# Patient Record
Sex: Female | Born: 1970 | Race: Black or African American | Hispanic: No | Marital: Single | Smoking: Current some day smoker
Health system: Southern US, Community
[De-identification: ages and names within clinical notes are randomized; demographics above are authoritative.]

## PROBLEM LIST (undated history)

## (undated) DIAGNOSIS — I340 Nonrheumatic mitral (valve) insufficiency: Secondary | ICD-10-CM

## (undated) DIAGNOSIS — I428 Other cardiomyopathies: Secondary | ICD-10-CM

## (undated) DIAGNOSIS — I5022 Chronic systolic (congestive) heart failure: Secondary | ICD-10-CM

## (undated) DIAGNOSIS — I671 Cerebral aneurysm, nonruptured: Secondary | ICD-10-CM

## (undated) DIAGNOSIS — I5020 Unspecified systolic (congestive) heart failure: Secondary | ICD-10-CM

## (undated) DIAGNOSIS — I10 Essential (primary) hypertension: Secondary | ICD-10-CM

## (undated) DIAGNOSIS — Z720 Tobacco use: Secondary | ICD-10-CM

## (undated) DIAGNOSIS — F32A Depression, unspecified: Secondary | ICD-10-CM

## (undated) DIAGNOSIS — I480 Paroxysmal atrial fibrillation: Secondary | ICD-10-CM

## (undated) HISTORY — DX: Essential (primary) hypertension: I10

---

## 1998-04-11 ENCOUNTER — Emergency Department (HOSPITAL_COMMUNITY): Admission: EM | Admit: 1998-04-11 | Discharge: 1998-04-11 | Payer: Self-pay | Admitting: Family Medicine

## 2006-11-24 ENCOUNTER — Emergency Department: Payer: Self-pay | Admitting: General Practice

## 2007-10-01 ENCOUNTER — Emergency Department: Payer: Self-pay | Admitting: Emergency Medicine

## 2007-12-16 ENCOUNTER — Emergency Department: Payer: Self-pay | Admitting: Emergency Medicine

## 2008-09-27 ENCOUNTER — Encounter: Payer: Self-pay | Admitting: Obstetrics and Gynecology

## 2008-11-01 ENCOUNTER — Encounter: Payer: Self-pay | Admitting: Obstetrics and Gynecology

## 2008-12-27 ENCOUNTER — Inpatient Hospital Stay: Payer: Self-pay

## 2009-02-15 ENCOUNTER — Emergency Department: Payer: Self-pay | Admitting: Unknown Physician Specialty

## 2009-02-16 ENCOUNTER — Emergency Department: Payer: Self-pay | Admitting: Emergency Medicine

## 2010-01-20 ENCOUNTER — Emergency Department: Payer: Self-pay | Admitting: Internal Medicine

## 2010-06-18 ENCOUNTER — Emergency Department: Payer: Self-pay | Admitting: Emergency Medicine

## 2010-11-24 IMAGING — US US OB DETAIL+14 WK - NRPT [facility]
1 series · 14 of 28 positions shown · non-contrast
Comparison: none

[Series 1: us ob detail+14 wk - nrpt [facility] · 14 of 58 slices shown]
[im 3/58]
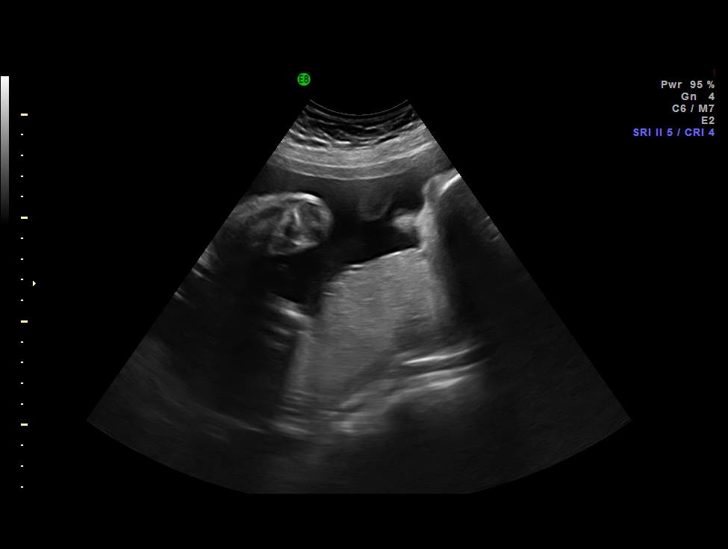
[im 7/58]
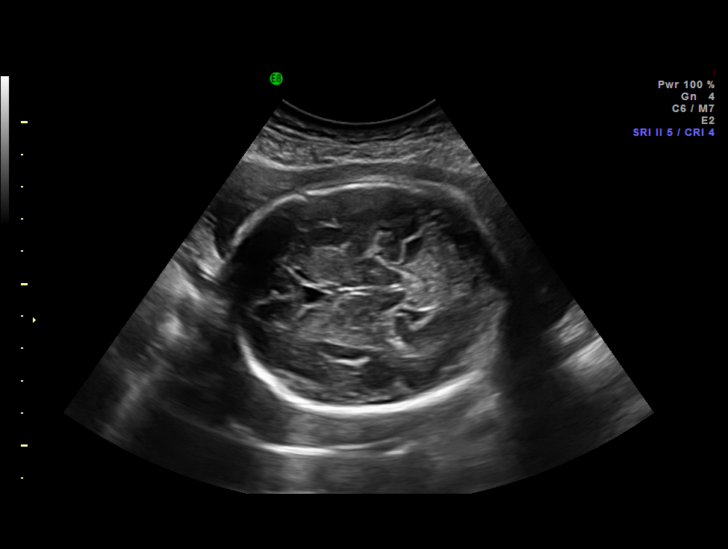
[im 11/58]
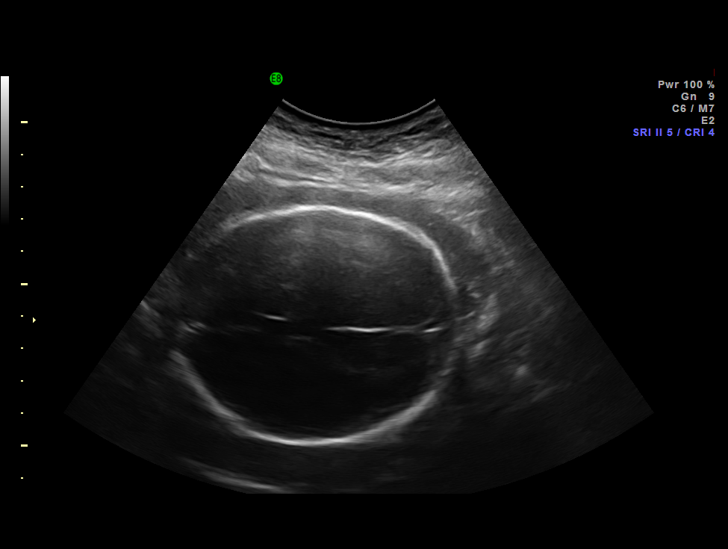
[im 15/58]
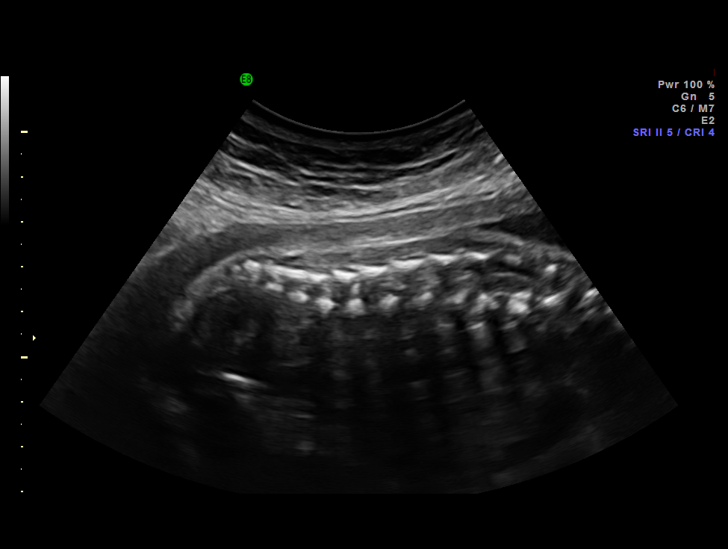
[im 20/58]
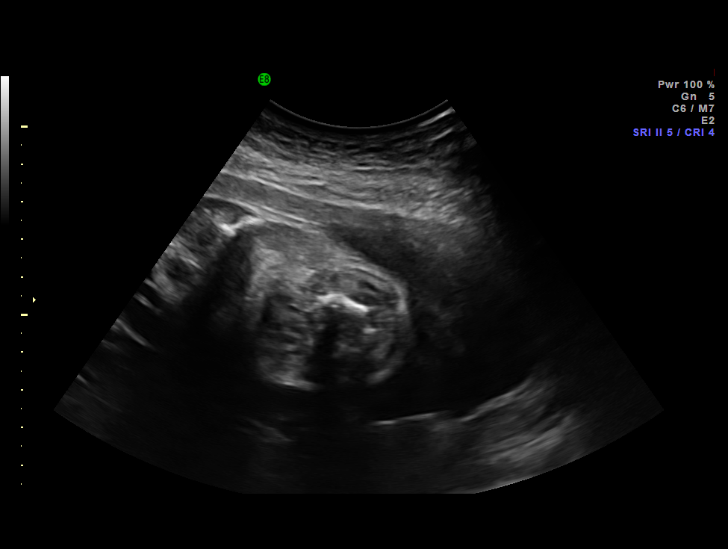
[im 24/58]
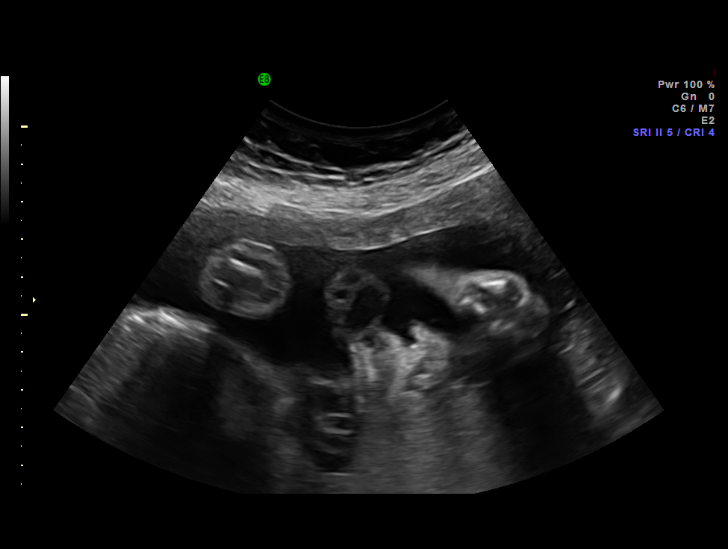
[im 28/58]
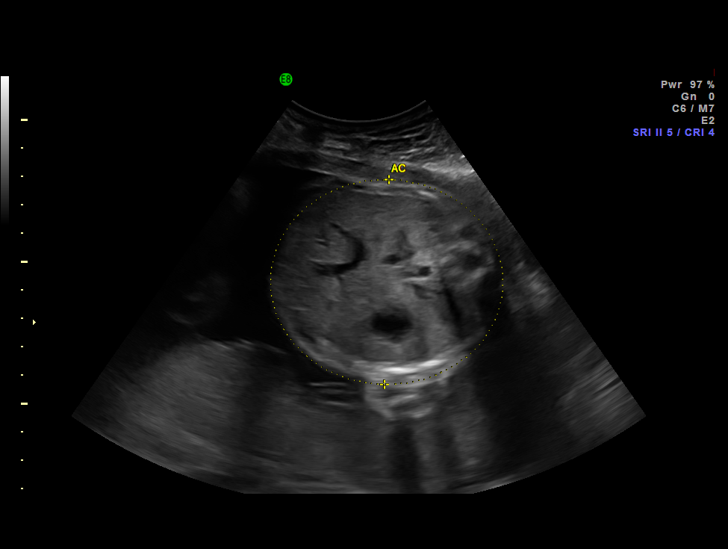
[im 32/58]
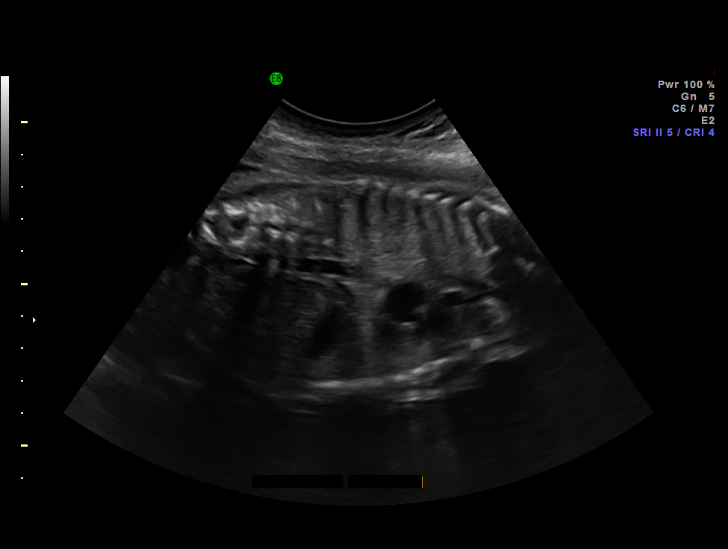
[im 36/58]
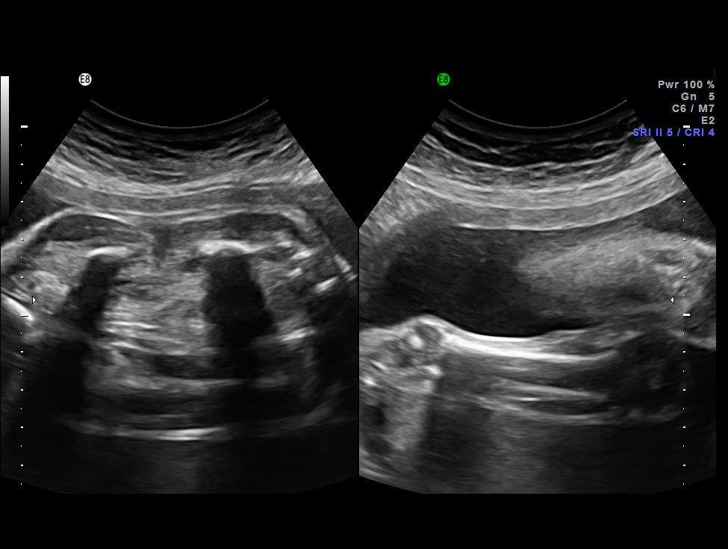
[im 41/58]
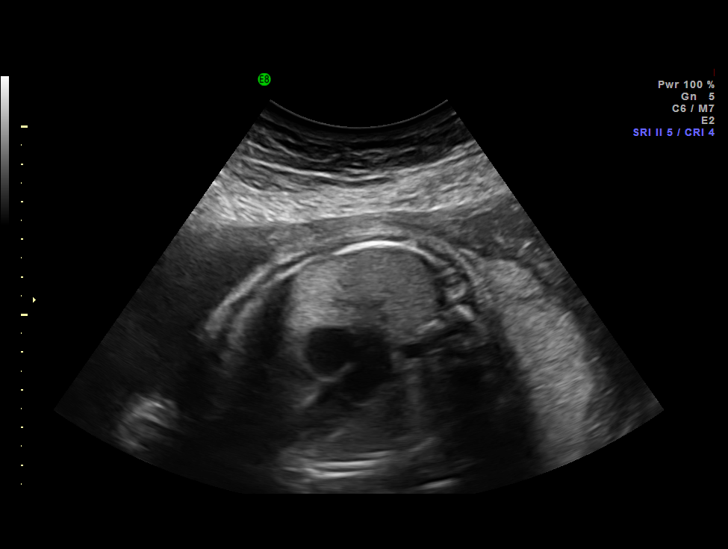
[im 45/58]
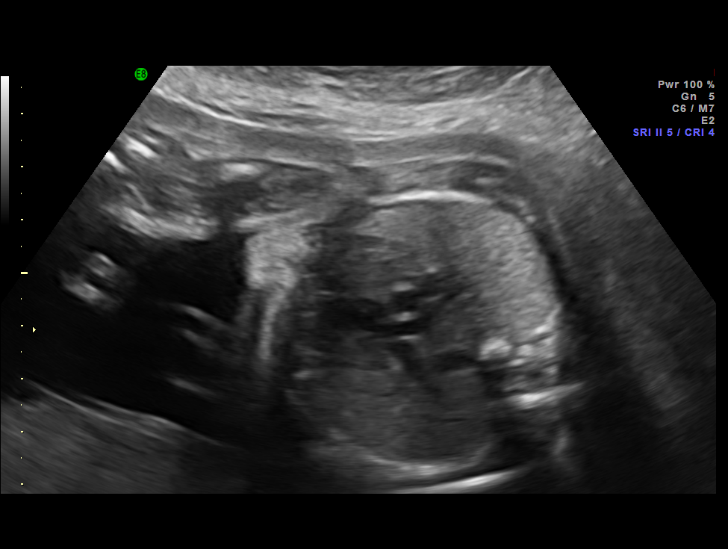
[im 49/58]
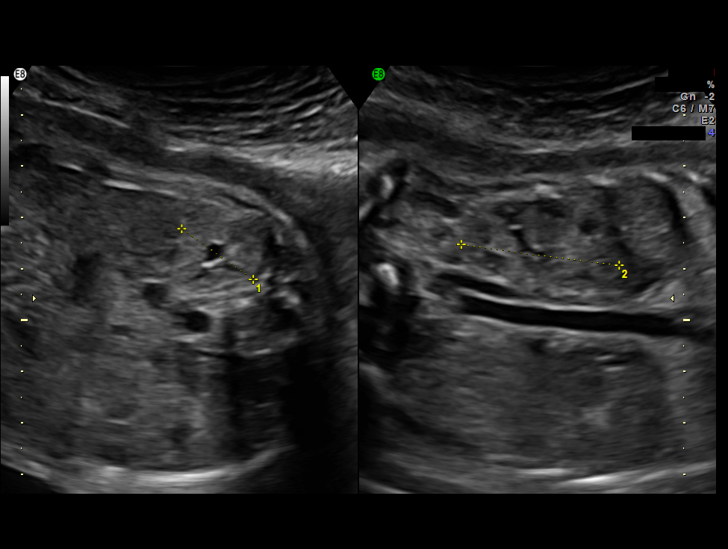
[im 53/58]
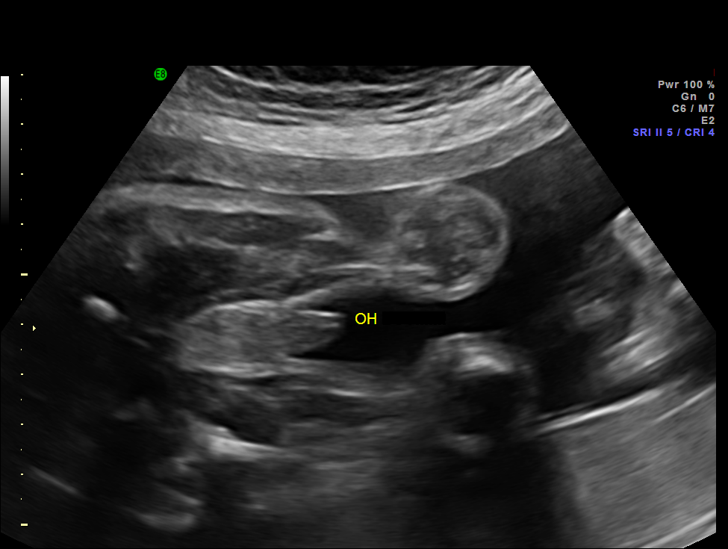
[im 58/58]
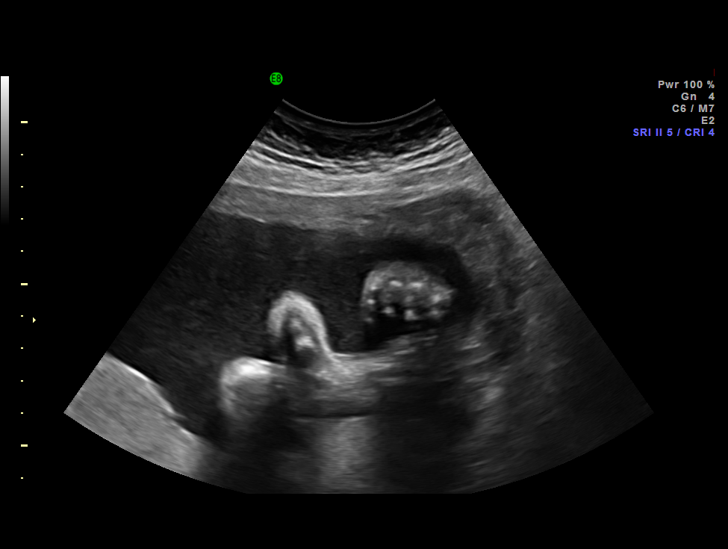

[14 of 28 positions shown; findings below may reference images not displayed]

IMAGES IMPORTED FROM THE SYNGO WORKFLOW SYSTEM
NO DICTATION FOR STUDY

## 2011-02-18 ENCOUNTER — Emergency Department: Payer: Self-pay | Admitting: Emergency Medicine

## 2011-06-29 ENCOUNTER — Emergency Department: Payer: Self-pay | Admitting: Internal Medicine

## 2011-09-02 ENCOUNTER — Emergency Department: Payer: Self-pay | Admitting: Internal Medicine

## 2011-09-02 LAB — COMPREHENSIVE METABOLIC PANEL
Albumin: 2.5 g/dL — ABNORMAL LOW (ref 3.4–5.0)
Alkaline Phosphatase: 41 U/L — ABNORMAL LOW (ref 50–136)
Anion Gap: 11 (ref 7–16)
BUN: 7 mg/dL (ref 7–18)
Bilirubin,Total: 0.2 mg/dL (ref 0.2–1.0)
Calcium, Total: 7.9 mg/dL — ABNORMAL LOW (ref 8.5–10.1)
Chloride: 105 mmol/L (ref 98–107)
Co2: 25 mmol/L (ref 21–32)
Creatinine: 0.5 mg/dL — ABNORMAL LOW (ref 0.60–1.30)
EGFR (African American): >60
EGFR (Non-African Amer.): >60
Glucose: 71 mg/dL (ref 65–99)
Osmolality: 278 (ref 275–301)
Potassium: 3.4 mmol/L — ABNORMAL LOW (ref 3.5–5.1)
SGOT(AST): 12 U/L — ABNORMAL LOW (ref 15–37)
SGPT (ALT): 16 U/L
Sodium: 141 mmol/L (ref 136–145)
Total Protein: 6.6 g/dL (ref 6.4–8.2)

## 2011-09-02 LAB — URINALYSIS, COMPLETE
Bilirubin,UR: NEGATIVE
Blood: NEGATIVE
Glucose,UR: NEGATIVE mg/dL (ref 0–75)
Ketone: NEGATIVE
Leukocyte Esterase: NEGATIVE
Nitrite: NEGATIVE
Ph: 6 (ref 4.5–8.0)
Protein: NEGATIVE
RBC,UR: 1 /HPF (ref 0–5)
Specific Gravity: 1.02 (ref 1.003–1.030)
Squamous Epithelial: 2
WBC UR: 1 /HPF (ref 0–5)

## 2011-09-02 LAB — CBC
HCT: 34.2 % — ABNORMAL LOW (ref 35.0–47.0)
HGB: 11.2 g/dL — ABNORMAL LOW (ref 12.0–16.0)
MCH: 28.6 pg (ref 26.0–34.0)
MCHC: 32.8 g/dL (ref 32.0–36.0)
MCV: 87 fL (ref 80–100)
Platelet: 168 10*3/uL (ref 150–440)
RBC: 3.92 10*6/uL (ref 3.80–5.20)
RDW: 14.8 % — ABNORMAL HIGH (ref 11.5–14.5)
WBC: 8.7 10*3/uL (ref 3.6–11.0)

## 2011-09-02 LAB — HCG, QUANTITATIVE, PREGNANCY: Beta Hcg, Quant.: 54617 m[IU]/mL — ABNORMAL HIGH

## 2011-10-11 ENCOUNTER — Encounter: Payer: Self-pay | Admitting: Maternal and Fetal Medicine

## 2012-01-11 ENCOUNTER — Observation Stay: Payer: Self-pay | Admitting: Obstetrics and Gynecology

## 2012-01-11 LAB — URINALYSIS, COMPLETE
Bacteria: NONE SEEN
Bilirubin,UR: NEGATIVE
Blood: NEGATIVE
Glucose,UR: NEGATIVE mg/dL (ref 0–75)
Ketone: NEGATIVE
Leukocyte Esterase: NEGATIVE
Nitrite: NEGATIVE
Ph: 6 (ref 4.5–8.0)
Protein: NEGATIVE
RBC,UR: <1 /HPF (ref 0–5)
Specific Gravity: 1.021 (ref 1.003–1.030)
Squamous Epithelial: 2
WBC UR: 1 /HPF (ref 0–5)

## 2012-01-11 LAB — DRUG SCREEN, URINE

## 2012-02-04 ENCOUNTER — Observation Stay: Payer: Self-pay

## 2012-02-04 LAB — PIH PROFILE
Anion Gap: 11 (ref 7–16)
BUN: 7 mg/dL (ref 7–18)
Calcium, Total: 8.1 mg/dL — ABNORMAL LOW (ref 8.5–10.1)
Chloride: 105 mmol/L (ref 98–107)
Co2: 22 mmol/L (ref 21–32)
Creatinine: 0.67 mg/dL (ref 0.60–1.30)
EGFR (African American): >60
EGFR (Non-African Amer.): >60
Glucose: 147 mg/dL — ABNORMAL HIGH (ref 65–99)
HCT: 34.3 % — ABNORMAL LOW (ref 35.0–47.0)
HGB: 11.3 g/dL — ABNORMAL LOW (ref 12.0–16.0)
MCH: 28.9 pg (ref 26.0–34.0)
MCHC: 32.9 g/dL (ref 32.0–36.0)
MCV: 88 fL (ref 80–100)
Osmolality: 276 (ref 275–301)
Platelet: 182 10*3/uL (ref 150–440)
Potassium: 3.3 mmol/L — ABNORMAL LOW (ref 3.5–5.1)
RBC: 3.9 10*6/uL (ref 3.80–5.20)
RDW: 15 % — ABNORMAL HIGH (ref 11.5–14.5)
SGOT(AST): 18 U/L (ref 15–37)
Sodium: 138 mmol/L (ref 136–145)
Uric Acid: 2.7 mg/dL (ref 2.6–6.0)
WBC: 9 10*3/uL (ref 3.6–11.0)

## 2012-02-04 LAB — PROTEIN / CREATININE RATIO, URINE
Creatinine, Urine: 144.4 mg/dL — ABNORMAL HIGH (ref 30.0–125.0)
Protein, Random Urine: 18 mg/dL — ABNORMAL HIGH (ref 0–12)
Protein/Creat. Ratio: 125 mg/gCREAT (ref 0–200)

## 2012-02-07 ENCOUNTER — Observation Stay: Payer: Self-pay

## 2012-02-26 ENCOUNTER — Inpatient Hospital Stay: Payer: Self-pay

## 2012-02-26 LAB — PIH PROFILE
Anion Gap: 7 (ref 7–16)
BUN: 6 mg/dL — ABNORMAL LOW (ref 7–18)
Calcium, Total: 8.2 mg/dL — ABNORMAL LOW (ref 8.5–10.1)
Chloride: 107 mmol/L (ref 98–107)
Co2: 24 mmol/L (ref 21–32)
Creatinine: 0.5 mg/dL — ABNORMAL LOW (ref 0.60–1.30)
EGFR (African American): >60
EGFR (Non-African Amer.): >60
Glucose: 62 mg/dL — ABNORMAL LOW (ref 65–99)
HCT: 35.6 % (ref 35.0–47.0)
HGB: 11.8 g/dL — ABNORMAL LOW (ref 12.0–16.0)
MCH: 29.2 pg (ref 26.0–34.0)
MCHC: 33.2 g/dL (ref 32.0–36.0)
MCV: 88 fL (ref 80–100)
Osmolality: 271 (ref 275–301)
Platelet: 193 10*3/uL (ref 150–440)
Potassium: 3.8 mmol/L (ref 3.5–5.1)
RBC: 4.05 10*6/uL (ref 3.80–5.20)
RDW: 15.2 % — ABNORMAL HIGH (ref 11.5–14.5)
SGOT(AST): 16 U/L (ref 15–37)
Sodium: 138 mmol/L (ref 136–145)
Uric Acid: 3.1 mg/dL (ref 2.6–6.0)
WBC: 9.3 10*3/uL (ref 3.6–11.0)

## 2012-02-26 LAB — PROTEIN / CREATININE RATIO, URINE
Creatinine, Urine: 99.1 mg/dL (ref 30.0–125.0)
Protein, Random Urine: 17 mg/dL — ABNORMAL HIGH (ref 0–12)
Protein/Creat. Ratio: 172 mg/gCREAT (ref 0–200)

## 2012-02-28 LAB — HEMATOCRIT: HCT: 31.8 % — ABNORMAL LOW (ref 35.0–47.0)

## 2013-02-15 ENCOUNTER — Emergency Department: Payer: Self-pay | Admitting: Emergency Medicine

## 2013-02-15 LAB — URINALYSIS, COMPLETE
Bacteria: NONE SEEN
Bilirubin,UR: NEGATIVE
Blood: NEGATIVE
Glucose,UR: NEGATIVE mg/dL (ref 0–75)
Ketone: NEGATIVE
Leukocyte Esterase: NEGATIVE
Nitrite: NEGATIVE
Ph: 7 (ref 4.5–8.0)
Protein: 30
RBC,UR: 1 /HPF (ref 0–5)
Specific Gravity: 1.027 (ref 1.003–1.030)
Squamous Epithelial: 7
WBC UR: 1 /HPF (ref 0–5)

## 2013-02-15 LAB — COMPREHENSIVE METABOLIC PANEL
Albumin: 2.7 g/dL — ABNORMAL LOW (ref 3.4–5.0)
Alkaline Phosphatase: 75 U/L (ref 50–136)
Anion Gap: 8 (ref 7–16)
BUN: 11 mg/dL (ref 7–18)
Bilirubin,Total: 0.2 mg/dL (ref 0.2–1.0)
Calcium, Total: 8.5 mg/dL (ref 8.5–10.1)
Chloride: 106 mmol/L (ref 98–107)
Co2: 23 mmol/L (ref 21–32)
Creatinine: 0.65 mg/dL (ref 0.60–1.30)
EGFR (African American): >60
EGFR (Non-African Amer.): >60
Glucose: 110 mg/dL — ABNORMAL HIGH (ref 65–99)
Osmolality: 274 (ref 275–301)
Potassium: 3.7 mmol/L (ref 3.5–5.1)
SGOT(AST): 11 U/L — ABNORMAL LOW (ref 15–37)
SGPT (ALT): 12 U/L (ref 12–78)
Sodium: 137 mmol/L (ref 136–145)
Total Protein: 7.2 g/dL (ref 6.4–8.2)

## 2013-02-15 LAB — CBC
HCT: 35.5 % (ref 35.0–47.0)
HGB: 11.8 g/dL — ABNORMAL LOW (ref 12.0–16.0)
MCH: 28.2 pg (ref 26.0–34.0)
MCHC: 33.1 g/dL (ref 32.0–36.0)
MCV: 85 fL (ref 80–100)
Platelet: 174 10*3/uL (ref 150–440)
RBC: 4.17 10*6/uL (ref 3.80–5.20)
RDW: 15.5 % — ABNORMAL HIGH (ref 11.5–14.5)
WBC: 10.2 10*3/uL (ref 3.6–11.0)

## 2013-02-15 LAB — LIPASE, BLOOD: Lipase: 533 U/L — ABNORMAL HIGH (ref 73–393)

## 2013-05-28 ENCOUNTER — Encounter: Payer: Self-pay | Admitting: Obstetrics & Gynecology

## 2013-06-29 ENCOUNTER — Observation Stay: Payer: Self-pay

## 2013-06-29 LAB — PIH PROFILE
Anion Gap: 9 (ref 7–16)
BUN: 7 mg/dL (ref 7–18)
Calcium, Total: 8.1 mg/dL — ABNORMAL LOW (ref 8.5–10.1)
Chloride: 107 mmol/L (ref 98–107)
Co2: 22 mmol/L (ref 21–32)
Creatinine: 0.56 mg/dL — ABNORMAL LOW (ref 0.60–1.30)
EGFR (African American): >60
EGFR (Non-African Amer.): >60
Glucose: 139 mg/dL — ABNORMAL HIGH (ref 65–99)
HCT: 32.2 % — ABNORMAL LOW (ref 35.0–47.0)
HGB: 10.7 g/dL — ABNORMAL LOW (ref 12.0–16.0)
MCH: 27.8 pg (ref 26.0–34.0)
MCHC: 33.2 g/dL (ref 32.0–36.0)
MCV: 84 fL (ref 80–100)
Osmolality: 276 (ref 275–301)
Platelet: 139 10*3/uL — ABNORMAL LOW (ref 150–440)
Potassium: 3.5 mmol/L (ref 3.5–5.1)
RBC: 3.84 10*6/uL (ref 3.80–5.20)
RDW: 15 % — ABNORMAL HIGH (ref 11.5–14.5)
SGOT(AST): 13 U/L — ABNORMAL LOW (ref 15–37)
Sodium: 138 mmol/L (ref 136–145)
Uric Acid: 3.3 mg/dL (ref 2.6–6.0)
WBC: 9.4 10*3/uL (ref 3.6–11.0)

## 2013-06-29 LAB — PROTEIN / CREATININE RATIO, URINE
Creatinine, Urine: 91.3 mg/dL (ref 30.0–125.0)
Protein, Random Urine: 11 mg/dL (ref 0–12)
Protein/Creat. Ratio: 120 mg/gCREAT (ref 0–200)

## 2013-07-16 ENCOUNTER — Inpatient Hospital Stay: Payer: Self-pay | Admitting: Obstetrics and Gynecology

## 2013-07-16 LAB — PIH PROFILE
Anion Gap: 4 — ABNORMAL LOW (ref 7–16)
BUN: 8 mg/dL (ref 7–18)
Calcium, Total: 8.4 mg/dL — ABNORMAL LOW (ref 8.5–10.1)
Chloride: 107 mmol/L (ref 98–107)
Co2: 25 mmol/L (ref 21–32)
Creatinine: 0.7 mg/dL (ref 0.60–1.30)
EGFR (African American): >60
EGFR (Non-African Amer.): >60
Glucose: 101 mg/dL — ABNORMAL HIGH (ref 65–99)
HCT: 32.8 % — ABNORMAL LOW (ref 35.0–47.0)
HGB: 11 g/dL — ABNORMAL LOW (ref 12.0–16.0)
MCH: 27.4 pg (ref 26.0–34.0)
MCHC: 33.5 g/dL (ref 32.0–36.0)
MCV: 82 fL (ref 80–100)
Osmolality: 270 (ref 275–301)
Platelet: 155 10*3/uL (ref 150–440)
Potassium: 3.8 mmol/L (ref 3.5–5.1)
RBC: 4.01 10*6/uL (ref 3.80–5.20)
RDW: 14.8 % — ABNORMAL HIGH (ref 11.5–14.5)
SGOT(AST): 14 U/L — ABNORMAL LOW (ref 15–37)
Sodium: 136 mmol/L (ref 136–145)
Uric Acid: 3.2 mg/dL (ref 2.6–6.0)
WBC: 10.7 10*3/uL (ref 3.6–11.0)

## 2013-07-16 LAB — PROTEIN / CREATININE RATIO, URINE
Creatinine, Urine: 101.1 mg/dL (ref 30.0–125.0)
Protein, Random Urine: 19 mg/dL — ABNORMAL HIGH (ref 0–12)
Protein/Creat. Ratio: 188 mg/gCREAT (ref 0–200)

## 2013-07-17 LAB — CBC WITH DIFFERENTIAL/PLATELET
Basophil #: 0.1 10*3/uL (ref 0.0–0.1)
Basophil #: 0.1 10*3/uL (ref 0.0–0.1)
Basophil %: 0.8 %
Basophil %: 0.4 %
Eosinophil #: 0.1 10*3/uL (ref 0.0–0.7)
Eosinophil #: 0 10*3/uL (ref 0.0–0.7)
Eosinophil %: 1.1 %
Eosinophil %: 0.3 %
HCT: 34.4 % — ABNORMAL LOW (ref 35.0–47.0)
HCT: 34.7 % — ABNORMAL LOW (ref 35.0–47.0)
HGB: 11.2 g/dL — ABNORMAL LOW (ref 12.0–16.0)
HGB: 11.2 g/dL — ABNORMAL LOW (ref 12.0–16.0)
Lymphocyte #: 2.7 10*3/uL (ref 1.0–3.6)
Lymphocyte #: 1.6 10*3/uL (ref 1.0–3.6)
Lymphocyte %: 21.9 %
Lymphocyte %: 10.4 %
MCH: 26.7 pg (ref 26.0–34.0)
MCH: 26.7 pg (ref 26.0–34.0)
MCHC: 32.4 g/dL (ref 32.0–36.0)
MCHC: 32.3 g/dL (ref 32.0–36.0)
MCV: 83 fL (ref 80–100)
MCV: 83 fL (ref 80–100)
Monocyte #: 1 x10 3/mm — ABNORMAL HIGH (ref 0.2–0.9)
Monocyte #: 1.1 x10 3/mm — ABNORMAL HIGH (ref 0.2–0.9)
Monocyte %: 8.4 %
Monocyte %: 7.1 %
Neutrophil #: 8.3 10*3/uL — ABNORMAL HIGH (ref 1.4–6.5)
Neutrophil #: 12.6 10*3/uL — ABNORMAL HIGH (ref 1.4–6.5)
Neutrophil %: 67.8 %
Neutrophil %: 81.8 %
Platelet: 156 10*3/uL (ref 150–440)
Platelet: 157 10*3/uL (ref 150–440)
RBC: 4.18 10*6/uL (ref 3.80–5.20)
RBC: 4.19 10*6/uL (ref 3.80–5.20)
RDW: 15.1 % — ABNORMAL HIGH (ref 11.5–14.5)
RDW: 15 % — ABNORMAL HIGH (ref 11.5–14.5)
WBC: 12.2 10*3/uL — ABNORMAL HIGH (ref 3.6–11.0)
WBC: 15.4 10*3/uL — ABNORMAL HIGH (ref 3.6–11.0)

## 2013-07-18 LAB — HEMOGLOBIN: HGB: 9.8 g/dL — ABNORMAL LOW (ref 12.0–16.0)

## 2014-08-15 ENCOUNTER — Inpatient Hospital Stay: Payer: Self-pay

## 2014-08-15 LAB — DRUG SCREEN, URINE

## 2014-08-15 LAB — COMPREHENSIVE METABOLIC PANEL
Albumin: 2.2 g/dL — ABNORMAL LOW (ref 3.4–5.0)
Alkaline Phosphatase: 255 U/L — ABNORMAL HIGH
Anion Gap: 11 (ref 7–16)
BUN: 10 mg/dL (ref 7–18)
Bilirubin,Total: 0.3 mg/dL (ref 0.2–1.0)
Calcium, Total: 9.5 mg/dL (ref 8.5–10.1)
Chloride: 102 mmol/L (ref 98–107)
Co2: 23 mmol/L (ref 21–32)
Creatinine: 0.8 mg/dL (ref 0.60–1.30)
EGFR (African American): >60
EGFR (Non-African Amer.): >60
Osmolality: 271 (ref 275–301)
Potassium: 3.8 mmol/L (ref 3.5–5.1)
SGOT(AST): 21 U/L (ref 15–37)
SGPT (ALT): 18 U/L
Sodium: 136 mmol/L (ref 136–145)
Total Protein: 7.1 g/dL (ref 6.4–8.2)

## 2014-08-15 LAB — URINALYSIS, COMPLETE
Bilirubin,UR: NEGATIVE
Blood: NEGATIVE
Glucose,UR: NEGATIVE mg/dL (ref 0–75)
Ketone: NEGATIVE
Leukocyte Esterase: NEGATIVE
Nitrite: NEGATIVE
Ph: 5 (ref 4.5–8.0)
Protein: NEGATIVE
RBC,UR: 1086 /HPF (ref 0–5)
Specific Gravity: 1.017 (ref 1.003–1.030)
Squamous Epithelial: 1
WBC UR: 1 /HPF (ref 0–5)

## 2014-08-15 LAB — PRENATAL PANEL
ABO/RH(D): AB POS
Antibody Screen: NEGATIVE
Glucose: 103 mg/dL — ABNORMAL HIGH (ref 65–99)
HCT: 38.9 % (ref 35.0–47.0)
HGB: 12.3 g/dL (ref 12.0–16.0)
MCH: 27 pg (ref 26.0–34.0)
MCHC: 31.6 g/dL — ABNORMAL LOW (ref 32.0–36.0)
MCV: 85 fL (ref 80–100)
Platelet: 189 10*3/uL (ref 150–440)
RBC: 4.56 10*6/uL (ref 3.80–5.20)
RDW: 14.4 % (ref 11.5–14.5)
WBC: 13.5 10*3/uL — ABNORMAL HIGH (ref 3.6–11.0)

## 2014-08-15 LAB — DIFFERENTIAL
Basophil #: 0.1 10*3/uL (ref 0.0–0.1)
Basophil %: 0.7 %
Eosinophil #: 0.1 10*3/uL (ref 0.0–0.7)
Eosinophil %: 1 %
Lymphocyte #: 3.3 10*3/uL (ref 1.0–3.6)
Lymphocyte %: 24.7 %
Monocyte #: 1.1 x10 3/mm — ABNORMAL HIGH (ref 0.2–0.9)
Monocyte %: 7.9 %
Neutrophil #: 8.8 10*3/uL — ABNORMAL HIGH (ref 1.4–6.5)
Neutrophil %: 65.7 %

## 2014-08-15 LAB — PROTEIN / CREATININE RATIO, URINE
Creatinine, Urine: 193.8 mg/dL — ABNORMAL HIGH (ref 30.0–125.0)
Protein, Random Urine: 235 mg/dL — ABNORMAL HIGH (ref 0–12)
Protein/Creat. Ratio: 1213 mg/gCREAT — ABNORMAL HIGH (ref 0–200)

## 2014-08-16 LAB — HEMATOCRIT: HCT: 35.8 % (ref 35.0–47.0)

## 2014-08-16 LAB — RAPID HIV SCREEN (HIV 1/2 AB+AG)

## 2014-08-16 LAB — URINE CULTURE

## 2014-08-16 LAB — GC/CHLAMYDIA PROBE AMP

## 2014-11-15 LAB — SURGICAL PATHOLOGY

## 2014-11-30 NOTE — H&P (Signed)
L&D Evaluation:  History:   HPI 44 yo [id number] with c/o "back pain and UC's becoming uncomfortable", NO ROM, VB, Decreased FM or any other S/S/. PNC at [facility] for AMA, h/o polysubstance abuse, PICA, Smoker, Late PNC, Obesity, h/o depression, genital HSV, 1st trimester spotting, Lt Bartholins cyst. LMP of 05/30/11 & EDd of 03/05/12.1 child died of SIDS.    Presents with back pain, contractions    Patient's Medical History Asthma  Depression, Genital HSV,    Medications Pre Natal Vitamins    Allergies PCN    Social History tobacco  drugs    Family History Non-Contributory   ROS:   ROS All systems were reviewed.  HEENT, CNS, GI, GU, Respiratory, CV, Renal and Musculoskeletal systems were found to be normal.   Exam:   Vital Signs stable    General no apparent distress    Mental Status clear    Chest clear    Heart normal sinus rhythm, no murmur/gallop/rubs    Abdomen gravid, non-tender    Estimated Fetal Weight Average for gestational age    Back no CVAT    Edema 1+    Reflexes 1+    Mebranes Intact    FHT normal rate with no decels, reactive NST    Ucx absent    Skin dry    Lymph no lymphadenopathy    Other Awake but, slightly inappropriate in affect. Smiling and sleeping, formerly was snoring when entering the room.   Impression:   Impression reactive NST, Eval for labor   Plan:   Plan UA, monitor contractions and for cervical change, Will dc home if urine normal    Comments UDS ordered due to affect. Antic Anti-viral at 36 weeks daily for HSV.   Electronic Signatures: Sharee PimpleJones, Caron W (CNM)  (Signed 21-Jun-13 08:58)  Authored: L&D Evaluation   Last Updated: 21-Jun-13 08:58 by Sharee PimpleJones, Caron W (CNM)

## 2014-11-30 NOTE — H&P (Signed)
L&D Evaluation:  History:  HPI 44 yo [id number] (1 died of SIDS) with PNC at [facility] significant for Grand Multip wtih elevated BP's today and sent from clinic. Initial BP in [facility] was 149-162/95 and down to a low of 130/77 to 143/78. No HA today or blurred vision, RUQ pain even though she had a HA this week.. No ROM, VB or decreased FM. [facility] No VB, decreased FM, ROM or UC's noted today. Pt is a tobacco smoker.   Presents with PIH today   Patient's Medical History ASCUS pap, choledocholithiasis w/sone in billiary tree [facility]   Medications Pre Natal Vitamins   Allergies NKDA   Social History tobacco   Family History Non-Contributory   ROS:  ROS All systems were reviewed.  HEENT, CNS, GI, GU, Respiratory, CV, Renal and Musculoskeletal systems were found to be normal.   Exam:  Vital Signs stable  BP >140/90   General no apparent distress   Mental Status clear   Chest clear   Heart normal sinus rhythm, no murmur/gallop/rubs   Abdomen gravid, non-tender   Estimated Fetal Weight Average for gestational age   Back no CVAT   Edema 1+   Reflexes 1+   Clonus negative   Pelvic not eval   Mebranes Intact   FHT normal rate with no decels, Tachycardic   Ucx absent   Skin dry   Lymph no lymphadenopathy   Impression:  Impression IUP at term for PIH eval   Plan:  Plan EFM/NST, monitor contractions and for cervical change, NST reactive and tachycardic   Electronic Signatures: Sharee PimpleJones, Bular Hickok W (CNM)  (Signed 08-Dec-14 17:54)  Authored: L&D Evaluation   Last Updated: 08-Dec-14 17:54 by Sharee PimpleJones, Cyanna Neace W (CNM)

## 2014-11-30 NOTE — H&P (Signed)
L&D Evaluation:  History:  HPI 44 yo [id number](1 died of SIDS) [facility] with c/o contractions since 8 PM. Denies LOF or VBStates is due tomorrow based on LMP 15 April 15 (It should be 1/20 based on this LMP). Patient has no prenatal care. Past hx  significant for polysubstance abuse, asthma, grand multip, Obesity,  Tobacco abuse, AMA, and PIH. Denies current headache, visual changes, N/V. Past OB HX: 1. 10/24/92 fe 6-8 at 39 weeks 2. 09/21/99 fe 6-13 40 wk 3. 12/02 SAB 4. 12/05/02 female 7-15 40 wks 5. 04/26/06 fe 6-12 40 (died of SIDS) 6. 3/09 SAB 7. 12/27/08 female 8-7 41 wk 8. 02/27/12 female 7-12 PIH 9. 07/17/13 fe 7-7 PIH   Presents with contractions   Patient's Medical History +HSV 2011,   Medications Pre Natal Vitamins   Allergies PCN, hives   Social History tobacco  drugs  Past hx of cocaine, and MJ use   Family History Non-Contributory   ROS:  ROS see HPI   Exam:  Vital Signs 156/98   General no apparent distress, breathing with some contractions   Mental Status clear   Chest clear   Heart normal sinus rhythm, no murmur/gallop/rubs   Abdomen gravid, tender with contractions   Estimated Fetal Weight Average for gestational age   Fetal Position OP   Edema no edema   Reflexes 1+   Pelvic no external lesions, 8-9/80%/-1   Mebranes Intact   FHT 155 with mod variability ?occ variable decel   Ucx q2-5 min apart   Skin dry   Other US done cephalic presentation (OP) with anterior/posterior placenta. Do not see placenta below fetal head   Impression:  Impression Grand mal tip at 40 + weeks per pt hx in advanced labor. No PNC. AMA. Hx of substance abuse-denies this pregnancy.   Plan:  Plan EFM/NST, monitor contractions and for cervical change, monitor BP, antibiotics for GBBS prophylaxis, Prenatal labs (AB POS blood type according to past records),  UDS, CMP, T&S.  IV access.  Fentanyl 50 mcg for pain.  Social services consult postpartum.   Electronic  Signatures: Trinna BalloonGutierrez, Allysha Tryon L (CNM)  (Signed 24-Jan-16 18:14)  Authored: L&D Evaluation   Last Updated: 24-Jan-16 18:14 by Trinna BalloonGutierrez, Amro Winebarger L (CNM)

## 2014-11-30 NOTE — H&P (Signed)
L&D Evaluation:  History:  HPI 8640 G7P6 presents with edema and elevated BP today. SBP 140-192 Intermittent h/a  and scotomata. Seen on L+D on 06/29/13 with Mild thrombocytopenia.[facility]  [facility]/13 on 31 week u/s.   Patient's Medical History Hypertension   Medications Pre Natal Vitamins   Allergies PCN, hives SOB   Social History tobacco  1/3 ppd   Family History Non-Contributory   ROS:  ROS All systems were reviewed.  HEENT, CNS, GI, GU, Respiratory, CV, Renal and Musculoskeletal systems were found to be normal.   Exam:  Vital Signs BP >140/90  SBP 192   General no apparent distress   Mental Status clear   Chest clear   Heart normal sinus rhythm   Abdomen gravid, non-tender   Estimated Fetal Weight efw 8.5 #   Edema 2+   Pelvic bartholin gland cyst left, cervix 1-2/70/-3   Mebranes Intact   FHT normal rate with no decels   FHT Description reassuiring   Fetal Heart Rate 140   Ucx irregular   Impression:  Impression PIH,( severe based on BP) 38+6   Plan:  Plan PIH panel, antibiotics for GBBS prophylaxis, admit, Magnesium sulfate for sz prophylaxis. labetolol for BP management   Comments Pt understands the indication for admission   Electronic Signatures: Jency Schnieders, Ihor Austinhomas J (MD)  (Signed 25-Dec-14 18:07)  Authored: L&D Evaluation   Last Updated: 25-Dec-14 18:07 by Hula Tasso, Ihor Austinhomas J (MD)

## 2014-11-30 NOTE — H&P (Signed)
L&D Evaluation:  History:   HPI 44 yo [id number] (1 died of SIDS) [facility] with "elevated BP's today at [facility] here for eval for PIH vs Pre-ex. PNC significant for polysubstance abuse, asthma, grand multip, Obesity, Lt bartholin cyst, PICA for ice, Tobacco abuse, AMA,  sent by [facility] for elevated BP's.    Presents with elevated BP's, denies HA, blurred vision, or RUQ pain    Patient's Medical History +HSV 2011,    Allergies PCN    Social History drugs  +cocaine, +MJ,   ROS:   ROS All systems were reviewed.  HEENT, CNS, GI, GU, Respiratory, CV, Renal and Musculoskeletal systems were found to be normal.   Exam:   Vital Signs BP 127/72-141/91    Urine Protein trace protein    General no apparent distress    Mental Status clear    Chest clear    Heart normal sinus rhythm, no murmur/gallop/rubs    Abdomen gravid, non-tender    Estimated Fetal Weight Average for gestational age    Back no CVAT    Edema 1+    Reflexes 1+    Clonus negative    Pelvic not evaluated    Mebranes Intact    FHT reactive NST with 2 accels 15 x 15 bpm    Ucx absent    Skin dry    Lymph no lymphadenopathy    Other Disc with Dr Logan BoresEvans and agrees that pt may have mild pre-ecclampsia. Will set up antenatal testing.   Impression:   Impression IUP at 35 5/7 weeks with elevated BP's resolved with bedrest   Plan:   Plan DC home on bedrest.    Comments Will see pt here on Thurs for NST q 72 hours. Prot/creat ratio is too low to start a 24 h urine for protein.    Follow Up Appointment Thurs here and Friday at [facility]   Electronic Signatures: Sharee PimpleJones, Azaan Leask W (CNM)  (Signed 15-Jul-13 17:40)  Authored: L&D Evaluation   Last Updated: 15-Jul-13 17:40 by Sharee PimpleJones, Judianne Seiple W (CNM)

## 2015-04-11 ENCOUNTER — Encounter: Payer: Self-pay | Admitting: *Deleted

## 2015-04-11 ENCOUNTER — Emergency Department
Admission: EM | Admit: 2015-04-11 | Discharge: 2015-04-11 | Disposition: A | Discharge status: Home / Self Care | Payer: Self-pay | Attending: Emergency Medicine | Admitting: Emergency Medicine

## 2015-04-11 DIAGNOSIS — N760 Acute vaginitis: Secondary | ICD-10-CM | POA: Insufficient documentation

## 2015-04-11 DIAGNOSIS — N390 Urinary tract infection, site not specified: Secondary | ICD-10-CM | POA: Insufficient documentation

## 2015-04-11 DIAGNOSIS — Z880 Allergy status to penicillin: Secondary | ICD-10-CM | POA: Insufficient documentation

## 2015-04-11 DIAGNOSIS — Z3202 Encounter for pregnancy test, result negative: Secondary | ICD-10-CM | POA: Insufficient documentation

## 2015-04-11 DIAGNOSIS — Z720 Tobacco use: Secondary | ICD-10-CM | POA: Insufficient documentation

## 2015-04-11 DIAGNOSIS — B9689 Other specified bacterial agents as the cause of diseases classified elsewhere: Secondary | ICD-10-CM

## 2015-04-11 LAB — URINALYSIS COMPLETE WITH MICROSCOPIC (ARMC ONLY)
Bacteria, UA: NONE SEEN
Bilirubin Urine: NEGATIVE
Glucose, UA: NEGATIVE mg/dL
Hgb urine dipstick: NEGATIVE
Ketones, ur: NEGATIVE mg/dL
Nitrite: NEGATIVE
Protein, ur: NEGATIVE mg/dL
Specific Gravity, Urine: 1.024 (ref 1.005–1.030)
pH: 5 (ref 5.0–8.0)

## 2015-04-11 LAB — WET PREP, GENITAL
Trich, Wet Prep: NEGATIVE — AB
Yeast Wet Prep HPF POC: NEGATIVE — AB

## 2015-04-11 LAB — CHLAMYDIA/NGC RT PCR (ARMC ONLY)
Chlamydia Tr: NOT DETECTED
N gonorrhoeae: NOT DETECTED

## 2015-04-11 LAB — POCT PREGNANCY, URINE: Preg Test, Ur: NEGATIVE

## 2015-04-11 MED ORDER — DOXYCYCLINE HYCLATE 100 MG PO TABS
100.0000 mg | ORAL_TABLET | Freq: Once | ORAL | Status: AC
  Administered 2015-04-11: 100 mg via ORAL
  Filled 2015-04-11: qty 1

## 2015-04-11 MED ORDER — DOXYCYCLINE HYCLATE 50 MG PO CAPS: 100.0000 mg | ORAL_CAPSULE | Freq: Two times a day (BID) | ORAL | Status: DC

## 2015-04-11 MED ORDER — METRONIDAZOLE 500 MG PO TABS: 500.0000 mg | ORAL_TABLET | Freq: Two times a day (BID) | ORAL | Status: DC

## 2015-04-11 MED ORDER — METRONIDAZOLE 500 MG PO TABS
500.0000 mg | ORAL_TABLET | Freq: Once | ORAL | Status: AC
  Administered 2015-04-11: 500 mg via ORAL
  Filled 2015-04-11: qty 1

## 2015-04-11 MED ORDER — CIPROFLOXACIN HCL 500 MG PO TABS
500.0000 mg | ORAL_TABLET | Freq: Once | ORAL | Status: AC
  Administered 2015-04-11: 500 mg via ORAL
  Filled 2015-04-11: qty 1

## 2015-04-11 NOTE — ED Notes (Signed)
Pregnancy test results (POCT) were negative. Test Acceptable

## 2015-04-11 NOTE — ED Notes (Signed)
Pt c/o urinary frequency and urgency which has progressed to incontinence w/o pain. Pt states urine has a strong smell and is cloudy. Pt c/o light discharge. Pt does not know if she is pregnant but it is possible. Pt denies n/v/d and fever at this time. Pt c/o lower back pain when her sxs first started but that resolved. Pt c/o fatigue accompanying other sxs.

## 2015-04-11 NOTE — Discharge Instructions (Signed)
1. Take antibiotics as prescribed: Doxycycline 100 mg twice daily 7 days Flagyl 500 mg twice daily 7 days 2. Return to the ER for worsening symptoms, fever, persistent vomiting or other concerns.  Urinary Tract Infection Urinary tract infections (UTIs) can develop anywhere along your urinary tract. Your urinary tract is your body's drainage system for removing wastes and extra water. Your urinary tract includes two kidneys, two ureters, a bladder, and a urethra. Your kidneys are a pair of bean-shaped organs. Each kidney is about the size of your fist. They are located below your ribs, one on each side of your spine. CAUSES Infections are caused by microbes, which are microscopic organisms, including fungi, viruses, and bacteria. These organisms are so small that they can only be seen through a microscope. Bacteria are the microbes that most commonly cause UTIs. SYMPTOMS  Symptoms of UTIs may vary by age and gender of the patient and by the location of the infection. Symptoms in young women typically include a frequent and intense urge to urinate and a painful, burning feeling in the bladder or urethra during urination. Older women and men are more likely to be tired, shaky, and weak and have muscle aches and abdominal pain. A fever may mean the infection is in your kidneys. Other symptoms of a kidney infection include pain in your back or sides below the ribs, nausea, and vomiting. DIAGNOSIS To diagnose a UTI, your caregiver will ask you about your symptoms. Your caregiver also will ask to provide a urine sample. The urine sample will be tested for bacteria and white blood cells. White blood cells are made by your body to help fight infection. TREATMENT  Typically, UTIs can be treated with medication. Because most UTIs are caused by a bacterial infection, they usually can be treated with the use of antibiotics. The choice of antibiotic and length of treatment depend on your symptoms and the type of  bacteria causing your infection. HOME CARE INSTRUCTIONS  If you were prescribed antibiotics, take them exactly as your caregiver instructs you. Finish the medication even if you feel better after you have only taken some of the medication.  Drink enough water and fluids to keep your urine clear or pale yellow.  Avoid caffeine, tea, and carbonated beverages. They tend to irritate your bladder.  Empty your bladder often. Avoid holding urine for long periods of time.  Empty your bladder before and after sexual intercourse.  After a bowel movement, women should cleanse from front to back. Use each tissue only once. SEEK MEDICAL CARE IF:   You have back pain.  You develop a fever.  Your symptoms do not begin to resolve within 3 days. SEEK IMMEDIATE MEDICAL CARE IF:   You have severe back pain or lower abdominal pain.  You develop chills.  You have nausea or vomiting.  You have continued burning or discomfort with urination. MAKE SURE YOU:   Understand these instructions.  Will watch your condition.  Will get help right away if you are not doing well or get worse. Document Released: 04/18/2005 Document Revised: 01/08/2012 Document Reviewed: 08/17/2011 [id number] Patient Information 2015 [place], [place]. This information is not intended to replace advice given to you by your health care provider. Make sure you discuss any questions you have with your health care provider.  Bacterial Vaginosis Bacterial vaginosis is a vaginal infection that occurs when the normal balance of bacteria in the vagina is disrupted. It results from an overgrowth of certain bacteria. This is the  most common vaginal infection in women of childbearing age. Treatment is important to prevent complications, especially in pregnant women, as it can cause a premature delivery. CAUSES  Bacterial vaginosis is caused by an increase in harmful bacteria that are normally present in smaller amounts in the vagina. Several  different kinds of bacteria can cause bacterial vaginosis. However, the reason that the condition develops is not fully understood. RISK FACTORS Certain activities or behaviors can put you at an increased risk of developing bacterial vaginosis, including:  Having a new sex partner or multiple sex partners.  Douching.  Using an intrauterine device (IUD) for contraception. Women do not get bacterial vaginosis from toilet seats, bedding, swimming pools, or contact with objects around them. SIGNS AND SYMPTOMS  Some women with bacterial vaginosis have no signs or symptoms. Common symptoms include:  Grey vaginal discharge.  A fishlike odor with discharge, especially after sexual intercourse.  Itching or burning of the vagina and vulva.  Burning or pain with urination. DIAGNOSIS  Your health care provider will take a medical history and examine the vagina for signs of bacterial vaginosis. A sample of vaginal fluid may be taken. Your health care provider will look at this sample under a microscope to check for bacteria and abnormal cells. A vaginal pH test may also be done.  TREATMENT  Bacterial vaginosis may be treated with antibiotic medicines. These may be given in the form of a pill or a vaginal cream. A second round of antibiotics may be prescribed if the condition comes back after treatment.  HOME CARE INSTRUCTIONS   Only take over-the-counter or prescription medicines as directed by your health care provider.  If antibiotic medicine was prescribed, take it as directed. Make sure you finish it even if you start to feel better.  Do not have sex until treatment is completed.  Tell all sexual partners that you have a vaginal infection. They should see their health care provider and be treated if they have problems, such as a mild rash or itching.  Practice safe sex by using condoms and only having one sex partner. SEEK MEDICAL CARE IF:   Your symptoms are not improving after 3 days of  treatment.  You have increased discharge or pain.  You have a fever. MAKE SURE YOU:   Understand these instructions.  Will watch your condition.  Will get help right away if you are not doing well or get worse. FOR MORE INFORMATION  Centers for Disease Control and Prevention, Division of STD Prevention: SolutionApps.co.zawww.cdc.gov/std American Sexual Health Association (ASHA): www.ashastd.org  Document Released: 07/09/2005 Document Revised: 04/29/2013 Document Reviewed: 02/18/2013 [facility] Patient Information 2015 [place]. This information is not intended to replace advice given to you by your health care provider. Make sure you discuss any questions you have with your health care provider.

## 2015-04-11 NOTE — ED Provider Notes (Signed)
[facility] Emergency Department Provider Note  ____________________________________________  Time seen: Approximately 2:03 AM  I have reviewed the triage vital signs and the nursing notes.   HISTORY  Chief Complaint Dysuria and Urinary Incontinence    HPI Sandra Hines is a 44 y.o. female who presents to the ED from home with a chief complain of urinary frequency, urgency and incontinence. Symptoms have been ongoing for several days. Complains of strong smell to urine and cloudy appearance of urine. Patient also complains of light vaginal discharge. She is concerned for STD; states she is monogamous with one man but is concerned he has been sleeping with another woman. Denies fever, chills, chest pain, shortness of breath, abdominal pain, nausea, vomiting, diarrhea.   Past Medical history None   There are no active problems to display for this patient.   History reviewed. No pertinent past surgical history.  No current outpatient prescriptions on file.  Allergies Penicillins  History reviewed. No pertinent family history.  Social History Social History  Substance Use Topics  . Smoking status: Current Every Day Smoker  . Smokeless tobacco: Never Used  . Alcohol Use: No    Review of Systems Constitutional: No fever/chills Eyes: No visual changes. ENT: No sore throat. Cardiovascular: Denies chest pain. Respiratory: Denies shortness of breath. Gastrointestinal: No abdominal pain.  No nausea, no vomiting.  No diarrhea.  No constipation. Genitourinary: Positive for urinary frequency, urgency and incontinence. Musculoskeletal: Negative for back pain. Skin: Negative for rash. Neurological: Negative for headaches, focal weakness or numbness.  10-point ROS otherwise negative.  ____________________________________________   PHYSICAL EXAM:  VITAL SIGNS: ED Triage Vitals  Enc Vitals Group     BP 04/11/15 0114 160/101 mmHg     Pulse Rate  04/11/15 0114 94     Resp 04/11/15 0114 20     Temp 04/11/15 0114 97.7 F (36.5 C)     Temp Source 04/11/15 0114 Oral     SpO2 04/11/15 0114 97 %     Weight --      Height --      Head Cir --      Peak Flow --      Pain Score --      Pain Loc --      Pain Edu? --      Excl. in GC? --     Constitutional: Alert and oriented. Well appearing and in no acute distress. Eyes: Conjunctivae are normal. PERRL. EOMI. Head: Atraumatic. Nose: No congestion/rhinnorhea. Mouth/Throat: Mucous membranes are moist.  Oropharynx non-erythematous. Neck: No stridor.   Cardiovascular: Normal rate, regular rhythm. Grossly normal heart sounds.  Good peripheral circulation. Respiratory: Normal respiratory effort.  No retractions. Lungs CTAB. Gastrointestinal: Soft and nontender. No distention. No abdominal bruits. No CVA tenderness. Musculoskeletal: No lower extremity tenderness nor edema.  No joint effusions. Neurologic:  Normal speech and language. No gross focal neurologic deficits are appreciated. No gait instability. Skin:  Skin is warm, dry and intact. No rash noted. Psychiatric: Mood and affect are normal. Speech and behavior are normal.  ____________________________________________   LABS (all labs ordered are listed, but only abnormal results are displayed)  Labs Reviewed  WET PREP, GENITAL - Abnormal; Notable for the following:    Yeast Wet Prep HPF POC NEGATIVE (*)    Trich, Wet Prep NEGATIVE (*)    Clue Cells Wet Prep HPF POC MODERATE (*)    WBC, Wet Prep HPF POC MODERATE (*)    All other components  within normal limits  URINALYSIS COMPLETEWITH MICROSCOPIC ([facility] ONLY) - Abnormal; Notable for the following:    Color, Urine YELLOW (*)    APPearance CLEAR (*)    Leukocytes, UA 2+ (*)    Squamous Epithelial / LPF 0-5 (*)    All other components within normal limits  CHLAMYDIA/NGC RT PCR ([facility] ONLY)  POC URINE PREG, ED  POCT PREGNANCY, URINE    ____________________________________________  EKG  None ____________________________________________  RADIOLOGY  None ____________________________________________   PROCEDURES  Procedure(s) performed:   Pelvic exam: External exam within normal limits without rash, lesions or vesicles. Speculum exam reveals mild white discharge. Cervical os closed. No vaginal bleeding. Bimanual exam within normal limits.  Critical Care performed: No  ____________________________________________   INITIAL IMPRESSION / ASSESSMENT AND PLAN / ED COURSE  Pertinent labs & imaging results that were available during my care of the patient were reviewed by me and considered in my medical decision making (see chart for details).  44 year old female who presents with frequency, urgency with urinalysis consistent with UTI. Empiric treatment for STD given due to patient concerns tricked return precautions given. Patient verbalizes understanding and agrees with plan of care.  ----------------------------------------- 2:57 AM on 04/11/2015 -----------------------------------------  Updated patient of wet prep results. Empiric antibiotics for STDs started in the emergency department and patient will continue doxycycline plus flagyl for 7 days. Strict return precautions given. Patient verbalizes understanding and agrees with plan of care.  ----------------------------------------- 6:17 AM on 04/11/2015 -----------------------------------------  Upon chart review, negative GC/Chlamydia noted. ____________________________________________   FINAL CLINICAL IMPRESSION(S) / ED DIAGNOSES  Final diagnoses:  UTI (lower urinary tract infection)  Bacterial vaginosis        Irean Hong, MD 04/11/15 [id number]

## 2015-06-23 ENCOUNTER — Emergency Department
Admission: EM | Admit: 2015-06-23 | Discharge: 2015-06-24 | Disposition: A | Discharge status: Home / Self Care | Payer: Self-pay | Attending: Emergency Medicine | Admitting: Emergency Medicine

## 2015-06-23 ENCOUNTER — Encounter: Payer: Self-pay | Admitting: *Deleted

## 2015-06-23 DIAGNOSIS — J069 Acute upper respiratory infection, unspecified: Secondary | ICD-10-CM | POA: Insufficient documentation

## 2015-06-23 DIAGNOSIS — F17200 Nicotine dependence, unspecified, uncomplicated: Secondary | ICD-10-CM | POA: Insufficient documentation

## 2015-06-23 DIAGNOSIS — Z792 Long term (current) use of antibiotics: Secondary | ICD-10-CM | POA: Insufficient documentation

## 2015-06-23 DIAGNOSIS — Z79899 Other long term (current) drug therapy: Secondary | ICD-10-CM | POA: Insufficient documentation

## 2015-06-23 DIAGNOSIS — J0100 Acute maxillary sinusitis, unspecified: Secondary | ICD-10-CM | POA: Insufficient documentation

## 2015-06-23 DIAGNOSIS — J029 Acute pharyngitis, unspecified: Secondary | ICD-10-CM

## 2015-06-23 DIAGNOSIS — Z880 Allergy status to penicillin: Secondary | ICD-10-CM | POA: Insufficient documentation

## 2015-06-23 LAB — POCT RAPID STREP A: Streptococcus, Group A Screen (Direct): NEGATIVE

## 2015-06-23 NOTE — ED Notes (Signed)
Pt has bilateral earache and sore throat.  Sx for 2 days.

## 2015-06-23 NOTE — ED Notes (Signed)
POCT strep Negative.

## 2015-06-24 MED ORDER — OXYMETAZOLINE HCL 0.05 % NA SOLN
1.0000 | Freq: Once | NASAL | Status: AC
  Administered 2015-06-24: 1 via NASAL
  Filled 2015-06-24: qty 15

## 2015-06-24 NOTE — ED Provider Notes (Signed)
[facility] Emergency Department Provider Note  Time seen: 12:17 AM  I have reviewed the triage vital signs and the nursing notes.   HISTORY  Chief Complaint Otalgia    HPI Sandra Hines is a 44 y.o. female with no past medical history who presents the emergency department with congestion and right ear pain. According to the patient for the past 3 days she has had a sore throat, nasal congestion, sinus pain/pressure and right ear pain. Describes her symptoms as mild to moderate. She is also noted a dry cough for the past 2 days. Denies fever. Denies chest pain or shortness of breath.     No past medical history on file.  There are no active problems to display for this patient.   No past surgical history on file.  Current Outpatient Rx  Name  Route  Sig  Dispense  Refill  . doxycycline (VIBRAMYCIN) 50 MG capsule   Oral   Take 2 capsules (100 mg total) by mouth 2 (two) times daily.   28 capsule   0   . metroNIDAZOLE (FLAGYL) 500 MG tablet   Oral   Take 1 tablet (500 mg total) by mouth 2 (two) times daily.   14 tablet   0     Allergies Penicillins  No family history on file.  Social History Social History  Substance Use Topics  . Smoking status: Current Every Day Smoker  . Smokeless tobacco: Never Used  . Alcohol Use: No    Review of Systems Constitutional: Negative for fever. ENT: Positive nasal congestion and sinus pain/pressure. Positive right ear pain. Positive sore throat. Cardiovascular: Negative for chest pain. Respiratory: Negative for shortness of breath. Gastrointestinal: Negative for abdominal pain Musculoskeletal: Negative for back pain Neurological: Negative for headache 10-point ROS otherwise negative.  ____________________________________________   PHYSICAL EXAM:  VITAL SIGNS: ED Triage Vitals  Enc Vitals Group     BP 06/23/15 2335 143/94 mmHg     Pulse Rate 06/23/15 2335 89     Resp 06/23/15 2335 20      Temp 06/23/15 2335 98.2 F (36.8 C)     Temp src --      SpO2 06/23/15 2335 100 %     Weight 06/23/15 2335 230 lb (104.327 kg)     Height 06/23/15 2335  (1.702 m)     Head Cir --      Peak Flow --      Pain Score 06/23/15 2337 9     Pain Loc --      Pain Edu? --      Excl. in GC? --     Constitutional: Alert and oriented. Well appearing and in no distress. Eyes: Normal exam ENT   Head: Normocephalic and atraumatic.   Nose: Mild rhinorrhea. Mild congestion. Moderate left maxillary sinus tenderness to percussion.   Mouth/Throat: Mild pharyngeal erythema without exudate. Cardiovascular: Normal rate, regular rhythm. No murmur Respiratory: Normal respiratory effort without tachypnea nor retractions. Breath sounds are clear  Gastrointestinal: Soft and nontender. No distention.  Musculoskeletal: Nontender with normal range of motion in all extremities. Neurologic:  Normal speech and language. No gross focal neurologic deficits  Skin:  Skin is warm, dry and intact.  Psychiatric: Mood and affect are normal. Speech and behavior are normal. ____________________________________________    INITIAL IMPRESSION / ASSESSMENT AND PLAN / ED COURSE  Pertinent labs & imaging results that were available during my care of the patient were reviewed by me and  considered in my medical decision making (see chart for details).  Patient presents for what appears to be a viral illness. Strep swab is negative. Tympanic membranes are normal, mild pharyngeal erythema, moderate left maxillary sinus tenderness to palpation/percussion. We will start the patient on Afrin twice a day for the next 5 days, Tylenol/Motrin at home. Patient is to follow up with her primary care physician if not improved within 5-7 days. Patient agreeable.  ____________________________________________   FINAL CLINICAL IMPRESSION(S) / ED DIAGNOSES  Sinusitis Pharyngitis URI    Minna AntisKevin Essense Bousquet, MD 06/24/15 0020

## 2015-06-24 NOTE — Discharge Instructions (Signed)
Please use your prescribed Afrin twice daily for the next 5 days. Please use Tylenol or Motrin at home as needed for discomfort or fever. Please follow up with her primary care physician next week if not improved. To the emergency department for any personally concerning symptoms.    Sinusitis, Adult Sinusitis is redness, soreness, and inflammation of the paranasal sinuses. Paranasal sinuses are air pockets within the bones of your face. They are located beneath your eyes, in the middle of your forehead, and above your eyes. In healthy paranasal sinuses, mucus is able to drain out, and air is able to circulate through them by way of your nose. However, when your paranasal sinuses are inflamed, mucus and air can become trapped. This can allow bacteria and other germs to grow and cause infection. Sinusitis can develop quickly and last only a short time (acute) or continue over a long period (chronic). Sinusitis that lasts for more than 12 weeks is considered chronic. CAUSES Causes of sinusitis include:  Allergies.  Structural abnormalities, such as displacement of the cartilage that separates your nostrils (deviated septum), which can decrease the air flow through your nose and sinuses and affect sinus drainage.  Functional abnormalities, such as when the small hairs (cilia) that line your sinuses and help remove mucus do not work properly or are not present. SIGNS AND SYMPTOMS Symptoms of acute and chronic sinusitis are the same. The primary symptoms are pain and pressure around the affected sinuses. Other symptoms include:  Upper toothache.  Earache.  Headache.  Bad breath.  Decreased sense of smell and taste.  A cough, which worsens when you are lying flat.  Fatigue.  Fever.  Thick drainage from your nose, which often is green and may contain pus (purulent).  Swelling and warmth over the affected sinuses. DIAGNOSIS Your health care provider will perform a physical exam. During  your exam, your health care provider may perform any of the following to help determine if you have acute sinusitis or chronic sinusitis:  Look in your nose for signs of abnormal growths in your nostrils (nasal polyps).  Tap over the affected sinus to check for signs of infection.  View the inside of your sinuses using an imaging device that has a light attached (endoscope). If your health care provider suspects that you have chronic sinusitis, one or more of the following tests may be recommended:  Allergy tests.  Nasal culture. A sample of mucus is taken from your nose, sent to a lab, and screened for bacteria.  Nasal cytology. A sample of mucus is taken from your nose and examined by your health care provider to determine if your sinusitis is related to an allergy. TREATMENT Most cases of acute sinusitis are related to a viral infection and will resolve on their own within 10 days. Sometimes, medicines are prescribed to help relieve symptoms of both acute and chronic sinusitis. These may include pain medicines, decongestants, nasal steroid sprays, or saline sprays. However, for sinusitis related to a bacterial infection, your health care provider will prescribe antibiotic medicines. These are medicines that will help kill the bacteria causing the infection. Rarely, sinusitis is caused by a fungal infection. In these cases, your health care provider will prescribe antifungal medicine. For some cases of chronic sinusitis, surgery is needed. Generally, these are cases in which sinusitis recurs more than 3 times per year, despite other treatments. HOME CARE INSTRUCTIONS  Drink plenty of water. Water helps thin the mucus so your sinuses can drain more  easily.  Use a humidifier.  Inhale steam 3-4 times a day (for example, sit in the bathroom with the shower running).  Apply a warm, moist washcloth to your face 3-4 times a day, or as directed by your health care provider.  Use saline nasal  sprays to help moisten and clean your sinuses.  Take medicines only as directed by your health care provider.  If you were prescribed either an antibiotic or antifungal medicine, finish it all even if you start to feel better. SEEK IMMEDIATE MEDICAL CARE IF:  You have increasing pain or severe headaches.  You have nausea, vomiting, or drowsiness.  You have swelling around your face.  You have vision problems.  You have a stiff neck.  You have difficulty breathing.   This information is not intended to replace advice given to you by your health care provider. Make sure you discuss any questions you have with your health care provider.   Document Released: 07/09/2005 Document Revised: 07/30/2014 Document Reviewed: 07/24/2011 Elsevier Interactive Patient Education [employer]

## 2015-06-26 LAB — CULTURE, GROUP A STREP (THRC)

## 2015-07-07 ENCOUNTER — Emergency Department
Admission: EM | Admit: 2015-07-07 | Discharge: 2015-07-07 | Disposition: A | Discharge status: Home / Self Care | Payer: Self-pay | Attending: Emergency Medicine | Admitting: Emergency Medicine

## 2015-07-07 DIAGNOSIS — R030 Elevated blood-pressure reading, without diagnosis of hypertension: Secondary | ICD-10-CM | POA: Insufficient documentation

## 2015-07-07 DIAGNOSIS — Z792 Long term (current) use of antibiotics: Secondary | ICD-10-CM | POA: Insufficient documentation

## 2015-07-07 DIAGNOSIS — J3503 Chronic tonsillitis and adenoiditis: Secondary | ICD-10-CM | POA: Insufficient documentation

## 2015-07-07 DIAGNOSIS — F17200 Nicotine dependence, unspecified, uncomplicated: Secondary | ICD-10-CM | POA: Insufficient documentation

## 2015-07-07 DIAGNOSIS — Z880 Allergy status to penicillin: Secondary | ICD-10-CM | POA: Insufficient documentation

## 2015-07-07 LAB — POCT RAPID STREP A: Streptococcus, Group A Screen (Direct): NEGATIVE

## 2015-07-07 MED ORDER — METHYLPREDNISOLONE 4 MG PO TBPK: ORAL_TABLET | ORAL | Status: DC

## 2015-07-07 MED ORDER — DIPHENHYDRAMINE HCL 12.5 MG/5ML PO SYRP: 12.5000 mg | ORAL_SOLUTION | Freq: Four times a day (QID) | ORAL | Status: DC | PRN

## 2015-07-07 MED ORDER — LIDOCAINE VISCOUS 2 % MT SOLN: 5.0000 mL | Freq: Four times a day (QID) | OROMUCOSAL | Status: DC | PRN

## 2015-07-07 NOTE — Discharge Instructions (Signed)

## 2015-07-07 NOTE — ED Notes (Signed)
MD at bedside. 

## 2015-07-07 NOTE — ED Provider Notes (Signed)
[facility] Emergency Department Provider Note  ____________________________________________  Time seen: Approximately 5:27 PM  I have reviewed the triage vital signs and the nursing notes.   HISTORY  Chief Complaint Sore Throat    HPI Sandra Hines is a 44 y.o. female complaining of sore throat for 2 weeks. Patient state he was seen was seen 2 weeks ago given Afrin also supportive care. Patient states she has a history of recurrent strep and edematous tonsils. She states she's been advised in the past to follow-up with ENT clinic but has not complied. Patient denies any other URI signs and symptoms i.e. fever chills nausea vomiting diarrhea. Patient rates her pain discomfort as a 9/10.   History reviewed. No pertinent past medical history.  There are no active problems to display for this patient.   History reviewed. No pertinent past surgical history.  Current Outpatient Rx  Name  Route  Sig  Dispense  Refill  . diphenhydrAMINE (BENYLIN) 12.5 MG/5ML syrup   Oral   Take 5 mLs (12.5 mg total) by mouth 4 (four) times daily as needed for allergies. 5 mL of viscous lidocaine for swish and swallow.   118 mL   0   . doxycycline (VIBRAMYCIN) 50 MG capsule   Oral   Take 2 capsules (100 mg total) by mouth 2 (two) times daily.   28 capsule   0   . lidocaine (XYLOCAINE) 2 % solution   Mouth/Throat   Use as directed 5 mLs in the mouth or throat every 6 (six) hours as needed for mouth pain. Mixed with 5 mL of Benadryl for swish and swallow.   100 mL   0   . methylPREDNISolone (MEDROL DOSEPAK) 4 MG TBPK tablet      Take Tapered dose as directed   21 tablet   0   . metroNIDAZOLE (FLAGYL) 500 MG tablet   Oral   Take 1 tablet (500 mg total) by mouth 2 (two) times daily.   14 tablet   0     Allergies Penicillins  No family history on file.  Social History Social History  Substance Use Topics  . Smoking status: Current Every Day Smoker  .  Smokeless tobacco: Never Used  . Alcohol Use: No    Review of Systems Constitutional: No fever/chills Eyes: No visual changes. ENT: Sore throat Cardiovascular: Denies chest pain. Respiratory: Denies shortness of breath. Gastrointestinal: No abdominal pain.  No nausea, no vomiting.  No diarrhea.  No constipation. Genitourinary: Negative for dysuria. Musculoskeletal: Negative for back pain. Skin: Negative for rash. Neurological: Negative for headaches, focal weakness or numbness. Hematological/Lymphatic: Allergic/Immunilogical: Penicillin 10-point ROS otherwise negative.  ____________________________________________   PHYSICAL EXAM:  VITAL SIGNS: ED Triage Vitals  Enc Vitals Group     BP 07/07/15 1716 154/94 mmHg     Pulse Rate 07/07/15 1716 82     Resp 07/07/15 1716 20     Temp 07/07/15 1716 98.1 F (36.7 C)     Temp Source 07/07/15 1716 Oral     SpO2 07/07/15 1716 99 %     Weight 07/07/15 1716 233 lb (105.688 kg)     Height 07/07/15 1716  (1.702 m)     Head Cir --      Peak Flow --      Pain Score 07/07/15 1722 9     Pain Loc --      Pain Edu? --      Excl. in GC? --  Constitutional: Alert and oriented. Well appearing and in no acute distress. Eyes: Conjunctivae are normal. PERRL. EOMI. Head: Atraumatic. Nose: No congestion/rhinnorhea. Mouth/Throat: Mucous membranes are moist.  Oropharynx non-erythematous. Edematous bilateral tonsils without signs of exudate. Neck: No stridor.  No cervical spine tenderness to palpation. Hematological/Lymphatic/Immunilogical: No cervical lymphadenopathy. Cardiovascular: Normal rate, regular rhythm. Grossly normal heart sounds.  Good peripheral circulation. Elevated systolic blood pressure Respiratory: Normal respiratory effort.  No retractions. Lungs CTAB. Gastrointestinal: Soft and nontender. No distention. No abdominal bruits. No CVA tenderness. Musculoskeletal: No lower extremity tenderness nor edema.  No joint  effusions. Neurologic:  Normal speech and language. No gross focal neurologic deficits are appreciated. No gait instability. Skin:  Skin is warm, dry and intact. No rash noted. Psychiatric: Mood and affect are normal. Speech and behavior are normal.  ____________________________________________   LABS (all labs ordered are listed, but only abnormal results are displayed)  Labs Reviewed  CULTURE, GROUP A STREP ([facility] ONLY)  POCT RAPID STREP A   ____________________________________________  EKG   ____________________________________________  RADIOLOGY   ____________________________________________   PROCEDURES  Procedure(s) performed: None  Critical Care performed: No  ____________________________________________   INITIAL IMPRESSION / ASSESSMENT AND PLAN / ED COURSE  Pertinent labs & imaging results that were available during my care of the patient were reviewed by me and considered in my medical decision making (see chart for details).  Scars negative strep test with patient advised culture is pending. Patient given home supportive care instructions and advised to follow-up with ENT clinic for definitive evaluation and treatment. Patient given a prescription for viscous lidocaine and Benadryl mix for swish and swallow. Patient given a Medrol Dosepak to take as directed. ____________________________________________   FINAL CLINICAL IMPRESSION(S) / ED DIAGNOSES  Final diagnoses:  Tonsillitis and adenoiditis, chronic      Joni ReiningRonald K Smith, PA-C 07/07/15 1756  Minna AntisKevin Paduchowski, MD 07/07/15 [phone]

## 2015-07-07 NOTE — ED Notes (Signed)
Pt c/o sore throat for the past 2 weeks..was states she has been taking goody powder and the afrin we gave her

## 2015-07-07 NOTE — ED Notes (Addendum)
Pt complains of sore throat x2weeks was seen here for same and now having worsening sypmtoms, denies fever,chills. Pt taking goody powder and afrin at home with no relief.

## 2015-07-10 LAB — CULTURE, GROUP A STREP (THRC)

## 2015-07-11 ENCOUNTER — Telehealth: Payer: Self-pay | Admitting: Pharmacist

## 2015-07-11 NOTE — Telephone Encounter (Signed)
Left VM message for Pt to return call  

## 2015-07-12 NOTE — Telephone Encounter (Signed)
Spoke with pt. Informed her of culture results. Offered to call rx into pharmacy. Pt requested [facility] [address] in [place]. Rx for Zpak was called into pharmacy. Authorized by Dr Huel CoteQuigley.   Olene FlossMelissa D Shameca Landen, Pharm.D Clinical Pharmacist

## 2015-07-25 IMAGING — US US OB DETAIL+14 WK - NRPT [facility]
1 series · 14 of 28 positions shown · non-contrast
Comparison: none

[Series 1: us ob detail+14 wk - nrpt [facility] · 0.30mm/px · 14 of 96 slices shown]
[im 4/96]
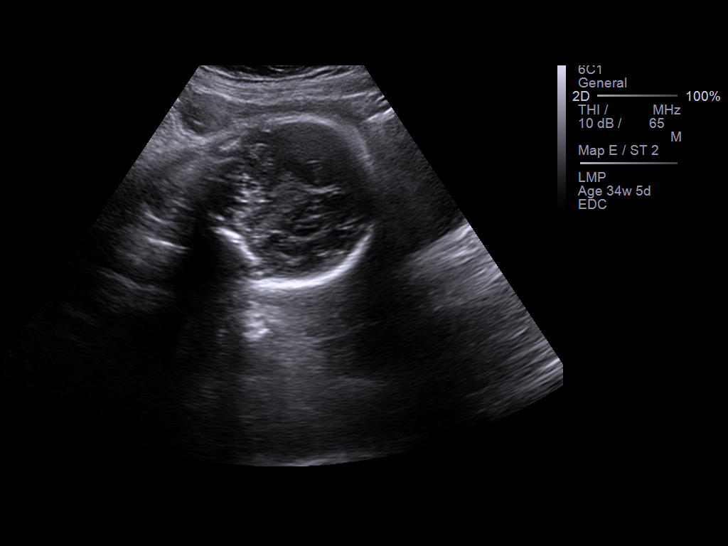
[im 11/96]
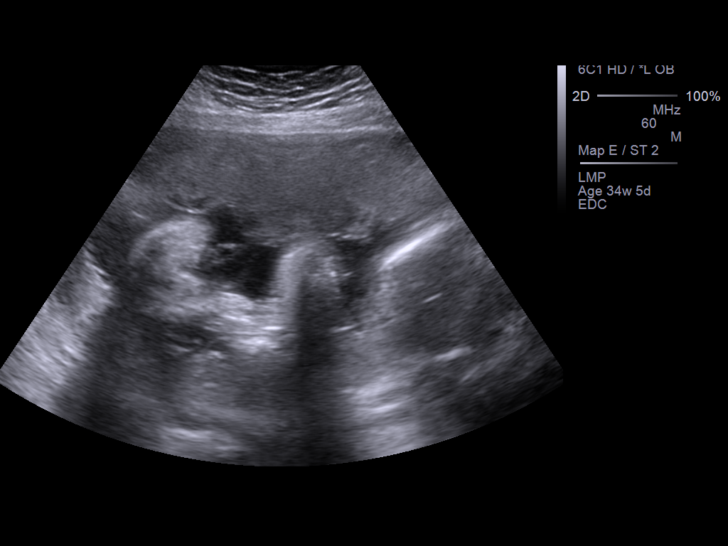
[im 18/96]
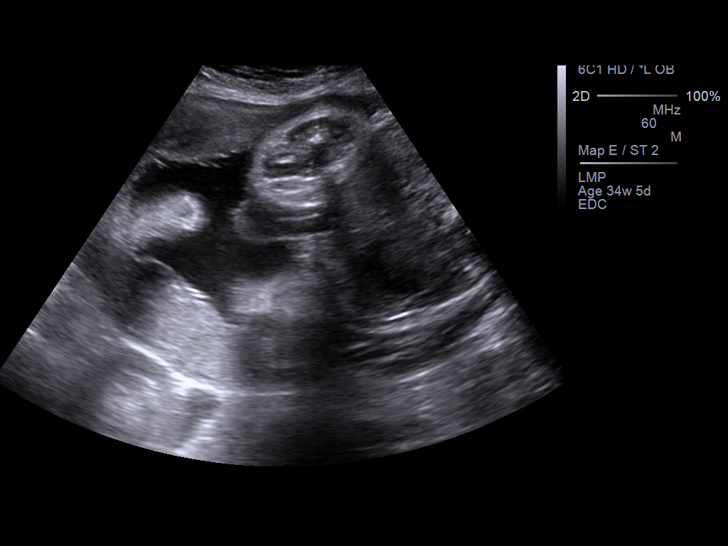
[im 25/96]
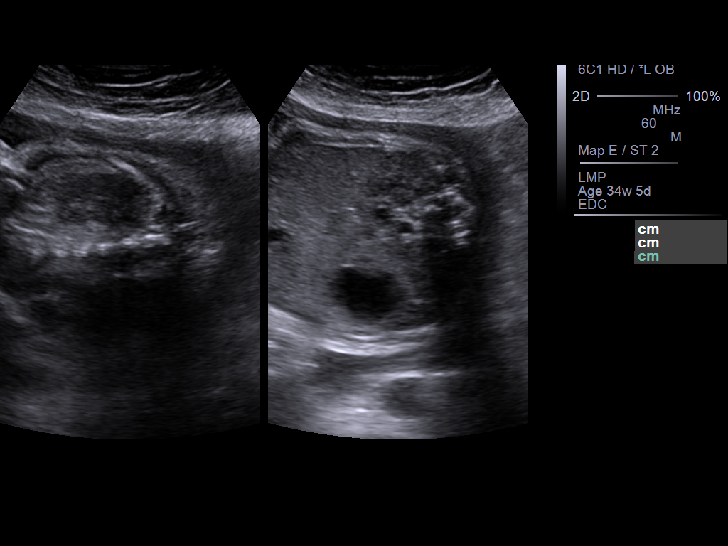
[im 32/96]
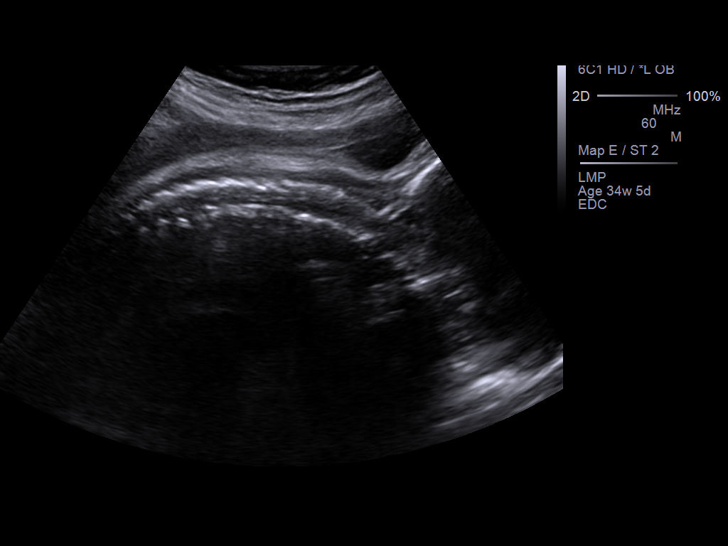
[im 39/96]
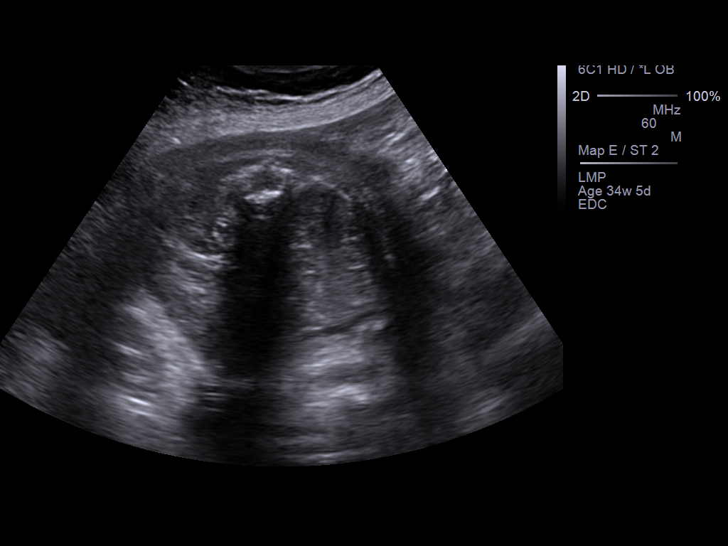
[im 46/96]
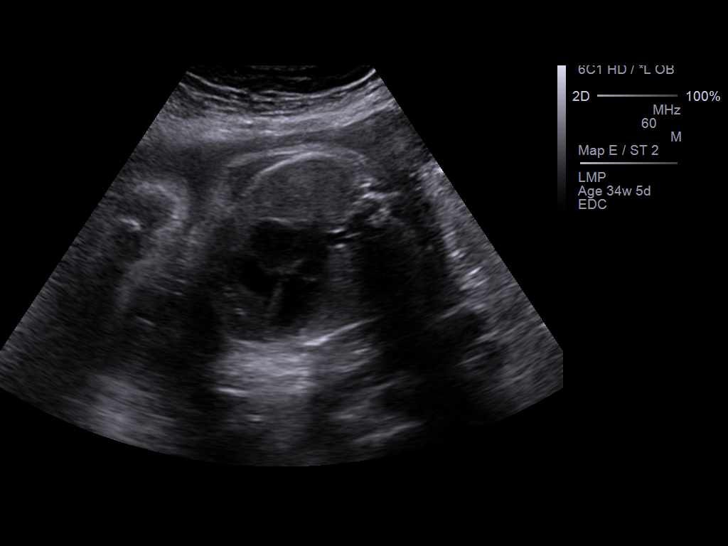
[im 53/96]
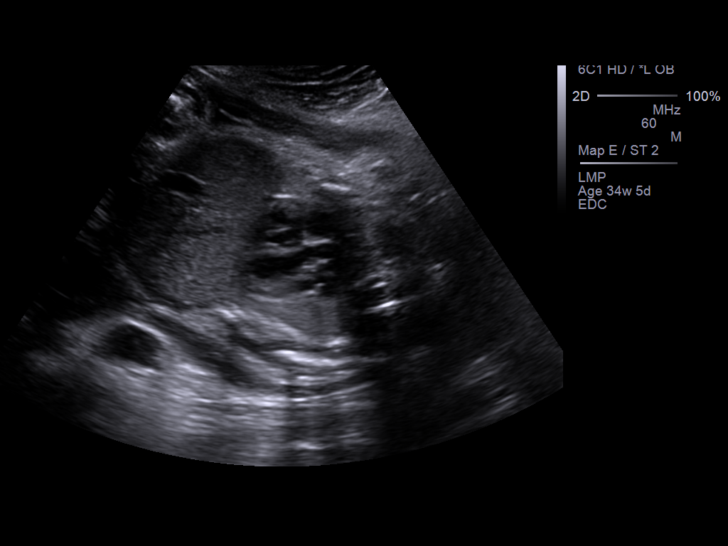
[im 60/96]
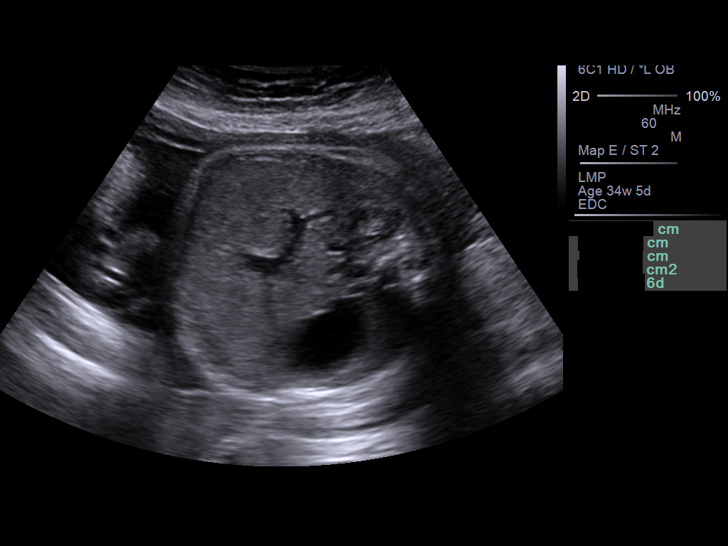
[im 67/96]
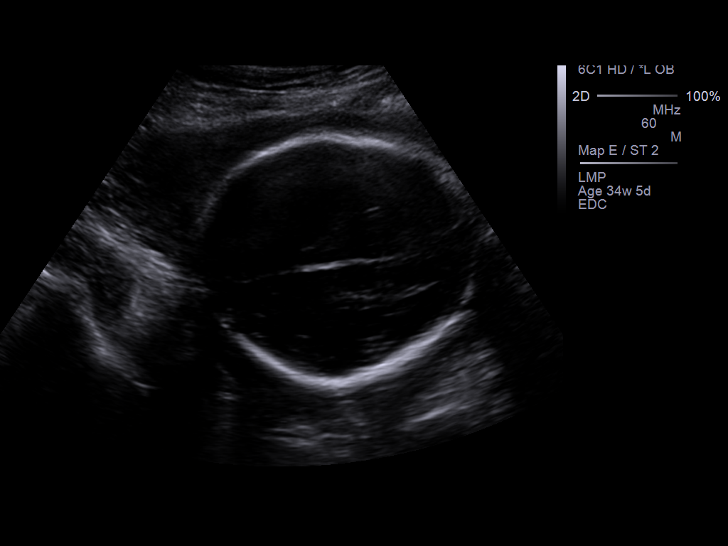
[im 74/96]
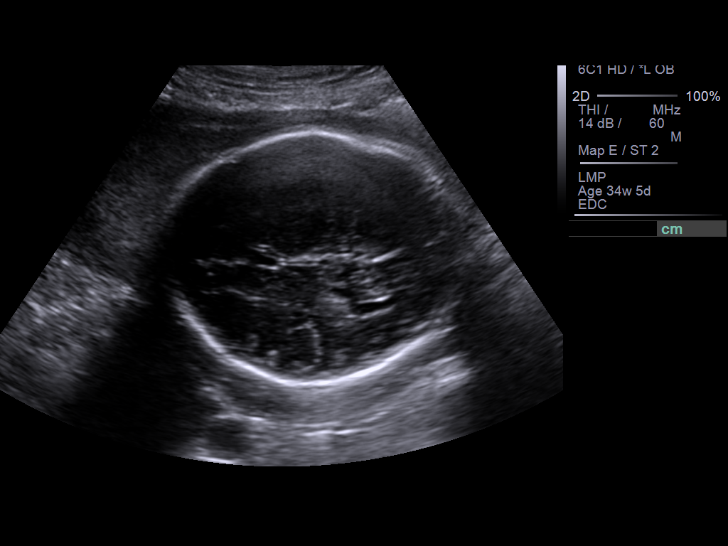
[im 81/96]
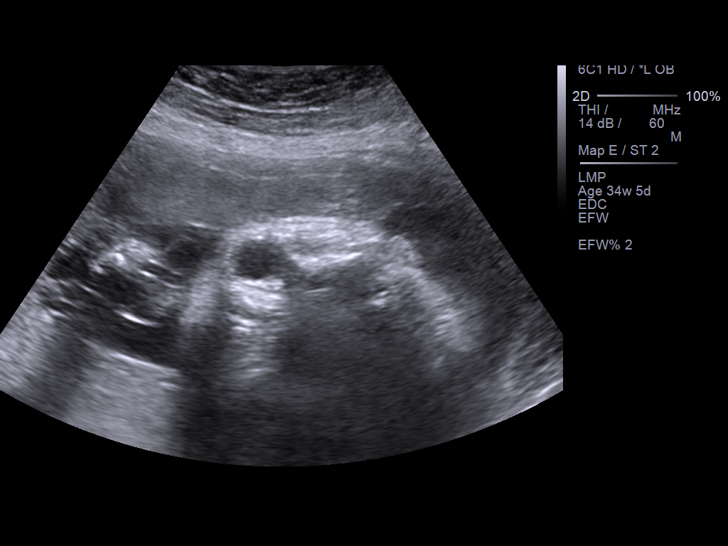
[im 88/96]
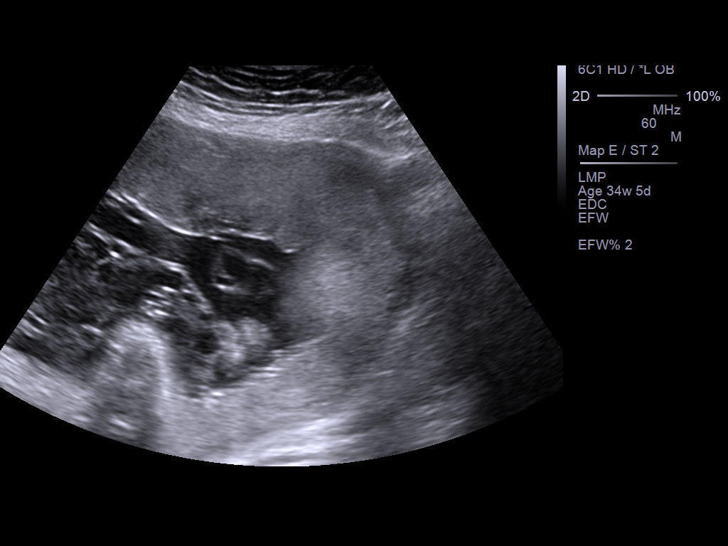
[im 96/96]
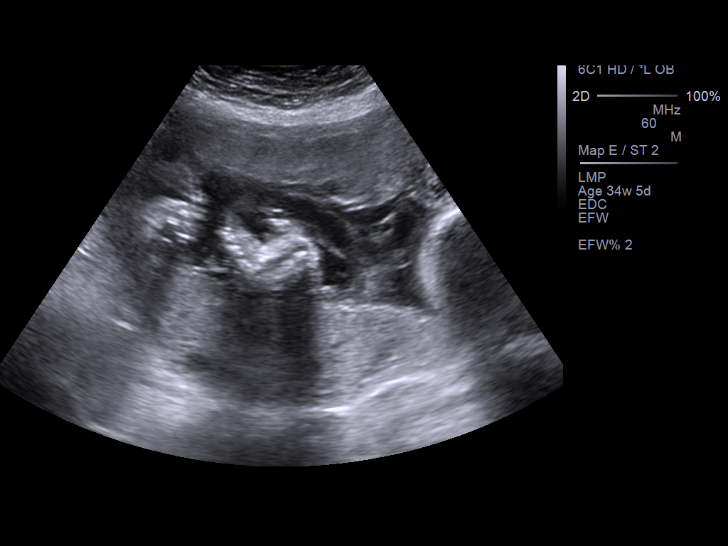

[14 of 28 positions shown; findings below may reference images not displayed]

IMAGES IMPORTED FROM THE [employer] WORKFLOW SYSTEM
NO DICTATION FOR STUDY

## 2015-08-23 ENCOUNTER — Emergency Department: Payer: Self-pay

## 2015-08-23 ENCOUNTER — Encounter: Payer: Self-pay | Admitting: Emergency Medicine

## 2015-08-23 ENCOUNTER — Emergency Department
Admission: EM | Admit: 2015-08-23 | Discharge: 2015-08-23 | Disposition: A | Discharge status: Home / Self Care | Payer: Self-pay | Attending: Student | Admitting: Student

## 2015-08-23 DIAGNOSIS — J069 Acute upper respiratory infection, unspecified: Secondary | ICD-10-CM | POA: Insufficient documentation

## 2015-08-23 DIAGNOSIS — L089 Local infection of the skin and subcutaneous tissue, unspecified: Secondary | ICD-10-CM | POA: Insufficient documentation

## 2015-08-23 DIAGNOSIS — Z79899 Other long term (current) drug therapy: Secondary | ICD-10-CM | POA: Insufficient documentation

## 2015-08-23 DIAGNOSIS — J029 Acute pharyngitis, unspecified: Secondary | ICD-10-CM

## 2015-08-23 DIAGNOSIS — J028 Acute pharyngitis due to other specified organisms: Secondary | ICD-10-CM

## 2015-08-23 DIAGNOSIS — Z792 Long term (current) use of antibiotics: Secondary | ICD-10-CM | POA: Insufficient documentation

## 2015-08-23 DIAGNOSIS — Z880 Allergy status to penicillin: Secondary | ICD-10-CM | POA: Insufficient documentation

## 2015-08-23 DIAGNOSIS — F17200 Nicotine dependence, unspecified, uncomplicated: Secondary | ICD-10-CM | POA: Insufficient documentation

## 2015-08-23 DIAGNOSIS — B9789 Other viral agents as the cause of diseases classified elsewhere: Secondary | ICD-10-CM

## 2015-08-23 DIAGNOSIS — L301 Dyshidrosis [pompholyx]: Secondary | ICD-10-CM | POA: Insufficient documentation

## 2015-08-23 DIAGNOSIS — Z3202 Encounter for pregnancy test, result negative: Secondary | ICD-10-CM | POA: Insufficient documentation

## 2015-08-23 LAB — CBC WITH DIFFERENTIAL/PLATELET
Basophils Absolute: 0.1 10*3/uL (ref 0–0.1)
Basophils Relative: 1 %
Eosinophils Absolute: 1.1 10*3/uL — ABNORMAL HIGH (ref 0–0.7)
Eosinophils Relative: 12 %
HCT: 35 % (ref 35.0–47.0)
Hemoglobin: 11.1 g/dL — ABNORMAL LOW (ref 12.0–16.0)
Lymphocytes Relative: 21 %
Lymphs Abs: 1.9 10*3/uL (ref 1.0–3.6)
MCH: 24 pg — ABNORMAL LOW (ref 26.0–34.0)
MCHC: 31.6 g/dL — ABNORMAL LOW (ref 32.0–36.0)
MCV: 75.8 fL — ABNORMAL LOW (ref 80.0–100.0)
Monocytes Absolute: 0.7 10*3/uL (ref 0.2–0.9)
Monocytes Relative: 7 %
Neutro Abs: 5.3 10*3/uL (ref 1.4–6.5)
Neutrophils Relative %: 59 %
Platelets: 252 10*3/uL (ref 150–440)
RBC: 4.62 MIL/uL (ref 3.80–5.20)
RDW: 17.4 % — ABNORMAL HIGH (ref 11.5–14.5)
WBC: 9 10*3/uL (ref 3.6–11.0)

## 2015-08-23 LAB — POCT PREGNANCY, URINE: Preg Test, Ur: NEGATIVE

## 2015-08-23 LAB — BASIC METABOLIC PANEL
Anion gap: 6 (ref 5–15)
BUN: 15 mg/dL (ref 6–20)
CO2: 24 mmol/L (ref 22–32)
Calcium: 8.4 mg/dL — ABNORMAL LOW (ref 8.9–10.3)
Chloride: 103 mmol/L (ref 101–111)
Creatinine, Ser: 0.68 mg/dL (ref 0.44–1.00)
GFR calc Af Amer: >60 mL/min (ref >60)
GFR calc non Af Amer: >60 mL/min (ref >60)
Glucose, Bld: 118 mg/dL — ABNORMAL HIGH (ref 65–99)
Potassium: 4.2 mmol/L (ref 3.5–5.1)
Sodium: 133 mmol/L — ABNORMAL LOW (ref 135–145)

## 2015-08-23 LAB — POCT RAPID STREP A: Streptococcus, Group A Screen (Direct): NEGATIVE

## 2015-08-23 MED ORDER — LIDOCAINE VISCOUS 2 % MT SOLN
15.0000 mL | Freq: Once | OROMUCOSAL | Status: AC
  Administered 2015-08-23: 15 mL via OROMUCOSAL
  Filled 2015-08-23: qty 15

## 2015-08-23 MED ORDER — DESOXIMETASONE 0.25 % EX CREA: 1.0000 "application " | TOPICAL_CREAM | Freq: Two times a day (BID) | CUTANEOUS | Status: DC

## 2015-08-23 MED ORDER — DEXAMETHASONE SODIUM PHOSPHATE 10 MG/ML IJ SOLN
10.0000 mg | Freq: Once | INTRAMUSCULAR | Status: AC
  Administered 2015-08-23: 10 mg via INTRAVENOUS
  Filled 2015-08-23: qty 1

## 2015-08-23 MED ORDER — PSEUDOEPH-BROMPHEN-DM 30-2-10 MG/5ML PO SYRP: 5.0000 mL | ORAL_SOLUTION | Freq: Four times a day (QID) | ORAL | Status: DC | PRN

## 2015-08-23 MED ORDER — IOHEXOL 300 MG/ML  SOLN
75.0000 mL | Freq: Once | INTRAMUSCULAR | Status: AC | PRN
  Administered 2015-08-23: 75 mL via INTRAVENOUS
  Filled 2015-08-23: qty 75

## 2015-08-23 MED ORDER — NEOMYCIN-POLYMYXIN-PRAMOXINE 1 % EX CREA: TOPICAL_CREAM | Freq: Two times a day (BID) | CUTANEOUS | Status: DC

## 2015-08-23 MED ORDER — LIDOCAINE VISCOUS 2 % MT SOLN: 5.0000 mL | Freq: Four times a day (QID) | OROMUCOSAL | Status: DC | PRN

## 2015-08-23 MED ORDER — DIPHENHYDRAMINE HCL 12.5 MG/5ML PO ELIX
25.0000 mg | ORAL_SOLUTION | Freq: Once | ORAL | Status: AC
Start: 2015-08-23 — End: 2015-08-23
  Administered 2015-08-23: 25 mg via ORAL
  Filled 2015-08-23: qty 10

## 2015-08-23 NOTE — ED Notes (Signed)
Throat red, swollen, cold symptoms. Back pain with coughing.

## 2015-08-23 NOTE — ED Notes (Signed)
Pt to ed with c/o back pain center of back x 3 days.

## 2015-08-23 NOTE — ED Provider Notes (Signed)
[facility] Emergency Department Provider Note  ____________________________________________  Time seen: Approximately 8:02 AM  I have reviewed the triage vital signs and the nursing notes.   HISTORY  Chief Complaint Back Pain    HPI Sandra Hines is a 44 y.o. female patient complain of sore throat, and decreased forced volume, pain with swallowing, upper back pain with deep inspirations. Patient also complaining of coughing up blood. Duration of complaints 3 days. Patient also concern for rash between her fingers. Patient denies any fever or chills associated this complaint. Patient rates to tobacco products usage. Patient rates the pain as 7/10 describe the pain as "achy". No palliative measures taken for this complaint.   History reviewed. No pertinent past medical history.  There are no active problems to display for this patient.   History reviewed. No pertinent past surgical history.  Current Outpatient Rx  Name  Route  Sig  Dispense  Refill  . brompheniramine-pseudoephedrine-DM 30-2-10 MG/5ML syrup   Oral   Take 5 mLs by mouth 4 (four) times daily as needed. Mixed with 5 mL of viscous lidocaine for swish and swallow.   120 mL   0   . desoximetasone (TOPICORT) 0.25 % cream   Topical   Apply 1 application topically 2 (two) times daily.   30 g   0   . diphenhydrAMINE (BENYLIN) 12.5 MG/5ML syrup   Oral   Take 5 mLs (12.5 mg total) by mouth 4 (four) times daily as needed for allergies. 5 mL of viscous lidocaine for swish and swallow.   118 mL   0   . doxycycline (VIBRAMYCIN) 50 MG capsule   Oral   Take 2 capsules (100 mg total) by mouth 2 (two) times daily.   28 capsule   0   . lidocaine (XYLOCAINE) 2 % solution   Mouth/Throat   Use as directed 5 mLs in the mouth or throat every 6 (six) hours as needed for mouth pain. Mixed with 5 mL of Benadryl for swish and swallow.   100 mL   0   . lidocaine (XYLOCAINE) 2 % solution    Mouth/Throat   Use as directed 5 mLs in the mouth or throat every 6 (six) hours as needed for mouth pain. 5 mL mixed with Benadryl for swish and swallow.   100 mL   0   . methylPREDNISolone (MEDROL DOSEPAK) 4 MG TBPK tablet      Take Tapered dose as directed   21 tablet   0   . metroNIDAZOLE (FLAGYL) 500 MG tablet   Oral   Take 1 tablet (500 mg total) by mouth 2 (two) times daily.   14 tablet   0   . neomycin-polymyxin-pramoxine (NEOSPORIN PLUS) 1 % cream   Topical   Apply topically 2 (two) times daily.   14.2 g   0     Allergies Penicillins  History reviewed. No pertinent family history.  Social History Social History  Substance Use Topics  . Smoking status: Current Every Day Smoker  . Smokeless tobacco: Never Used  . Alcohol Use: No    Review of Systems Constitutional: No fever/chills Eyes: No visual changes. ENT: Sore throat Cardiovascular: S1 pain Respiratory: Denies shortness of breath. Coughing up blood Gastrointestinal: No abdominal pain.  No nausea, no vomiting.  No diarrhea.  No constipation. Genitourinary: Negative for dysuria. Musculoskeletal: Negative for back pain. Skin: Negative for rash. Neurological: Negative for headaches, focal weakness or numbness. Allergic/Immunilogical: Patient to penicillin 10-point  ROS otherwise negative.  ____________________________________________   PHYSICAL EXAM:  VITAL SIGNS: ED Triage Vitals  Enc Vitals Group     BP 08/23/15 0753 146/98 mmHg     Pulse Rate 08/23/15 0753 95     Resp 08/23/15 0753 20     Temp 08/23/15 0753 98 F (36.7 C)     Temp Source 08/23/15 0753 Oral     SpO2 08/23/15 0753 99 %     Weight 08/23/15 0753 227 lb (102.967 kg)     Height 08/23/15 0753  (1.702 m)     Head Cir --      Peak Flow --      Pain Score 08/23/15 0749 7     Pain Loc --      Pain Edu? --      Excl. in GC? --     Constitutional: Alert and oriented. Well appearing and in no acute distress. Eyes:  Conjunctivae are normal. PERRL. EOMI. Head: Atraumatic. Nose: No congestion/rhinnorhea. Mouth/Throat: Mucous membranes are moist.  Oropharynx erythematous. Erythematous edematous tonsils without exudate. Neck: No stridor.  No cervical spine tenderness to palpation. Hematological/Lymphatic/Immunilogical: Bilateral cervical lymphadenopathy. Cardiovascular: Normal rate, regular rhythm. Grossly normal heart sounds.  Good peripheral circulation. Respiratory: Normal respiratory effort.  No retractions. Lungs CTAB. Gastrointestinal: Soft and nontender. No distention. No abdominal bruits. No CVA tenderness. Musculoskeletal: No lower extremity tenderness nor edema.  No joint effusions. Neurologic:  Normal speech and language. No gross focal neurologic deficits are appreciated. No gait instability. Skin:  Skin is warm, dry and intact. No rash noted. Psychiatric: Mood and affect are normal. Speech and behavior are normal.  ____________________________________________   LABS (all labs ordered are listed, but only abnormal results are displayed)  Labs Reviewed  CBC WITH DIFFERENTIAL/PLATELET - Abnormal; Notable for the following:    Hemoglobin 11.1 (*)    MCV 75.8 (*)    MCH 24.0 (*)    MCHC 31.6 (*)    RDW 17.4 (*)    Eosinophils Absolute 1.1 (*)    All other components within normal limits  BASIC METABOLIC PANEL - Abnormal; Notable for the following:    Sodium 133 (*)    Glucose, Bld 118 (*)    Calcium 8.4 (*)    All other components within normal limits  CULTURE, GROUP A STREP [facility])  POCT RAPID STREP A  POCT PREGNANCY, URINE   ____________________________________________  EKG   ____________________________________________  RADIOLOGY  X-ray reveal scattered bilateral indeterminate pulmonary nodules. Patient history smoking and coughing up blood a CT scan done for further evaluation. CT scan revealed extensive old granulomatous disease changes in  bilateral  lung. ____________________________________________   PROCEDURES  Procedure(s) performed: None  Critical Care performed: No  ____________________________________________   INITIAL IMPRESSION / ASSESSMENT AND PLAN / ED COURSE  Pertinent labs & imaging results that were available during my care of the patient were reviewed by me and considered in my medical decision making (see chart for details).  Pharyngitis, URI, and eczema. Patient given his prescription for viscous lidocaine, Bromfed-DM, Topicort, Neosporin, and Medrol dose pack. Patient counseled on tobacco cessation. She advised to follow-up with [facility].___________________________________________   FINAL CLINICAL IMPRESSION(S) / ED DIAGNOSES  Final diagnoses:  Viral upper respiratory illness  Acute viral pharyngitis  Eczema, dyshidrotic  Skin infection      Joni Reining, PA-C 08/23/15 1115  Joni Reining, PA-C 08/23/15 1120  Gayla Doss, MD 08/23/15 [phone]

## 2015-08-25 LAB — CULTURE, GROUP A STREP (THRC)

## 2015-09-09 ENCOUNTER — Emergency Department
Admission: EM | Admit: 2015-09-09 | Discharge: 2015-09-09 | Disposition: A | Discharge status: Home / Self Care | Payer: Self-pay | Attending: Emergency Medicine | Admitting: Emergency Medicine

## 2015-09-09 ENCOUNTER — Encounter: Payer: Self-pay | Admitting: Emergency Medicine

## 2015-09-09 DIAGNOSIS — Z880 Allergy status to penicillin: Secondary | ICD-10-CM | POA: Insufficient documentation

## 2015-09-09 DIAGNOSIS — Z79899 Other long term (current) drug therapy: Secondary | ICD-10-CM | POA: Insufficient documentation

## 2015-09-09 DIAGNOSIS — Z792 Long term (current) use of antibiotics: Secondary | ICD-10-CM | POA: Insufficient documentation

## 2015-09-09 DIAGNOSIS — F17200 Nicotine dependence, unspecified, uncomplicated: Secondary | ICD-10-CM | POA: Insufficient documentation

## 2015-09-09 DIAGNOSIS — J029 Acute pharyngitis, unspecified: Secondary | ICD-10-CM | POA: Insufficient documentation

## 2015-09-09 LAB — POCT RAPID STREP A: Streptococcus, Group A Screen (Direct): NEGATIVE

## 2015-09-09 LAB — MONONUCLEOSIS SCREEN: Mono Screen: NEGATIVE

## 2015-09-09 MED ORDER — CLINDAMYCIN HCL 300 MG PO CAPS: 300.0000 mg | ORAL_CAPSULE | Freq: Three times a day (TID) | ORAL | Status: DC

## 2015-09-09 MED ORDER — HYDROCODONE-ACETAMINOPHEN 5-325 MG PO TABS: 1.0000 | ORAL_TABLET | Freq: Four times a day (QID) | ORAL | Status: DC | PRN

## 2015-09-09 MED ORDER — HYDROCODONE-ACETAMINOPHEN 5-325 MG PO TABS
1.0000 | ORAL_TABLET | Freq: Once | ORAL | Status: DC
  Filled 2015-09-09: qty 1

## 2015-09-09 MED ORDER — PREDNISONE 20 MG PO TABS
40.0000 mg | ORAL_TABLET | Freq: Every day | ORAL | Status: AC
Start: 2015-09-10 — End: 2015-09-14

## 2015-09-09 MED ORDER — PREDNISONE 20 MG PO TABS
60.0000 mg | ORAL_TABLET | Freq: Once | ORAL | Status: AC
  Administered 2015-09-09: 60 mg via ORAL
  Filled 2015-09-09: qty 3

## 2015-09-09 NOTE — ED Notes (Signed)
Pt reports sore throat x several months and right ear pain starting last night.  Pt NAD at this time, respirations equal and unlabored, skin warm and dry.

## 2015-09-09 NOTE — ED Notes (Addendum)
Pt in with co sore throat x 2 months, has been here several times for the same.  Was told she had strep, pt states no improvement.  States now has right earache.

## 2015-09-09 NOTE — ED Provider Notes (Signed)
Time Seen: Approximately 0 700 I have reviewed the triage notes  Chief Complaint: Sore Throat   History of Present Illness: Sandra Hines is a 45 y.o. female who presents with a recurrent pharyngitis. Patient's had a negative strep test but then went on to do found to have strep A on culture. Patient states that she took a course of Zithromax but is not really noticed any improvement. Still has sore throat. She denies any gonococcal exposure or large lymph nodes. She states she's not been tested for mononucleosis at this point. Denies any chest pain or shortness of breath.   History reviewed. No pertinent past medical history.  There are no active problems to display for this patient.   History reviewed. No pertinent past surgical history.  History reviewed. No pertinent past surgical history.  Current Outpatient Rx  Name  Route  Sig  Dispense  Refill  . brompheniramine-pseudoephedrine-DM 30-2-10 MG/5ML syrup   Oral   Take 5 mLs by mouth 4 (four) times daily as needed. Mixed with 5 mL of viscous lidocaine for swish and swallow.   120 mL   0   . clindamycin (CLEOCIN) 300 MG capsule   Oral   Take 1 capsule (300 mg total) by mouth 3 (three) times daily.   30 capsule   0   . desoximetasone (TOPICORT) 0.25 % cream   Topical   Apply 1 application topically 2 (two) times daily.   30 g   0   . diphenhydrAMINE (BENYLIN) 12.5 MG/5ML syrup   Oral   Take 5 mLs (12.5 mg total) by mouth 4 (four) times daily as needed for allergies. 5 mL of viscous lidocaine for swish and swallow.   118 mL   0   . doxycycline (VIBRAMYCIN) 50 MG capsule   Oral   Take 2 capsules (100 mg total) by mouth 2 (two) times daily.   28 capsule   0   . HYDROcodone-acetaminophen (NORCO) 5-325 MG tablet   Oral   Take 1 tablet by mouth every 6 (six) hours as needed for moderate pain.   20 tablet   0   . lidocaine (XYLOCAINE) 2 % solution   Mouth/Throat   Use as directed 5 mLs in the mouth or throat  every 6 (six) hours as needed for mouth pain. Mixed with 5 mL of Benadryl for swish and swallow.   100 mL   0   . lidocaine (XYLOCAINE) 2 % solution   Mouth/Throat   Use as directed 5 mLs in the mouth or throat every 6 (six) hours as needed for mouth pain. 5 mL mixed with Benadryl for swish and swallow.   100 mL   0   . metroNIDAZOLE (FLAGYL) 500 MG tablet   Oral   Take 1 tablet (500 mg total) by mouth 2 (two) times daily.   14 tablet   0   . neomycin-polymyxin-pramoxine (NEOSPORIN PLUS) 1 % cream   Topical   Apply topically 2 (two) times daily.   14.2 g   0   . predniSONE (DELTASONE) 20 MG tablet   Oral   Take 2 tablets (40 mg total) by mouth daily.   10 tablet   0     Allergies:  Penicillins  Family History: History reviewed. No pertinent family history.  Social History: Social History  Substance Use Topics  . Smoking status: Current Every Day Smoker  . Smokeless tobacco: Never Used  . Alcohol Use: No  Review of Systems:   Constitutional: No fever Eyes: No visual disturbances ENT: No sore throat, ear pain Cardiac: No chest pain Respiratory: No shortness of breath, wheezing, or stridor Abdomen: No abdominal pain, no vomiting, No diarrhea Extremities: No peripheral edema, cyanosis Skin: No rashes, easy bruising Neurologic: No focal weakness, trouble with speech or swollowing Urologic: No dysuria, Hematuria, or urinary frequency   Physical Exam:  ED Triage Vitals  Enc Vitals Group     BP 09/09/15 0416 150/104 mmHg     Pulse Rate 09/09/15 0416 87     Resp 09/09/15 0416 18     Temp 09/09/15 0416 98.2 F (36.8 C)     Temp Source 09/09/15 0416 Oral     SpO2 09/09/15 0416 99 %     Weight 09/09/15 0416 229 lb (103.874 kg)     Height 09/09/15 0416  (1.702 m)     Head Cir --      Peak Flow --      Pain Score 09/09/15 0416 8     Pain Loc --      Pain Edu? --      Excl. in GC? --     General: Awake , Alert , and Oriented times 3; GCS  15 Head: Normal cephalic , atraumatic Eyes: Pupils equal , round, reactive to light Nose/Throat: Examination of the oral cavity shows extensive patchy exudate noted especially on the right tonsillar region. There is no shift of the uvula no visible abscess. Neck: Supple, Full range of motion, No anterior adenopathy or palpable thyroid masses Lungs: Clear to ascultation without wheezes , rhonchi, or rales Heart: Regular rate, regular rhythm without murmurs , gallops , or rubs Abdomen: Soft, non tender without rebound, guarding , or rigidity; bowel sounds positive and symmetric in all 4 quadrants. No organomegaly .        Extremities: 2 plus symmetric pulses. No edema, clubbing or cyanosis Neurologic: normal ambulation, Motor symmetric without deficits, sensory intact Skin: warm, dry, no rashes   Labs:   All laboratory work was reviewed including any pertinent negatives or positives listed below:  Labs Reviewed  CULTURE, GROUP A STREP [facility])  MONONUCLEOSIS SCREEN  POCT RAPID STREP A   rapid strep and mononucleosis screen were negative   ED Course: * Since the patient did not have full treatment of her pharyngitis with the Zithromax for the strep A I felt that we would stop with her on clindamycin. Also referred her to ENT unassigned. Days group A strep is still pending as far as the culture is concerned. She does not appear to have mononucleosis at this time. She was prescribed prednisone and Norco for pain as needed but was encouraged again that she'll need ENT follow-up.   Assessment: Acute pharyngitis  Final Clinical Impression: *  Final diagnoses:  Pharyngitis     Plan: * Outpatient Patient was advised to return immediately if condition worsens. Patient was advised to follow up with their primary care physician or other specialized physicians involved in their outpatient care             Jennye Moccasin, MD 09/09/15 [phone]

## 2015-09-09 NOTE — ED Notes (Addendum)
Declined lab work. Has to leave now to pick up her children. MD aware.   Agreed to have lab drawn and for [place] to call her with results per MD request.

## 2015-09-09 NOTE — Discharge Instructions (Signed)
Pharyngitis Pharyngitis is redness, pain, and swelling (inflammation) of your pharynx.  CAUSES  Pharyngitis is usually caused by infection. Most of the time, these infections are from viruses (viral) and are part of a cold. However, sometimes pharyngitis is caused by bacteria (bacterial). Pharyngitis can also be caused by allergies. Viral pharyngitis may be spread from person to person by coughing, sneezing, and personal items or utensils (cups, forks, spoons, toothbrushes). Bacterial pharyngitis may be spread from person to person by more intimate contact, such as kissing.  SIGNS AND SYMPTOMS  Symptoms of pharyngitis include:   Sore throat.   Tiredness (fatigue).   Low-grade fever.   Headache.  Joint pain and muscle aches.  Skin rashes.  Swollen lymph nodes.  Plaque-like film on throat or tonsils (often seen with bacterial pharyngitis). DIAGNOSIS  Your health care provider will ask you questions about your illness and your symptoms. Your medical history, along with a physical exam, is often all that is needed to diagnose pharyngitis. Sometimes, a rapid strep test is done. Other lab tests may also be done, depending on the suspected cause.  TREATMENT  Viral pharyngitis will usually get better in 3-4 days without the use of medicine. Bacterial pharyngitis is treated with medicines that kill germs (antibiotics).  HOME CARE INSTRUCTIONS   Drink enough water and fluids to keep your urine clear or pale yellow.   Only take over-the-counter or prescription medicines as directed by your health care provider:   If you are prescribed antibiotics, make sure you finish them even if you start to feel better.   Do not take aspirin.   Get lots of rest.   Gargle with 8 oz of salt water ( tsp of salt per 1 qt of water) as often as every 1-2 hours to soothe your throat.   Throat lozenges (if you are not at risk for choking) or sprays may be used to soothe your throat. SEEK MEDICAL  CARE IF:   You have large, tender lumps in your neck.  You have a rash.  You cough up green, yellow-brown, or bloody spit. SEEK IMMEDIATE MEDICAL CARE IF:   Your neck becomes stiff.  You drool or are unable to swallow liquids.  You vomit or are unable to keep medicines or liquids down.  You have severe pain that does not go away with the use of recommended medicines.  You have trouble breathing (not caused by a stuffy nose). MAKE SURE YOU:   Understand these instructions.  Will watch your condition.  Will get help right away if you are not doing well or get worse.   This information is not intended to replace advice given to you by your health care provider. Make sure you discuss any questions you have with your health care provider.   Document Released: 07/09/2005 Document Revised: 04/29/2013 Document Reviewed: 03/16/2013 Elsevier Interactive Patient Education [employer]  Please return immediately if condition worsens. Please contact her primary physician or the physician you were given for referral. If you have any specialist physicians involved in her treatment and plan please also contact them. Thank you for using [place] [facility].

## 2015-09-09 NOTE — ED Notes (Signed)
Pt discharged home after verbalizing understanding of discharge instructions; nad noted. 

## 2015-09-11 LAB — CULTURE, GROUP A STREP (THRC)

## 2016-11-05 ENCOUNTER — Emergency Department
Admission: EM | Admit: 2016-11-05 | Discharge: 2016-11-05 | Disposition: A | Discharge status: Home / Self Care | Payer: Self-pay | Attending: Emergency Medicine | Admitting: Emergency Medicine

## 2016-11-05 ENCOUNTER — Encounter: Payer: Self-pay | Admitting: *Deleted

## 2016-11-05 DIAGNOSIS — N898 Other specified noninflammatory disorders of vagina: Secondary | ICD-10-CM | POA: Insufficient documentation

## 2016-11-05 DIAGNOSIS — Z202 Contact with and (suspected) exposure to infections with a predominantly sexual mode of transmission: Secondary | ICD-10-CM | POA: Insufficient documentation

## 2016-11-05 DIAGNOSIS — F17210 Nicotine dependence, cigarettes, uncomplicated: Secondary | ICD-10-CM | POA: Insufficient documentation

## 2016-11-05 DIAGNOSIS — Z79899 Other long term (current) drug therapy: Secondary | ICD-10-CM | POA: Insufficient documentation

## 2016-11-05 LAB — URINALYSIS, COMPLETE (UACMP) WITH MICROSCOPIC
Bacteria, UA: NONE SEEN
Bilirubin Urine: NEGATIVE
Glucose, UA: NEGATIVE mg/dL
Hgb urine dipstick: NEGATIVE
Ketones, ur: NEGATIVE mg/dL
Leukocytes, UA: NEGATIVE
Nitrite: NEGATIVE
Protein, ur: NEGATIVE mg/dL
Specific Gravity, Urine: 1.02 (ref 1.005–1.030)
pH: 7 (ref 5.0–8.0)

## 2016-11-05 LAB — POCT PREGNANCY, URINE: Preg Test, Ur: NEGATIVE

## 2016-11-05 MED ORDER — AZITHROMYCIN 600 MG PO TABS
1200.0000 mg | ORAL_TABLET | Freq: Once | ORAL | Status: AC
  Administered 2016-11-05: 1200 mg via ORAL

## 2016-11-05 MED ORDER — CEFTRIAXONE SODIUM 250 MG IJ SOLR
250.0000 mg | Freq: Once | INTRAMUSCULAR | Status: AC
  Administered 2016-11-05: 250 mg via INTRAMUSCULAR
  Filled 2016-11-05: qty 250

## 2016-11-05 MED ORDER — LIDOCAINE HCL (PF) 1 % IJ SOLN
0.9000 mL | Freq: Once | INTRAMUSCULAR | Status: AC
  Administered 2016-11-05: 0.9 mL

## 2016-11-05 MED ORDER — LIDOCAINE HCL (PF) 1 % IJ SOLN
INTRAMUSCULAR | Status: AC
  Administered 2016-11-05: 0.9 mL
  Filled 2016-11-05: qty 5

## 2016-11-05 MED ORDER — AZITHROMYCIN 500 MG PO TABS
ORAL_TABLET | ORAL | Status: AC
  Administered 2016-11-05: 1200 mg via ORAL
  Filled 2016-11-05: qty 3

## 2016-11-05 MED ORDER — METRONIDAZOLE 500 MG PO TABS
2000.0000 mg | ORAL_TABLET | Freq: Once | ORAL | Status: AC
  Administered 2016-11-05: 2000 mg via ORAL
  Filled 2016-11-05: qty 4

## 2016-11-05 NOTE — ED Triage Notes (Signed)
Pt complains of a clear vaginal discharge with an odor with urine frequency

## 2016-11-05 NOTE — ED Provider Notes (Signed)
[facility] Emergency Department Provider Note  ____________________________________________  Time seen: Approximately 11:25 PM  I have reviewed the triage vital signs and the nursing notes.   HISTORY  Chief Complaint Exposure to STD    HPI Sandra Hines is a 46 y.o. female presenting to the emergency department with excessive malodorous vaginal discharge for the past 3 days. Patient has experienced increased urinary frequency. She denies dysuria or hematuria. Patient denies bilateral flank pain. Patient states that she was diagnosed with an STD "when I was just starting out". Patient denies dyspareunia. Patient is concerned that her partner of 2.5 years has been unfaithful to her. She has been afebrile. No alleviating measures have been undertaken.    History reviewed. No pertinent past medical history.  There are no active problems to display for this patient.   History reviewed. No pertinent surgical history.  Prior to Admission medications   Medication Sig Start Date End Date Taking? Authorizing Provider  brompheniramine-pseudoephedrine-DM 30-2-10 MG/5ML syrup Take 5 mLs by mouth 4 (four) times daily as needed. Mixed with 5 mL of viscous lidocaine for swish and swallow. 08/23/15   Joni Reining, PA-C  clindamycin (CLEOCIN) 300 MG capsule Take 1 capsule (300 mg total) by mouth 3 (three) times daily. 09/09/15   Jennye Moccasin, MD  desoximetasone (TOPICORT) 0.25 % cream Apply 1 application topically 2 (two) times daily. 08/23/15   Joni Reining, PA-C  diphenhydrAMINE (BENYLIN) 12.5 MG/5ML syrup Take 5 mLs (12.5 mg total) by mouth 4 (four) times daily as needed for allergies. 5 mL of viscous lidocaine for swish and swallow. 07/07/15   Joni Reining, PA-C  doxycycline (VIBRAMYCIN) 50 MG capsule Take 2 capsules (100 mg total) by mouth 2 (two) times daily. 04/11/15   Irean Hong, MD  HYDROcodone-acetaminophen (NORCO) 5-325 MG tablet Take 1 tablet by mouth every 6  (six) hours as needed for moderate pain. 09/09/15   Jennye Moccasin, MD  lidocaine (XYLOCAINE) 2 % solution Use as directed 5 mLs in the mouth or throat every 6 (six) hours as needed for mouth pain. Mixed with 5 mL of Benadryl for swish and swallow. 07/07/15   Joni Reining, PA-C  lidocaine (XYLOCAINE) 2 % solution Use as directed 5 mLs in the mouth or throat every 6 (six) hours as needed for mouth pain. 5 mL mixed with Benadryl for swish and swallow. 08/23/15   Joni Reining, PA-C  metroNIDAZOLE (FLAGYL) 500 MG tablet Take 1 tablet (500 mg total) by mouth 2 (two) times daily. 04/11/15   Irean Hong, MD  neomycin-polymyxin-pramoxine (NEOSPORIN PLUS) 1 % cream Apply topically 2 (two) times daily. 08/23/15   Joni Reining, PA-C    Allergies Penicillins  No family history on file.  Social History Social History  Substance Use Topics  . Smoking status: Current Every Day Smoker    Packs/day: 1.00    Types: Cigarettes  . Smokeless tobacco: Never Used  . Alcohol use No     Review of Systems  Constitutional: No fever/chills Eyes: No visual changes. No discharge ENT: No upper respiratory complaints. Cardiovascular: no chest pain. Respiratory: no cough. No SOB. Gastrointestinal: No abdominal pain.  No nausea, no vomiting.  No diarrhea.  No constipation. Genitourinary: Patient has increased vaginal discharge.  Musculoskeletal: Negative for musculoskeletal pain. Skin: Negative for rash, abrasions, lacerations, ecchymosis. Neurological: Negative for headaches, focal weakness or numbness.  ____________________________________________   PHYSICAL EXAM:  VITAL SIGNS: ED Triage Vitals  Enc Vitals Group     BP 11/05/16 2135 (!) 168/88     Pulse Rate 11/05/16 2135 89     Resp 11/05/16 2135 20     Temp 11/05/16 2135 98.7 F (37.1 C)     Temp Source 11/05/16 2135 Oral     SpO2 11/05/16 2135 98 %     Weight 11/05/16 2136 220 lb (99.8 kg)     Height 11/05/16 2136  (1.702 m)     Head  Circumference --      Peak Flow --      Pain Score --      Pain Loc --      Pain Edu? --      Excl. in GC? --      Constitutional: Alert and oriented. Well appearing and in no acute distress. Eyes: Conjunctivae are normal. PERRL. EOMI. Head: Atraumatic. Hematological/Lymphatic/Immunilogical: No cervical lymphadenopathy. Cardiovascular: Normal rate, regular rhythm. Normal S1 and S2.  Good peripheral circulation. Respiratory: Normal respiratory effort without tachypnea or retractions. Lungs CTAB. Good air entry to the bases with no decreased or absent breath sounds. Gastrointestinal: Bowel sounds 4 quadrants. Soft and nontender to palpation. No guarding or rigidity. No palpable masses. No distention. No CVA tenderness Musculoskeletal: Full range of motion to all extremities. No gross deformities appreciated. Neurologic:  Normal speech and language. No gross focal neurologic deficits are appreciated.  Skin:  Skin is warm, dry and intact. No rash noted. Psychiatric: Mood and affect are normal. Speech and behavior are normal. Patient exhibits appropriate insight and judgement.   ____________________________________________   LABS (all labs ordered are listed, but only abnormal results are displayed)  Labs Reviewed  URINALYSIS, COMPLETE (UACMP) WITH MICROSCOPIC - Abnormal; Notable for the following:       Result Value   Color, Urine YELLOW (*)    APPearance HAZY (*)    Squamous Epithelial / LPF 0-5 (*)    All other components within normal limits  POC URINE PREG, ED  POCT PREGNANCY, URINE   ____________________________________________  EKG   ____________________________________________  RADIOLOGY   No results found.  ____________________________________________    PROCEDURES  Procedure(s) performed:    Procedures    Medications  cefTRIAXone (ROCEPHIN) injection 250 mg (250 mg Intramuscular Given 11/05/16 2317)  azithromycin (ZITHROMAX) tablet 1,200 mg (1,200  mg Oral Given 11/05/16 2316)  metroNIDAZOLE (FLAGYL) tablet 2,000 mg (2,000 mg Oral Given 11/05/16 2316)  lidocaine (PF) (XYLOCAINE) 1 % injection 0.9 mL (0.9 mLs Other Given 11/05/16 2318)     ____________________________________________   INITIAL IMPRESSION / ASSESSMENT AND PLAN / ED COURSE  Pertinent labs & imaging results that were available during my care of the patient were reviewed by me and considered in my medical decision making (see chart for details).  Review of the [place] CSRS was performed in accordance of the [facility] prior to dispensing any controlled drugs.    Assessment and Plan: STD Exposure: Patient presents to the emergency department with increased malodorous vaginal discharge for the past 3 days along with increased urinary frequency. Patient's urinalysis was noncontributory for acute cystitis. Patient declined a pelvic exam in the emergency department against medical advice. Patient stated that she wished to be treated for gonorrhea, chlamydia and trichomoniasis empirically. Patient was advised to follow up at the local health department for further STD testing. Patient voiced understanding regarding these recommendations. Vital signs are reassuring. All patient questions were answered. ____________________________________________  FINAL CLINICAL IMPRESSION(S) / ED DIAGNOSES  Final diagnoses:  STD exposure      NEW MEDICATIONS STARTED DURING THIS VISIT:  New Prescriptions   No medications on file        This chart was dictated using voice recognition software/Dragon. Despite best efforts to proofread, errors can occur which can change the meaning. Any change was purely unintentional.    Orvil Feil, PA-C 11/05/16 2333    Arnaldo Natal, MD 11/07/16 [phone]

## 2017-07-10 ENCOUNTER — Ambulatory Visit: Payer: Self-pay | Admitting: Licensed Clinical Social Worker

## 2017-07-11 ENCOUNTER — Emergency Department: Payer: Self-pay

## 2017-07-11 ENCOUNTER — Emergency Department
Admission: EM | Admit: 2017-07-11 | Discharge: 2017-07-11 | Disposition: A | Discharge status: Home / Self Care | Payer: Self-pay | Attending: Emergency Medicine | Admitting: Emergency Medicine

## 2017-07-11 ENCOUNTER — Encounter: Payer: Self-pay | Admitting: Emergency Medicine

## 2017-07-11 ENCOUNTER — Other Ambulatory Visit: Payer: Self-pay

## 2017-07-11 DIAGNOSIS — F17210 Nicotine dependence, cigarettes, uncomplicated: Secondary | ICD-10-CM | POA: Insufficient documentation

## 2017-07-11 DIAGNOSIS — Z79899 Other long term (current) drug therapy: Secondary | ICD-10-CM | POA: Insufficient documentation

## 2017-07-11 DIAGNOSIS — N938 Other specified abnormal uterine and vaginal bleeding: Secondary | ICD-10-CM | POA: Insufficient documentation

## 2017-07-11 DIAGNOSIS — N920 Excessive and frequent menstruation with regular cycle: Secondary | ICD-10-CM | POA: Insufficient documentation

## 2017-07-11 LAB — BASIC METABOLIC PANEL
Anion gap: 6 (ref 5–15)
BUN: 9 mg/dL (ref 6–20)
CO2: 25 mmol/L (ref 22–32)
Calcium: 8.5 mg/dL — ABNORMAL LOW (ref 8.9–10.3)
Chloride: 106 mmol/L (ref 101–111)
Creatinine, Ser: 0.66 mg/dL (ref 0.44–1.00)
GFR calc Af Amer: >60 mL/min (ref >60)
GFR calc non Af Amer: >60 mL/min (ref >60)
Glucose, Bld: 80 mg/dL (ref 65–99)
Potassium: 3.6 mmol/L (ref 3.5–5.1)
Sodium: 137 mmol/L (ref 135–145)

## 2017-07-11 LAB — POCT PREGNANCY, URINE: Preg Test, Ur: NEGATIVE

## 2017-07-11 LAB — WET PREP, GENITAL
Sperm: NONE SEEN
Trich, Wet Prep: NONE SEEN
Yeast Wet Prep HPF POC: NONE SEEN

## 2017-07-11 LAB — CBC WITH DIFFERENTIAL/PLATELET
Basophils Absolute: 0.1 10*3/uL (ref 0–0.1)
Basophils Relative: 1 %
Eosinophils Absolute: 0.5 10*3/uL (ref 0–0.7)
Eosinophils Relative: 5 %
HCT: 36 % (ref 35.0–47.0)
Hemoglobin: 11.7 g/dL — ABNORMAL LOW (ref 12.0–16.0)
Lymphocytes Relative: 22 %
Lymphs Abs: 2.4 10*3/uL (ref 1.0–3.6)
MCH: 26.5 pg (ref 26.0–34.0)
MCHC: 32.5 g/dL (ref 32.0–36.0)
MCV: 81.4 fL (ref 80.0–100.0)
Monocytes Absolute: 1 10*3/uL — ABNORMAL HIGH (ref 0.2–0.9)
Monocytes Relative: 9 %
Neutro Abs: 7.1 10*3/uL — ABNORMAL HIGH (ref 1.4–6.5)
Neutrophils Relative %: 63 %
Platelets: 192 10*3/uL (ref 150–440)
RBC: 4.42 MIL/uL (ref 3.80–5.20)
RDW: 16.4 % — ABNORMAL HIGH (ref 11.5–14.5)
WBC: 11.2 10*3/uL — ABNORMAL HIGH (ref 3.6–11.0)

## 2017-07-11 LAB — CHLAMYDIA/NGC RT PCR (ARMC ONLY)
Chlamydia Tr: NOT DETECTED
N gonorrhoeae: DETECTED — AB

## 2017-07-11 LAB — TSH: TSH: 0.813 u[IU]/mL (ref 0.350–4.500)

## 2017-07-11 MED ORDER — MEDROXYPROGESTERONE ACETATE 10 MG PO TABS
10.0000 mg | ORAL_TABLET | Freq: Every day | ORAL | Status: DC
  Filled 2017-07-11: qty 1

## 2017-07-11 MED ORDER — MEDROXYPROGESTERONE ACETATE 10 MG PO TABS: 10.0000 mg | ORAL_TABLET | Freq: Every day | ORAL | 0 refills | Status: DC

## 2017-07-11 NOTE — ED Provider Notes (Signed)
I reviewed patient's gonorrhea and Chlamydia testing which were pending at the time the patient was discharged home.  Gonorrhea was positive,, and it was negative.  I left a message with patient's mom to call for test results which was listed as emergency contact because the patient's number was disconnected.  Patient did call me back and I was able to give her the test results and let her know that she needs to come back to the emergency department or elsewhere to get STD treatment Rocephin shot for gonorrhea.  We also discussed that she needs to tell any sexual partners to get tested and treated.   Sandra Hines, Sandra Wofford, MD 07/11/17 [phone]

## 2017-07-11 NOTE — ED Notes (Addendum)
Pt states she would prefer to leave now and pick up Rx instead of waiting for dose to be delivered. EDP Lord notified, MD okay with pt's preferred plan.

## 2017-07-11 NOTE — ED Notes (Signed)
Pharmacy called for provera tablet

## 2017-07-11 NOTE — Discharge Instructions (Signed)
You are evaluated for irregular and heavy vaginal bleeding, and your exam and evaluation are overall reassuring in the emergency department today.  You are started on progesterone hormone pill called Provera to help reduce the bleeding.  You do however need to be seen by OB/GYN to discuss any further investigation which could possibly include endometrial biopsy to look for endometrial cancer.  You also need to be seen to evaluate the swelling on the inside of the vaginal opening.

## 2017-07-11 NOTE — ED Provider Notes (Signed)
[facility] Emergency Department Provider Note ____________________________________________   I have reviewed the triage vital signs and the triage nursing note.  HISTORY  Chief Complaint Vaginal Bleeding   Historian Patient  HPI Sandra Hines is a 46 y.o. female presents for irregular and heavy vaginal bleeding.  She states that her periods are typically regular, she started her period about 3 weeks ago.  She had a normal period for about a week and it seemed like that for a week and then this past week it started heavy with clots again.  Mild abdominal cramping.  She is never had really heavy or irregular bleeding before.  She took a pregnancy test at home and it was negative.  No other vaginal discharge.  No nausea or sweats.  Bleeding is moderate.  She has not had dizziness or passing out.   History reviewed. No pertinent past medical history.  There are no active problems to display for this patient.   History reviewed. No pertinent surgical history.  Prior to Admission medications   Medication Sig Start Date End Date Taking? Authorizing Provider  brompheniramine-pseudoephedrine-DM 30-2-10 MG/5ML syrup Take 5 mLs by mouth 4 (four) times daily as needed. Mixed with 5 mL of viscous lidocaine for swish and swallow. Patient not taking: Reported on 07/11/2017 08/23/15   Joni Reining, PA-C  clindamycin (CLEOCIN) 300 MG capsule Take 1 capsule (300 mg total) by mouth 3 (three) times daily. Patient not taking: Reported on 07/11/2017 09/09/15   Jennye Moccasin, MD  desoximetasone (TOPICORT) 0.25 % cream Apply 1 application topically 2 (two) times daily. Patient not taking: Reported on 07/11/2017 08/23/15   Joni Reining, PA-C  diphenhydrAMINE (BENYLIN) 12.5 MG/5ML syrup Take 5 mLs (12.5 mg total) by mouth 4 (four) times daily as needed for allergies. 5 mL of viscous lidocaine for swish and swallow. Patient not taking: Reported on 07/11/2017 07/07/15   Joni Reining, PA-C  doxycycline (VIBRAMYCIN) 50 MG capsule Take 2 capsules (100 mg total) by mouth 2 (two) times daily. Patient not taking: Reported on 07/11/2017 04/11/15   Irean Hong, MD  HYDROcodone-acetaminophen [facility]) 5-325 MG tablet Take 1 tablet by mouth every 6 (six) hours as needed for moderate pain. Patient not taking: Reported on 07/11/2017 09/09/15   Jennye Moccasin, MD  lidocaine (XYLOCAINE) 2 % solution Use as directed 5 mLs in the mouth or throat every 6 (six) hours as needed for mouth pain. Mixed with 5 mL of Benadryl for swish and swallow. Patient not taking: Reported on 07/11/2017 07/07/15   Joni Reining, PA-C  lidocaine (XYLOCAINE) 2 % solution Use as directed 5 mLs in the mouth or throat every 6 (six) hours as needed for mouth pain. 5 mL mixed with Benadryl for swish and swallow. Patient not taking: Reported on 07/11/2017 08/23/15   Joni Reining, PA-C  medroxyPROGESTERone (PROVERA) 10 MG tablet Take 1 tablet (10 mg total) by mouth daily. 07/11/17 07/11/18  Governor Rooks, MD  metroNIDAZOLE (FLAGYL) 500 MG tablet Take 1 tablet (500 mg total) by mouth 2 (two) times daily. Patient not taking: Reported on 07/11/2017 04/11/15   Irean Hong, MD  neomycin-polymyxin-pramoxine (NEOSPORIN PLUS) 1 % cream Apply topically 2 (two) times daily. Patient not taking: Reported on 07/11/2017 08/23/15   Joni Reining, PA-C    Allergies  Allergen Reactions  . Penicillins Hives    Has patient had a PCN reaction causing immediate rash, facial/tongue/throat swelling, SOB or lightheadedness with  hypotension: No Has patient had a PCN reaction causing severe rash involving mucus membranes or skin necrosis: No Has patient had a PCN reaction that required hospitalization: No Has patient had a PCN reaction occurring within the last 10 years: No If all of the above answers are "NO", then may proceed with Cephalosporin use.     History reviewed. No pertinent family history.  Social  History Social History   Tobacco Use  . Smoking status: Current Every Day Smoker    Packs/day: 1.00    Types: Cigarettes  . Smokeless tobacco: Never Used  Substance Use Topics  . Alcohol use: No  . Drug use: No    Review of Systems  Constitutional: Negative for fever. Eyes: Negative for visual changes. ENT: Negative for sore throat. Cardiovascular: Negative for chest pain. Respiratory: Negative for shortness of breath. Gastrointestinal: Mild abdominal cramping intermittently. Genitourinary: Negative for dysuria.  Positive for irregular and heavy vaginal bleeding as per HPI. Musculoskeletal: Negative for back pain. Skin: Negative for rash. Neurological: Negative for headache.  ____________________________________________   PHYSICAL EXAM:  VITAL SIGNS: ED Triage Vitals  Enc Vitals Group     BP 07/11/17 0832 (!) 173/116     Pulse Rate 07/11/17 0832 88     Resp 07/11/17 0832 20     Temp 07/11/17 0832 98.1 F (36.7 C)     Temp Source 07/11/17 0832 Oral     SpO2 07/11/17 0832 100 %     Weight 07/11/17 0833 220 lb (99.8 kg)     Height 07/11/17 0833 5\' 7"  (1.702 m)     Head Circumference --      Peak Flow --      Pain Score --      Pain Loc --      Pain Edu? --      Excl. in GC? --      Constitutional: Alert and oriented. Well appearing and in no distress. HEENT   Head: Normocephalic and atraumatic.      Eyes: Conjunctivae are normal. Pupils equal and round.       Ears:         Nose: No congestion/rhinnorhea.   Mouth/Throat: Mucous membranes are moist.   Neck: No stridor. Cardiovascular/Chest: Normal rate, regular rhythm.  No murmurs, rubs, or gallops. Respiratory: Normal respiratory effort without tachypnea nor retractions. Breath sounds are clear and equal bilaterally. No wheezes/rales/rhonchi. Gastrointestinal: Soft. No distention, no guarding, no rebound. Nontender.  Genitourinary/rectal: Dark vaginal blood in the vaginal vault.  No cervical motion  tenderness.  She does have a half dollar diameter nontender, very soft mass just to the left inner vaginal wall. Musculoskeletal: Nontender with normal range of motion in all extremities. No joint effusions.  No lower extremity tenderness.  No edema. Neurologic:  Normal speech and language. No gross or focal neurologic deficits are appreciated. Skin:  Skin is warm, dry and intact. No rash noted. Psychiatric: Mood and affect are normal. Speech and behavior are normal. Patient exhibits appropriate insight and judgment.   ____________________________________________  LABS (pertinent positives/negatives) I, Governor Rooksebecca Sherryn Pollino, MD the attending physician have reviewed the labs noted below.  Labs Reviewed  WET PREP, GENITAL - Abnormal; Notable for the following components:      Result Value   Clue Cells Wet Prep HPF POC PRESENT (*)    WBC, Wet Prep HPF POC RARE (*)    All other components within normal limits  BASIC METABOLIC PANEL - Abnormal; Notable for the following components:  Calcium 8.5 (*)    All other components within normal limits  CBC WITH DIFFERENTIAL/PLATELET - Abnormal; Notable for the following components:   WBC 11.2 (*)    Hemoglobin 11.7 (*)    RDW 16.4 (*)    Neutro Abs 7.1 (*)    Monocytes Absolute 1.0 (*)    All other components within normal limits  CHLAMYDIA/NGC RT PCR ([facility] ONLY)  TSH  POC URINE PREG, ED  POCT PREGNANCY, URINE    ____________________________________________    EKG I, Governor Rooksebecca Tylene Quashie, MD, the attending physician have personally viewed and interpreted all ECGs.  None ____________________________________________  RADIOLOGY All Xrays were viewed by me.  Imaging interpreted by Radiologist, and I, Governor Rooksebecca Chinelo Benn, MD the attending physician have reviewed the radiologist interpretation noted below.  Ultrasound pelvic and transvaginal:  IMPRESSION: Normal pelvic  ultrasound. __________________________________________  PROCEDURES  Procedure(s) performed: None  Critical Care performed: None   ____________________________________________  ED COURSE / ASSESSMENT AND PLAN  Pertinent labs & imaging results that were available during my care of the patient were reviewed by me and considered in my medical decision making (see chart for details).   Pregnancy test negative.  She is having heavy and irregular vaginal bleeding.  No hypotension.  Hemoglobin is okay and stable from prior.  On vaginal exam she does have dark blood clots, and I discussed with her placing her on a course of Provera.  She does need to be seen by OB/GYN for further investigation into the abnormal and heavy bleeding as well.  She does have this very soft mass in the location of a Bartholin cyst, but does not feel like a Bartholin cyst because it is so soft and nontender.  From emergency standpoint I do not think it needs any further investigation here in the ER, but I do want her to see her OB/GYN for further investigation of this.  DIFFERENTIAL DIAGNOSIS: Occluding but not limited to fibroids, hormonal irregularities, trauma, endometrial cancer, pregnancy/miscarriage, etc.  CONSULTATIONS:  None   Patient / Family / Caregiver informed of clinical course, medical decision-making process, and agree with plan.   I discussed return precautions, follow-up instructions, and discharge instructions with patient and/or family.  Discharge Instructions : You are evaluated for irregular and heavy vaginal bleeding, and your exam and evaluation are overall reassuring in the emergency department today.  You are started on progesterone hormone pill called Provera to help reduce the bleeding.  You do however need to be seen by OB/GYN to discuss any further investigation which could possibly include endometrial biopsy to look for endometrial cancer.  You also need to be seen to evaluate the  swelling on the inside of the vaginal opening.    ___________________________________________   FINAL CLINICAL IMPRESSION(S) / ED DIAGNOSES   Final diagnoses:  Dysfunctional uterine bleeding  Menorrhagia with regular cycle      ___________________________________________        Note: This dictation was prepared with Dragon dictation. Any transcriptional errors that result from this process are unintentional    Governor RooksLord, Aleynah Rocchio, MD 07/11/17 1142

## 2017-07-11 NOTE — ED Notes (Signed)
FIRST NURSE NOTE:  Pt reports vaginal bleeding x 2 weeks, states she has had clots as well. Pt states she is constantly changing her pad and having to use small towels or shirts to soak up the bleeding.

## 2017-07-11 NOTE — ED Notes (Signed)

## 2017-07-11 NOTE — ED Triage Notes (Signed)
C/o vaginal bleeding X 2 weeks with clots. Was normal period but never ended.  No pain. Soaking pads and messing up clothes per pt. Ambulatory to triage.

## 2017-07-11 NOTE — ED Notes (Signed)
Patient transported to Ultrasound 

## 2017-07-15 ENCOUNTER — Telehealth: Payer: Self-pay | Admitting: Emergency Medicine

## 2017-07-15 NOTE — Telephone Encounter (Addendum)
Called patient to inform need for treatment for gonorrhea.  Her phone is not in service, and the physician has already called patient's relative, and was able to talk to patient.  I do not see that she has returned for treatment since.  I will send letter asking her to call me to determine if she was treated elsewhere.

## 2017-09-05 ENCOUNTER — Telehealth: Payer: Self-pay | Admitting: Licensed Clinical Social Worker

## 2017-09-05 ENCOUNTER — Ambulatory Visit: Payer: Self-pay | Admitting: Licensed Clinical Social Worker

## 2017-09-05 NOTE — Telephone Encounter (Signed)
Clinician reached out to [employer] via email in regards to a new client referral to [facility] for [facility] and this is the second appointment that the client has not showed to.   Response received from staff that they make every effort to make sure that the referrals are made and assist clients with removing any barriers to making their scheduled appointments. She explained that in regards to Ms. Sandra Hines, arrangement were made for reliable transportation to her appointment at [facility].

## 2017-09-10 ENCOUNTER — Telehealth: Payer: Self-pay | Admitting: Licensed Clinical Social Worker

## 2017-09-10 ENCOUNTER — Ambulatory Visit: Payer: Self-pay | Admitting: Licensed Clinical Social Worker

## 2017-09-10 NOTE — Telephone Encounter (Signed)
Provider contacted the patient to see if she could come in at 4:15 pm instead of 3:30 pm this afternoon due to her having a training scheduled from 2 pm to 4pm. Clinician left a voicemail with call back information.

## 2017-10-08 ENCOUNTER — Other Ambulatory Visit: Payer: Self-pay

## 2017-10-08 ENCOUNTER — Encounter: Payer: Self-pay | Admitting: Emergency Medicine

## 2017-10-08 DIAGNOSIS — I608 Other nontraumatic subarachnoid hemorrhage: Secondary | ICD-10-CM | POA: Insufficient documentation

## 2017-10-08 DIAGNOSIS — I10 Essential (primary) hypertension: Secondary | ICD-10-CM | POA: Insufficient documentation

## 2017-10-08 DIAGNOSIS — M542 Cervicalgia: Secondary | ICD-10-CM | POA: Insufficient documentation

## 2017-10-08 DIAGNOSIS — F17210 Nicotine dependence, cigarettes, uncomplicated: Secondary | ICD-10-CM | POA: Insufficient documentation

## 2017-10-08 DIAGNOSIS — Z79899 Other long term (current) drug therapy: Secondary | ICD-10-CM | POA: Insufficient documentation

## 2017-10-08 NOTE — ED Triage Notes (Signed)
Pt arrived to the ED for complaints of headache, neck pain and inability to move the left leg. Pt reports that she was at home when suddenly she began to experience headache and neck pain followed by inability to move her left leg that quickly resolve. Pt is having a hard time concentrating and answering questions. Pt is AOx4, MD notified.

## 2017-10-09 ENCOUNTER — Emergency Department: Payer: Self-pay

## 2017-10-09 ENCOUNTER — Emergency Department
Admission: EM | Admit: 2017-10-09 | Discharge: 2017-10-09 | Disposition: A | Discharge status: Other Acute Inpatient Hospital | Payer: Self-pay | Attending: Emergency Medicine | Admitting: Emergency Medicine

## 2017-10-09 DIAGNOSIS — I10 Essential (primary) hypertension: Secondary | ICD-10-CM

## 2017-10-09 DIAGNOSIS — I609 Nontraumatic subarachnoid hemorrhage, unspecified: Secondary | ICD-10-CM

## 2017-10-09 LAB — BASIC METABOLIC PANEL
Anion gap: 10 (ref 5–15)
BUN: 16 mg/dL (ref 6–20)
CO2: 23 mmol/L (ref 22–32)
Calcium: 9.1 mg/dL (ref 8.9–10.3)
Chloride: 106 mmol/L (ref 101–111)
Creatinine, Ser: 0.97 mg/dL (ref 0.44–1.00)
GFR calc Af Amer: >60 mL/min (ref >60)
GFR calc non Af Amer: >60 mL/min (ref >60)
Glucose, Bld: 100 mg/dL — ABNORMAL HIGH (ref 65–99)
Potassium: 3.7 mmol/L (ref 3.5–5.1)
Sodium: 139 mmol/L (ref 135–145)

## 2017-10-09 LAB — CBC WITH DIFFERENTIAL/PLATELET
Basophils Absolute: 0.1 10*3/uL (ref 0–0.1)
Basophils Relative: 1 %
Eosinophils Absolute: 0.2 10*3/uL (ref 0–0.7)
Eosinophils Relative: 3 %
HCT: 36.4 % (ref 35.0–47.0)
Hemoglobin: 11.4 g/dL — ABNORMAL LOW (ref 12.0–16.0)
Lymphocytes Relative: 33 %
Lymphs Abs: 3 10*3/uL (ref 1.0–3.6)
MCH: 24.8 pg — ABNORMAL LOW (ref 26.0–34.0)
MCHC: 31.4 g/dL — ABNORMAL LOW (ref 32.0–36.0)
MCV: 79.2 fL — ABNORMAL LOW (ref 80.0–100.0)
Monocytes Absolute: 0.7 10*3/uL (ref 0.2–0.9)
Monocytes Relative: 8 %
Neutro Abs: 5.1 10*3/uL (ref 1.4–6.5)
Neutrophils Relative %: 55 %
Platelets: 175 10*3/uL (ref 150–440)
RBC: 4.6 MIL/uL (ref 3.80–5.20)
RDW: 15.5 % — ABNORMAL HIGH (ref 11.5–14.5)
WBC: 9.2 10*3/uL (ref 3.6–11.0)

## 2017-10-09 MED ORDER — SODIUM CHLORIDE 0.9 % IV SOLN
Freq: Once | INTRAVENOUS | Status: AC
  Administered 2017-10-09: 01:00:00 via INTRAVENOUS

## 2017-10-09 MED ORDER — FENTANYL CITRATE (PF) 100 MCG/2ML IJ SOLN
50.0000 ug | Freq: Once | INTRAMUSCULAR | Status: AC
  Administered 2017-10-09: 50 ug via INTRAVENOUS
  Filled 2017-10-09: qty 2

## 2017-10-09 MED ORDER — NICARDIPINE HCL IN NACL 20-0.86 MG/200ML-% IV SOLN
0.0000 mg/h | INTRAVENOUS | Status: DC
  Administered 2017-10-09: 5 mg/h via INTRAVENOUS
  Filled 2017-10-09: qty 200

## 2017-10-09 MED ORDER — ONDANSETRON HCL 4 MG/2ML IJ SOLN
INTRAMUSCULAR | Status: AC
  Administered 2017-10-09: 4 mg via INTRAVENOUS
  Filled 2017-10-09: qty 2

## 2017-10-09 MED ORDER — METOCLOPRAMIDE HCL 5 MG/ML IJ SOLN
INTRAMUSCULAR | Status: AC
  Filled 2017-10-09: qty 2

## 2017-10-09 MED ORDER — IOPAMIDOL (ISOVUE-370) INJECTION 76%
75.0000 mL | Freq: Once | INTRAVENOUS | Status: AC | PRN
  Administered 2017-10-09: 75 mL via INTRAVENOUS

## 2017-10-09 MED ORDER — ONDANSETRON HCL 4 MG/2ML IJ SOLN
4.0000 mg | Freq: Once | INTRAMUSCULAR | Status: AC
  Administered 2017-10-09: 4 mg via INTRAVENOUS

## 2017-10-09 MED ORDER — METOCLOPRAMIDE HCL 5 MG/ML IJ SOLN
10.0000 mg | Freq: Once | INTRAMUSCULAR | Status: AC
  Administered 2017-10-09: 10 mg via INTRAVENOUS

## 2017-10-09 NOTE — ED Notes (Signed)
Transport at bedside  

## 2017-10-09 NOTE — ED Notes (Signed)
EMTALA checked for completion  

## 2017-10-09 NOTE — ED Notes (Signed)
Pt sleeping. Able to arouse with verbal stimuli. Pt states she doesn't want us to call any of her family at this time.

## 2017-10-09 NOTE — ED Notes (Signed)
Voided lg amt yellow urine

## 2017-10-09 NOTE — ED Notes (Signed)
Patient transported to CT. Able to answer questions for CT tech

## 2017-10-09 NOTE — ED Notes (Signed)
Pt entered room vomiting lg amts undigested food. Last ate at 6pm. C/o headache all day worse now.  Dr Zenda AlpersWebster at bedside. Explained to pt she has a bleed in her head. Pt tearful and upset. [facility]

## 2017-10-09 NOTE — ED Provider Notes (Signed)
[facility] Emergency Department Provider Note   ____________________________________________   First MD Initiated Contact with Patient 10/09/17 [id number]     (approximate)  I have reviewed the triage vital signs and the nursing notes.   HISTORY  Chief Complaint Headache and Neck Pain    HPI Sandra Hines is a 47 y.o. female who comes into the hospital today with a headache that started suddenly right before she came in.  The patient states that she became sweaty and started having pain down the back of her neck.  The patient states that she tried to get some air but she could not stand up.  The patient reports that her left leg felt very weak.  The patient called to the door but states that she could not move her leg.  The patient came into the hospital for evaluation today.  She does have a history of headaches but reports that her pain is currently a 10 out of 10 in intensity.  The patient feels tired and has been having some nausea and vomiting.  She did not take anything for the pain at home she just came right into be seen.  The patient states that this is the worst headache that she has ever had.  History reviewed. No pertinent past medical history.  There are no active problems to display for this patient.   History reviewed. No pertinent surgical history.  Prior to Admission medications   Medication Sig Start Date End Date Taking? Authorizing Provider  brompheniramine-pseudoephedrine-DM 30-2-10 MG/5ML syrup Take 5 mLs by mouth 4 (four) times daily as needed. Mixed with 5 mL of viscous lidocaine for swish and swallow. Patient not taking: Reported on 07/11/2017 08/23/15   Joni Reining, PA-C  clindamycin (CLEOCIN) 300 MG capsule Take 1 capsule (300 mg total) by mouth 3 (three) times daily. Patient not taking: Reported on 07/11/2017 09/09/15   Jennye Moccasin, MD  desoximetasone (TOPICORT) 0.25 % cream Apply 1 application topically 2 (two) times  daily. Patient not taking: Reported on 07/11/2017 08/23/15   Joni Reining, PA-C  diphenhydrAMINE (BENYLIN) 12.5 MG/5ML syrup Take 5 mLs (12.5 mg total) by mouth 4 (four) times daily as needed for allergies. 5 mL of viscous lidocaine for swish and swallow. Patient not taking: Reported on 07/11/2017 07/07/15   Joni Reining, PA-C  doxycycline (VIBRAMYCIN) 50 MG capsule Take 2 capsules (100 mg total) by mouth 2 (two) times daily. Patient not taking: Reported on 07/11/2017 04/11/15   Irean Hong, MD  HYDROcodone-acetaminophen [facility]) 5-325 MG tablet Take 1 tablet by mouth every 6 (six) hours as needed for moderate pain. Patient not taking: Reported on 07/11/2017 09/09/15   Jennye Moccasin, MD  lidocaine (XYLOCAINE) 2 % solution Use as directed 5 mLs in the mouth or throat every 6 (six) hours as needed for mouth pain. Mixed with 5 mL of Benadryl for swish and swallow. Patient not taking: Reported on 07/11/2017 07/07/15   Joni Reining, PA-C  lidocaine (XYLOCAINE) 2 % solution Use as directed 5 mLs in the mouth or throat every 6 (six) hours as needed for mouth pain. 5 mL mixed with Benadryl for swish and swallow. Patient not taking: Reported on 07/11/2017 08/23/15   Joni Reining, PA-C  medroxyPROGESTERone (PROVERA) 10 MG tablet Take 1 tablet (10 mg total) by mouth daily. 07/11/17 07/11/18  Governor Rooks, MD  metroNIDAZOLE (FLAGYL) 500 MG tablet Take 1 tablet (500 mg total) by mouth 2 (two)  times daily. Patient not taking: Reported on 07/11/2017 04/11/15   Irean Hong, MD  neomycin-polymyxin-pramoxine (NEOSPORIN PLUS) 1 % cream Apply topically 2 (two) times daily. Patient not taking: Reported on 07/11/2017 08/23/15   Joni Reining, PA-C    Allergies Penicillins  History reviewed. No pertinent family history.  Social History Social History   Tobacco Use  . Smoking status: Current Every Day Smoker    Packs/day: 1.00    Types: Cigarettes  . Smokeless tobacco: Never Used  Substance Use  Topics  . Alcohol use: No  . Drug use: No    Review of Systems  Constitutional: Diaphoresis Eyes: No visual changes. ENT: No sore throat. Cardiovascular: Denies chest pain. Respiratory: Denies shortness of breath. Gastrointestinal: Nausea and vomiting with no abdominal pain.    Genitourinary: Negative for dysuria. Musculoskeletal: neck pain. Skin: Negative for rash. Neurological: Headache and left lower extremity weakness   ____________________________________________   PHYSICAL EXAM:  VITAL SIGNS: ED Triage Vitals [10/08/17 2337]  Enc Vitals Group     BP (!) 159/91     Pulse Rate 83     Resp 18     Temp 98.2 F (36.8 C)     Temp Source Oral     SpO2 100 %     Weight 221 lb (100.2 kg)     Height 5\' 7"  (1.702 m)     Head Circumference      Peak Flow      Pain Score 10     Pain Loc      Pain Edu?      Excl. in GC?     Constitutional: Alert and oriented. Tired appearing and in moderate distress. Eyes: Conjunctivae are normal. PERRL. EOMI. Head: Atraumatic. Nose: No congestion/rhinnorhea. Mouth/Throat: Mucous membranes are moist.  Oropharynx non-erythematous. Cardiovascular: Normal rate, regular rhythm. Grossly normal heart sounds.  Good peripheral circulation. Respiratory: Normal respiratory effort.  No retractions. Lungs CTAB. Gastrointestinal: Soft and nontender. No distention.  Positive bowel sounds Musculoskeletal: No lower extremity tenderness nor edema.   Neurologic:  Normal speech and language.  Cranial nerves II through XII are grossly intact patient does have some 3 out of 5 strength in her left lower extremity as she is able to lift it against gravity with some difficulty but not against resistance.  Patient has some decreased sensation in her left lower extremity patient also has some generalized weakness. GCS 15 Skin:  Diaphoretic, cool and pale Psychiatric: Mood and affect are normal. Speech and behavior are  normal.  ____________________________________________   LABS (all labs ordered are listed, but only abnormal results are displayed)  Labs Reviewed  BASIC METABOLIC PANEL - Abnormal; Notable for the following components:      Result Value   Glucose, Bld 100 (*)    All other components within normal limits  CBC WITH DIFFERENTIAL/PLATELET - Abnormal; Notable for the following components:   Hemoglobin 11.4 (*)    MCV 79.2 (*)    MCH 24.8 (*)    MCHC 31.4 (*)    RDW 15.5 (*)    All other components within normal limits   ____________________________________________  EKG  ED ECG REPORT I, Rebecka Apley, the attending physician, personally viewed and interpreted this ECG.   Date: 10/08/2017  EKG Time: 2336  Rate: 82  Rhythm: normal EKG, normal sinus rhythm, normal sinus rhythm  Axis: normal  Intervals:none  ST&T Change: none  ____________________________________________  RADIOLOGY  ED MD interpretation:  CT head: Acute  subarachnoid hemorrhage involving the left sylvian fissure and basilar cisterns suspicious for ruptured aneurysm, CTA of the head is recommended for further correlation.  Official radiology report(s): Ct Head Wo Contrast  Result Date: 10/09/2017 CLINICAL DATA:  Headache and neck pain with difficulty moving leg. EXAM: CT HEAD WITHOUT CONTRAST TECHNIQUE: Contiguous axial images were obtained from the base of the skull through the vertex without intravenous contrast. COMPARISON:  None. FINDINGS: Brain: Acute subarachnoid hemorrhage in the left sylvian fissure and basilar cisterns in particular the suprasellar, ambient and quadrigeminal plate raise concern for ruptured aneurysm as a leading consideration. No acute intraparenchymal hemorrhage. No edema or midline shift. No herniation. No intra-axial mass nor extra-axial fluid collections. No large vascular territory infarct. Vascular: No hyperdense vessel or unexpected calcification. Skull: Normal Sinuses/Orbits:  Normal Other: None IMPRESSION: Acute subarachnoid hemorrhage involving the left sylvian fissure and basilar cisterns suspicious for ruptured aneurysm. CTA of the head is recommended for further correlation. These results were called by telephone at the time of interpretation on 10/09/2017 at 12:28 am to Dr. Lucrezia EuropeALLISON Aarav Burgett , who verbally acknowledged these results. Electronically Signed   By: Tollie Ethavid  Kwon M.D.   On: 10/09/2017 00:29    ____________________________________________   PROCEDURES  Procedure(s) performed: please, see procedure note(s).  .Critical Care Performed by: Rebecka ApleyWebster, Cypher Paule P, MD Authorized by: Rebecka ApleyWebster, Shaylinn Hladik P, MD   Critical care provider statement:    Critical care time (minutes):  30   Critical care start time:  10/09/2017 12:27 AM   Critical care end time:  10/09/2017 12:57 AM   Critical care time was exclusive of:  Separately billable procedures and treating other patients   Critical care was necessary to treat or prevent imminent or life-threatening deterioration of the following conditions: subarachnoid hemorrhage.   Critical care was time spent personally by me on the following activities:  Development of treatment plan with patient or surrogate, discussions with consultants, evaluation of patient's response to treatment, examination of patient, obtaining history from patient or surrogate, ordering and performing treatments and interventions, ordering and review of laboratory studies, ordering and review of radiographic studies, pulse oximetry, re-evaluation of patient's condition and review of old charts   I assumed direction of critical care for this patient from another provider in my specialty: no      Critical Care performed: Yes, see critical care note(s)  ____________________________________________   INITIAL IMPRESSION / ASSESSMENT AND PLAN / ED COURSE  As part of my medical decision making, I reviewed the following data within the electronic medical  record:  Notes from prior ED visits   This is a 47 year old female who comes into the hospital today with an acute headache.  The patient also developed some nausea vomiting and left lower extremity weakness with this headache.    My differential diagnosis includes migraine, CVA, intracranial hemorrhage.  The patient was sent for a CT scan of her head and I did receive a phone call from radiology alerting me to the fact that the patient had an acute subarachnoid hemorrhage involving the left sylvian fissure.  The recommendation was for the patient to receive a CTA of her head.  The patient was brought immediately to her room where she was evaluated.  The patient was vomiting as she arrived into the room.  I ordered a dose of Reglan to help with the patient's vomiting and she was placed on the monitor.  I discovered that the patient's blood pressure was in the 170s over 100s so I  also ordered a nicardipine drip to help manage the patient's blood pressure.  The blood pressure goal would be 130s over 80s.  I contacted the [facility] transfer center given the patient's subarachnoid hemorrhage to arrange a transfer.  I spoke with Dr. Sherryll Burger who is the neuro intensivist.  He did agree with the plan to start the patient on a nicardipine drip and as the patient's GCS is 15 we will not consider intubation at this time.  The patient will be sent to receive her CTA and she will be transferred over to [facility] for neurosurgical intervention.  I will give the patient some fentanyl for her headache as well and she has been started on nicardipine.  We will await a neuro ICU bed for the patient and critical care transport.     ----------------------------------------- 1:44 AM on 10/09/2017 -----------------------------------------  [facility] is currently here unavailable to take the patient to [facility].  The patient is sleepy but arousable.  Her GCS is still 15.  I will give the patient a dose of fentanyl.  She will be  transferred to [facility].  ____________________________________________   FINAL CLINICAL IMPRESSION(S) / ED DIAGNOSES  Final diagnoses:  Subarachnoid hemorrhage (HCC)  Hypertension, unspecified type     ED Discharge Orders    None       Note:  This document was prepared using Dragon voice recognition software and may include unintentional dictation errors.    Rebecka Apley, MD 10/09/17 [phone]

## 2017-10-21 ENCOUNTER — Telehealth: Payer: Self-pay | Admitting: Pharmacist

## 2017-10-21 NOTE — Telephone Encounter (Signed)
Sandra Hines brought in a Rx for nimodipine 30mg - 2 capsules every 4 hours for vasospasms. [facility] this medication due to cost ($324).   [facility] the [facility], at 4:30 pm, today to let her know [facility] not be [facility] to [facility] the medication. I called [name] and they do not stock this medication. I also [facility] and they do not have this medication in stock. The patient called [facility] and they would have to special order the medication for tomorrow; patient cost $406. I tried calling the PA back around 5pm to let her know that I was not able to find the medication; she did not call back. Sandra Hines called at 5:15 pm to tell me the neurosurgeon was going to have the Case Manager fill the Rx at [facility] at no cost to the patient.  [name], BS, PharmD [facility]Ph[facility]acy Operations Coordinator [phone]

## 2017-10-30 ENCOUNTER — Other Ambulatory Visit: Payer: Self-pay

## 2017-10-30 ENCOUNTER — Emergency Department
Admission: EM | Admit: 2017-10-30 | Discharge: 2017-10-30 | Disposition: A | Discharge status: Home / Self Care | Payer: Self-pay | Attending: Emergency Medicine | Admitting: Emergency Medicine

## 2017-10-30 DIAGNOSIS — S0101XD Laceration without foreign body of scalp, subsequent encounter: Secondary | ICD-10-CM | POA: Insufficient documentation

## 2017-10-30 DIAGNOSIS — Z79899 Other long term (current) drug therapy: Secondary | ICD-10-CM | POA: Insufficient documentation

## 2017-10-30 DIAGNOSIS — F17210 Nicotine dependence, cigarettes, uncomplicated: Secondary | ICD-10-CM | POA: Insufficient documentation

## 2017-10-30 DIAGNOSIS — Z4802 Encounter for removal of sutures: Secondary | ICD-10-CM

## 2017-10-30 DIAGNOSIS — X58XXXA Exposure to other specified factors, initial encounter: Secondary | ICD-10-CM | POA: Insufficient documentation

## 2017-10-30 NOTE — ED Triage Notes (Signed)
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 States surgery was 3 weeks go but sutures were placed over a week ago after drains were removed. Pt was told to here for suture removal, pt has no complaints.

## 2017-10-30 NOTE — Discharge Instructions (Addendum)
Follow-up with your surgeon as scheduled if any continued problems or concerns.  Continue to keep area clean and dry.

## 2017-10-30 NOTE — ED Notes (Signed)
See triage note  Here to have sutures removed from head  No redness or drainage noted

## 2017-10-30 NOTE — ED Provider Notes (Signed)
[facility] Emergency Department Provider Note  ____________________________________________   None    (approximate)  I have reviewed the triage vital signs and the nursing notes.   HISTORY  Chief Complaint Suture / Staple Removal   HPI Sandra Hines is a 47 y.o. female is here for suture removal.  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  Drain was removed and sutures were placed 1 week ago.  She was told to come to the emergency department for suture removal.  She denies any difficulty or concerns of infection.  No past medical history on file.  There are no active problems to display for this patient.   No past surgical history on file.  Prior to Admission medications   Medication Sig Start Date End Date Taking? Authorizing Provider  medroxyPROGESTERone (PROVERA) 10 MG tablet Take 1 tablet (10 mg total) by mouth daily. 07/11/17 07/11/18  Governor Rooks, MD    Allergies Penicillins  No family history on file.  Social History Social History   Tobacco Use  . Smoking status: Current Every Day Smoker    Packs/day: 1.00    Types: Cigarettes  . Smokeless tobacco: Never Used  Substance Use Topics  . Alcohol use: No  . Drug use: No    Review of Systems Constitutional: No fever/chills Cardiovascular: Denies chest pain. Respiratory: Denies shortness of breath. Musculoskeletal: Negative for back pain. Skin: Positive for suture area. ____________________________________________   PHYSICAL EXAM:  VITAL SIGNS: ED Triage Vitals [10/30/17 0625]  Enc Vitals Group     BP 129/85     Pulse Rate 69     Resp 20     Temp 98 F (36.7 C)     Temp Source Oral     SpO2 100 %     Weight 205 lb (93 kg)     Height 5\' 7"  (1.702 m)     Head Circumference      Peak Flow      Pain Score 0     Pain Loc      Pain Edu?      Excl. in GC?    Constitutional: Alert and oriented. Well appearing and in no acute distress. Eyes: Conjunctivae are  normal.  Head: Atraumatic. Neck: No stridor.  Respiratory: Normal respiratory effort.  No retractions. Neurologic:  Normal speech and language. No gross focal neurologic deficits are appreciated. No gait instability. Skin:  Skin is warm, dry and intact.  Sutured area has healed well and no signs of infection. Psychiatric: Mood and affect are normal. Speech and behavior are normal.  ____________________________________________   LABS (all labs ordered are listed, but only abnormal results are displayed)  Labs Reviewed - No data to display   PROCEDURES  Procedure(s) performed: None  Procedures  Critical Care performed: No  ____________________________________________   INITIAL IMPRESSION / ASSESSMENT AND PLAN / ED COURSE  As part of my medical decision making, I reviewed the following data within the electronic MEDICAL RECORD NUMBER Notes from prior ED visits and [place] Controlled Substance Database  Sutures were removed without any difficulty.  Patient is to follow-up with her PCP and her surgeon at [facility] if any continued concerns. ____________________________________________   FINAL CLINICAL IMPRESSION(S) / ED DIAGNOSES  Final diagnoses:  Encounter for removal of sutures     ED Discharge Orders    None       Note:  This document was prepared using Dragon voice recognition software and may include unintentional  dictation errors.    Aurthur Wingerter L, PA-C 04/10Tommi Rumps/19 [id number]    Emily FilbertWilliams, Jonathan E, MD 10/30/17 [phone]

## 2018-01-01 ENCOUNTER — Emergency Department
Admission: EM | Admit: 2018-01-01 | Discharge: 2018-01-01 | Disposition: A | Discharge status: Home / Self Care | Payer: Self-pay | Attending: Emergency Medicine | Admitting: Emergency Medicine

## 2018-01-01 ENCOUNTER — Emergency Department: Payer: Self-pay

## 2018-01-01 ENCOUNTER — Other Ambulatory Visit: Payer: Self-pay

## 2018-01-01 ENCOUNTER — Encounter: Payer: Self-pay | Admitting: Emergency Medicine

## 2018-01-01 DIAGNOSIS — R519 Headache, unspecified: Secondary | ICD-10-CM

## 2018-01-01 DIAGNOSIS — F17210 Nicotine dependence, cigarettes, uncomplicated: Secondary | ICD-10-CM | POA: Insufficient documentation

## 2018-01-01 DIAGNOSIS — R51 Headache: Secondary | ICD-10-CM

## 2018-01-01 DIAGNOSIS — H6693 Otitis media, unspecified, bilateral: Secondary | ICD-10-CM | POA: Insufficient documentation

## 2018-01-01 DIAGNOSIS — H6690 Otitis media, unspecified, unspecified ear: Secondary | ICD-10-CM

## 2018-01-01 DIAGNOSIS — Z79899 Other long term (current) drug therapy: Secondary | ICD-10-CM | POA: Insufficient documentation

## 2018-01-01 MED ORDER — AMOXICILLIN-POT CLAVULANATE 875-125 MG PO TABS: 1.0000 | ORAL_TABLET | Freq: Two times a day (BID) | ORAL | 0 refills | Status: AC

## 2018-01-01 MED ORDER — AMOXICILLIN-POT CLAVULANATE 875-125 MG PO TABS
1.0000 | ORAL_TABLET | Freq: Once | ORAL | Status: AC
  Administered 2018-01-01: 1 via ORAL
  Filled 2018-01-01 (×2): qty 1

## 2018-01-01 MED ORDER — OXYCODONE-ACETAMINOPHEN 5-325 MG PO TABS
1.0000 | ORAL_TABLET | Freq: Once | ORAL | Status: AC
  Administered 2018-01-01: 1 via ORAL
  Filled 2018-01-01: qty 1

## 2018-01-01 NOTE — ED Triage Notes (Signed)
Pt reports that she has had a headache for the last week off and on, yesterday it started to be constant. Denies any N/V, states that light and sound make it worse.

## 2018-01-01 NOTE — Consult Note (Signed)
I was called for consultation by Dr. Pershing ProudSchaevitz.  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    She presents to ER with headache off and on for a week, with symptoms concerning for sinusitis.  I have reviewed her head CT, which does not show evidence of subarachnoid hemorrhage or hydrocephalus.  This is very unlikely to represent new subarachnoid hemorrhage.  I have advised treating as clinically appropriate for headache or sinusitis, or both.  I recommended she contact her treating neurosurgeon's office tomorrow to update them.  [name]

## 2018-01-01 NOTE — ED Provider Notes (Signed)
[facility] Emergency Department Provider Note ___________________________________________   First MD Initiated Contact with Patient 01/01/18 1512     (approximate)  I have reviewed the triage vital signs and the nursing notes.   HISTORY  Chief Complaint Headache  HPI Sandra Hines is a 47 y.o. female with a history of a subarachnoid hemorrhage status post coil embolization this past March was presenting to the emergency department with a left-sided headache.  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  She says that the headache this time is unlike that headache.  The headache this week has been intermittent but now constant over the past day.  The patient does not report any neck pain.  Says that the pain is a 7 out of 10 and is a sharp, stabbing pain.  She says that she also feels congestion to both ears and felt drainage to the right ear yesterday.  Denies any nasal congestion.  Says that she has had light sensitivity.  Not reporting any numbness or weakness.  History reviewed. No pertinent past medical history.  There are no active problems to display for this patient.   History reviewed. No pertinent surgical history.  Prior to Admission medications   Medication Sig Start Date End Date Taking? Authorizing Provider  medroxyPROGESTERone (PROVERA) 10 MG tablet Take 1 tablet (10 mg total) by mouth daily. 07/11/17 07/11/18  Governor Rooks, MD    Allergies Penicillins  History reviewed. No pertinent family history.  Social History Social History   Tobacco Use  . Smoking status: Current Every Day Smoker    Packs/day: 1.00    Types: Cigarettes  . Smokeless tobacco: Never Used  Substance Use Topics  . Alcohol use: No  . Drug use: No    Review of Systems  Constitutional: No fever/chills Eyes: As above ENT: No sore throat. Cardiovascular: Denies chest pain. Respiratory: Denies shortness of  breath. Gastrointestinal: No abdominal pain.  No nausea, no vomiting.  No diarrhea.  No constipation. Genitourinary: Negative for dysuria. Musculoskeletal: Negative for back pain. Skin: Negative for rash. Neurological: Negative for as above focal weakness or numbness.   ____________________________________________   PHYSICAL EXAM:  VITAL SIGNS: ED Triage Vitals  Enc Vitals Group     BP 01/01/18 1404 (!) 155/93     Pulse Rate 01/01/18 1404 88     Resp 01/01/18 1404 18     Temp 01/01/18 1404 97.8 F (36.6 C)     Temp Source 01/01/18 1404 Oral     SpO2 01/01/18 1404 100 %     Weight 01/01/18 1332 205 lb (93 kg)     Height 01/01/18 1332 5\' 6"  (1.676 m)     Head Circumference --      Peak Flow --      Pain Score 01/01/18 1331 8     Pain Loc --      Pain Edu? --      Excl. in GC? --     Constitutional: Alert and oriented. Well appearing and in no acute distress. Eyes: Conjunctivae are normal.  Head: Atraumatic.  Bilaterally opacified TMs which are bulging bilaterally with the left being greater than the right.  No perforated TMs.  No drainage at this time.  No tenderness to palpation to the mastoid processes.  No erythema over the mastoid processes. Nose: No congestion/rhinnorhea. Mouth/Throat: Mucous membranes are moist.  Neck: No stridor.   Cardiovascular: Normal rate, regular rhythm. Grossly  normal heart sounds.   Respiratory: Normal respiratory effort.  No retractions. Lungs CTAB. Gastrointestinal: Soft and nontender. No distention. No CVA tenderness. Musculoskeletal: No lower extremity tenderness nor edema.  No joint effusions. Neurologic:  Normal speech and language. No gross focal neurologic deficits are appreciated. Skin:  Skin is warm, dry and intact. No rash noted. Psychiatric: Mood and affect are normal. Speech and behavior are normal.  ____________________________________________   LABS (all labs ordered are listed, but only abnormal results are  displayed)  Labs Reviewed - No data to display ____________________________________________  EKG   ____________________________________________  RADIOLOGY  Prior aneurysm coiling.  No acute intracranial abnormality.  Partially opacified right mastoid air cells and right middle ear cavity which are new.. ____________________________________________   PROCEDURES  Procedure(s) performed:   Procedures  Critical Care performed:   ____________________________________________   INITIAL IMPRESSION / ASSESSMENT AND PLAN / ED COURSE  Pertinent labs & imaging results that were available during my care of the patient were reviewed by me and considered in my medical decision making (see chart for details).  Differential diagnosis includes, but is not limited to, intracranial hemorrhage, meningitis/encephalitis, previous head trauma, cavernous venous thrombosis, tension headache, temporal arteritis, migraine or migraine equivalent, idiopathic intracranial hypertension, and non-specific headache. As part of my medical decision making, I reviewed the following data within the electronic MEDICAL RECORD NUMBER Notes from prior ED visits  01/01/2018 at 6:16 PM.    Discussed case with Dr. Marcell BarlowYarborough of neurosurgery who recommends the patient follow-up with Dr. Jayme CloudGonzalez.  However, he does not see any acute intracranial abnormality.  I also discussed the case with Dr. Willeen CassBennett.  Patient without any tenderness to palpation of her mastoid process.  No swelling, erythema or deviation of the ear.  Will treat with outpatient anabiotic's.  Patient feeling well at this time after Percocet.  She is understanding of the treatment plan as well as the diagnosis and willing to comply.  She says that she has an allergy to penicillin but has tolerated amoxicillin in the past. ____________________________________________   FINAL CLINICAL IMPRESSION(S) / ED DIAGNOSES  Otitis media.    NEW MEDICATIONS STARTED  DURING THIS VISIT:  New Prescriptions   No medications on file     Note:  This document was prepared using Dragon voice recognition software and may include unintentional dictation errors.     Myrna BlazerSchaevitz, Koltyn Kelsay Matthew, MD 01/01/18 [phone]

## 2019-12-24 ENCOUNTER — Telehealth: Payer: Self-pay | Admitting: Obstetrics & Gynecology

## 2019-12-24 NOTE — Telephone Encounter (Signed)
[facility] [place] [facility] referring for Bartholins cyst evaluation and removal. Attempt to reach patient to schedule patient was at work and unable to schedule at that time. Will attempt to reach her at another time. [facility] ok'd 1:15 spot on Monday, 12/28/19.

## 2019-12-25 NOTE — Telephone Encounter (Signed)
Left message for patient to call office back to schedule appt.

## 2019-12-29 ENCOUNTER — Encounter: Payer: Medicaid Other | Admitting: Obstetrics and Gynecology

## 2019-12-29 NOTE — Telephone Encounter (Signed)
Patient No showed scheduled appointment. I called and left generic message with patient family to have patient call back to be reschedule.

## 2019-12-30 NOTE — Telephone Encounter (Signed)
Called and left generic message to have patient call our office

## 2020-11-26 ENCOUNTER — Other Ambulatory Visit: Payer: Self-pay

## 2020-11-26 ENCOUNTER — Emergency Department
Admission: EM | Admit: 2020-11-26 | Discharge: 2020-11-26 | Disposition: A | Discharge status: Home / Self Care | Payer: Medicaid Other | Attending: Emergency Medicine | Admitting: Emergency Medicine

## 2020-11-26 DIAGNOSIS — M545 Low back pain, unspecified: Secondary | ICD-10-CM | POA: Diagnosis present

## 2020-11-26 DIAGNOSIS — F17210 Nicotine dependence, cigarettes, uncomplicated: Secondary | ICD-10-CM | POA: Diagnosis not present

## 2020-11-26 LAB — COMPREHENSIVE METABOLIC PANEL
ALT: 13 U/L (ref 0–44)
AST: 17 U/L (ref 15–41)
Albumin: 3.6 g/dL (ref 3.5–5.0)
Alkaline Phosphatase: 46 U/L (ref 38–126)
Anion gap: 6 (ref 5–15)
BUN: 19 mg/dL (ref 6–20)
CO2: 23 mmol/L (ref 22–32)
Calcium: 8.5 mg/dL — ABNORMAL LOW (ref 8.9–10.3)
Chloride: 107 mmol/L (ref 98–111)
Creatinine, Ser: 0.83 mg/dL (ref 0.44–1.00)
GFR, Estimated: >60 mL/min (ref >60)
Glucose, Bld: 95 mg/dL (ref 70–99)
Potassium: 4.4 mmol/L (ref 3.5–5.1)
Sodium: 136 mmol/L (ref 135–145)
Total Bilirubin: 0.9 mg/dL (ref 0.3–1.2)
Total Protein: 7.4 g/dL (ref 6.5–8.1)

## 2020-11-26 LAB — CBC WITH DIFFERENTIAL/PLATELET
Abs Immature Granulocytes: 0.02 10*3/uL (ref 0.00–0.07)
Basophils Absolute: 0.1 10*3/uL (ref 0.0–0.1)
Basophils Relative: 1 %
Eosinophils Absolute: 0.2 10*3/uL (ref 0.0–0.5)
Eosinophils Relative: 3 %
HCT: 38.5 % (ref 36.0–46.0)
Hemoglobin: 12.2 g/dL (ref 12.0–15.0)
Immature Granulocytes: 0 %
Lymphocytes Relative: 29 %
Lymphs Abs: 2.3 10*3/uL (ref 0.7–4.0)
MCH: 27.4 pg (ref 26.0–34.0)
MCHC: 31.7 g/dL (ref 30.0–36.0)
MCV: 86.5 fL (ref 80.0–100.0)
Monocytes Absolute: 0.7 10*3/uL (ref 0.1–1.0)
Monocytes Relative: 9 %
Neutro Abs: 4.6 10*3/uL (ref 1.7–7.7)
Neutrophils Relative %: 58 %
Platelets: 167 10*3/uL (ref 150–400)
RBC: 4.45 MIL/uL (ref 3.87–5.11)
RDW: 15.6 % — ABNORMAL HIGH (ref 11.5–15.5)
WBC: 7.9 10*3/uL (ref 4.0–10.5)
nRBC: 0 % (ref 0.0–0.2)

## 2020-11-26 LAB — URINALYSIS, COMPLETE (UACMP) WITH MICROSCOPIC
Bacteria, UA: NONE SEEN
Bilirubin Urine: NEGATIVE
Glucose, UA: NEGATIVE mg/dL
Hgb urine dipstick: NEGATIVE
Ketones, ur: NEGATIVE mg/dL
Leukocytes,Ua: NEGATIVE
Nitrite: NEGATIVE
Protein, ur: NEGATIVE mg/dL
Specific Gravity, Urine: 1.026 (ref 1.005–1.030)
pH: 5 (ref 5.0–8.0)

## 2020-11-26 LAB — POC URINE PREG, ED: Preg Test, Ur: NEGATIVE

## 2020-11-26 MED ORDER — SODIUM CHLORIDE 0.9 % IV BOLUS
1000.0000 mL | Freq: Once | INTRAVENOUS | Status: AC
  Administered 2020-11-26: 1000 mL via INTRAVENOUS

## 2020-11-26 MED ORDER — MELOXICAM 15 MG PO TABS: 15.0000 mg | ORAL_TABLET | Freq: Every day | ORAL | 0 refills | Status: DC

## 2020-11-26 MED ORDER — CYCLOBENZAPRINE HCL 5 MG PO TABS: ORAL_TABLET | ORAL | 0 refills | Status: DC

## 2020-11-26 MED ORDER — MORPHINE SULFATE (PF) 4 MG/ML IV SOLN
4.0000 mg | Freq: Once | INTRAVENOUS | Status: AC
  Administered 2020-11-26: 4 mg via INTRAVENOUS
  Filled 2020-11-26: qty 1

## 2020-11-26 NOTE — ED Provider Notes (Signed)
[facility] Emergency Department Provider Note   ____________________________________________   Event Date/Time   First MD Initiated Contact with Patient 11/26/20 [phone]     (approximate)  I have reviewed the triage vital signs and the nursing notes.   HISTORY  Chief Complaint Back Pain    HPI DONNELLE RUBEY is a 50 y.o. female who presents to the ED from home with a chief complaint of nontraumatic right back pain times several days.  Denies fall/lifting/straining injury.  Reports aching type pain nonradiating to her abdomen.  Denies associated fever, chills, cough, chest pain, shortness of breath, nausea, vomiting, hematuria/dysuria or diarrhea.  States pain is worse when she is laying still.     Past medical history None  There are no problems to display for this patient.   No past surgical history on file.  Prior to Admission medications   Medication Sig Start Date End Date Taking? Authorizing Provider  cyclobenzaprine (FLEXERIL) 5 MG tablet 1 tablet every 8 hours as he did for muscle spasms 11/26/20  Yes Irean Hong, MD  meloxicam (MOBIC) 15 MG tablet Take 1 tablet (15 mg total) by mouth daily. 11/26/20  Yes Irean Hong, MD  medroxyPROGESTERone (PROVERA) 10 MG tablet Take 1 tablet (10 mg total) by mouth daily. 07/11/17 07/11/18  Governor Rooks, MD    Allergies Penicillins  No family history on file.  Social History Social History   Tobacco Use  . Smoking status: Current Every Day Smoker    Packs/day: 1.00    Types: Cigarettes  . Smokeless tobacco: Never Used  Substance Use Topics  . Alcohol use: No  . Drug use: No    Review of Systems  Constitutional: No fever/chills Eyes: No visual changes. ENT: No sore throat. Cardiovascular: Denies chest pain. Respiratory: Denies shortness of breath. Gastrointestinal: No abdominal pain.  No nausea, no vomiting.  No diarrhea.  No constipation. Genitourinary: Negative for  dysuria. Musculoskeletal: Positive for back pain. Skin: Negative for rash. Neurological: Negative for headaches, focal weakness or numbness.   ____________________________________________   PHYSICAL EXAM:  VITAL SIGNS: ED Triage Vitals  Enc Vitals Group     BP 11/26/20 0020 (!) 190/124     Pulse Rate 11/26/20 0020 82     Resp 11/26/20 0020 16     Temp 11/26/20 0020 97.9 F (36.6 C)     Temp Source 11/26/20 0020 Oral     SpO2 11/26/20 0020 100 %     Weight 11/26/20 0021 220 lb (99.8 kg)     Height 11/26/20 0021 5\' 7"  (1.702 m)     Head Circumference --      Peak Flow --      Pain Score 11/26/20 0020 8     Pain Loc --      Pain Edu? --      Excl. in GC? --     Constitutional: Alert and oriented. Well appearing and in mild acute distress. Eyes: Conjunctivae are normal. PERRL. EOMI. Head: Atraumatic. Nose: No congestion/rhinnorhea. Mouth/Throat: Mucous membranes are moist.   Neck: No stridor.   Cardiovascular: Normal rate, regular rhythm. Grossly normal heart sounds.  Good peripheral circulation. Respiratory: Normal respiratory effort.  No retractions. Lungs CTAB. Gastrointestinal: Soft and nontender to light or deep palpation. No distention. No abdominal bruits. No CVA tenderness. Musculoskeletal: No spinal tenderness to palpation.  Right lumbar paraspinal muscle spasm and tenderness to palpation.  Negative straight leg raise no lower extremity tenderness nor edema.  No joint effusions. Neurologic:  Normal speech and language. No gross focal neurologic deficits are appreciated. No gait instability. Skin:  Skin is warm, dry and intact. No rash noted.  No vesicles. Psychiatric: Mood and affect are normal. Speech and behavior are normal.  ____________________________________________   LABS (all labs ordered are listed, but only abnormal results are displayed)  Labs Reviewed  CBC WITH DIFFERENTIAL/PLATELET - Abnormal; Notable for the following components:      Result  Value   RDW 15.6 (*)    All other components within normal limits  COMPREHENSIVE METABOLIC PANEL - Abnormal; Notable for the following components:   Calcium 8.5 (*)    All other components within normal limits  URINALYSIS, COMPLETE (UACMP) WITH MICROSCOPIC - Abnormal; Notable for the following components:   Color, Urine YELLOW (*)    APPearance CLEAR (*)    All other components within normal limits  POC URINE PREG, ED   ____________________________________________  EKG  None ____________________________________________  RADIOLOGY I, Carleah Yablonski J, personally viewed and evaluated these images (plain radiographs) as part of my medical decision making, as well as reviewing the written report by the radiologist.  ED MD interpretation: None  Official radiology report(s): No results found.  ____________________________________________   PROCEDURES  Procedure(s) performed (including Critical Care):  Procedures   ____________________________________________   INITIAL IMPRESSION / ASSESSMENT AND PLAN / ED COURSE  As part of my medical decision making, I reviewed the following data within the electronic MEDICAL RECORD NUMBER Nursing notes reviewed and incorporated, Labs reviewed, Old chart reviewed, Notes from prior ED visits and [place] Controlled Substance Database     50 year old female presenting with right back pain. Differential diagnosis includes, but is not limited to, ovarian cyst, ovarian torsion, acute appendicitis, diverticulitis, urinary tract infection/pyelonephritis, endometriosis, bowel obstruction, colitis, renal colic, gastroenteritis, hernia, fibroids, endometriosis, pregnancy related pain including ectopic pregnancy, etc.  Will obtain basic lab work, urinalysis.  Initiate IV fluids, IV morphine for pain.  Will reassess.  Clinical Course as of 11/26/20 0511  Sat Nov 26, 2020  0221 Patient resting in no acute distress, pain improved.  Updated her of all test results.   Strict return precautions given.  Patient verbalizes understanding agrees with plan of care. [JS]    Clinical Course User Index [JS] Irean Hong, MD     ____________________________________________   FINAL CLINICAL IMPRESSION(S) / ED DIAGNOSES  Final diagnoses:  Acute right-sided low back pain without sciatica     ED Discharge Orders         Ordered    meloxicam (MOBIC) 15 MG tablet  Daily        11/26/20 0222    cyclobenzaprine (FLEXERIL) 5 MG tablet        11/26/20 0222          *Please note:  SUSSAN METER was evaluated in Emergency Department on 11/26/2020 for the symptoms described in the history of present illness. She was evaluated in the context of the global COVID-19 pandemic, which necessitated consideration that the patient might be at risk for infection with the SARS-CoV-2 virus that causes COVID-19. Institutional protocols and algorithms that pertain to the evaluation of patients at risk for COVID-19 are in a state of rapid change based on information released by regulatory bodies including the CDC and federal and state organizations. These policies and algorithms were followed during the patient's care in the ED.  Some ED evaluations and interventions may be delayed as a result of limited staffing  during and the pandemic.*   Note:  This document was prepared using Dragon voice recognition software and may include unintentional dictation errors.   Irean Hong, MD 11/26/20 [phone]

## 2020-11-26 NOTE — ED Triage Notes (Signed)
Pt complains of right sided back pain for several days without associated fever, nausea, vomiting. Pt states pain is worse when she is still, pt denies known hematuria or dysuiria.

## 2020-11-26 NOTE — Discharge Instructions (Signed)
1.  You may take medicines as needed for pain and muscle spasms (Meloxicam/Flexeril). 2.  Apply moist heat to affected area several times daily. 3.  Return to the ER for worsening symptoms, persistent vomiting, difficulty breathing, fever or other concerns.

## 2020-12-05 ENCOUNTER — Encounter: Payer: Self-pay | Admitting: Emergency Medicine

## 2020-12-05 ENCOUNTER — Other Ambulatory Visit: Payer: Self-pay

## 2020-12-05 ENCOUNTER — Emergency Department: Payer: Medicaid Other

## 2020-12-05 ENCOUNTER — Emergency Department
Admission: EM | Admit: 2020-12-05 | Discharge: 2020-12-05 | Disposition: A | Discharge status: Home / Self Care | Payer: Medicaid Other | Attending: Emergency Medicine | Admitting: Emergency Medicine

## 2020-12-05 DIAGNOSIS — M25551 Pain in right hip: Secondary | ICD-10-CM | POA: Insufficient documentation

## 2020-12-05 DIAGNOSIS — Z79899 Other long term (current) drug therapy: Secondary | ICD-10-CM | POA: Diagnosis not present

## 2020-12-05 DIAGNOSIS — X58XXXA Exposure to other specified factors, initial encounter: Secondary | ICD-10-CM | POA: Insufficient documentation

## 2020-12-05 DIAGNOSIS — F17210 Nicotine dependence, cigarettes, uncomplicated: Secondary | ICD-10-CM | POA: Insufficient documentation

## 2020-12-05 DIAGNOSIS — S3992XA Unspecified injury of lower back, initial encounter: Secondary | ICD-10-CM | POA: Diagnosis present

## 2020-12-05 DIAGNOSIS — S39012A Strain of muscle, fascia and tendon of lower back, initial encounter: Secondary | ICD-10-CM | POA: Diagnosis not present

## 2020-12-05 DIAGNOSIS — I10 Essential (primary) hypertension: Secondary | ICD-10-CM | POA: Insufficient documentation

## 2020-12-05 DIAGNOSIS — T148XXA Other injury of unspecified body region, initial encounter: Secondary | ICD-10-CM

## 2020-12-05 MED ORDER — LIDOCAINE 5 % EX PTCH: 1.0000 | MEDICATED_PATCH | Freq: Two times a day (BID) | CUTANEOUS | 0 refills | Status: AC

## 2020-12-05 MED ORDER — CLONIDINE HCL 0.1 MG PO TABS
0.2000 mg | ORAL_TABLET | Freq: Once | ORAL | Status: AC
  Administered 2020-12-05: 0.2 mg via ORAL

## 2020-12-05 MED ORDER — ORPHENADRINE CITRATE ER 100 MG PO TB12: 100.0000 mg | ORAL_TABLET | Freq: Two times a day (BID) | ORAL | 0 refills | Status: DC

## 2020-12-05 MED ORDER — ORPHENADRINE CITRATE 30 MG/ML IJ SOLN
60.0000 mg | Freq: Two times a day (BID) | INTRAMUSCULAR | Status: DC
  Administered 2020-12-05: 60 mg via INTRAMUSCULAR
  Filled 2020-12-05: qty 2

## 2020-12-05 MED ORDER — HYDROCHLOROTHIAZIDE 25 MG PO TABS: 25.0000 mg | ORAL_TABLET | Freq: Every day | ORAL | 1 refills | Status: DC

## 2020-12-05 MED ORDER — NAPROXEN 500 MG PO TABS: 500.0000 mg | ORAL_TABLET | Freq: Two times a day (BID) | ORAL | 0 refills | Status: DC

## 2020-12-05 MED ORDER — KETOROLAC TROMETHAMINE 60 MG/2ML IM SOLN
60.0000 mg | Freq: Once | INTRAMUSCULAR | Status: AC
  Administered 2020-12-05: 60 mg via INTRAMUSCULAR
  Filled 2020-12-05: qty 2

## 2020-12-05 NOTE — Discharge Instructions (Signed)
Read and follow discharge care instruction use heat instead of cold.  Advised establish care with PCP secondary to elevated blood pressure.  Your discharge readings 169/105.  Take medication as directed.

## 2020-12-05 NOTE — ED Notes (Signed)
See triage note  Presents with cont'd right lower back pain  States she was seen last week  Placed on Mobic  States pain has cont'd and changes in intensity  Ambulates slowly d/t pain  Denies nay recent injury or urinary sx's

## 2020-12-05 NOTE — ED Triage Notes (Signed)
Pt reports that she was seen last week for lower back pain. She reports that it goes her right leg. Pt walks with slow steady gait. She feels that when she is laying down and moves the left side that it pulls on her left side. Denies any GU symptoms.

## 2020-12-05 NOTE — ED Provider Notes (Signed)
[facility] Emergency Department Provider Note   ____________________________________________   Event Date/Time   First MD Initiated Contact with Patient 12/05/20 [phone]     (approximate)  I have reviewed the triage vital signs and the nursing notes.   HISTORY  Chief Complaint Back Pain    HPI Sandra Hines is a 50 y.o. female patient complaining of proximately 2 weeks of nontraumatic right lateral back pain.  Patient was seen this facility 1 week ago for same complaint.  Patient had lab work to rule out GU etiology.  Patient was given prescription of meloxicam and Flexeril.  Patient stated no no decrease in pain.  Patient describes pain as "sharp".  Patient points to the posterior right hip as the origin of the pain.  Patient pain increased when laying down.  Denies bladder or bowel dysfunction.  Rates pain as 8/10.  It was noted the patient blood pressure is 182/110.  Review of record shows on her previous visit her blood pressure also was elevated.  Patient stated no history of hypertension.  Will retake blood pressure.      History reviewed. No pertinent past medical history.  There are no problems to display for this patient.   History reviewed. No pertinent surgical history.  Prior to Admission medications   Medication Sig Start Date End Date Taking? Authorizing Provider  hydrochlorothiazide (HYDRODIURIL) 25 MG tablet Take 1 tablet (25 mg total) by mouth daily. 12/05/20  Yes Joni Reining, PA-C  lidocaine (LIDODERM) 5 % Place 1 patch onto the skin every 12 (twelve) hours. Remove & Discard patch within 12 hours or as directed by MD 12/05/20 12/05/21 Yes Joni Reining, PA-C  naproxen (NAPROSYN) 500 MG tablet Take 1 tablet (500 mg total) by mouth 2 (two) times daily with a meal. 12/05/20  Yes Joni Reining, PA-C  orphenadrine (NORFLEX) 100 MG tablet Take 1 tablet (100 mg total) by mouth 2 (two) times daily. 12/05/20  Yes Joni Reining, PA-C   cyclobenzaprine (FLEXERIL) 5 MG tablet 1 tablet every 8 hours as he did for muscle spasms 11/26/20   Irean Hong, MD  medroxyPROGESTERone (PROVERA) 10 MG tablet Take 1 tablet (10 mg total) by mouth daily. 07/11/17 07/11/18  Governor Rooks, MD  meloxicam (MOBIC) 15 MG tablet Take 1 tablet (15 mg total) by mouth daily. 11/26/20   Irean Hong, MD    Allergies Penicillins  No family history on file.  Social History Social History   Tobacco Use  . Smoking status: Current Every Day Smoker    Packs/day: 1.00    Types: Cigarettes  . Smokeless tobacco: Never Used  Substance Use Topics  . Alcohol use: No  . Drug use: No    Review of Systems  Constitutional: No fever/chills Eyes: No visual changes. ENT: No sore throat. Cardiovascular: Denies chest pain. Respiratory: Denies shortness of breath. Gastrointestinal: No abdominal pain.  No nausea, no vomiting.  No diarrhea.  No constipation. Genitourinary: Negative for dysuria. Musculoskeletal: Right lateral back and hip pain. Skin: Negative for rash. Neurological: Negative for headaches, focal weakness or numbness. Allergic/Immunilogical: Penicillin  ____________________________________________   PHYSICAL EXAM:  VITAL SIGNS: ED Triage Vitals  Enc Vitals Group     BP 12/05/20 0522 (!) 182/110     Pulse Rate 12/05/20 0522 74     Resp 12/05/20 0522 18     Temp 12/05/20 0522 98.1 F (36.7 C)     Temp Source 12/05/20 0522 Oral  SpO2 12/05/20 0522 99 %     Weight 12/05/20 0524 220 lb (99.8 kg)     Height 12/05/20 0524 5\' 7"  (1.702 m)     Head Circumference --      Peak Flow --      Pain Score 12/05/20 0521 8     Pain Loc --      Pain Edu? --      Excl. in GC? --     Constitutional: Alert and oriented. Well appearing and in no acute distress. Neck: No cervical spine tenderness to palpation. Hematological/Lymphatic/Immunilogical: No cervical lymphadenopathy. Cardiovascular: Normal rate, regular rhythm. Grossly normal heart  sounds.  Good peripheral circulation.  Elevated blood pressure 182/110. Respiratory: Normal respiratory effort.  No retractions. Lungs CTAB. Gastrointestinal: Soft and nontender. No distention. No abdominal bruits. No CVA tenderness. Genitourinary: Deferred Musculoskeletal: No obvious lumbar spine deformity.  No guarding palpation of spinal processes.  Patient has moderate guarding palpation right posterior hip.  Patient had negative straight leg test in supine position.  No lower extremity tenderness nor edema.  No joint effusions. Neurologic:  Normal speech and language. No gross focal neurologic deficits are appreciated. No gait instability. Skin:  Skin is warm, dry and intact. No rash noted. Psychiatric: Mood and affect are normal. Speech and behavior are normal.  ____________________________________________   LABS (all labs ordered are listed, but only abnormal results are displayed)  Labs Reviewed - No data to display ____________________________________________  EKG   ____________________________________________  RADIOLOGY I, 12/07/20, personally viewed and evaluated these images (plain radiographs) as part of my medical decision making, as well as reviewing the written report by the radiologist.  ED MD interpretation:    Official radiology report(s): DG Lumbar Spine 2-3 Views  Result Date: 12/05/2020 CLINICAL DATA:  Acute lower back and right hip pain without known injury. EXAM: LUMBAR SPINE - 2-3 VIEW COMPARISON:  None. FINDINGS: There is no evidence of lumbar spine fracture. Alignment is normal. Intervertebral disc spaces are maintained. IMPRESSION: Negative. Electronically Signed   By: 12/07/2020 M.D.   On: 12/05/2020 08:17   DG Hip Unilat W or Wo Pelvis 2-3 Views Right  Result Date: 12/05/2020 CLINICAL DATA:  Acute right hip pain without known injury. EXAM: DG HIP (WITH OR WITHOUT PELVIS) 2-3V RIGHT COMPARISON:  None. FINDINGS: There is no evidence of hip  fracture or dislocation. There is no evidence of arthropathy or other focal bone abnormality. IMPRESSION: Negative. Electronically Signed   By: 12/07/2020 M.D.   On: 12/05/2020 08:18    ____________________________________________   PROCEDURES  Procedure(s) performed (including Critical Care):  Procedures   ____________________________________________   INITIAL IMPRESSION / ASSESSMENT AND PLAN / ED COURSE  As part of my medical decision making, I reviewed the following data within the electronic MEDICAL RECORD NUMBER         Patient presents for 1 week right lateral back pain.  Patient state pain refractory to previous medication of meloxicam and Flexeril.  Discussed x-ray findings with patient showing no acute findings of the lumbar spine or the right hip.  Patient complaining physical exam is consistent with muscle strain.  Patient responded well to IM injection of Toradol and Norflex.  Patient given discharge care instructions and a prescription for naproxen, Norflex, and Lidoderm patches.  Patient [facility]      ____________________________________________   FINAL CLINICAL IMPRESSION(S) / ED DIAGNOSES  Final diagnoses:  Muscle strain  Hypertension,  unspecified type     ED Discharge Orders         Ordered    naproxen (NAPROSYN) 500 MG tablet  2 times daily with meals        12/05/20 0840    orphenadrine (NORFLEX) 100 MG tablet  2 times daily        12/05/20 0840    lidocaine (LIDODERM) 5 %  Every 12 hours        12/05/20 0840    hydrochlorothiazide (HYDRODIURIL) 25 MG tablet  Daily        12/05/20 0841          *Please note:  Sandra Hines was evaluated in Emergency Department on 12/05/2020 for the symptoms described in the history of present illness. She was evaluated in the context of the global COVID-19 pandemic, which necessitated consideration that the patient might be at risk for infection with the SARS-CoV-2 virus  that causes COVID-19. Institutional protocols and algorithms that pertain to the evaluation of patients at risk for COVID-19 are in a state of rapid change based on information released by regulatory bodies including the CDC and federal and state organizations. These policies and algorithms were followed during the patient's care in the ED.  Some ED evaluations and interventions may be delayed as a result of limited staffing during and the pandemic.*   Note:  This document was prepared using Dragon voice recognition software and may include unintentional dictation errors.    Joni Reining, PA-C 12/05/20 0845    Dionne Bucy, MD 12/05/20 [phone]

## 2023-02-01 IMAGING — CR DG LUMBAR SPINE 2-3V
3 series · 3 of 3 positions shown · non-contrast
Comparison: None.

CLINICAL DATA: Acute lower back and right hip pain without known
injury.

EXAM:
LUMBAR SPINE - 2-3 VIEW

[l-spine ap]
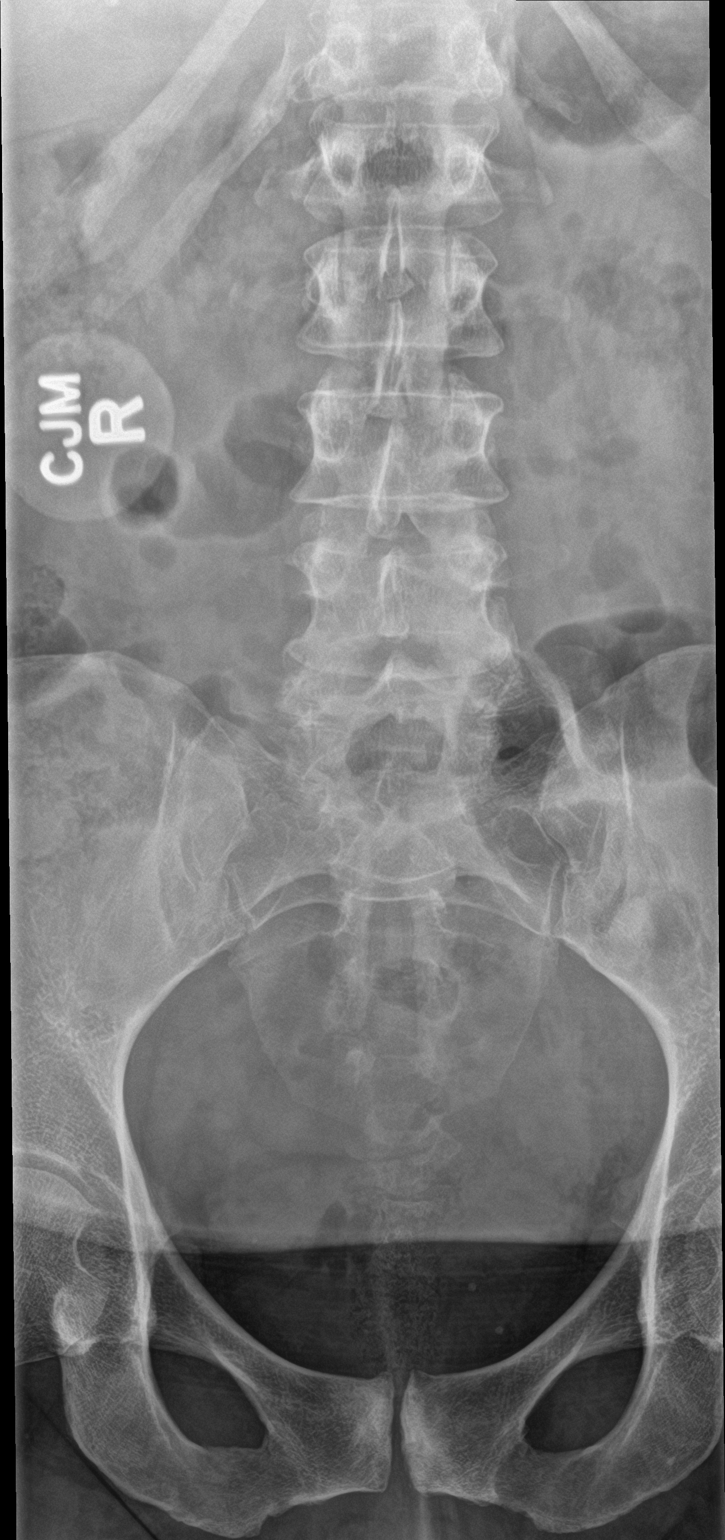

[l-spine lat]
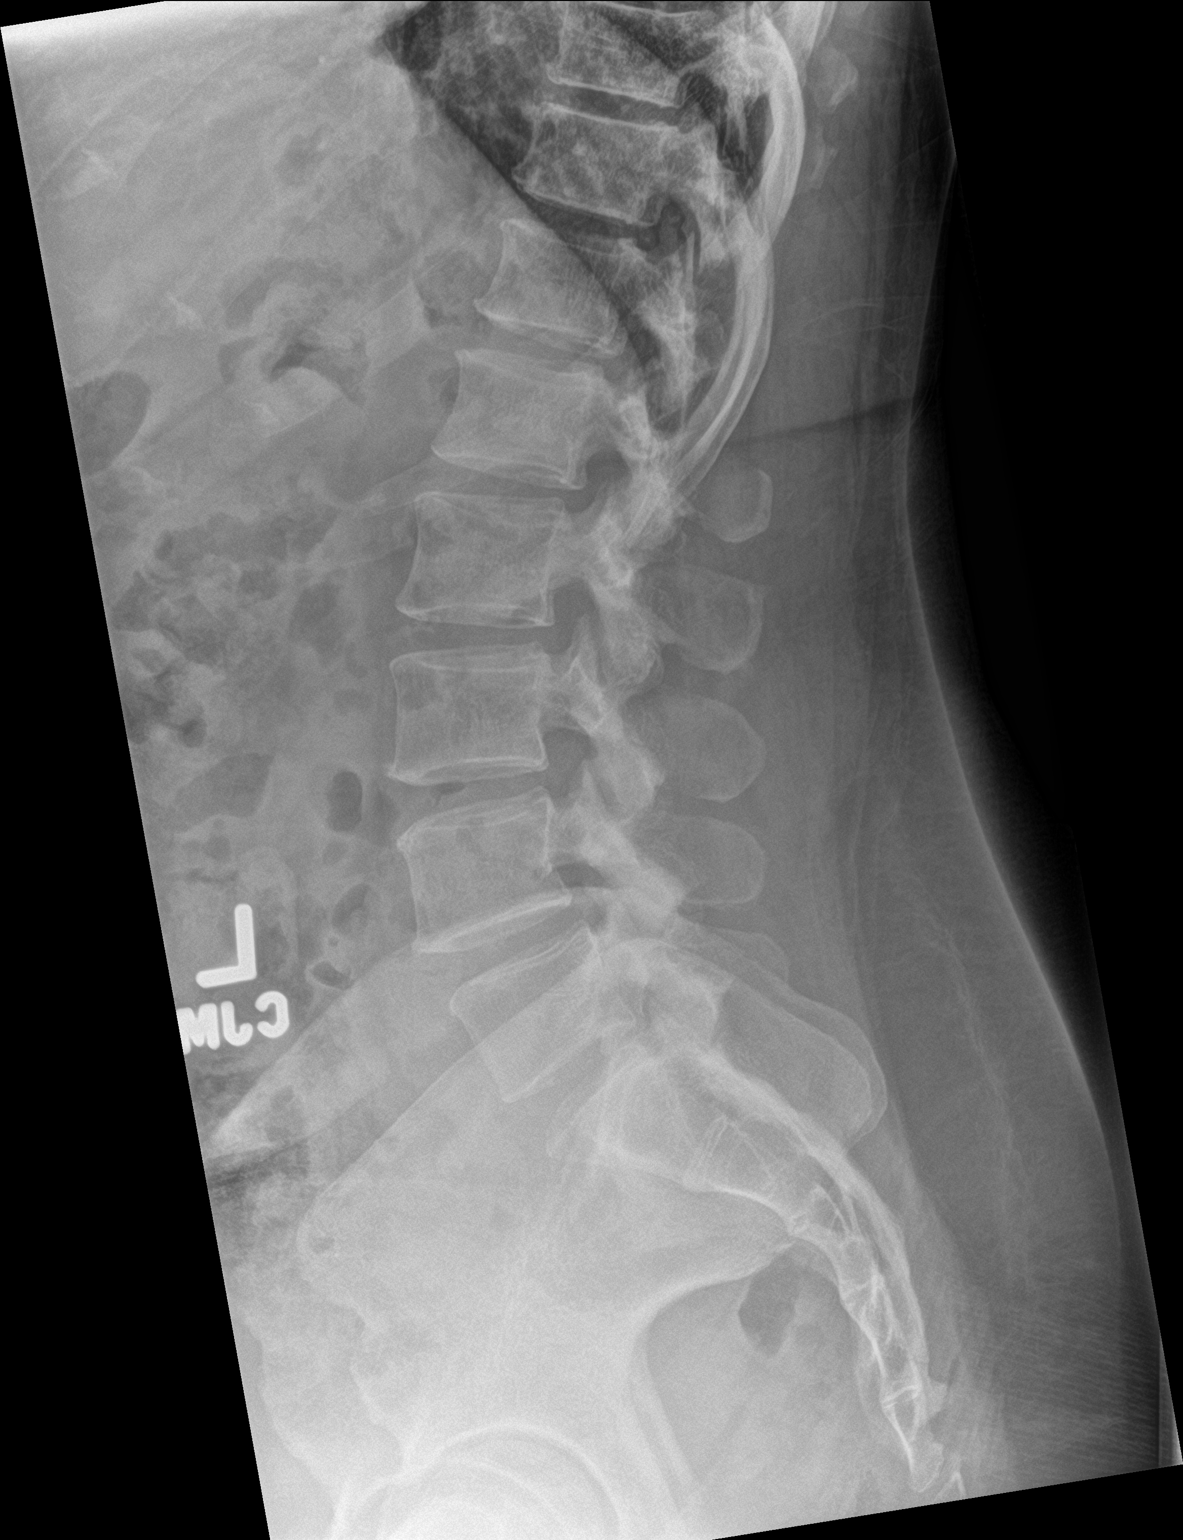

[l-spine spot]
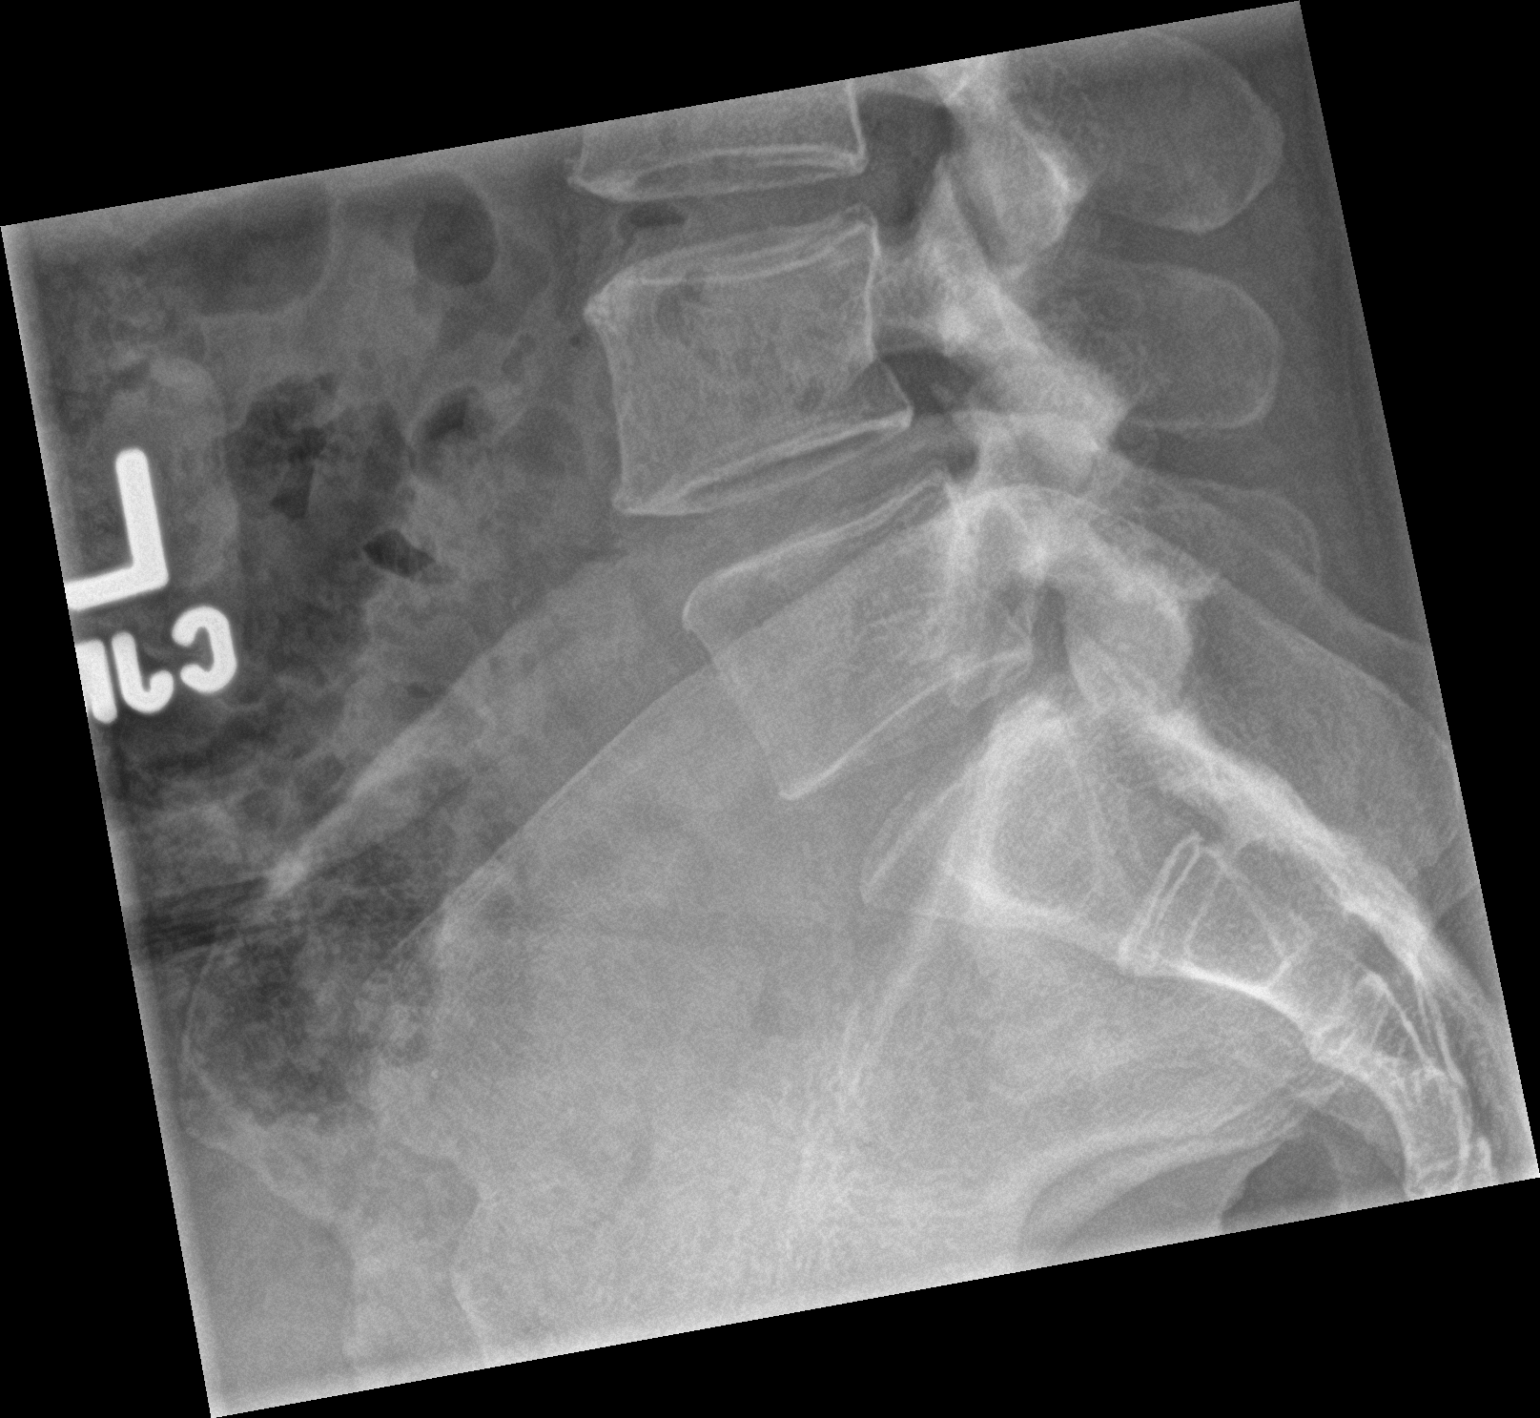

[3 of 3 positions shown; findings below may reference images not displayed]

FINDINGS: There is no evidence of lumbar spine fracture. Alignment is normal.
Intervertebral disc spaces are maintained.
IMPRESSION: Negative.

## 2023-02-15 ENCOUNTER — Ambulatory Visit: Payer: MEDICAID

## 2023-07-11 ENCOUNTER — Ambulatory Visit: Payer: MEDICAID | Admitting: Physician Assistant

## 2023-07-11 NOTE — Progress Notes (Deleted)
New patient visit  Patient: Sandra Hines   DOB: [birth year]   52 y.o. Female  MRN: [id number] Visit Date: 07/11/2023  Today's healthcare provider: Debera Lat, PA-C   No chief complaint on file.  Subjective    Sandra Hines is a 52 y.o. female who presents today as a new patient to establish care.  HPI  *** Discussed the use of AI scribe software for clinical note transcription with the patient, who gave verbal consent to proceed.  History of Present Illness            No past medical history on file. No past surgical history on file. Family Status  Relation Name Status   Mother  Alive   Father  Alive  No partnership data on file   No family history on file. Social History   Socioeconomic History   Marital status: Single    Spouse name: Not on file   Number of children: Not on file   Years of education: Not on file   Highest education level: Not on file  Occupational History   Not on file  Tobacco Use   Smoking status: Every Day    Current packs/day: 1.00    Types: Cigarettes   Smokeless tobacco: Never  Substance and Sexual Activity   Alcohol use: No   Drug use: No   Sexual activity: Yes    Birth control/protection: None, Condom  Other Topics Concern   Not on file  Social History Narrative   Not on file   Social Drivers of Health   Financial Resource Strain: Not on file  Food Insecurity: Not on file  Transportation Needs: Not on file  Physical Activity: Not on file  Stress: Not on file  Social Connections: Not on file   Outpatient Medications Prior to Visit  Medication Sig   cyclobenzaprine (FLEXERIL) 5 MG tablet 1 tablet every 8 hours as he did for muscle spasms   hydrochlorothiazide (HYDRODIURIL) 25 MG tablet Take 1 tablet (25 mg total) by mouth daily.   medroxyPROGESTERone (PROVERA) 10 MG tablet Take 1 tablet (10 mg total) by mouth daily.   meloxicam (MOBIC) 15 MG tablet Take 1 tablet (15 mg total) by mouth daily.   naproxen (NAPROSYN) 500 MG  tablet Take 1 tablet (500 mg total) by mouth 2 (two) times daily with a meal.   orphenadrine (NORFLEX) 100 MG tablet Take 1 tablet (100 mg total) by mouth 2 (two) times daily.   No facility-administered medications prior to visit.   Allergies  Allergen Reactions   Penicillins Hives    Has patient had a PCN reaction causing immediate rash, facial/tongue/throat swelling, SOB or lightheadedness with hypotension: No Has patient had a PCN reaction causing severe rash involving mucus membranes or skin necrosis: No Has patient had a PCN reaction that required hospitalization: No Has patient had a PCN reaction occurring within the last 10 years: No If all of the above answers are "NO", then may proceed with Cephalosporin use.      There is no immunization history on file for this patient.  Health Maintenance  Topic Date Due   HIV Screening  Never done   Hepatitis C Screening  Never done   DTaP/Tdap/Td (1 - Tdap) Never done   Cervical Cancer Screening (HPV/Pap Cotest)  Never done   Colonoscopy  Never done   INFLUENZA VACCINE  Never done   COVID-19 Vaccine (1 - 2024-25 season) Never done   Lung Cancer Screening  05/12/2023  MAMMOGRAM  Never done   Zoster Vaccines- Shingrix (1 of 2) Never done   HPV VACCINES  Aged Out    Patient Care Team: Patient, No Pcp Per as PCP - General (General Practice)  Review of Systems  All other systems reviewed and are negative.  Except see HPI   {Insert previous labs (optional):23779} {See past labs  Heme  Chem  Endocrine  Serology  Results Review (optional):1}   Objective    There were no vitals taken for this visit. {Insert last BP/Wt (optional):23777}{See vitals history (optional):1}   Physical Exam Vitals reviewed.  Constitutional:      General: She is not in acute distress.    Appearance: Normal appearance. She is well-developed. She is not diaphoretic.  HENT:     Head: Normocephalic and atraumatic.  Eyes:     General: No  scleral icterus.    Conjunctiva/sclera: Conjunctivae normal.  Neck:     Thyroid: No thyromegaly.  Cardiovascular:     Rate and Rhythm: Normal rate and regular rhythm.     Pulses: Normal pulses.     Heart sounds: Normal heart sounds. No murmur heard. Pulmonary:     Effort: Pulmonary effort is normal. No respiratory distress.     Breath sounds: Normal breath sounds. No wheezing, rhonchi or rales.  Musculoskeletal:     Cervical back: Neck supple.     Right lower leg: No edema.     Left lower leg: No edema.  Lymphadenopathy:     Cervical: No cervical adenopathy.  Skin:    General: Skin is warm and dry.     Findings: No rash.  Neurological:     Mental Status: She is alert and oriented to person, place, and time. Mental status is at baseline.  Psychiatric:        Mood and Affect: Mood normal.        Behavior: Behavior normal.     Depression Screen     No data to display         No results found for any visits on 07/11/23.  Assessment & Plan     *** Assessment and Plan     Encounter to establish care Welcomed to our clinic Reviewed past medical hx, social hx, family hx and surgical hx Pt advised to send all vaccination records or screening   No follow-ups on file.    The patient was advised to call back or seek an in-person evaluation if the symptoms worsen or if the condition fails to improve as anticipated.  I discussed the assessment and treatment plan with the patient. The patient was provided an opportunity to ask questions and all were answered. The patient agreed with the plan and demonstrated an understanding of the instructions.  I, Debera Lat, PA-C have reviewed all documentation for this visit. The documentation on  07/11/2023   for the exam, diagnosis, procedures, and orders are all accurate and complete.  Debera Hines, [facility], MMS [facility] [phone] (phone) [phone] (fax)  [facility]

## 2023-07-19 ENCOUNTER — Encounter: Payer: Self-pay | Admitting: Family Medicine

## 2023-07-19 ENCOUNTER — Ambulatory Visit (INDEPENDENT_AMBULATORY_CARE_PROVIDER_SITE_OTHER): Payer: MEDICAID | Admitting: Family Medicine

## 2023-07-19 VITALS — BP 133/114 | HR 130 | Ht 67.0 in | Wt 208.4 lb

## 2023-07-19 DIAGNOSIS — Z122 Encounter for screening for malignant neoplasm of respiratory organs: Secondary | ICD-10-CM | POA: Insufficient documentation

## 2023-07-19 DIAGNOSIS — Z1231 Encounter for screening mammogram for malignant neoplasm of breast: Secondary | ICD-10-CM | POA: Insufficient documentation

## 2023-07-19 DIAGNOSIS — Z6832 Body mass index (BMI) 32.0-32.9, adult: Secondary | ICD-10-CM

## 2023-07-19 DIAGNOSIS — Z0001 Encounter for general adult medical examination with abnormal findings: Secondary | ICD-10-CM | POA: Diagnosis not present

## 2023-07-19 DIAGNOSIS — Z1159 Encounter for screening for other viral diseases: Secondary | ICD-10-CM | POA: Insufficient documentation

## 2023-07-19 DIAGNOSIS — Z0000 Encounter for general adult medical examination without abnormal findings: Secondary | ICD-10-CM

## 2023-07-19 DIAGNOSIS — Z114 Encounter for screening for human immunodeficiency virus [HIV]: Secondary | ICD-10-CM | POA: Insufficient documentation

## 2023-07-19 DIAGNOSIS — M1990 Unspecified osteoarthritis, unspecified site: Secondary | ICD-10-CM

## 2023-07-19 DIAGNOSIS — I10 Essential (primary) hypertension: Secondary | ICD-10-CM | POA: Insufficient documentation

## 2023-07-19 DIAGNOSIS — F17200 Nicotine dependence, unspecified, uncomplicated: Secondary | ICD-10-CM | POA: Diagnosis not present

## 2023-07-19 DIAGNOSIS — Z8679 Personal history of other diseases of the circulatory system: Secondary | ICD-10-CM | POA: Insufficient documentation

## 2023-07-19 DIAGNOSIS — F39 Unspecified mood [affective] disorder: Secondary | ICD-10-CM

## 2023-07-19 DIAGNOSIS — Z7689 Persons encountering health services in other specified circumstances: Secondary | ICD-10-CM | POA: Insufficient documentation

## 2023-07-19 DIAGNOSIS — Z1211 Encounter for screening for malignant neoplasm of colon: Secondary | ICD-10-CM | POA: Insufficient documentation

## 2023-07-19 DIAGNOSIS — Z1331 Encounter for screening for depression: Secondary | ICD-10-CM | POA: Insufficient documentation

## 2023-07-19 DIAGNOSIS — N951 Menopausal and female climacteric states: Secondary | ICD-10-CM

## 2023-07-19 MED ORDER — LOSARTAN POTASSIUM-HCTZ 100-12.5 MG PO TABS: 1.0000 | ORAL_TABLET | Freq: Every day | ORAL | 3 refills | Status: DC

## 2023-07-19 MED ORDER — TRAZODONE HCL 50 MG PO TABS: 25.0000 mg | ORAL_TABLET | Freq: Every evening | ORAL | 0 refills | Status: DC | PRN

## 2023-07-19 MED ORDER — BUPROPION HCL ER (SR) 150 MG PO TB12: ORAL_TABLET | ORAL | 3 refills | Status: DC

## 2023-07-19 MED ORDER — ROSUVASTATIN CALCIUM 40 MG PO TABS: 40.0000 mg | ORAL_TABLET | Freq: Every day | ORAL | 3 refills | Status: DC

## 2023-07-19 MED ORDER — BUSPIRONE HCL 5 MG PO TABS: 5.0000 mg | ORAL_TABLET | Freq: Three times a day (TID) | ORAL | 2 refills | Status: DC

## 2023-07-19 MED ORDER — DULOXETINE HCL 20 MG PO CPEP: ORAL_CAPSULE | ORAL | 0 refills | Status: DC

## 2023-07-19 NOTE — Patient Instructions (Signed)
 Please call and schedule your mammogram:  [facility]  [address], [address] [facility] [place],  [place]  [address] Main: [phone]  Sunday:Closed Monday:7:20 AM - 5:00 PM Tuesday:7:20 AM - 5:00 PM Wednesday:7:20 AM - 5:00 PM Thursday:7:20 AM - 5:00 PM Friday:7:20 AM - 4:30 PM Saturday:Closed

## 2023-07-19 NOTE — Progress Notes (Unsigned)
New patient visit   Patient: Sandra Hines   DOB: November 06, 1970   52 y.o. Female  MRN: [id number] Visit Date: 07/19/2023  Today's healthcare provider: Jacky Kindle, FNP  Patient presents for new patient visit to establish care.  Introduced to Publishing rights manager role and practice setting.  All questions answered.  Discussed provider/patient relationship and expectations.  Chief Complaint  Patient presents with   New Patient (Initial Visit)    Patient reports her chest beats really fast and left knee is really hard to move, not sleeping good due to feeling like a crook in her back, unsure about being menopausal because she did not have menstrual for 4 months and then it came back and then stopped again as well as she has noticed a nig cyst in that area. Patient would also like a referral for an eye doctor.    Immunizations    Shingles - not sure  Influenza - not sure Tetanus - yes    Subjective    Sandra Hines is a 52 y.o. female who presents today as a new patient to establish care.  HPI HPI     New Patient (Initial Visit)    Additional comments: Patient reports her chest beats really fast and left knee is really hard to move, not sleeping good due to feeling like a crook in her back, unsure about being menopausal because she did not have menstrual for 4 months and then it came back and then stopped again as well as she has noticed a nig cyst in that area. Patient would also like a referral for an eye doctor.         Immunizations    Additional comments: Shingles - not sure  Influenza - not sure Tetanus - yes         Comments   -Mammogram: ok to order  -Colonoscopy/Cologuard: ok to order cologuard  -Cervical Cancer Screening: maybe 2 years (health department) -Lung Screening: not sure -HIV Screening: not sure -Hep C Screening: not sure      Last edited by Acey Lav, CMA on 07/19/2023  1:26 PM.      Past Medical History:  Diagnosis Date   Hypertension     No past surgical history on file. Family Status  Relation Name Status   Mother  Alive   Father  Alive  No partnership data on file   No family history on file. Social History   Socioeconomic History   Marital status: Single    Spouse name: Not on file   Number of children: Not on file   Years of education: Not on file   Highest education level: Not on file  Occupational History   Not on file  Tobacco Use   Smoking status: Every Day    Current packs/day: 1.00    Types: Cigarettes   Smokeless tobacco: Never  Substance and Sexual Activity   Alcohol use: No   Drug use: No   Sexual activity: Yes    Birth control/protection: None, Condom  Other Topics Concern   Not on file  Social History Narrative   Not on file   Social Drivers of Health   Financial Resource Strain: Not on file  Food Insecurity: Not on file  Transportation Needs: Not on file  Physical Activity: Not on file  Stress: Not on file  Social Connections: Not on file   Outpatient Medications Prior to Visit  Medication Sig   [DISCONTINUED] cyclobenzaprine (FLEXERIL) 5 MG  tablet 1 tablet every 8 hours as he did for muscle spasms (Patient not taking: Reported on 07/19/2023)   [DISCONTINUED] hydrochlorothiazide (HYDRODIURIL) 25 MG tablet Take 1 tablet (25 mg total) by mouth daily. (Patient not taking: Reported on 07/19/2023)   [DISCONTINUED] medroxyPROGESTERone (PROVERA) 10 MG tablet Take 1 tablet (10 mg total) by mouth daily.   [DISCONTINUED] meloxicam (MOBIC) 15 MG tablet Take 1 tablet (15 mg total) by mouth daily. (Patient not taking: Reported on 07/19/2023)   [DISCONTINUED] naproxen (NAPROSYN) 500 MG tablet Take 1 tablet (500 mg total) by mouth 2 (two) times daily with a meal. (Patient not taking: Reported on 07/19/2023)   [DISCONTINUED] orphenadrine (NORFLEX) 100 MG tablet Take 1 tablet (100 mg total) by mouth 2 (two) times daily. (Patient not taking: Reported on 07/19/2023)   No facility-administered  medications prior to visit.   Allergies  Allergen Reactions   Penicillins Hives    Has patient had a PCN reaction causing immediate rash, facial/tongue/throat swelling, SOB or lightheadedness with hypotension: No Has patient had a PCN reaction causing severe rash involving mucus membranes or skin necrosis: No Has patient had a PCN reaction that required hospitalization: No Has patient had a PCN reaction occurring within the last 10 years: No If all of the above answers are "NO", then may proceed with Cephalosporin use.     Immunization History  Administered Date(s) Administered   Td 01/27/2003   Tdap 01/30/2012    Health Maintenance  Topic Date Due   Pneumococcal Vaccine 37-26 Years old (1 of 2 - PCV) Never done   Cervical Cancer Screening (HPV/Pap Cotest)  Never done   Colonoscopy  Never done   DTaP/Tdap/Td (3 - Td or Tdap) 01/29/2022   INFLUENZA VACCINE  Never done   COVID-19 Vaccine (1 - 2024-25 season) Never done   Lung Cancer Screening  05/12/2023   MAMMOGRAM  Never done   Zoster Vaccines- Shingrix (1 of 2) Never done   Hepatitis C Screening  Completed   HIV Screening  Completed   HPV VACCINES  Aged Out    Patient Care Team: Ronnald Ramp, MD as PCP - General (Family Medicine)  Review of Systems  Last CBC Lab Results  Component Value Date   WBC 8.0 07/19/2023   HGB 13.3 07/19/2023   HCT 42.1 07/19/2023   MCV 88 07/19/2023   MCH 27.9 07/19/2023   RDW 12.9 07/19/2023   PLT 184 07/19/2023   Last metabolic panel Lab Results  Component Value Date   GLUCOSE 178 (H) 07/19/2023   NA 139 07/19/2023   K 4.3 07/19/2023   CL 102 07/19/2023   CO2 24 07/19/2023   BUN 16 07/19/2023   CREATININE 1.04 (H) 07/19/2023   EGFR 65 07/19/2023   CALCIUM 9.2 07/19/2023   PROT 6.7 07/19/2023   ALBUMIN 4.0 07/19/2023   LABGLOB 2.7 07/19/2023   BILITOT 0.3 07/19/2023   ALKPHOS 86 07/19/2023   AST 13 07/19/2023   ALT 17 07/19/2023   ANIONGAP 6 11/26/2020    Last lipids Lab Results  Component Value Date   CHOL 187 07/19/2023   HDL 51 07/19/2023   LDLCALC 117 (H) 07/19/2023   TRIG 108 07/19/2023   CHOLHDL 3.7 07/19/2023   Last hemoglobin A1c Lab Results  Component Value Date   HGBA1C 5.8 (H) 07/19/2023   Last thyroid functions Lab Results  Component Value Date   TSH 0.406 (L) 07/19/2023   Last vitamin D Lab Results  Component Value Date   VD25OH  10.8 (L) 07/19/2023   Last vitamin B12 and Folate Lab Results  Component Value Date   VITAMINB12 287 07/19/2023    Objective    BP (!) 133/114 (BP Location: Right Arm, Patient Position: Sitting, Cuff Size: Large)   Pulse (!) 130   Ht 5\' 7"  (1.702 m)   Wt 208 lb 6.4 oz (94.5 kg)   LMP  (LMP Unknown)   SpO2 100%   BMI 32.64 kg/m  BP Readings from Last 3 Encounters:  07/19/23 (!) 133/114  12/05/20 (!) 174/102  11/26/20 (!) 160/94   Wt Readings from Last 3 Encounters:  07/19/23 208 lb 6.4 oz (94.5 kg)  12/05/20 220 lb (99.8 kg)  11/26/20 220 lb (99.8 kg)   SpO2 Readings from Last 3 Encounters:  07/19/23 100%  12/05/20 99%  11/26/20 100%   Physical Exam Vitals and nursing note reviewed.  Constitutional:      General: She is awake. She is not in acute distress.    Appearance: Normal appearance. She is well-developed and well-groomed. She is obese. She is not ill-appearing, toxic-appearing or diaphoretic.  HENT:     Head: Normocephalic and atraumatic.     Jaw: There is normal jaw occlusion. No trismus, tenderness, swelling or pain on movement.     Right Ear: Hearing, tympanic membrane, ear canal and external ear normal. There is no impacted cerumen.     Left Ear: Hearing, tympanic membrane, ear canal and external ear normal. There is no impacted cerumen.     Nose: Nose normal. No congestion or rhinorrhea.     Right Turbinates: Not enlarged, swollen or pale.     Left Turbinates: Not enlarged, swollen or pale.     Right Sinus: No maxillary sinus tenderness or frontal  sinus tenderness.     Left Sinus: No maxillary sinus tenderness or frontal sinus tenderness.     Mouth/Throat:     Lips: Pink.     Mouth: Mucous membranes are moist. No injury.     Tongue: No lesions.     Pharynx: Oropharynx is clear. Uvula midline. No pharyngeal swelling, oropharyngeal exudate, posterior oropharyngeal erythema or uvula swelling.     Tonsils: No tonsillar exudate or tonsillar abscesses.  Eyes:     General: Lids are normal. Lids are everted, no foreign bodies appreciated. Vision grossly intact. Gaze aligned appropriately. No allergic shiner or visual field deficit.       Right eye: No discharge.        Left eye: No discharge.     Extraocular Movements: Extraocular movements intact.     Conjunctiva/sclera: Conjunctivae normal.     Right eye: Right conjunctiva is not injected. No exudate.    Left eye: Left conjunctiva is not injected. No exudate.    Pupils: Pupils are equal, round, and reactive to light.  Neck:     Thyroid: No thyroid mass, thyromegaly or thyroid tenderness.     Vascular: No carotid bruit.     Trachea: Trachea normal.  Cardiovascular:     Rate and Rhythm: Normal rate and regular rhythm.     Pulses: Normal pulses.          Carotid pulses are 2+ on the right side and 2+ on the left side.      Radial pulses are 2+ on the right side and 2+ on the left side.       Dorsalis pedis pulses are 2+ on the right side and 2+ on the left side.  Posterior tibial pulses are 2+ on the right side and 2+ on the left side.     Heart sounds: Normal heart sounds, S1 normal and S2 normal. No murmur heard.    No friction rub. No gallop.  Pulmonary:     Effort: Pulmonary effort is normal. No respiratory distress.     Breath sounds: Normal breath sounds and air entry. No stridor. No wheezing, rhonchi or rales.  Chest:     Chest wall: No tenderness.  Abdominal:     General: Abdomen is flat. Bowel sounds are normal. There is no distension.     Palpations: Abdomen is soft.  There is no mass.     Tenderness: There is no abdominal tenderness. There is no right CVA tenderness, left CVA tenderness, guarding or rebound.     Hernia: No hernia is present.  Genitourinary:    Comments: Exam deferred; denies complaints Musculoskeletal:        General: Tenderness present. No swelling, deformity or signs of injury. Normal range of motion.     Cervical back: Full passive range of motion without pain, normal range of motion and neck supple. No edema, rigidity or tenderness. No muscular tenderness.     Right lower leg: No edema.     Left lower leg: No edema.     Comments: Reports hx of bilateral knee pain- Left knee pain present at time of OV. No edema, erythema or gross malrotation. Upper neck pain noted; discussed stretching options to assist. Avoid NSAIDS given BP; encouraged to stop Goody's powder and use consistent [phone] mg TID PRN of tylenol.  Lymphadenopathy:     Cervical: No cervical adenopathy.     Right cervical: No superficial, deep or posterior cervical adenopathy.    Left cervical: No superficial, deep or posterior cervical adenopathy.  Skin:    General: Skin is warm and dry.     Capillary Refill: Capillary refill takes less than 2 seconds.     Coloration: Skin is not jaundiced or pale.     Findings: No bruising, erythema, lesion or rash.  Neurological:     General: No focal deficit present.     Mental Status: She is alert and oriented to person, place, and time. Mental status is at baseline.     GCS: GCS eye subscore is 4. GCS verbal subscore is 5. GCS motor subscore is 6.     Sensory: Sensation is intact. No sensory deficit.     Motor: Motor function is intact. No weakness.     Coordination: Coordination is intact. Coordination normal.     Gait: Gait is intact. Gait normal.  Psychiatric:        Attention and Perception: Attention and perception normal.        Mood and Affect: Mood and affect normal.        Speech: Speech normal.        Behavior:  Behavior normal. Behavior is cooperative.        Thought Content: Thought content normal.        Cognition and Memory: Cognition and memory normal.        Judgment: Judgment normal.    Depression Screen    07/19/2023    1:21 PM  PHQ 2/9 Scores  PHQ - 2 Score 4  PHQ- 9 Score 18   Results for orders placed or performed in visit on 07/19/23  CBC with Differential/Platelet  Result Value Ref Range   WBC 8.0 3.4 - 10.8 x10E3/uL  RBC 4.77 3.77 - 5.28 x10E6/uL   Hemoglobin 13.3 11.1 - 15.9 g/dL   Hematocrit 44.0 34.7 - 46.6 %   MCV 88 79 - 97 fL   MCH 27.9 26.6 - 33.0 pg   MCHC 31.6 31.5 - 35.7 g/dL   RDW 42.5 95.6 - 38.7 %   Platelets 184 150 - 450 x10E3/uL   Neutrophils 64 Not Estab. %   Lymphs 27 Not Estab. %   Monocytes 7 Not Estab. %   Eos 1 Not Estab. %   Basos 1 Not Estab. %   Neutrophils Absolute 5.1 1.4 - 7.0 x10E3/uL   Lymphocytes Absolute 2.2 0.7 - 3.1 x10E3/uL   Monocytes Absolute 0.6 0.1 - 0.9 x10E3/uL   EOS (ABSOLUTE) 0.1 0.0 - 0.4 x10E3/uL   Basophils Absolute 0.1 0.0 - 0.2 x10E3/uL   Immature Granulocytes 0 Not Estab. %   Immature Grans (Abs) 0.0 0.0 - 0.1 x10E3/uL  Comprehensive metabolic panel  Result Value Ref Range   Glucose 178 (H) 70 - 99 mg/dL   BUN 16 6 - 24 mg/dL   Creatinine, Ser 5.64 (H) 0.57 - 1.00 mg/dL   eGFR 65 >33 IR/JJO/8.41   BUN/Creatinine Ratio 15 9 - 23   Sodium 139 134 - 144 mmol/L   Potassium 4.3 3.5 - 5.2 mmol/L   Chloride 102 96 - 106 mmol/L   CO2 24 20 - 29 mmol/L   Calcium 9.2 8.7 - 10.2 mg/dL   Total Protein 6.7 6.0 - 8.5 g/dL   Albumin 4.0 3.9 - 4.9 g/dL   Globulin, Total 2.7 1.5 - 4.5 g/dL   Bilirubin Total 0.3 0.0 - 1.2 mg/dL   Alkaline Phosphatase 86 44 - 121 IU/L   AST 13 0 - 40 IU/L   ALT 17 0 - 32 IU/L  Lipid panel  Result Value Ref Range   Cholesterol, Total 187 100 - 199 mg/dL   Triglycerides 660 0 - 149 mg/dL   HDL 51 >63 mg/dL   VLDL Cholesterol Cal 19 5 - 40 mg/dL   LDL Chol Calc (NIH) 016 (H) 0 - 99 mg/dL    Chol/HDL Ratio 3.7 0.0 - 4.4 ratio  TSH  Result Value Ref Range   TSH 0.406 (L) 0.450 - 4.500 uIU/mL  Hepatitis C antibody  Result Value Ref Range   Hep C Virus Ab Non Reactive Non Reactive  Hemoglobin A1c  Result Value Ref Range   Hgb A1c MFr Bld 5.8 (H) 4.8 - 5.6 %   Est. average glucose Bld gHb Est-mCnc 120 mg/dL  VITAMIN D 25 Hydroxy (Vit-D Deficiency, Fractures)  Result Value Ref Range   Vit D, 25-Hydroxy 10.8 (L) 30.0 - 100.0 ng/mL  Vitamin B12  Result Value Ref Range   Vitamin B-12 287 232 - 1,245 pg/mL  HIV Antibody (routine testing w rflx)  Result Value Ref Range   HIV Screen 4th Generation wRfx Non Reactive Non Reactive  FSH/LH  Result Value Ref Range   LH 40.7 mIU/mL   FSH 32.0 mIU/mL  Estrogens, total  Result Value Ref Range   Estrogen WILL FOLLOW   Estradiol  Result Value Ref Range   Estradiol 52.5 pg/mL  Testosterone,Free and Total  Result Value Ref Range   Testosterone 55 (H) 4 - 50 ng/dL   Testosterone, Free 3.4 0.0 - 4.2 pg/mL    Assessment & Plan      Problem List Items Addressed This Visit       Cardiovascular and Mediastinum  Primary hypertension   Chronic, uncontrolled Goal remains <119/79 Recommend start of hyzaar 100-12.5 with close f/u      Relevant Medications   losartan-hydrochlorothiazide (HYZAAR) 100-12.5 MG tablet   rosuvastatin (CRESTOR) 40 MG tablet   Other Relevant Orders   CBC with Differential/Platelet (Completed)   Comprehensive metabolic panel (Completed)   TSH (Completed)     Nervous and Auditory   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 Emphasis of chronic HTN control and smoking cessation to assist with future concerns      Relevant Medications   losartan-hydrochlorothiazide (HYZAAR) 100-12.5 MG tablet   rosuvastatin (CRESTOR) 40 MG tablet     Musculoskeletal and Integument   Arthritis   Reports hx of bilateral  knee pain- Left knee pain present at time of OV. No edema, erythema or gross malrotation. Upper neck pain noted; discussed stretching options to assist. Avoid NSAIDS given BP; encouraged to stop Goody's powder and use consistent [phone] mg TID PRN of tylenol.        Other   Annual physical exam - Primary   Things to do to keep yourself healthy  - Exercise at least 30-45 minutes a day, 3-4 days a week.  - Eat a low-fat diet with lots of fruits and vegetables, up to 7-9 servings per day.  - Seatbelts can save your life. Wear them always.  - Smoke detectors on every level of your home, check batteries every year.  - Eye Doctor - have an eye exam every 1-2 years  - Safe sex - if you may be exposed to STDs, use a condom.  - Alcohol -  If you drink, do it moderately, less than 2 drinks per day.  - Health Care Power of Attorney. Choose someone to speak for you if you are not able.  - Depression is common in our stressful world.If you're feeling down or losing interest in things you normally enjoy, please come in for a visit.  - Violence - If anyone is threatening or hurting you, please call immediately.       Relevant Orders   CBC with Differential/Platelet (Completed)   Comprehensive metabolic panel (Completed)   Lipid panel (Completed)   TSH (Completed)   BMI 32.0-32.9,adult   Body mass index is 32.64 kg/m. Discussed importance of healthy weight management Discussed diet and exercise       Relevant Orders   Lipid panel (Completed)   Hemoglobin A1c (Completed)   VITAMIN D 25 Hydroxy (Vit-D Deficiency, Fractures) (Completed)   Vitamin B12 (Completed)   Encounter for hepatitis C screening test for low risk patient   Low risk screen Treatable, and curable. If left untreated Hep C can lead to cirrhosis and liver failure. Encourage routine testing; recommend repeat testing if risk factors change.       Relevant Orders   Hepatitis C antibody (Completed)   MM 3D SCREENING MAMMOGRAM  BILATERAL BREAST   Encounter for screening for HIV   Low risk screen Consented; encouraged to "know your status" Recommend repeat screen if risk factors change       Relevant Orders   HIV Antibody (routine testing w rflx) (Completed)   Establishing care with new doctor, encounter for   Moderate tobacco dependence   Chronic, recommend start of wellbutrin to assist F/u as needed- complete cessation recommend for health benefits      Relevant Medications   losartan-hydrochlorothiazide (HYZAAR) 100-12.5 MG tablet  buPROPion (WELLBUTRIN SR) 150 MG 12 hr tablet   Other Relevant Orders   Ambulatory Referral Lung Cancer Screening [facility]   Mood disorder (HCC)   Acute on chronic, untreated Recommend start of cymbalta as well as buspar PRN and trazodone to assist sleep contracted for safety; pt denies SI or HI Will use wellbutrin to assist with weight mgmt and smoking cessation efforts      Relevant Medications   busPIRone (BUSPAR) 5 MG tablet   traZODone (DESYREL) 50 MG tablet   Perimenopausal symptom   Request for hormone check; has gone 4-5 months between cycle Advised that true menopause is defined as 366 days without vaginal bleed; CTM      Relevant Orders   FSH/LH (Completed)   Estrogens, total (Completed)   Estradiol (Completed)   Testosterone,Free and Total (Completed)   Positive screening for depression on 9-item Patient Health Questionnaire (PHQ-9)   Patient believes elevation is d/t chronic pain as well as acute ailments like chest fluttering d/t uncontrolled HTN and awareness of HR. Recommend start of cymblata to assist with PRN medication to further assist. Buspar to help with likely anxiety at TID PRN dosing. Recommend 4-6 week follow up with new PCP      Relevant Medications   buPROPion (WELLBUTRIN SR) 150 MG 12 hr tablet   DULoxetine (CYMBALTA) 20 MG capsule   Screen for colon cancer   Due for screening for colon cancer; agreeable to cologuard at  this time      Relevant Orders   Cologuard   Screening for lung cancer   Due for lung cancer screening; reports 1+PPD      Relevant Orders   Ambulatory Referral Lung Cancer Screening [facility]   Screening mammogram for breast cancer   Due for screening for mammogram, denies breast concerns, provided with phone number to call and schedule appointment for mammogram. Encouraged to repeat breast cancer screening every 1-2 years.       Relevant Orders   MM 3D SCREENING MAMMOGRAM BILATERAL BREAST   Return in about 6 weeks (around 08/30/2023) for anxiety and depression, HTN management.    Leilani Merl, FNP, have reviewed all documentation for this visit. The documentation on 07/20/23 for the exam, diagnosis, procedures, and orders are all accurate and complete.  Jacky Kindle, FNP  [facility] [phone] (phone) [phone] (fax)  [facility]

## 2023-07-20 ENCOUNTER — Other Ambulatory Visit: Payer: Self-pay | Admitting: Family Medicine

## 2023-07-20 DIAGNOSIS — F39 Unspecified mood [affective] disorder: Secondary | ICD-10-CM | POA: Insufficient documentation

## 2023-07-20 DIAGNOSIS — I10 Essential (primary) hypertension: Secondary | ICD-10-CM

## 2023-07-20 DIAGNOSIS — F321 Major depressive disorder, single episode, moderate: Secondary | ICD-10-CM | POA: Insufficient documentation

## 2023-07-20 DIAGNOSIS — M1990 Unspecified osteoarthritis, unspecified site: Secondary | ICD-10-CM | POA: Insufficient documentation

## 2023-07-20 DIAGNOSIS — R7989 Other specified abnormal findings of blood chemistry: Secondary | ICD-10-CM

## 2023-07-20 NOTE — Assessment & Plan Note (Signed)
Low risk screen Treatable, and curable. If left untreated Hep C can lead to cirrhosis and liver failure. Encourage routine testing; recommend repeat testing if risk factors change.  

## 2023-07-20 NOTE — Assessment & Plan Note (Signed)
Body mass index is 32.64 kg/m. Discussed importance of healthy weight management Discussed diet and exercise

## 2023-07-20 NOTE — Assessment & Plan Note (Signed)

## 2023-07-20 NOTE — Assessment & Plan Note (Signed)
2019; seen and cared for at [facility]. Emphasis of chronic HTN control and smoking cessation to assist with future concerns

## 2023-07-20 NOTE — Assessment & Plan Note (Signed)
Patient believes elevation is d/t chronic pain as well as acute ailments like chest fluttering d/t uncontrolled HTN and awareness of HR. Recommend start of cymblata to assist with PRN medication to further assist. Buspar to help with likely anxiety at TID PRN dosing. Recommend 4-6 week follow up with new PCP

## 2023-07-20 NOTE — Assessment & Plan Note (Signed)
Request for hormone check; has gone 4-5 months between cycle Advised that true menopause is defined as 366 days without vaginal bleed; CTM

## 2023-07-20 NOTE — Assessment & Plan Note (Signed)
Chronic, recommend start of wellbutrin to assist F/u as needed- complete cessation recommend for health benefits

## 2023-07-20 NOTE — Assessment & Plan Note (Signed)
Due for lung cancer screening; reports 1+PPD

## 2023-07-20 NOTE — Progress Notes (Signed)
Recommend thyroid ultrasound as TSH is low which may be contributing to HTN. Start hyzaar 100-12.5 as advised while we tease this out.  Slight elevation in creatinine; continue to monitor fluid intake of water.  Bad cholesterol is elevated; LDL goal remains <50. Start Crestor 40 as advised.  Blood sugar average confirms pre-diabetic range; Continue to recommend balanced, lower carb meals. Smaller meal size, adding snacks. Choosing water as drink of choice and increasing purposeful exercise.  Recommend high dose OTC Vit D3 at 5000 IU daily with meal.  Hormones pending completion.

## 2023-07-20 NOTE — Assessment & Plan Note (Signed)
Reports hx of bilateral knee pain- Left knee pain present at time of OV. No edema, erythema or gross malrotation. Upper neck pain noted; discussed stretching options to assist. Avoid NSAIDS given BP; encouraged to stop Goody's powder and use consistent [phone] mg TID PRN of tylenol.

## 2023-07-20 NOTE — Assessment & Plan Note (Signed)
Due for screening for colon cancer; agreeable to cologuard at this time

## 2023-07-20 NOTE — Assessment & Plan Note (Signed)
Low risk screen ?Consented; encouraged to "know your status" ?Recommend repeat screen if risk factors change ? ?

## 2023-07-20 NOTE — Assessment & Plan Note (Signed)
Due for screening for mammogram, denies breast concerns, provided with phone number to call and schedule appointment for mammogram. Encouraged to repeat breast cancer screening every 1-2 years.  

## 2023-07-20 NOTE — Assessment & Plan Note (Signed)
Chronic, uncontrolled Goal remains <119/79 Recommend start of hyzaar 100-12.5 with close f/u

## 2023-07-20 NOTE — Assessment & Plan Note (Signed)
Acute on chronic, untreated Recommend start of cymbalta as well as buspar PRN and trazodone to assist sleep contracted for safety; pt denies SI or HI Will use wellbutrin to assist with weight mgmt and smoking cessation efforts

## 2023-07-21 LAB — CBC WITH DIFFERENTIAL/PLATELET
Basophils Absolute: 0.1 10*3/uL (ref 0.0–0.2)
Basos: 1 %
EOS (ABSOLUTE): 0.1 10*3/uL (ref 0.0–0.4)
Eos: 1 %
Hematocrit: 42.1 % (ref 34.0–46.6)
Hemoglobin: 13.3 g/dL (ref 11.1–15.9)
Immature Grans (Abs): 0 10*3/uL (ref 0.0–0.1)
Immature Granulocytes: 0 %
Lymphocytes Absolute: 2.2 10*3/uL (ref 0.7–3.1)
Lymphs: 27 %
MCH: 27.9 pg (ref 26.6–33.0)
MCHC: 31.6 g/dL (ref 31.5–35.7)
MCV: 88 fL (ref 79–97)
Monocytes Absolute: 0.6 10*3/uL (ref 0.1–0.9)
Monocytes: 7 %
Neutrophils Absolute: 5.1 10*3/uL (ref 1.4–7.0)
Neutrophils: 64 %
Platelets: 184 10*3/uL (ref 150–450)
RBC: 4.77 x10E6/uL (ref 3.77–5.28)
RDW: 12.9 % (ref 11.7–15.4)
WBC: 8 10*3/uL (ref 3.4–10.8)

## 2023-07-21 LAB — TSH: TSH: 0.406 u[IU]/mL — ABNORMAL LOW (ref 0.450–4.500)

## 2023-07-21 LAB — COMPREHENSIVE METABOLIC PANEL
ALT: 17 [IU]/L (ref 0–32)
AST: 13 [IU]/L (ref 0–40)
Albumin: 4 g/dL (ref 3.9–4.9)
Alkaline Phosphatase: 86 [IU]/L (ref 44–121)
BUN/Creatinine Ratio: 15 (ref 9–23)
BUN: 16 mg/dL (ref 6–24)
Bilirubin Total: 0.3 mg/dL (ref 0.0–1.2)
CO2: 24 mmol/L (ref 20–29)
Calcium: 9.2 mg/dL (ref 8.7–10.2)
Chloride: 102 mmol/L (ref 96–106)
Creatinine, Ser: 1.04 mg/dL — ABNORMAL HIGH (ref 0.57–1.00)
Globulin, Total: 2.7 g/dL (ref 1.5–4.5)
Glucose: 178 mg/dL — ABNORMAL HIGH (ref 70–99)
Potassium: 4.3 mmol/L (ref 3.5–5.2)
Sodium: 139 mmol/L (ref 134–144)
Total Protein: 6.7 g/dL (ref 6.0–8.5)
eGFR: 65 mL/min/{1.73_m2} (ref >59)

## 2023-07-21 LAB — TESTOSTERONE,FREE AND TOTAL
Testosterone, Free: 3.4 pg/mL (ref 0.0–4.2)
Testosterone: 55 ng/dL — ABNORMAL HIGH (ref 4–50)

## 2023-07-21 LAB — FSH/LH
FSH: 32 m[IU]/mL
LH: 40.7 m[IU]/mL

## 2023-07-21 LAB — LIPID PANEL
Chol/HDL Ratio: 3.7 {ratio} (ref 0.0–4.4)
Cholesterol, Total: 187 mg/dL (ref 100–199)
HDL: 51 mg/dL (ref >39)
LDL Chol Calc (NIH): 117 mg/dL — ABNORMAL HIGH (ref 0–99)
Triglycerides: 108 mg/dL (ref 0–149)
VLDL Cholesterol Cal: 19 mg/dL (ref 5–40)

## 2023-07-21 LAB — VITAMIN B12: Vitamin B-12: 287 pg/mL (ref 232–1245)

## 2023-07-21 LAB — ESTROGENS, TOTAL: Estrogen: 121 pg/mL

## 2023-07-21 LAB — HEPATITIS C ANTIBODY: Hep C Virus Ab: NONREACTIVE

## 2023-07-21 LAB — ESTRADIOL: Estradiol: 52.5 pg/mL

## 2023-07-21 LAB — VITAMIN D 25 HYDROXY (VIT D DEFICIENCY, FRACTURES): Vit D, 25-Hydroxy: 10.8 ng/mL — ABNORMAL LOW (ref 30.0–100.0)

## 2023-07-21 LAB — HEMOGLOBIN A1C
Est. average glucose Bld gHb Est-mCnc: 120 mg/dL
Hgb A1c MFr Bld: 5.8 % — ABNORMAL HIGH (ref 4.8–5.6)

## 2023-07-21 LAB — HIV ANTIBODY (ROUTINE TESTING W REFLEX): HIV Screen 4th Generation wRfx: NONREACTIVE

## 2023-07-26 ENCOUNTER — Ambulatory Visit
Admission: RE | Admit: 2023-07-26 | Discharge: 2023-07-26 | Disposition: A | Discharge status: Home / Self Care | Payer: No Typology Code available for payment source | Source: Ambulatory Visit | Attending: Family Medicine

## 2023-07-26 DIAGNOSIS — I10 Essential (primary) hypertension: Secondary | ICD-10-CM | POA: Diagnosis present

## 2023-07-26 DIAGNOSIS — E041 Nontoxic single thyroid nodule: Secondary | ICD-10-CM | POA: Diagnosis not present

## 2023-07-26 DIAGNOSIS — R030 Elevated blood-pressure reading, without diagnosis of hypertension: Secondary | ICD-10-CM | POA: Diagnosis not present

## 2023-07-26 DIAGNOSIS — F39 Unspecified mood [affective] disorder: Secondary | ICD-10-CM | POA: Insufficient documentation

## 2023-07-26 DIAGNOSIS — R7989 Other specified abnormal findings of blood chemistry: Secondary | ICD-10-CM | POA: Insufficient documentation

## 2023-08-15 ENCOUNTER — Telehealth: Payer: Self-pay | Admitting: Family Medicine

## 2023-08-15 DIAGNOSIS — Z1331 Encounter for screening for depression: Secondary | ICD-10-CM

## 2023-08-15 MED ORDER — DULOXETINE HCL 20 MG PO CPEP: 40.0000 mg | ORAL_CAPSULE | Freq: Every day | ORAL | 1 refills | Status: DC

## 2023-08-15 NOTE — Telephone Encounter (Signed)
[facility] pharmacy faxed refill request for the following medications:   DULoxetine (CYMBALTA) 20 MG capsule    Please advise

## 2023-08-15 NOTE — Telephone Encounter (Signed)
Please advise 

## 2023-08-15 NOTE — Telephone Encounter (Signed)
40mg  cymbalta sent to pharmacy for 6 month supply

## 2023-09-05 ENCOUNTER — Ambulatory Visit (INDEPENDENT_AMBULATORY_CARE_PROVIDER_SITE_OTHER): Payer: 59 | Admitting: Family Medicine

## 2023-09-05 ENCOUNTER — Encounter: Payer: Self-pay | Admitting: Family Medicine

## 2023-09-05 VITALS — BP 180/112 | HR 78 | Temp 98.2°F | Resp 16 | Wt 209.0 lb

## 2023-09-05 DIAGNOSIS — I10 Essential (primary) hypertension: Secondary | ICD-10-CM | POA: Diagnosis not present

## 2023-09-05 DIAGNOSIS — J208 Acute bronchitis due to other specified organisms: Secondary | ICD-10-CM | POA: Insufficient documentation

## 2023-09-05 DIAGNOSIS — F39 Unspecified mood [affective] disorder: Secondary | ICD-10-CM | POA: Diagnosis not present

## 2023-09-05 LAB — POC COVID19/FLU A&B COMBO
Covid Antigen, POC: NEGATIVE
Influenza A Antigen, POC: NEGATIVE
Influenza B Antigen, POC: NEGATIVE

## 2023-09-05 MED ORDER — AMLODIPINE BESYLATE 10 MG PO TABS: 10.0000 mg | ORAL_TABLET | Freq: Every day | ORAL | 1 refills | Status: DC

## 2023-09-05 MED ORDER — BENZONATATE 100 MG PO CAPS: 100.0000 mg | ORAL_CAPSULE | Freq: Two times a day (BID) | ORAL | 0 refills | Status: DC | PRN

## 2023-09-05 MED ORDER — PREDNISONE 20 MG PO TABS: ORAL_TABLET | ORAL | 0 refills | Status: AC

## 2023-09-05 NOTE — Assessment & Plan Note (Signed)
Presents with a 3-day history of URI symptoms including fever, cough, chest congestion, and nocturnal dyspnea. COVID-19 and influenza tests are negative. Differential includes bronchitis and pneumonia. Lungs are clear on auscultation. Discussed treatment options including prednisone and Tessalon Perles, highlighting that prednisone can reduce inflammation and improve symptoms, while Tessalon Perles can manage the cough. acute - Start prednisone 40 mg daily for 3 days, then 20 mg daily for 3 days - Prescribe Tessalon Perles 100 mg twice daily as needed for cough - Follow up in 2-3 weeks to reassess respiratory symptoms

## 2023-09-05 NOTE — Assessment & Plan Note (Signed)
Chronic Severely elevated blood pressure at 180/112 mmHg. Current regimen of losartan 100 mg and hydrochlorothiazide 12.5 mg is not well tolerated, causing nausea and vomiting. Previous treatment with amlodipine was effective. Discussed risks and benefits of restarting amlodipine, including potential side effects such as edema. Emphasized the importance of blood pressure control to reduce the risk of stroke and heart disease. - Restart amlodipine 10 mg daily - Hold losartan and hydrochlorothiazide - Monitor blood pressure at home - Follow up in 2-3 weeks to reassess blood pressure

## 2023-09-05 NOTE — Patient Instructions (Signed)
VISIT SUMMARY:  Today, we addressed your upper respiratory infection symptoms, high blood pressure, and smoking cessation efforts. We also discussed your mood management and general health maintenance.  YOUR PLAN:  -ACUTE BRONCHITIS: Acute bronchitis is an inflammation of the bronchial tubes in the lungs, often causing cough and chest congestion. We will start you on prednisone to reduce inflammation and Tessalon Perles to manage your cough. Please follow up in 2-3 weeks to reassess your respiratory symptoms.  -HYPERTENSION: Hypertension, or high blood pressure, increases the risk of heart disease and stroke. Your current medications are causing side effects, so we will restart amlodipine and stop losartan and hydrochlorothiazide. Please monitor your blood pressure at home and follow up in 2-3 weeks to reassess.  -TOBACCO USE DISORDER: Tobacco use disorder involves dependence on nicotine. You have significantly reduced your smoking, which is excellent. Continue taking Wellbutrin to help with cravings and aim for complete cessation. We discussed various strategies and support for quitting smoking.  -GENERAL HEALTH MAINTENANCE: We discussed your concerns about weight, a cyst, and irregular periods, which may be related to menopause. We recommend lifestyle modifications for weight management and monitoring your symptoms. We will discuss these further at your follow-up appointment in 2-3 weeks.  INSTRUCTIONS:  Please schedule a follow-up appointment in 2-3 weeks to check your blood pressure and respiratory status. Continue monitoring your blood pressure at home and note any changes in your symptoms.

## 2023-09-05 NOTE — Progress Notes (Signed)
Established patient visit   Patient: Sandra Hines   DOB: [birth year]   53 y.o. Female  MRN: [id number] Visit Date: 09/05/2023  Today's healthcare provider: Ronnald Ramp, MD   Chief Complaint  Patient presents with   Medical Management of Chronic Issues    Patient here to meet provider and follow up on BP, mood and tobacco counseling   URI    Frequency: 3 days ago Symptoms: Fever at night, cough, sneezing, chest congestion.    Subjective     HPI     Medical Management of Chronic Issues    Additional comments: Patient here to meet provider and follow up on BP, mood and tobacco counseling        URI    Additional comments: Frequency: 3 days ago Symptoms: Fever at night, cough, sneezing, chest congestion.       Last edited by Marjie Skiff, CMA on 09/05/2023 11:22 AM.       Discussed the use of AI scribe software for clinical note transcription with the patient, who gave verbal consent to proceed.  History of Present Illness   Sandra Hines is a 53 year old female with hypertension and mood disorder who presents for chronic management and tobacco cessation.  She has been experiencing upper respiratory infection symptoms for the past three days, including nocturnal fever, coughing, sneezing, and chest congestion. She describes difficulty breathing, particularly at night, which began with the onset of her cough. She wakes up between 3 and 4 AM, unable to catch her breath, and needs to sit up or walk around to alleviate the symptoms. She uses her son's inhaler and takes hot showers to help with breathing. She mentions that she has to sleep sitting up due to these episodes. No history of asthma, COPD, or other lung conditions. No sneezing or runny nose, and no significant sore throat, though she occasionally feels irritation on one side of her throat. No wheezing or crackles in her lungs.  Her blood pressure is not well controlled, with a recent reading of  180/112. She is currently on losartan 100 mg and hydrochlorothiazide 12.5 mg for hypertension, but these medications make her feel sick. She previously took amlodipine, which she felt worked better. She has not been monitoring her blood pressure at home recently due to feeling unwell, but last week her readings were as high as 170/82. She is also on Crestor 40 mg daily for cholesterol management.  She has a history of smoking, previously smoking up to a pack a day when drinking, and less when not. Recently, she has reduced her smoking significantly, having only five or six cigarettes in the past week and none in the last two days. She attributes this reduction to feeling unwell and notes that today is one of the better days she has felt all week.  She is on Wellbutrin 150 mg twice daily, Buspar 5 mg three times daily, and Cymbalta 40 mg daily for mood management. She also takes trazodone 25-50 mg as needed for sleep. Her anxiety seems to have worsened since starting Buspar.         Past Medical History:  Diagnosis Date   Hypertension     Medications: Outpatient Medications Prior to Visit  Medication Sig   buPROPion (WELLBUTRIN SR) 150 MG 12 hr tablet 1 tablet daily for 3 days, then 1 tablet twice daily. Stop smoking 14 days after starting medication   busPIRone (BUSPAR) 5 MG tablet Take 1  tablet (5 mg total) by mouth 3 (three) times daily.   DULoxetine (CYMBALTA) 20 MG capsule Take 2 capsules (40 mg total) by mouth daily.   losartan-hydrochlorothiazide (HYZAAR) 100-12.5 MG tablet Take 1 tablet by mouth daily.   rosuvastatin (CRESTOR) 40 MG tablet Take 1 tablet (40 mg total) by mouth daily.   traZODone (DESYREL) 50 MG tablet Take 0.5-1 tablets (25-50 mg total) by mouth at bedtime as needed for sleep.   No facility-administered medications prior to visit.    Review of Systems  Last metabolic panel Lab Results  Component Value Date   GLUCOSE 178 (H) 07/19/2023   NA 139 07/19/2023   K  4.3 07/19/2023   CL 102 07/19/2023   CO2 24 07/19/2023   BUN 16 07/19/2023   CREATININE 1.04 (H) 07/19/2023   EGFR 65 07/19/2023   CALCIUM 9.2 07/19/2023   PROT 6.7 07/19/2023   ALBUMIN 4.0 07/19/2023   LABGLOB 2.7 07/19/2023   BILITOT 0.3 07/19/2023   ALKPHOS 86 07/19/2023   AST 13 07/19/2023   ALT 17 07/19/2023   ANIONGAP 6 11/26/2020   Last lipids Lab Results  Component Value Date   CHOL 187 07/19/2023   HDL 51 07/19/2023   LDLCALC 117 (H) 07/19/2023   TRIG 108 07/19/2023   CHOLHDL 3.7 07/19/2023   Last hemoglobin A1c Lab Results  Component Value Date   HGBA1C 5.8 (H) 07/19/2023   Last thyroid functions Lab Results  Component Value Date   TSH 0.406 (L) 07/19/2023        Objective    BP (!) 180/112 (BP Location: Left Arm, Patient Position: Sitting, Cuff Size: Large)   Pulse 78   Temp 98.2 F (36.8 C) (Oral)   Resp 16   Wt 209 lb (94.8 kg)   SpO2 99%   BMI 32.73 kg/m  BP Readings from Last 3 Encounters:  09/05/23 (!) 180/112  07/19/23 (!) 133/114  12/05/20 (!) 174/102   Wt Readings from Last 3 Encounters:  09/05/23 209 lb (94.8 kg)  07/19/23 208 lb 6.4 oz (94.5 kg)  12/05/20 220 lb (99.8 kg)        Physical Exam  Physical Exam   VITALS: BP- 180/112 HEENT: Tonsils 2+, no tonsillar exudate, no oropharyngeal erythema CHEST: Lungs clear to auscultation, no wheezing or crackles.       Results for orders placed or performed in visit on 09/05/23  POC Covid19/Flu A&B Antigen  Result Value Ref Range   Influenza A Antigen, POC Negative Negative   Influenza B Antigen, POC Negative Negative   Covid Antigen, POC Negative Negative    Assessment & Plan     Problem List Items Addressed This Visit       Cardiovascular and Mediastinum   Primary hypertension - Primary   Chronic Severely elevated blood pressure at 180/112 mmHg. Current regimen of losartan 100 mg and hydrochlorothiazide 12.5 mg is not well tolerated, causing nausea and vomiting.  Previous treatment with amlodipine was effective. Discussed risks and benefits of restarting amlodipine, including potential side effects such as edema. Emphasized the importance of blood pressure control to reduce the risk of stroke and heart disease. - Restart amlodipine 10 mg daily - Hold losartan and hydrochlorothiazide - Monitor blood pressure at home - Follow up in 2-3 weeks to reassess blood pressure      Relevant Medications   amLODipine (NORVASC) 10 MG tablet     Respiratory   Acute bronchitis due to other specified organisms   Presents with  a 3-day history of URI symptoms including fever, cough, chest congestion, and nocturnal dyspnea. COVID-19 and influenza tests are negative. Differential includes bronchitis and pneumonia. Lungs are clear on auscultation. Discussed treatment options including prednisone and Tessalon Perles, highlighting that prednisone can reduce inflammation and improve symptoms, while Tessalon Perles can manage the cough. acute - Start prednisone 40 mg daily for 3 days, then 20 mg daily for 3 days - Prescribe Tessalon Perles 100 mg twice daily as needed for cough - Follow up in 2-3 weeks to reassess respiratory symptoms      Relevant Medications   predniSONE (DELTASONE) 20 MG tablet   benzonatate (TESSALON) 100 MG capsule   Other Relevant Orders   POC Covid19/Flu A&B Antigen (Completed)     Other   Mood disorder (HCC)       Tobacco Use Disorder Significantly reduced smoking to 5-6 cigarettes per week, none in the past two days. On Wellbutrin 150 mg twice daily for smoking cessation but hesitant to fully quit due to fear of side effects. Discussed the importance of complete smoking cessation for overall health and blood pressure control. Explained that Wellbutrin can help reduce cravings and withdrawal symptoms. - Continue Wellbutrin 150 mg twice daily - Encourage complete smoking cessation - Discuss smoking cessation strategies and support  General  Health Maintenance On multiple medications for mood and sleep, including Buspar, Cymbalta, and trazodone. Reports concerns about weight, a cyst, and irregular periods potentially related to menopause. Discussed lifestyle modifications for weight management and monitoring symptoms related to menopause. - Schedule follow-up in 2-3 weeks to discuss mood, weight, cyst, and menstrual irregularities - Monitor weight and consider lifestyle modifications for weight management     Return in about 2 weeks (around 09/19/2023) for URI, HTN , Anxiety.         Ronnald Ramp, MD  [facility] [phone] (phone) [phone] (fax)  [facility]

## 2023-09-15 DIAGNOSIS — J36 Peritonsillar abscess: Secondary | ICD-10-CM | POA: Diagnosis not present

## 2023-09-16 ENCOUNTER — Telehealth: Payer: Self-pay | Admitting: Family Medicine

## 2023-09-16 NOTE — Telephone Encounter (Signed)
 [facility] is requesting refill busPIRone (BUSPAR) 5 MG tablet   & DULoxetine (CYMBALTA) 20 MG capsule  Please advise

## 2023-09-16 NOTE — Telephone Encounter (Signed)
 Refills already sent

## 2023-09-18 NOTE — Telephone Encounter (Signed)
 Received another refill request from [facility] for DULoxetine (CYMBALTA) 20 MG capsule .  Looks like they were sent to [facility] of [facility].  Also received another refill request for    busPIRone (BUSPAR) 5 MG tablet

## 2023-09-19 NOTE — Telephone Encounter (Signed)
 Called and spoke to the pt, it was explained to her both medication that has been requested, has already been filled with refills attached. Cymbalta has been sent to [facility] and Buspar has [facility]. She has been advised to reach out to have the Cymbalta sent to [facility] since she no longer wanted to use [facility]. She verbally stated she understood and would have them change it today.

## 2023-09-25 ENCOUNTER — Ambulatory Visit: Payer: Self-pay | Admitting: Family Medicine

## 2023-09-25 ENCOUNTER — Ambulatory Visit (INDEPENDENT_AMBULATORY_CARE_PROVIDER_SITE_OTHER): Admitting: Family Medicine

## 2023-09-25 ENCOUNTER — Inpatient Hospital Stay
Admission: EM | Admit: 2023-09-25 | Discharge: 2023-10-03 | DRG: 286 | Disposition: A | Discharge status: Other Acute Inpatient Hospital | Source: Ambulatory Visit | Attending: Internal Medicine | Admitting: Internal Medicine

## 2023-09-25 ENCOUNTER — Encounter: Payer: Self-pay | Admitting: Family Medicine

## 2023-09-25 ENCOUNTER — Emergency Department

## 2023-09-25 VITALS — BP 211/154 | HR 102 | Resp 20 | Wt 210.3 lb

## 2023-09-25 DIAGNOSIS — I5033 Acute on chronic diastolic (congestive) heart failure: Secondary | ICD-10-CM | POA: Diagnosis not present

## 2023-09-25 DIAGNOSIS — R0603 Acute respiratory distress: Secondary | ICD-10-CM | POA: Diagnosis not present

## 2023-09-25 DIAGNOSIS — F32A Depression, unspecified: Secondary | ICD-10-CM | POA: Diagnosis not present

## 2023-09-25 DIAGNOSIS — I110 Hypertensive heart disease with heart failure: Secondary | ICD-10-CM | POA: Diagnosis not present

## 2023-09-25 DIAGNOSIS — I429 Cardiomyopathy, unspecified: Secondary | ICD-10-CM | POA: Diagnosis not present

## 2023-09-25 DIAGNOSIS — R5381 Other malaise: Secondary | ICD-10-CM | POA: Diagnosis present

## 2023-09-25 DIAGNOSIS — J9601 Acute respiratory failure with hypoxia: Secondary | ICD-10-CM | POA: Diagnosis not present

## 2023-09-25 DIAGNOSIS — J9690 Respiratory failure, unspecified, unspecified whether with hypoxia or hypercapnia: Secondary | ICD-10-CM | POA: Diagnosis not present

## 2023-09-25 DIAGNOSIS — E875 Hyperkalemia: Secondary | ICD-10-CM | POA: Diagnosis not present

## 2023-09-25 DIAGNOSIS — Z0000 Encounter for general adult medical examination without abnormal findings: Secondary | ICD-10-CM

## 2023-09-25 DIAGNOSIS — R7303 Prediabetes: Secondary | ICD-10-CM | POA: Diagnosis not present

## 2023-09-25 DIAGNOSIS — J208 Acute bronchitis due to other specified organisms: Secondary | ICD-10-CM | POA: Diagnosis not present

## 2023-09-25 DIAGNOSIS — I5031 Acute diastolic (congestive) heart failure: Secondary | ICD-10-CM

## 2023-09-25 DIAGNOSIS — I480 Paroxysmal atrial fibrillation: Secondary | ICD-10-CM

## 2023-09-25 DIAGNOSIS — I161 Hypertensive emergency: Secondary | ICD-10-CM | POA: Diagnosis present

## 2023-09-25 DIAGNOSIS — F17210 Nicotine dependence, cigarettes, uncomplicated: Secondary | ICD-10-CM | POA: Diagnosis present

## 2023-09-25 DIAGNOSIS — R000 Tachycardia, unspecified: Secondary | ICD-10-CM | POA: Diagnosis present

## 2023-09-25 DIAGNOSIS — N179 Acute kidney failure, unspecified: Secondary | ICD-10-CM | POA: Diagnosis not present

## 2023-09-25 DIAGNOSIS — R059 Cough, unspecified: Secondary | ICD-10-CM | POA: Diagnosis not present

## 2023-09-25 DIAGNOSIS — I340 Nonrheumatic mitral (valve) insufficiency: Secondary | ICD-10-CM

## 2023-09-25 DIAGNOSIS — F39 Unspecified mood [affective] disorder: Secondary | ICD-10-CM | POA: Diagnosis not present

## 2023-09-25 DIAGNOSIS — F419 Anxiety disorder, unspecified: Secondary | ICD-10-CM | POA: Diagnosis not present

## 2023-09-25 DIAGNOSIS — I499 Cardiac arrhythmia, unspecified: Secondary | ICD-10-CM | POA: Diagnosis not present

## 2023-09-25 DIAGNOSIS — Z6833 Body mass index (BMI) 33.0-33.9, adult: Secondary | ICD-10-CM | POA: Diagnosis not present

## 2023-09-25 DIAGNOSIS — I083 Combined rheumatic disorders of mitral, aortic and tricuspid valves: Secondary | ICD-10-CM | POA: Diagnosis not present

## 2023-09-25 DIAGNOSIS — E041 Nontoxic single thyroid nodule: Secondary | ICD-10-CM | POA: Diagnosis not present

## 2023-09-25 DIAGNOSIS — I7121 Aneurysm of the ascending aorta, without rupture: Secondary | ICD-10-CM | POA: Diagnosis not present

## 2023-09-25 DIAGNOSIS — Z5986 Financial insecurity: Secondary | ICD-10-CM | POA: Diagnosis not present

## 2023-09-25 DIAGNOSIS — I10 Essential (primary) hypertension: Secondary | ICD-10-CM | POA: Insufficient documentation

## 2023-09-25 DIAGNOSIS — Z880 Allergy status to penicillin: Secondary | ICD-10-CM

## 2023-09-25 DIAGNOSIS — E876 Hypokalemia: Secondary | ICD-10-CM | POA: Diagnosis not present

## 2023-09-25 DIAGNOSIS — I5022 Chronic systolic (congestive) heart failure: Secondary | ICD-10-CM

## 2023-09-25 DIAGNOSIS — E785 Hyperlipidemia, unspecified: Secondary | ICD-10-CM | POA: Diagnosis not present

## 2023-09-25 DIAGNOSIS — Z952 Presence of prosthetic heart valve: Secondary | ICD-10-CM | POA: Diagnosis not present

## 2023-09-25 DIAGNOSIS — I50813 Acute on chronic right heart failure: Secondary | ICD-10-CM | POA: Diagnosis not present

## 2023-09-25 DIAGNOSIS — I517 Cardiomegaly: Secondary | ICD-10-CM | POA: Diagnosis not present

## 2023-09-25 DIAGNOSIS — R002 Palpitations: Secondary | ICD-10-CM | POA: Diagnosis not present

## 2023-09-25 DIAGNOSIS — I38 Endocarditis, valve unspecified: Secondary | ICD-10-CM | POA: Diagnosis not present

## 2023-09-25 DIAGNOSIS — E66811 Obesity, class 1: Secondary | ICD-10-CM | POA: Diagnosis present

## 2023-09-25 DIAGNOSIS — Z79899 Other long term (current) drug therapy: Secondary | ICD-10-CM | POA: Diagnosis not present

## 2023-09-25 DIAGNOSIS — Z7901 Long term (current) use of anticoagulants: Secondary | ICD-10-CM | POA: Diagnosis not present

## 2023-09-25 DIAGNOSIS — I4891 Unspecified atrial fibrillation: Secondary | ICD-10-CM | POA: Diagnosis not present

## 2023-09-25 DIAGNOSIS — R4589 Other symptoms and signs involving emotional state: Secondary | ICD-10-CM | POA: Diagnosis not present

## 2023-09-25 DIAGNOSIS — I509 Heart failure, unspecified: Secondary | ICD-10-CM | POA: Diagnosis not present

## 2023-09-25 DIAGNOSIS — R0989 Other specified symptoms and signs involving the circulatory and respiratory systems: Secondary | ICD-10-CM | POA: Diagnosis not present

## 2023-09-25 DIAGNOSIS — I351 Nonrheumatic aortic (valve) insufficiency: Secondary | ICD-10-CM | POA: Diagnosis not present

## 2023-09-25 DIAGNOSIS — J90 Pleural effusion, not elsewhere classified: Secondary | ICD-10-CM | POA: Diagnosis not present

## 2023-09-25 LAB — URINE DRUG SCREEN, QUALITATIVE (ARMC ONLY)
Amphetamines, Ur Screen: NOT DETECTED
Barbiturates, Ur Screen: NOT DETECTED
Benzodiazepine, Ur Scrn: NOT DETECTED
Cannabinoid 50 Ng, Ur ~~LOC~~: POSITIVE — AB
Cocaine Metabolite,Ur ~~LOC~~: NOT DETECTED
MDMA (Ecstasy)Ur Screen: NOT DETECTED
Methadone Scn, Ur: NOT DETECTED
Opiate, Ur Screen: NOT DETECTED
Phencyclidine (PCP) Ur S: NOT DETECTED
Tricyclic, Ur Screen: NOT DETECTED

## 2023-09-25 LAB — BASIC METABOLIC PANEL
Anion gap: 10 (ref 5–15)
BUN: 23 mg/dL — ABNORMAL HIGH (ref 6–20)
CO2: 22 mmol/L (ref 22–32)
Calcium: 8.7 mg/dL — ABNORMAL LOW (ref 8.9–10.3)
Chloride: 104 mmol/L (ref 98–111)
Creatinine, Ser: 1.19 mg/dL — ABNORMAL HIGH (ref 0.44–1.00)
GFR, Estimated: 56 mL/min — ABNORMAL LOW (ref >60)
Glucose, Bld: 132 mg/dL — ABNORMAL HIGH (ref 70–99)
Potassium: 3.7 mmol/L (ref 3.5–5.1)
Sodium: 136 mmol/L (ref 135–145)

## 2023-09-25 LAB — TROPONIN I (HIGH SENSITIVITY): Troponin I (High Sensitivity): 16 ng/L (ref <18)

## 2023-09-25 LAB — CBC WITH DIFFERENTIAL/PLATELET
Abs Immature Granulocytes: 0.04 10*3/uL (ref 0.00–0.07)
Basophils Absolute: 0.1 10*3/uL (ref 0.0–0.1)
Basophils Relative: 1 %
Eosinophils Absolute: 0.2 10*3/uL (ref 0.0–0.5)
Eosinophils Relative: 1 %
HCT: 37.7 % (ref 36.0–46.0)
Hemoglobin: 12.1 g/dL (ref 12.0–15.0)
Immature Granulocytes: 0 %
Lymphocytes Relative: 41 %
Lymphs Abs: 5.7 10*3/uL — ABNORMAL HIGH (ref 0.7–4.0)
MCH: 28.5 pg (ref 26.0–34.0)
MCHC: 32.1 g/dL (ref 30.0–36.0)
MCV: 88.7 fL (ref 80.0–100.0)
Monocytes Absolute: 0.8 10*3/uL (ref 0.1–1.0)
Monocytes Relative: 6 %
Neutro Abs: 7.2 10*3/uL (ref 1.7–7.7)
Neutrophils Relative %: 51 %
Platelets: 234 10*3/uL (ref 150–400)
RBC: 4.25 MIL/uL (ref 3.87–5.11)
RDW: 14.6 % (ref 11.5–15.5)
Smear Review: NORMAL
WBC: 13.9 10*3/uL — ABNORMAL HIGH (ref 4.0–10.5)
nRBC: 0 % (ref 0.0–0.2)

## 2023-09-25 LAB — T4, FREE: Free T4: 1.16 ng/dL — ABNORMAL HIGH (ref 0.61–1.12)

## 2023-09-25 LAB — BRAIN NATRIURETIC PEPTIDE: B Natriuretic Peptide: 700 pg/mL — ABNORMAL HIGH (ref 0.0–100.0)

## 2023-09-25 LAB — RESP PANEL BY RT-PCR (RSV, FLU A&B, COVID)  RVPGX2
Influenza A by PCR: NEGATIVE
Influenza B by PCR: NEGATIVE
Resp Syncytial Virus by PCR: NEGATIVE
SARS Coronavirus 2 by RT PCR: NEGATIVE

## 2023-09-25 LAB — TSH: TSH: 1.091 u[IU]/mL (ref 0.350–4.500)

## 2023-09-25 LAB — MAGNESIUM: Magnesium: 2.2 mg/dL (ref 1.7–2.4)

## 2023-09-25 MED ORDER — AMIODARONE HCL IN DEXTROSE 360-4.14 MG/200ML-% IV SOLN
60.0000 mg/h | INTRAVENOUS | Status: DC
  Administered 2023-09-25 (×2): 60 mg/h via INTRAVENOUS
  Filled 2023-09-25 (×2): qty 200

## 2023-09-25 MED ORDER — MAGNESIUM HYDROXIDE 400 MG/5ML PO SUSP: 30.0000 mL | Freq: Every day | ORAL | Status: DC | PRN

## 2023-09-25 MED ORDER — SODIUM CHLORIDE 0.9 % IV BOLUS
1000.0000 mL | Freq: Once | INTRAVENOUS | Status: AC
  Administered 2023-09-25: 1000 mL via INTRAVENOUS

## 2023-09-25 MED ORDER — LORAZEPAM 2 MG/ML IJ SOLN
1.0000 mg | Freq: Once | INTRAMUSCULAR | Status: AC
  Administered 2023-09-25: 1 mg via INTRAVENOUS
  Filled 2023-09-25: qty 1

## 2023-09-25 MED ORDER — ONDANSETRON HCL 4 MG/2ML IJ SOLN
4.0000 mg | Freq: Four times a day (QID) | INTRAMUSCULAR | Status: DC | PRN
  Administered 2023-09-26: 4 mg via INTRAVENOUS
  Filled 2023-09-25 (×3): qty 2

## 2023-09-25 MED ORDER — ALPRAZOLAM 0.25 MG PO TABS: 0.2500 mg | ORAL_TABLET | Freq: Two times a day (BID) | ORAL | Status: DC | PRN

## 2023-09-25 MED ORDER — APIXABAN 5 MG PO TABS: 5.0000 mg | ORAL_TABLET | Freq: Two times a day (BID) | ORAL | Status: DC

## 2023-09-25 MED ORDER — AMLODIPINE BESYLATE 10 MG PO TABS
10.0000 mg | ORAL_TABLET | Freq: Every day | ORAL | Status: DC
Start: 2023-09-25 — End: 2023-10-03
  Administered 2023-09-27 – 2023-10-02 (×5): 10 mg via ORAL
  Filled 2023-09-25: qty 1
  Filled 2023-09-25 (×3): qty 2
  Filled 2023-09-25: qty 1

## 2023-09-25 MED ORDER — AMIODARONE HCL IN DEXTROSE 360-4.14 MG/200ML-% IV SOLN
30.0000 mg/h | INTRAVENOUS | Status: DC
  Administered 2023-09-26: 60 mg/h via INTRAVENOUS
  Administered 2023-09-26: 30 mg/h via INTRAVENOUS
  Administered 2023-09-26: 60 mg/h via INTRAVENOUS
  Administered 2023-09-26 – 2023-09-27 (×2): 30 mg/h via INTRAVENOUS
  Administered 2023-09-27: 60 mg/h via INTRAVENOUS
  Administered 2023-09-28 – 2023-09-30 (×4): 30 mg/h via INTRAVENOUS
  Filled 2023-09-25 (×11): qty 200

## 2023-09-25 MED ORDER — ROSUVASTATIN CALCIUM 10 MG PO TABS
40.0000 mg | ORAL_TABLET | Freq: Every day | ORAL | Status: DC
  Administered 2023-09-26 – 2023-10-03 (×7): 40 mg via ORAL
  Filled 2023-09-25 (×7): qty 4

## 2023-09-25 MED ORDER — BENZONATATE 100 MG PO CAPS: 100.0000 mg | ORAL_CAPSULE | Freq: Two times a day (BID) | ORAL | Status: DC | PRN

## 2023-09-25 MED ORDER — MORPHINE SULFATE (PF) 2 MG/ML IV SOLN
2.0000 mg | INTRAVENOUS | Status: DC | PRN
  Administered 2023-09-26 – 2023-09-27 (×4): 2 mg via INTRAVENOUS
  Filled 2023-09-25 (×4): qty 1

## 2023-09-25 MED ORDER — AMIODARONE LOAD VIA INFUSION
150.0000 mg | Freq: Once | INTRAVENOUS | Status: AC
  Administered 2023-09-25: 150 mg via INTRAVENOUS
  Filled 2023-09-25: qty 83.34

## 2023-09-25 MED ORDER — LOSARTAN POTASSIUM-HCTZ 100-12.5 MG PO TABS: 1.0000 | ORAL_TABLET | Freq: Every day | ORAL | Status: DC

## 2023-09-25 MED ORDER — POTASSIUM CHLORIDE 20 MEQ PO PACK
40.0000 meq | PACK | Freq: Once | ORAL | Status: AC
  Administered 2023-09-25: 40 meq via ORAL
  Filled 2023-09-25: qty 2

## 2023-09-25 MED ORDER — BENZONATATE 100 MG PO CAPS
100.0000 mg | ORAL_CAPSULE | Freq: Once | ORAL | Status: AC
  Administered 2023-09-25: 100 mg via ORAL
  Filled 2023-09-25: qty 1

## 2023-09-25 MED ORDER — BUSPIRONE HCL 10 MG PO TABS
5.0000 mg | ORAL_TABLET | Freq: Three times a day (TID) | ORAL | Status: DC
  Administered 2023-09-25 – 2023-10-03 (×22): 5 mg via ORAL
  Filled 2023-09-25 (×20): qty 1

## 2023-09-25 MED ORDER — TRAZODONE HCL 50 MG PO TABS
25.0000 mg | ORAL_TABLET | Freq: Every evening | ORAL | Status: DC | PRN
  Administered 2023-10-01: 25 mg via ORAL
  Administered 2023-10-02: 50 mg via ORAL
  Filled 2023-09-25 (×2): qty 1

## 2023-09-25 MED ORDER — DULOXETINE HCL 20 MG PO CPEP
40.0000 mg | ORAL_CAPSULE | Freq: Every day | ORAL | Status: DC
  Administered 2023-09-26 – 2023-10-03 (×7): 40 mg via ORAL
  Filled 2023-09-25 (×9): qty 2

## 2023-09-25 MED ORDER — DILTIAZEM HCL 25 MG/5ML IV SOLN
10.0000 mg | Freq: Once | INTRAVENOUS | Status: AC
  Administered 2023-09-25: 10 mg via INTRAVENOUS
  Filled 2023-09-25: qty 5

## 2023-09-25 MED ORDER — METOPROLOL TARTRATE 5 MG/5ML IV SOLN
5.0000 mg | Freq: Once | INTRAVENOUS | Status: AC
  Administered 2023-09-25: 5 mg via INTRAVENOUS
  Filled 2023-09-25: qty 5

## 2023-09-25 MED ORDER — ACETAMINOPHEN 325 MG PO TABS
650.0000 mg | ORAL_TABLET | ORAL | Status: DC | PRN
  Administered 2023-09-26 – 2023-10-02 (×5): 650 mg via ORAL
  Filled 2023-09-25 (×5): qty 2

## 2023-09-25 MED ORDER — BUPROPION HCL ER (SR) 150 MG PO TB12
150.0000 mg | ORAL_TABLET | Freq: Every day | ORAL | Status: DC
  Administered 2023-09-26 – 2023-10-03 (×7): 150 mg via ORAL
  Filled 2023-09-25 (×8): qty 1

## 2023-09-25 MED ORDER — FUROSEMIDE 10 MG/ML IJ SOLN
40.0000 mg | Freq: Two times a day (BID) | INTRAMUSCULAR | Status: DC
  Administered 2023-09-25 – 2023-09-26 (×3): 40 mg via INTRAVENOUS
  Filled 2023-09-25 (×4): qty 4

## 2023-09-25 MED ORDER — DILTIAZEM HCL-DEXTROSE 125-5 MG/125ML-% IV SOLN (PREMIX)
5.0000 mg/h | INTRAVENOUS | Status: DC
  Administered 2023-09-25 – 2023-09-26 (×2): 5 mg/h via INTRAVENOUS
  Administered 2023-09-26: 15 mg/h via INTRAVENOUS
  Filled 2023-09-25 (×3): qty 125

## 2023-09-25 NOTE — Assessment & Plan Note (Signed)
-   I suspect diastolic etiology. - The patient will be diuresed with IV Lasix. - We will obtain a 2D echo and cardiology consult as mentioned above. - We will follow serial troponins.

## 2023-09-25 NOTE — Assessment & Plan Note (Signed)
-   We will continue Wellbutrin SR and BuSpar as well as Cymbalta.

## 2023-09-25 NOTE — ED Triage Notes (Signed)
 Pt to ED via [facility] from dr's office. EMS was called out for afib with rvr. Pt reports feeling like her heart has been fluttering since 1am today. Pt recently treated for tonsillitis. Cough present  Initial Hr 190-220's 6mg  adenosine given Hr now 160-180's  BP 101/80 after adenosine

## 2023-09-25 NOTE — Assessment & Plan Note (Addendum)
-   The patient be admitted to a progressive unit bed. - We will continue her on IV amiodarone drip and Cardizem drip. - Her CHA2DS2-VASc score is now 3.  She will benefit from anticoagulation. - We will start p.o. Eliquis in a.m. with cardiology approval. - We will obtain a 2D echo and a cardiology consult. - Will optimize electrolytes. - Dr. Mayford Knife and [facility] group were notified about the patient.

## 2023-09-25 NOTE — ED Notes (Signed)
Pt assisted onto the bedside commode

## 2023-09-25 NOTE — ED Notes (Signed)
 Pt on bedside commode.

## 2023-09-25 NOTE — ED Provider Notes (Signed)
 [facility] Provider Note    Event Date/Time   First MD Initiated Contact with Patient 09/25/23 1702     (approximate)   History   Tachycardia  HPI  Sandra Hines is a 53 y.o. female   who presents to the emergency department today from primary care doctor's office because of concerns for new onset A-fib with RVR.  The patient states that she has not been feeling well for the past few days.  She has been seen for tonsillitis but on top of that she has noticed some shortness of breath and the feeling of an ease.  She noticed it primarily when she would be trying to go to bed or lying flat.  She states that she does not have a history of any abnormal heart rhythms or history of having her heart go fast.      Physical Exam   Triage Vital Signs: ED Triage Vitals  Encounter Vitals Group     BP 09/25/23 1704 (!) 111/97     Systolic BP Percentile --      Diastolic BP Percentile --      Pulse Rate 09/25/23 1704 (!) 176     Resp 09/25/23 1704 (!) 22     Temp 09/25/23 1704 98.4 F (36.9 C)     Temp Source 09/25/23 1704 Oral     SpO2 09/25/23 1704 100 %     Weight 09/25/23 1703 209 lb (94.8 kg)     Height 09/25/23 1703 5\' 7"  (1.702 m)     Head Circumference --      Peak Flow --      Pain Score 09/25/23 1702 7     Pain Loc --      Pain Education --      Exclude from Growth Chart --     Most recent vital signs: Vitals:   09/25/23 1704  BP: (!) 111/97  Pulse: (!) 176  Resp: (!) 22  Temp: 98.4 F (36.9 C)  SpO2: 100%   General: Awake, alert, oriented. CV:  Good peripheral perfusion. Irregularly irregular rhythm, tachycardia. Resp:  Normal effort. Lungs clear. Abd:  No distention.    ED Results / Procedures / Treatments   Labs (all labs ordered are listed, but only abnormal results are displayed) Labs Reviewed  CBC WITH DIFFERENTIAL/PLATELET - Abnormal; Notable for the following components:      Result Value   WBC 13.9 (*)    Lymphs Abs 5.7  (*)    All other components within normal limits  BASIC METABOLIC PANEL - Abnormal; Notable for the following components:   Glucose, Bld 132 (*)    BUN 23 (*)    Creatinine, Ser 1.19 (*)    Calcium 8.7 (*)    GFR, Estimated 56 (*)    All other components within normal limits  RESP PANEL BY RT-PCR (RSV, FLU A&B, COVID)  [id number]  MAGNESIUM  TSH  T4, FREE  URINE DRUG SCREEN, QUALITATIVE ([facility] ONLY)  TROPONIN I (HIGH SENSITIVITY)     EKG  I, Phineas Semen, attending physician, personally viewed and interpreted this EKG  EKG Time: 1704 Rate: 184 Rhythm: atrial fibrillation with RVR Axis: normal Intervals: qtc 508 QRS: narrow ST changes: no st elevation Impression: abnormal ekg   RADIOLOGY I independently interpreted and visualized the CXR. My interpretation: Cardiomegaly Radiology interpretation:  IMPRESSION:  1. Mild cardiomegaly with mild central pulmonary vascular  congestion.  2. 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     PROCEDURES:  Critical Care performed: Yes  CRITICAL CARE Performed by: Phineas Semen   Total critical care time: 35 minutes  Critical care time was exclusive of separately billable procedures and treating other patients.  Critical care was necessary to treat or prevent imminent or life-threatening deterioration.  Critical care was time spent personally by me on the following activities: development of treatment plan with patient and/or surrogate as well as nursing, discussions with consultants, evaluation of patient's response to treatment, examination of patient, obtaining history from patient or surrogate, ordering and performing treatments and interventions, ordering and review of laboratory studies, ordering and review of radiographic studies, pulse oximetry and re-evaluation of patient's condition.   Procedures    MEDICATIONS ORDERED IN ED: Medications - No data to  display   IMPRESSION / MDM / ASSESSMENT AND PLAN / ED COURSE  I reviewed the triage vital signs and the nursing notes.                              Differential diagnosis includes, but is not limited to, arrhythmia, electrolyte abnormality, infection, dehydration  Patient's presentation is most consistent with acute presentation with potential threat to life or bodily function.   The patient is on the cardiac monitor to evaluate for evidence of arrhythmia and/or significant heart rate changes.  Patient presented to the emergency department today from primary care doctor's office because of concerns for new onset A-fib with RVR.  On exam patient is significantly tachycardic.  Did initially try giving a dose of diltiazem which did not seem to affect the patient's rhythm significantly.  Additionally tried metoprolol dose which slowed the heart rate somewhat.  However continue be elevated.  Additionally patient was becoming anxious.  Did give patient small dose of Ativan which did help calm the patient down.  I discussed with Dr. Mayford Knife with cardiology.  Recommended amnio bolus and infusion at this time.  Discussed with Dr. Arville Care with the hospitalist service who will evaluate for admission.     FINAL CLINICAL IMPRESSION(S) / ED DIAGNOSES   Final diagnoses:  Atrial fibrillation with RVR (HCC)     Note:  This document was prepared using Dragon voice recognition software and may include unintentional dictation errors.    Phineas Semen, MD 09/25/23 2030

## 2023-09-25 NOTE — Assessment & Plan Note (Signed)
-   Okay statin therapy.

## 2023-09-25 NOTE — Assessment & Plan Note (Signed)
-   We will continue antihypertensive therapy.

## 2023-09-25 NOTE — H&P (Signed)
 [place]   PATIENT NAME: Sandra Hines    MR#:  [id number]  DATE OF BIRTH:  [birth year]  DATE OF ADMISSION:  09/25/2023  PRIMARY CARE PHYSICIAN: Ronnald Ramp, MD   Patient is coming from: Home  REQUESTING/REFERRING PHYSICIAN: Phineas Semen, MD  CHIEF COMPLAINT:  Palpitations and malaise  HISTORY OF PRESENT ILLNESS:  Sandra Hines is a 53 y.o. female with medical history significant for essential hypertension and cerebral aneurysm s/p repair, who presented to the emergency room with acute onset of palpitations today with a feeling of generalized weakness and malaise over the last couple weeks.  She admits to cough which has been mainly dry with estimated mild dyspnea and orthopnea.  She admits to dyspnea on exertion and paroxysmal nocturnal dyspnea but denied any worsening lower extremity edema.  No fever but she has been having chills.  She smokes 2 cigarettes/day.  No nausea or vomiting or abdominal pain.  She denies chest pain.  No dysuria, oliguria or hematuria or flank pain.  No bleeding diathesis.  ED Course: When she is in the ER, BP was 111/97 with heart rate 176 and respiratory rate of 22+100% on room air.  Labs revealed a creatinine of 1.19 compared to 1.04 on 07/19/2023 and a BUN of 23 with otherwise unremarkable BMP.  Potassium was 3.7 and I added magnesium and came back 2.2.  BNP came back elevated at 700 and high-sensitivity troponin I was 16.  CBC showed leukocytosis of 13.9.  TSH was 1.091 and free T4 was 1.16 minimally above the normal level.  Respiratory panel came back negative.   EKG as reviewed by me : EKG showed suspected atrial fibrillation with a rate of 184 with repolarization abnormality. Imaging: Portable chest x-ray showed mild cardiomegaly with mild central pulmonary vascular congestion and findings likely consistent with sequelae associated with prior granulomatous disease.  The patient was given 1 mg of IV Ativan, Lopressor 5 mg IV, IV  Cardizem drip, 1 L bolus of IV normal saline and after consultation with Dr. Mayford Knife given lack of response to IV Cardizem she was started on IV amiodarone with bolus and drip.  She was also given Lawyer.  I added 40 mill equivalent p.o. potassium chloride.  She will be admitted to a progressive unit bed for further evaluation and management. PAST MEDICAL HISTORY:   Past Medical History:  Diagnosis Date   Hypertension     PAST SURGICAL HISTORY:   Repair of ruptured cerebral aneurysm.  SOCIAL HISTORY:   Social History   Tobacco Use   Smoking status: Every Day    Current packs/day: 1.00    Types: Cigarettes   Smokeless tobacco: Never  Substance Use Topics   Alcohol use: No    FAMILY HISTORY:  History reviewed. No pertinent family history.  DRUG ALLERGIES:   Allergies  Allergen Reactions   Penicillins Hives    Has patient had a PCN reaction causing immediate rash, facial/tongue/throat swelling, SOB or lightheadedness with hypotension: No Has patient had a PCN reaction causing severe rash involving mucus membranes or skin necrosis: No Has patient had a PCN reaction that required hospitalization: No Has patient had a PCN reaction occurring within the last 10 years: No If all of the above answers are "NO", then may proceed with Cephalosporin use.     REVIEW OF SYSTEMS:   ROS As per history of present illness. All pertinent systems were reviewed above. Constitutional, HEENT, cardiovascular, respiratory, GI, GU, musculoskeletal, neuro,  psychiatric, endocrine, integumentary and hematologic systems were reviewed and are otherwise negative/unremarkable except for positive findings mentioned above in the HPI.   MEDICATIONS AT HOME:   Prior to Admission medications   Medication Sig Start Date End Date Taking? Authorizing Provider  amLODipine (NORVASC) 10 MG tablet Take 1 tablet (10 mg total) by mouth daily. 09/05/23   Simmons-Robinson, Tawanna Cooler, MD  benzonatate (TESSALON)  100 MG capsule Take 1 capsule (100 mg total) by mouth 2 (two) times daily as needed for cough. 09/05/23   Simmons-Robinson, Tawanna Cooler, MD  buPROPion (WELLBUTRIN SR) 150 MG 12 hr tablet 1 tablet daily for 3 days, then 1 tablet twice daily. Stop smoking 14 days after starting medication 07/19/23 11/16/23  Jacky Kindle, FNP  busPIRone (BUSPAR) 5 MG tablet Take 1 tablet (5 mg total) by mouth 3 (three) times daily. 07/19/23   Jacky Kindle, FNP  DULoxetine (CYMBALTA) 20 MG capsule Take 2 capsules (40 mg total) by mouth daily. 08/15/23   Simmons-Robinson, Makiera, MD  losartan-hydrochlorothiazide (HYZAAR) 100-12.5 MG tablet Take 1 tablet by mouth daily. 07/19/23   Jacky Kindle, FNP  rosuvastatin (CRESTOR) 40 MG tablet Take 1 tablet (40 mg total) by mouth daily. 07/19/23   Jacky Kindle, FNP  traZODone (DESYREL) 50 MG tablet Take 0.5-1 tablets (25-50 mg total) by mouth at bedtime as needed for sleep. 07/19/23   Jacky Kindle, FNP      VITAL SIGNS:  Blood pressure (!) 126/104, pulse (!) 150, temperature 98.9 F (37.2 C), temperature source Oral, resp. rate (!) 27, height 5\' 7"  (1.702 m), weight 94.8 kg, SpO2 95%.  PHYSICAL EXAMINATION:  Physical Exam  GENERAL:  53 y.o.-year-old patient lying in the bed with mild respiratory distress with conversational dyspnea. EYES: Pupils equal, round, reactive to light and accommodation. No scleral icterus. Extraocular muscles intact.  HEENT: Head atraumatic, normocephalic. Oropharynx and nasopharynx clear.  NECK:  Supple, no jugular venous distention. No thyroid enlargement, no tenderness.  LUNGS: Diminished bibasal breath sounds with mild bibasal rales.. No use of accessory muscles of respiration.  CARDIOVASCULAR: Irregularly irregular tachycardic rhythm, S1, S2 normal. No murmurs, rubs, or gallops.  ABDOMEN: Soft, nondistended, nontender. Bowel sounds present. No organomegaly or mass.  EXTREMITIES: No pedal edema, cyanosis, or clubbing.  NEUROLOGIC: Cranial  nerves II through XII are intact. Muscle strength 5/5 in all extremities. Sensation intact. Gait not checked.  PSYCHIATRIC: The patient is alert and oriented x 3.  Normal affect and good eye contact. SKIN: No obvious rash, lesion, or ulcer.   LABORATORY PANEL:   CBC Recent Labs  Lab 09/25/23 1711  WBC 13.9*  HGB 12.1  HCT 37.7  PLT 234   ------------------------------------------------------------------------------------------------------------------  Chemistries  Recent Labs  Lab 09/25/23 1711  NA 136  K 3.7  CL 104  CO2 22  GLUCOSE 132*  BUN 23*  CREATININE 1.19*  CALCIUM 8.7*  MG 2.2   ------------------------------------------------------------------------------------------------------------------  Cardiac Enzymes No results for input(s): "TROPONINI" in the last 168 hours. ------------------------------------------------------------------------------------------------------------------  RADIOLOGY:  DG Chest Portable 1 View Result Date: 09/25/2023 CLINICAL DATA:  Cough and atrial fibrillation. EXAM: PORTABLE CHEST 1 VIEW COMPARISON:  August 23, 2015 FINDINGS: The cardiac silhouette is mildly enlarged. Mild prominence of the central pulmonary vasculature is seen. Ill-defined subcentimeter calcified pulmonary nodules are seen scattered throughout both lungs. No focal consolidation, pleural effusion or pneumothorax is identified. The visualized skeletal structures are unremarkable. IMPRESSION: 1. Mild cardiomegaly with mild central pulmonary vascular congestion. 2. Findings likely consistent with  sequelae associated with prior granulomatous disease. Electronically Signed   By: Aram Candela M.D.   On: 09/25/2023 20:20      IMPRESSION AND PLAN:  Assessment and Plan: * Atrial fibrillation with rapid ventricular response (HCC) - The patient be admitted to a progressive unit bed. - We will continue her on IV amiodarone drip and Cardizem drip. - Her CHA2DS2-VASc score  is now 3.  She will benefit from anticoagulation. - We will start p.o. Eliquis in a.m. with cardiology approval. - We will obtain a 2D echo and a cardiology consult. - Will optimize electrolytes. - Dr. Mayford Knife and [facility] group were notified about the patient.  Acute CHF (congestive heart failure) (HCC) - I suspect diastolic etiology. - The patient will be diuresed with IV Lasix. - We will obtain a 2D echo and cardiology consult as mentioned above. - We will follow serial troponins.  Acute bronchitis due to other specified organisms - Will be placed on p.o. Augmentin and mucolytic therapy.  Dyslipidemia - Okay statin therapy.  Anxiety and depression - We will continue Wellbutrin SR and BuSpar as well as Cymbalta.  Essential hypertension - We will continue antihypertensive therapy.   DVT prophylaxis: Lovenox.  Advanced Care Planning:  Code Status: full code.  Family Communication:  The plan of care was discussed in details with the patient (and family). I answered all questions. The patient agreed to proceed with the above mentioned plan. Further management will depend upon hospital course. Disposition Plan: Back to previous home environment Consults called: Cardiology All the records are reviewed and case discussed with ED provider.  Status is: Inpatient  At the time of the admission, it appears that the appropriate admission status for this patient is inpatient.  This is judged to be reasonable and necessary in order to provide the required intensity of service to ensure the patient's safety given the presenting symptoms, physical exam findings and initial radiographic and laboratory data in the context of comorbid conditions.  The patient requires inpatient status due to high intensity of service, high risk of further deterioration and high frequency of surveillance required.  I certify that at the time of admission, it is my clinical judgment that the patient will require  inpatient hospital care extending more than 2 midnights.                            Dispo: The patient is from: Home              Anticipated d/c is to: Home              Patient currently is not medically stable to d/c.              Difficult to place patient: No  Hannah Beat M.D on 09/25/2023 at 10:35 PM  [facility]   From 7 PM-7 AM, contact night-coverage [email or web address]  CC: Primary care physician; Ronnald Ramp, MD

## 2023-09-25 NOTE — Telephone Encounter (Signed)
 Copied from CRM [id number]. Topic: Clinical - Red Word Triage >> Sep 25, 2023  2:27 PM Elle L wrote: Red Word that prompted transfer to Nurse Triage: The patient has a worsening cough and shortness of breath and she is having panic attacks at night which woke her up at 1 AM and she has not been able to go back to sleep.   Chief Complaint: SOB Symptoms: SOB, cough, panic attack Frequency: Ongoing for the past 4 days Disposition: [] ED /[] Urgent Care (no appt availability in office) / [x] Appointment(In office/virtual)/ []  [place] [facility]/ [] Home Care/ [] Refused Recommended Disposition /[] [place] [facility]/ []  Follow-up with PCP Additional Notes: Patient stated that she is having SOB and panic attacks during the middle of the night for the past 4 days. She is usually okay when sitting or moving around. The SOB occurs when reclining or laying down. Appointment has been scheduled for this afternoon.   Reason for Disposition  [1] MILD difficulty breathing (e.g., minimal/no SOB at rest, SOB with walking, pulse <100) AND [2] NEW-onset or WORSE than normal    SOB when reclining/ laying down  Answer Assessment - Initial Assessment Questions 1. RESPIRATORY STATUS: "Describe your breathing?" (e.g., wheezing, shortness of breath, unable to speak, severe coughing)      SOB when laying back or laying down, coughing and congestion. Patient having panic attacks in the middle of the night due to feeling like she cannot breathe.  2. ONSET: "When did this breathing problem begin?"      Happening every night for past 4 nights  3. PATTERN "Does the difficult breathing come and go, or has it been constant since it started?"      Comes and goes  4. SEVERITY: "How bad is your breathing?" (e.g., mild, moderate, severe)     No SOB right now, but it is bad in the middle of the night and causes patient to have panic attacks.  5. LUNG HISTORY: "Do you have any history of lung disease?"  (e.g., pulmonary  embolus, asthma, emphysema) No  6.  OTHER SYMPTOMS: "Do you have any other symptoms? (e.g., dizziness, runny nose, cough, chest pain, fever)     Constant cough at night and patient feels hot sometimes and wakes up soaking wet  Protocols used: Breathing Difficulty-A-AH

## 2023-09-25 NOTE — Assessment & Plan Note (Signed)
-   Will be placed on p.o. Augmentin and mucolytic therapy.

## 2023-09-25 NOTE — ED Notes (Signed)
 Pt assisted onto bedside commode.

## 2023-09-25 NOTE — Progress Notes (Signed)
 Established patient visit   Patient: Sandra Hines   DOB: [birth year]   53 y.o. Female  MRN: [id number] Visit Date: 09/25/2023  Today's healthcare provider: Ronnald Ramp, MD   Chief Complaint  Patient presents with   Cough    With intermittent episodes of SOB- reports she was seen 2 weeks ago. Symptoms are worsening.   Panic Attack    In middle of the night   Subjective     HPI     Cough    Additional comments: With intermittent episodes of SOB- reports she was seen 2 weeks ago. Symptoms are worsening.        Panic Attack    Additional comments: In middle of the night      Last edited by Marjie Skiff, CMA on 09/25/2023  4:08 PM.       Discussed the use of AI scribe software for clinical note transcription with the patient, who gave verbal consent to proceed.  History of Present Illness   Sandra Hines is a 53 year old female who presents with severely elevated blood pressure and symptoms suggestive of atrial fibrillation.  She presents with severely elevated blood pressure of 211/154 mmHg and symptoms suggestive of atrial fibrillation. She describes a sensation of her heart 'going different ways' and a 'knot' in the center of her chest. No chest pain is reported, but she feels as if something is stuck in her chest.  The symptoms began when she woke up at 1:30 AM and persisted throughout the day, including while at work. She felt as though she was dozing off and might not wake up, prompting her to seek medical attention. She has experienced similar episodes in the past, typically resolving after a few hours, but this episode did not subside.  She reports shortness of breath, increased anxiety, and sweating. No fever or chills are present, but she mentions feeling hot at work. She has a history of a persistent cough and congestion, previously treated at urgent care for tonsillitis. The cough was causing pain in the middle of her back, which has since  resolved. She is currently experiencing wheezing and a sensation of respiratory distress, particularly when lying supine.         Past Medical History:  Diagnosis Date   Hypertension     Medications: Outpatient Medications Prior to Visit  Medication Sig   amLODipine (NORVASC) 10 MG tablet Take 1 tablet (10 mg total) by mouth daily.   benzonatate (TESSALON) 100 MG capsule Take 1 capsule (100 mg total) by mouth 2 (two) times daily as needed for cough.   buPROPion (WELLBUTRIN SR) 150 MG 12 hr tablet 1 tablet daily for 3 days, then 1 tablet twice daily. Stop smoking 14 days after starting medication   busPIRone (BUSPAR) 5 MG tablet Take 1 tablet (5 mg total) by mouth 3 (three) times daily.   DULoxetine (CYMBALTA) 20 MG capsule Take 2 capsules (40 mg total) by mouth daily.   losartan-hydrochlorothiazide (HYZAAR) 100-12.5 MG tablet Take 1 tablet by mouth daily.   rosuvastatin (CRESTOR) 40 MG tablet Take 1 tablet (40 mg total) by mouth daily.   traZODone (DESYREL) 50 MG tablet Take 0.5-1 tablets (25-50 mg total) by mouth at bedtime as needed for sleep.   No facility-administered medications prior to visit.    Review of Systems      Objective    BP (!) 211/154 (BP Location: Left Arm, Patient Position: Sitting, Cuff Size: Normal)  Pulse (!) 102   Resp 20   Wt 210 lb 4.8 oz (95.4 kg)   SpO2 100%   BMI 32.94 kg/m  BP Readings from Last 3 Encounters:  09/25/23 (!) 211/154  09/05/23 (!) 180/112  07/19/23 (!) 133/114   Wt Readings from Last 3 Encounters:  09/25/23 210 lb 4.8 oz (95.4 kg)  09/05/23 209 lb (94.8 kg)  07/19/23 208 lb 6.4 oz (94.5 kg)        Physical Exam  Physical Exam   VITALS: T- 98.4, P- 182, BP- 240/100, SaO2- 99% GENERAL: Tearful appearance. CHEST: Wheezing in the upper left lobe, crackles present, respiratory distress while supine. CARDIOVASCULAR: Tachycardic, irregular rhythm, no discernible P waves.       No results found for any visits on  09/25/23.  Assessment & Plan     Problem List Items Addressed This Visit   None       Atrial Fibrillation with Rapid Ventricular Response (RVR) Severely elevated heart rate (182 bpm) and irregular rhythm on EKG, consistent with atrial fibrillation with RVR. Symptoms include palpitations, shortness of breath, and chest discomfort. Acute and severe, requiring immediate intervention to prevent complications such as stroke. No prior history of atrial fibrillation. Discussed risks of untreated atrial fibrillation and need for urgent treatment. Concerned for new onset Atrial fibrillation, orthopnea concerning for flash pulmonary edema or PE Recommended urgent evaluation and monitoring in emergency dept  - Transfer to hospital via EMS for acute management - Initiate IV medications to control heart rate - Continuous cardiac monitoring - Obtain labs at the hospital  Hypertensive Emergency Critically high blood pressure of 211/154 mmHg, requiring immediate intervention to prevent end-organ damage. Discussed risks of untreated hypertensive emergency, including stroke and heart failure, and need for urgent treatment. - Transfer to hospital via EMS for acute management - Initiate IV antihypertensive medications - Continuous blood pressure monitoring  Flash Pulmonary Edema? Symptoms of shortness of breath and wheezing,fine crackles at lung bases, particularly when supine, indicative of flash pulmonary edema secondary to rapid heart rate and hypertensive emergency. Crackles on lung auscultation suggest fluid accumulation. Explained that rapid heart rate can lead to flash pulmonary edema, contributing to shortness of breath. - Transfer to hospital via EMS for acute management - Initiate treatment for possible pulmonary edema as needed at the hospital - Continuous respiratory monitoring  Upper Respiratory Infection Recent upper respiratory symptoms, including persistent cough and congestion, possibly  contributing to current cardiac stress. Previously diagnosed with tonsillitis and treated with antibiotics. Explained that respiratory illness may have stressed the heart, contributing to current condition. - Monitor respiratory symptoms at the hospital - Consider further evaluation and treatment for any ongoing infection       No follow-ups on file.         Ronnald Ramp, MD  [facility] [phone] (phone) [phone] (fax)  [facility]

## 2023-09-26 ENCOUNTER — Inpatient Hospital Stay

## 2023-09-26 ENCOUNTER — Inpatient Hospital Stay (HOSPITAL_COMMUNITY)
Admit: 2023-09-26 | Discharge: 2023-09-26 | Disposition: A | Discharge status: Home / Self Care | Attending: Family Medicine | Admitting: Family Medicine

## 2023-09-26 ENCOUNTER — Telehealth (HOSPITAL_COMMUNITY): Payer: Self-pay

## 2023-09-26 ENCOUNTER — Other Ambulatory Visit (HOSPITAL_COMMUNITY): Payer: Self-pay

## 2023-09-26 ENCOUNTER — Other Ambulatory Visit: Payer: Self-pay

## 2023-09-26 DIAGNOSIS — I4891 Unspecified atrial fibrillation: Secondary | ICD-10-CM

## 2023-09-26 DIAGNOSIS — I10 Essential (primary) hypertension: Secondary | ICD-10-CM | POA: Diagnosis not present

## 2023-09-26 DIAGNOSIS — J208 Acute bronchitis due to other specified organisms: Secondary | ICD-10-CM | POA: Diagnosis not present

## 2023-09-26 LAB — BASIC METABOLIC PANEL
Anion gap: 12 (ref 5–15)
BUN: 22 mg/dL — ABNORMAL HIGH (ref 6–20)
CO2: 21 mmol/L — ABNORMAL LOW (ref 22–32)
Calcium: 8.5 mg/dL — ABNORMAL LOW (ref 8.9–10.3)
Chloride: 104 mmol/L (ref 98–111)
Creatinine, Ser: 0.95 mg/dL (ref 0.44–1.00)
GFR, Estimated: >60 mL/min (ref >60)
Glucose, Bld: 151 mg/dL — ABNORMAL HIGH (ref 70–99)
Potassium: 3.7 mmol/L (ref 3.5–5.1)
Sodium: 137 mmol/L (ref 135–145)

## 2023-09-26 LAB — LIPID PANEL
Cholesterol: 175 mg/dL (ref 0–200)
HDL: 41 mg/dL (ref >40)
LDL Cholesterol: 118 mg/dL — ABNORMAL HIGH (ref 0–99)
Total CHOL/HDL Ratio: 4.3 ratio
Triglycerides: 79 mg/dL (ref <150)
VLDL: 16 mg/dL (ref 0–40)

## 2023-09-26 LAB — CBC
HCT: 37.9 % (ref 36.0–46.0)
Hemoglobin: 12.3 g/dL (ref 12.0–15.0)
MCH: 28.3 pg (ref 26.0–34.0)
MCHC: 32.5 g/dL (ref 30.0–36.0)
MCV: 87.1 fL (ref 80.0–100.0)
Platelets: 215 10*3/uL (ref 150–400)
RBC: 4.35 MIL/uL (ref 3.87–5.11)
RDW: 14.5 % (ref 11.5–15.5)
WBC: 12.8 10*3/uL — ABNORMAL HIGH (ref 4.0–10.5)
nRBC: 0 % (ref 0.0–0.2)

## 2023-09-26 LAB — PROCALCITONIN: Procalcitonin: <0.1 ng/mL

## 2023-09-26 LAB — HEPARIN LEVEL (UNFRACTIONATED): Heparin Unfractionated: <0.1 [IU]/mL — ABNORMAL LOW (ref 0.30–0.70)

## 2023-09-26 LAB — PROTIME-INR
INR: 1.2 (ref 0.8–1.2)
Prothrombin Time: 15 s (ref 11.4–15.2)

## 2023-09-26 LAB — MRSA NEXT GEN BY PCR, NASAL: MRSA by PCR Next Gen: NOT DETECTED

## 2023-09-26 LAB — GLUCOSE, CAPILLARY: Glucose-Capillary: 140 mg/dL — ABNORMAL HIGH (ref 70–99)

## 2023-09-26 MED ORDER — DIGOXIN 0.25 MG/ML IJ SOLN
0.2500 mg | Freq: Once | INTRAMUSCULAR | Status: AC
  Administered 2023-09-26: 0.25 mg via INTRAVENOUS
  Filled 2023-09-26: qty 2

## 2023-09-26 MED ORDER — BISOPROLOL FUMARATE 5 MG PO TABS
5.0000 mg | ORAL_TABLET | Freq: Two times a day (BID) | ORAL | Status: DC
  Administered 2023-09-26 – 2023-10-02 (×12): 5 mg via ORAL
  Filled 2023-09-26 (×15): qty 1

## 2023-09-26 MED ORDER — SODIUM CHLORIDE 0.9 % IV SOLN
500.0000 mg | Freq: Once | INTRAVENOUS | Status: AC
  Administered 2023-09-26: 500 mg via INTRAVENOUS
  Filled 2023-09-26: qty 5

## 2023-09-26 MED ORDER — HEPARIN BOLUS VIA INFUSION
2500.0000 [IU] | Freq: Once | INTRAVENOUS | Status: AC
  Administered 2023-09-26: 2500 [IU] via INTRAVENOUS
  Filled 2023-09-26: qty 2500

## 2023-09-26 MED ORDER — IPRATROPIUM-ALBUTEROL 0.5-2.5 (3) MG/3ML IN SOLN
3.0000 mL | Freq: Four times a day (QID) | RESPIRATORY_TRACT | Status: DC
  Administered 2023-09-26 – 2023-09-29 (×11): 3 mL via RESPIRATORY_TRACT
  Filled 2023-09-26 (×11): qty 3

## 2023-09-26 MED ORDER — LORAZEPAM 2 MG/ML IJ SOLN
1.0000 mg | INTRAMUSCULAR | Status: DC | PRN
  Administered 2023-09-26: 1 mg via INTRAVENOUS
  Filled 2023-09-26: qty 1

## 2023-09-26 MED ORDER — IOHEXOL 350 MG/ML SOLN
100.0000 mL | Freq: Once | INTRAVENOUS | Status: AC | PRN
Start: 2023-09-26 — End: 2023-09-26
  Administered 2023-09-26: 100 mL via INTRAVENOUS

## 2023-09-26 MED ORDER — CHLORHEXIDINE GLUCONATE CLOTH 2 % EX PADS
6.0000 | MEDICATED_PAD | Freq: Every day | CUTANEOUS | Status: DC
  Administered 2023-09-26 – 2023-10-02 (×5): 6 via TOPICAL

## 2023-09-26 MED ORDER — HEPARIN BOLUS VIA INFUSION
4000.0000 [IU] | Freq: Once | INTRAVENOUS | Status: AC
  Administered 2023-09-26: 4000 [IU] via INTRAVENOUS
  Filled 2023-09-26: qty 4000

## 2023-09-26 MED ORDER — METHYLPREDNISOLONE SODIUM SUCC 125 MG IJ SOLR
125.0000 mg | Freq: Once | INTRAMUSCULAR | Status: AC
  Administered 2023-09-26: 125 mg via INTRAVENOUS
  Filled 2023-09-26: qty 2

## 2023-09-26 MED ORDER — LORAZEPAM 1 MG PO TABS
1.0000 mg | ORAL_TABLET | Freq: Four times a day (QID) | ORAL | Status: DC | PRN
  Administered 2023-09-26 – 2023-10-03 (×8): 1 mg via ORAL
  Filled 2023-09-26 (×8): qty 1

## 2023-09-26 MED ORDER — HEPARIN (PORCINE) 25000 UT/250ML-% IV SOLN
1900.0000 [IU]/h | INTRAVENOUS | Status: DC
  Administered 2023-09-26: 1300 [IU]/h via INTRAVENOUS
  Administered 2023-09-27 – 2023-09-28 (×2): 1550 [IU]/h via INTRAVENOUS
  Administered 2023-09-29: 1900 [IU]/h via INTRAVENOUS
  Administered 2023-09-30 – 2023-10-03 (×5): 2050 [IU]/h via INTRAVENOUS
  Filled 2023-09-26 (×12): qty 250

## 2023-09-26 MED ORDER — SODIUM CHLORIDE 0.9 % IV SOLN
2.0000 g | INTRAVENOUS | Status: AC
  Administered 2023-09-26 – 2023-10-01 (×6): 2 g via INTRAVENOUS
  Filled 2023-09-26 (×6): qty 20

## 2023-09-26 MED ORDER — DEXTROSE 5 % IV SOLN
250.0000 mg | INTRAVENOUS | Status: DC
  Administered 2023-09-27 – 2023-09-28 (×2): 250 mg via INTRAVENOUS
  Filled 2023-09-26 (×2): qty 2.5

## 2023-09-26 MED ORDER — FUROSEMIDE 10 MG/ML IJ SOLN
40.0000 mg | Freq: Once | INTRAMUSCULAR | Status: AC
Start: 2023-09-26 — End: 2023-09-26
  Administered 2023-09-26: 40 mg via INTRAVENOUS

## 2023-09-26 MED ORDER — IPRATROPIUM-ALBUTEROL 0.5-2.5 (3) MG/3ML IN SOLN
3.0000 mL | RESPIRATORY_TRACT | Status: DC | PRN
  Administered 2023-10-02: 3 mL via RESPIRATORY_TRACT

## 2023-09-26 MED ORDER — BISOPROLOL FUMARATE 5 MG PO TABS
5.0000 mg | ORAL_TABLET | Freq: Every day | ORAL | Status: DC
  Filled 2023-09-26: qty 1

## 2023-09-26 NOTE — Telephone Encounter (Signed)
 Pharmacy Patient Advocate Encounter  Insurance verification completed.    The patient is insured through [employer].     Ran test claim for Eliquis 5 mg and the current 30 day co-pay is $585.78 due to deductible.   This test claim was processed through [facility]- copay amounts may vary at other pharmacies due to pharmacy/plan contracts, or as the patient moves through the different stages of their insurance plan.

## 2023-09-26 NOTE — Plan of Care (Signed)
  Problem: Education: Goal: Knowledge of General Education information will improve Description: Including pain rating scale, medication(s)/side effects and non-pharmacologic comfort measures Outcome: Progressing   Problem: Cardiac: Goal: Ability to achieve and maintain adequate cardiopulmonary perfusion will improve Outcome: Progressing   Problem: Activity: Goal: Ability to tolerate increased activity will improve Outcome: Progressing   Problem: Education: Goal: Knowledge of disease or condition will improve Outcome: Progressing Goal: Understanding of medication regimen will improve Outcome: Progressing Goal: Individualized Educational Video(s) Outcome: Progressing

## 2023-09-26 NOTE — Progress Notes (Signed)
 Verbal conversation With [place] NP regarding patients increase work of breathing and HR sustaining high 140s to mid 150s.  Verbal order to give 2mg  of morphine early for increased work of breathing and 40MG s of lasix's ordered. Primary nurse Wilkie Aye notified of plan.

## 2023-09-26 NOTE — Progress Notes (Signed)
 Pt taken to CT on Bipap and pt started having nausea after CT. Pt removed from Bipap and placed on 4L nasal cannula. Pt placed on HHFNC after returning to ICU 11 because of increased WOB. Pt given something for nausea.

## 2023-09-26 NOTE — Progress Notes (Signed)
 PHARMACY - ANTICOAGULATION CONSULT NOTE  Pharmacy Consult for heparin infusion Indication: atrial fibrillation  Allergies  Allergen Reactions   Penicillins Hives    Has patient had a PCN reaction causing immediate rash, facial/tongue/throat swelling, SOB or lightheadedness with hypotension: No Has patient had a PCN reaction causing severe rash involving mucus membranes or skin necrosis: No Has patient had a PCN reaction that required hospitalization: No Has patient had a PCN reaction occurring within the last 10 years: No If all of the above answers are "NO", then may proceed with Cephalosporin use.     Patient Measurements: Height: 5\' 7"  (170.2 cm) Weight: 95.7 kg (210 lb 15.7 oz) IBW/kg (Calculated) : 61.6 Heparin Dosing Weight: 82.6 kg22  Vital Signs: Temp: 98.6 F (37 C) (03/06 0800) Temp Source: Oral (03/06 0800) BP: 103/92 (03/06 1015) Pulse Rate: 60 (03/06 1015)  Labs: Recent Labs    09/25/23 1711 09/26/23 0426  HGB 12.1 12.3  HCT 37.7 37.9  PLT 234 215  LABPROT  --  15.0  INR  --  1.2  CREATININE 1.19* 0.95  TROPONINIHS 16  --     Estimated Creatinine Clearance: 84.1 mL/min (by C-G formula based on SCr of 0.95 mg/dL).   Medical History: Past Medical History:  Diagnosis Date   Hypertension     Medications:  Scheduled:   amLODipine  10 mg Oral Daily   buPROPion  150 mg Oral Daily   busPIRone  5 mg Oral TID   Chlorhexidine Gluconate Cloth  6 each Topical Daily   DULoxetine  40 mg Oral Daily   furosemide  40 mg Intravenous Q12H   rosuvastatin  40 mg Oral Daily    Assessment: 53 y.o. female w/ PMH of essential hypertension and cerebral aneurysm s/p repair, who presented to the emergency room with acute onset of palpitations. She is on no chronic anticoagulation PTA   ` Goal of Therapy:  Heparin level 0.3-0.7 units/ml Monitor platelets by anticoagulation protocol: Yes   Plan:  Give 5000 units bolus x 1 Start heparin infusion at 1300  units/hr Check anti-Xa level in 6 hours and daily while on heparin Continue to monitor H&H and platelets  Lowella Bandy 09/26/2023,10:52 AM

## 2023-09-26 NOTE — Progress Notes (Signed)
 PHARMACY - ANTICOAGULATION CONSULT NOTE  Pharmacy Consult for heparin infusion Indication: atrial fibrillation  Allergies  Allergen Reactions   Penicillins Hives    Has patient had a PCN reaction causing immediate rash, facial/tongue/throat swelling, SOB or lightheadedness with hypotension: No Has patient had a PCN reaction causing severe rash involving mucus membranes or skin necrosis: No Has patient had a PCN reaction that required hospitalization: No Has patient had a PCN reaction occurring within the last 10 years: No If all of the above answers are "NO", then may proceed with Cephalosporin use.    Patient Measurements: Height: 5\' 7"  (170.2 cm) Weight: 95.7 kg (210 lb 15.7 oz) IBW/kg (Calculated) : 61.6 Heparin Dosing Weight: 82.6 kg  Vital Signs: Temp: 97.9 F (36.6 C) (03/06 1700) Temp Source: Oral (03/06 1700) BP: 113/92 (03/06 1815) Pulse Rate: 85 (03/06 1815)  Labs: Recent Labs    09/25/23 1711 09/26/23 0426  HGB 12.1 12.3  HCT 37.7 37.9  PLT 234 215  LABPROT  --  15.0  INR  --  1.2  CREATININE 1.19* 0.95  TROPONINIHS 16  --    Estimated Creatinine Clearance: 84.1 mL/min (by C-G formula based on SCr of 0.95 mg/dL).  Medical History: Past Medical History:  Diagnosis Date   Hypertension    Medications:  Scheduled:   amLODipine  10 mg Oral Daily   bisoprolol  5 mg Oral BID   buPROPion  150 mg Oral Daily   busPIRone  5 mg Oral TID   Chlorhexidine Gluconate Cloth  6 each Topical Daily   DULoxetine  40 mg Oral Daily   furosemide  40 mg Intravenous Q12H   ipratropium-albuterol  3 mL Nebulization Q6H WA   rosuvastatin  40 mg Oral Daily   Assessment: 53 y.o. female w/ PMH of essential hypertension and cerebral aneurysm s/p repair, who presented to the emergency room with acute onset of palpitations. She is on no chronic anticoagulation PTA   ` Goal of Therapy:  Heparin level 0.3-0.7 units/ml Monitor platelets by anticoagulation protocol: Yes   Labs:   0306 1745 HL < 0.10; subtherapeutic on 1300 units/hr   Plan:  HL subtherapeutic  Give 2500 unit bolus x 1  Increase heparin infusion to 1550 units/hr  Check next HL 6 hours after rate adjustment  Continue to monitor H&H and platelets   Littie Deeds, PharmD Pharmacy Resident  09/26/2023 7:53 PM

## 2023-09-26 NOTE — Progress Notes (Signed)
 Patient placed back on bipap after Ativan given. Patient refusing foley placement. Np Ouma made aware. Updated on patient presentation with no new orders. Will continue to monitor.

## 2023-09-26 NOTE — Progress Notes (Signed)
*  PRELIMINARY RESULTS* Echocardiogram 2D Echocardiogram has been performed.  Carolyne Fiscal 09/26/2023, 12:49 PM

## 2023-09-26 NOTE — Progress Notes (Signed)
 Patient arrived from the ED with Bipap in place. Patient seemed to be resting on the bipap. Heart rate is increased to 140s per the monitor. Infusing Amino at 91mcg/hr or 33.84ml/hr. Had to take patient to preformed Stat CT scan. During CT the patient threw up so Bipap was removed by respiratory. Respiratory placed on Heated High flow upon returning to the room. Gave PRN zofran. Informed Webb Silversmith, NP of patient presentation. Also reviewed that she wanted the Amino reduced per orders. She stated to reduce per [facility] orders. Patient is alert and oriented but Anxious. Bed in the lowest position, call bell within reach side rails up X2, monitor in place.

## 2023-09-26 NOTE — ED Notes (Signed)
Report given to step down

## 2023-09-26 NOTE — Hospital Course (Addendum)
 Hospital course / significant events:   HPI: Sandra Hines is a 53 y.o. female with medical history significant for essential hypertension and cerebral aneurysm s/p repair, who presented to the emergency room with acute onset of palpitations 03/05 with a feeling of generalized weakness and malaise over the last couple weeks.   03/05: in ED, Afib RVR w/ rate 170s, CXR mild cardiomegaly, vascular congestion, question sequelae from granulomatous disease. In ED, received Ativan, Lopressor IV push, started Cardizem gtt but no response so started on amiodarone bolus/gtt. Cardiology consulted  03/06: requiring BiPap overnight, on HFNC this morning, starting tx for possible pneumonia/bronchitis today w/ abx steroids nebs, cardiology is adjusting medications for Afib, remains high HR but improved into the afternoon. 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 Converted to sinus.  03/07: on [place] O2 overnight. HR controlled, off dilt this morning, continue amio gtt into tomorrow, cardiology following and anticipate LHC and TEE next week. Holding lasix for now d/t AKI. Back on BiPap this afternoon/evening.  03/08: remaining on BiPap overnight, Cr improved so re-started IV lasix. Cardio to add ntg paste, will try to avoid ntg gtt and art line if able. Diuresing well.  03/09: improved today, off BIPap, good UOP -2L, Cr continues trend down, NSR. Plan to po amiodarone tomorrow.  03/10: po amiodarone started. Repeat limited echo. R/L Cath planned for tomorrow.      Consultants:  Cardiology PCCU   Procedures/Surgeries: none      ASSESSMENT & PLAN:   Atrial fibrillation with rapid ventricular response - converted to sinus rhythm   Cardiology following - [facility] IV amiodarone drip --> po  CHA2DS2-VASc score is 3, plan for anticoagulation eventually likely w/ eliquis, on heparin for now    Severe mitral regurgitation  Question undiagnosed rheumatic valvular  heart disease or endocarditis (no obvious vegetation) maintain sinus rhythm and blood pressure control Anticipate possible TEE / heart cath per cardiology --> LHC/RHC tomorrow   Acute CHF (congestive heart failure) HFpEF in setting of severe MR as above  Diuresis restarted given improvement in Cr - doing well  Cardiology following  Beta blocker w/ bisoprolol statin  Anticipate possible TEE / heart cath per cardiology -  --> LHC/RHC tomorrow   Acute bronchitis Question CAP No formal COPD dx but possible this is contributing given tobacco hx  Lung sounds much improved today ceftriaxone + azithromycin x5 days Duonebs scheduled + prn --> LAMA/LABA to start tomorrow  S/p steroids    Essential hypertension Continue amlodipine 10 mg daily Continue bisoprolol 5 mg bid  Cardiology following - defer changes in meds to their recs   Dyslipidemia statin    Anxiety and depression Continue home Wellbutrin SR, BuSpar, Cymbalta. Benzos prn, will d/c these if there appears to be respiratory depression but pt has tolerated thus far   Class 1 obesity based on BMI: Body mass index is 33.04 kg/m.  Underweight - under 18  overweight - 25 to 29 obese - 30 or more Class 1 obesity: BMI of 30.0 to 34 Class 2 obesity: BMI of 35.0 to 39 Class 3 obesity: BMI of 40.0 to 49 Super Morbid Obesity: BMI 50-59 Super-super Morbid Obesity: BMI 60+ Significantly low or high BMI is associated with higher medical risk.  Weight management advised as adjunct to other disease management and risk reduction treatments    DVT prophylaxis: heparin IV fluids: no continuous IV fluids  Nutrition: cardiac diet  Central lines / invasive  devices: none  Code Status: FULL CODE ACP documentation reviewed:  none on file in [facility]  TOC needs: TBD pend PT/OT eval, expect will need HH and opossibly O2 Barriers to dispo / significant pending items: cardiac cath planned for tomorrow

## 2023-09-26 NOTE — Consult Note (Signed)
 Cardiology Consultation   Patient ID: RAQUELL RICHER MRN: [id number]; DOB: 17-Apr-1971  Admit date: 09/25/2023 Date of Consult: 09/26/2023  PCP:  Ronnald Ramp, MD   [facility] Providers Cardiologist: New  Patient Profile:   Sandra Hines is a 53 y.o. female with a hx of HTN, cerebral aneurysm s/p repair who is being seen 09/26/2023 for the evaluation of New onset Afib at the request of Dr. Lyn Hollingshead.  History of Present Illness:   Sandra Hines has no prior cardiac history. She smokes 2 pack every 3 days. She drinks alcohol, 3 drinks weekly. No drug history. 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   The patient presented to the ER 09/25/23 with tachycardia, cough, orthopnea, and SOB. She went sent form her PCPs office for new onset Afib with RVR and hypertensive emergency. She reported not feeling well the last 2 weeks. She was having URI symptoms that were not improving with severe coughing, SOB, orthopnea. She went to urgent care and was told it was tonsillitis and given medications. She was waking up with shortness of breath and couldn't go back to sleep. She noted racing HR that worsened over the last week. She went to see PCP who referred her to the ER.   In the ER BP 111/97, pulse 176bpm, RR 22, afebrile, 100% O2. Labs showed WBC 13.9, Mag 2.2, Scr 1.19, BUN 23, BG 132, BNP 700, calcium 8.7. HS trop 16. CXR showed mild cardiomegaly, mild central pulmonary vascular congestion. Chest CTA showed no PE, arterial HTN, 4.3cm ascending aortic aneurysm, interstitial edema, possible PNA.The patient was given a dose of IV dilt without improvement. Cardiology was consulted and she was given amio bolus+ drip. She was admitted to ICU initially requiring Bipap.  Past Medical History:  Diagnosis Date   Hypertension     History reviewed. No pertinent surgical history.   Home Medications:  Prior to Admission medications   Medication Sig Start Date End Date Taking? Authorizing  Provider  amLODipine (NORVASC) 10 MG tablet Take 1 tablet (10 mg total) by mouth daily. 09/05/23  Yes Simmons-Robinson, Tawanna Cooler, MD  benzonatate (TESSALON) 100 MG capsule Take 1 capsule (100 mg total) by mouth 2 (two) times daily as needed for cough. 09/05/23  Yes Simmons-Robinson, Makiera, MD  DULoxetine (CYMBALTA) 20 MG capsule Take 2 capsules (40 mg total) by mouth daily. 08/15/23  Yes Simmons-Robinson, Makiera, MD  rosuvastatin (CRESTOR) 40 MG tablet Take 1 tablet (40 mg total) by mouth daily. 07/19/23  Yes Jacky Kindle, FNP  buPROPion (WELLBUTRIN SR) 150 MG 12 hr tablet 1 tablet daily for 3 days, then 1 tablet twice daily. Stop smoking 14 days after starting medication Patient not taking: Reported on 09/26/2023 07/19/23 11/16/23  Jacky Kindle, FNP  busPIRone (BUSPAR) 5 MG tablet Take 1 tablet (5 mg total) by mouth 3 (three) times daily. 07/19/23   Jacky Kindle, FNP  losartan-hydrochlorothiazide (HYZAAR) 100-12.5 MG tablet Take 1 tablet by mouth daily. 07/19/23   Jacky Kindle, FNP  traZODone (DESYREL) 50 MG tablet Take 0.5-1 tablets (25-50 mg total) by mouth at bedtime as needed for sleep. 07/19/23   Jacky Kindle, FNP    Inpatient Medications: Scheduled Meds:  amLODipine  10 mg Oral Daily   apixaban  5 mg Oral BID   buPROPion  150 mg Oral Daily   busPIRone  5 mg Oral TID   Chlorhexidine Gluconate Cloth  6 each Topical Daily   DULoxetine  40 mg Oral Daily  furosemide  40 mg Intravenous Q12H   rosuvastatin  40 mg Oral Daily   Continuous Infusions:  amiodarone 30 mg/hr (09/26/23 0600)   diltiazem (CARDIZEM) infusion 5 mg/hr (09/26/23 0610)   PRN Meds: acetaminophen, benzonatate, LORazepam, magnesium hydroxide, morphine injection, ondansetron (ZOFRAN) IV, traZODone  Allergies:    Allergies  Allergen Reactions   Penicillins Hives    Has patient had a PCN reaction causing immediate rash, facial/tongue/throat swelling, SOB or lightheadedness with hypotension: No Has patient had a  PCN reaction causing severe rash involving mucus membranes or skin necrosis: No Has patient had a PCN reaction that required hospitalization: No Has patient had a PCN reaction occurring within the last 10 years: No If all of the above answers are "NO", then may proceed with Cephalosporin use.     Social History:   Social History   Socioeconomic History   Marital status: Single    Spouse name: Not on file   Number of children: Not on file   Years of education: Not on file   Highest education level: Not on file  Occupational History   Not on file  Tobacco Use   Smoking status: Every Day    Current packs/day: 1.00    Types: Cigarettes   Smokeless tobacco: Never  Substance and Sexual Activity   Alcohol use: No   Drug use: No   Sexual activity: Yes    Birth control/protection: None, Condom  Other Topics Concern   Not on file  Social History Narrative   Not on file   Social Drivers of Health   Financial Resource Strain: Not on file  Food Insecurity: No Food Insecurity (09/26/2023)   Hunger Vital Sign    Worried About Running Out of Food in the Last Year: Never true    Ran Out of Food in the Last Year: Never true  Transportation Needs: No Transportation Needs (09/26/2023)   PRAPARE - Administrator, Civil Service (Medical): No    Lack of Transportation (Non-Medical): No  Physical Activity: Not on file  Stress: Not on file  Social Connections: Not on file  Intimate Partner Violence: Not At Risk (09/26/2023)   Humiliation, Afraid, Rape, and Kick questionnaire    Fear of Current or Ex-Partner: No    Emotionally Abused: No    Physically Abused: No    Sexually Abused: No    Family History:   History reviewed. No pertinent family history.   ROS:  Please see the history of present illness.   All other ROS reviewed and negative.     Physical Exam/Data:   Vitals:   09/26/23 0615 09/26/23 0630 09/26/23 0645 09/26/23 0700  BP: (!) 120/105 (!) 115/98 (!) 123/96  120/85  Pulse: (!) 134 (!) 58 68 (!) 35  Resp: (!) 30 (!) 22 19 (!) 21  Temp:      TempSrc:      SpO2: 97% 97% 96% 99%  Weight:      Height:        Intake/Output Summary (Last 24 hours) at 09/26/2023 0736 Last data filed at 09/26/2023 0600 Gross per 24 hour  Intake 1357.58 ml  Output 800 ml  Net 557.58 ml      09/26/2023    1:37 AM 09/25/2023    5:03 PM 09/25/2023    4:04 PM  Last 3 Weights  Weight (lbs) 210 lb 15.7 oz 209 lb 210 lb 4.8 oz  Weight (kg) 95.7 kg 94.802 kg 95.391 kg  Body mass index is 33.04 kg/m.  General:  Well nourished, well developed, in no acute distress HEENT: normal Neck: no JVD Vascular: No carotid bruits; Distal pulses 2+ bilaterally Cardiac:  normal S1, S2; Irreg Irreg, tachy; no murmur  Lungs: + wheezing Abd: soft, nontender, no hepatomegaly  Ext: no edema Musculoskeletal:  No deformities, BUE and BLE strength normal and equal Skin: warm and dry  Neuro:  CNs 2-12 intact, no focal abnormalities noted Psych:  Normal affect   EKG:  The EKG was personally reviewed and demonstrates:  SVT 184bpm Telemetry:  Telemetry was personally reviewed and demonstrates:  Afib HR 140s  Relevant CV Studies:  Echo ordered  Laboratory Data:  High Sensitivity Troponin:   Recent Labs  Lab 09/25/23 1711  TROPONINIHS 16     Chemistry Recent Labs  Lab 09/25/23 1711 09/26/23 0426  NA 136 137  K 3.7 3.7  CL 104 104  CO2 22 21*  GLUCOSE 132* 151*  BUN 23* 22*  CREATININE 1.19* 0.95  CALCIUM 8.7* 8.5*  MG 2.2  --   GFRNONAA 56* >60  ANIONGAP 10 12    No results for input(s): "PROT", "ALBUMIN", "AST", "ALT", "ALKPHOS", "BILITOT" in the last 168 hours. Lipids  Recent Labs  Lab 09/26/23 0426  CHOL 175  TRIG 79  HDL 41  LDLCALC 118*  CHOLHDL 4.3    Hematology Recent Labs  Lab 09/25/23 1711 09/26/23 0426  WBC 13.9* 12.8*  RBC 4.25 4.35  HGB 12.1 12.3  HCT 37.7 37.9  MCV 88.7 87.1  MCH 28.5 28.3  MCHC 32.1 32.5  RDW 14.6 14.5  PLT 234 215    Thyroid  Recent Labs  Lab 09/25/23 1711  TSH 1.091  FREET4 1.16*    BNP Recent Labs  Lab 09/25/23 1711  BNP 700.0*    DDimer No results for input(s): "DDIMER" in the last 168 hours.   Radiology/Studies:  CT Angio Chest Pulmonary Embolism (PE) W or WO Contrast Result Date: 09/26/2023 CLINICAL DATA:  New onset atrial fib with RVR, not feeling well the past few days, with shortness of breath. EXAM: CT ANGIOGRAPHY CHEST WITH CONTRAST TECHNIQUE: Multidetector CT imaging of the chest was performed using the standard protocol during bolus administration of intravenous contrast. Multiplanar CT image reconstructions and MIPs were obtained to evaluate the vascular anatomy. RADIATION DOSE REDUCTION: This exam was performed according to the departmental dose-optimization program which includes automated exposure control, adjustment of the mA and/or kV according to patient size and/or use of iterative reconstruction technique. CONTRAST:  OMNIPAQUE IOHEXOL 350 MG/ML SOLN COMPARISON:  Portable chest yesterday, chest radiograph 08/23/2015, and chest CT with contrast both 08/23/2015. FINDINGS: Cardiovascular: Since 2017, increased cardiomegaly with left chamber predominance. There is an enlarged pulmonary trunk measuring 3.6 cm indicating arterial hypertension, previously was 2.4 cm. Also noted is reflux of contrast into the IVC and hepatic veins which may suggest right heart dysfunction or tricuspid regurgitation. There is no increased RV/LV ratio and no embolic arterial filling defect is seen. Central pulmonary veins are prominent. There is mild aortic atherosclerosis. Aortic opacification is insufficient to assess the lumen. The great vessels branch normally. Slight aneurysmal prominence is seen in the mid ascending segment which is 4.3 cm on 7:49 burden the aortic root measures 4 cm at the sinuses of Valsalva on 7:52. There is no substantial pericardial effusion. There are no visible coronary  calcifications. Mediastinum/Nodes: Multiple calcified bihilar and mediastinal lymph nodes are again shown. There is a 1.7 cm  hypodense nodule posteriorly in the right lobe of the thyroid gland. This was not seen previously. Nonemergent follow-up ultrasound is recommended. Axillary spaces are clear. No noncalcified adenopathy is seen in the chest. There are retained secretions versus aspirate in the thoracic trachea. The main bronchi are clear.  The thoracic esophagus is unremarkable. Lungs/Pleura: There is interlobular septal thickening with a basal gradient in the lungs consistent with interstitial edema, moderate. Findings likely due to CHF or fluid overload. There is patchy haziness in the posterior lower lobes which most likely ground-glass edema. Diffuse bronchial thickening is seen and could be congestive, due to bronchitis or reactive airway disease. There are scattered calcified granulomas in both lungs. There is a stable 8 x 7 mm left lower lobe lateral basal nodule on 5:121, stable 4 mm left lower lobe pleural-based nodule laterally on 5:106. Bones are consistent with benign nodules given the length of stability. There is patchy dense peripheral airspace disease in the right upper lobe, a small amount in the right lower lobe perihilar area. This could be due to asymmetric airspace edema or superimposed pneumonia. Upper Abdomen: There are calcified splenic granulomas, occasional calcified hepatic granulomas. No calcified gallstones are seen. There is pericholecystic edema, in this case probably due to congestive edema if there are no localizing symptoms. If there are localizing symptoms, ultrasound may be helpful for further study. No other significant upper abdominal findings. Abdominal aortic atherosclerosis. Musculoskeletal: Degenerative change and mild kyphosis thoracic spine. No acute or other significant osseous findings. No mass in the visualized chest wall. Review of the MIP images confirms the  above findings. IMPRESSION: 1. No evidence of arterial embolus. 2. Increased cardiomegaly with left chamber predominance since 2017. 3. Enlarged pulmonary trunk 3.6 cm indicating arterial hypertension. 4. Reflux of contrast into the IVC and hepatic veins which may suggest right heart dysfunction or tricuspid regurgitation. 5. 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 Recommend annual imaging followup by CTA or MRA. This recommendation follows 2010 ACCF/AHA/AATS/ACR/ASA/SCA/SCAI/SIR/STS/ SVM Guidelines for the Diagnosis and Management of Patients with Thoracic Aortic Disease. Circulation. 2010; 121: Q595-G387. Aortic aneurysm NOS (ICD10-I71.9). 6. Aortic atherosclerosis. 7. Interstitial edema with a basal gradient in the lungs, with patchy haziness in the posterior lower lobes most likely ground-glass edema. Findings consistent with CHF or fluid overload. 8. Diffuse bronchial thickening which could be congestive, due to bronchitis or reactive airway disease. 9. Patchy dense peripheral airspace disease in the right upper lobe, a small amount in the right lower lobe perihilar area. This could be due to asymmetric airspace edema or superimposed pneumonia. 10. Pericholecystic edema, probably due to congestive edema if there are no localizing symptoms. If there are localizing symptoms, ultrasound may be helpful for further study. 11. 1.7 cm hypodense nodule posteriorly in the right lobe of the thyroid gland. Nonemergent follow-up ultrasound is recommended. 12. Stable left lower lobe nodules. 13. Retained secretions versus aspirate in the thoracic trachea. Aortic Atherosclerosis (ICD10-I70.0). Electronically Signed   By: Almira Bar M.D.   On: 09/26/2023 02:46   DG Chest Portable 1 View Result Date: 09/25/2023 CLINICAL DATA:  Cough and atrial fibrillation. EXAM: PORTABLE CHEST 1 VIEW COMPARISON:  August 23, 2015 FINDINGS: The cardiac silhouette is mildly enlarged. Mild prominence of the central pulmonary vasculature is  seen. Ill-defined subcentimeter calcified pulmonary nodules are seen scattered throughout both lungs. No focal consolidation, pleural effusion or pneumothorax is identified. The visualized skeletal structures are unremarkable. IMPRESSION: 1. Mild cardiomegaly with mild central pulmonary vascular congestion. 2. Findings likely  consistent with sequelae associated with prior granulomatous disease. Electronically Signed   By: Aram Candela M.D.   On: 09/25/2023 20:20     Assessment and Plan:   New onset Afib - presented from PCPs office for New onset afib with HR 180s in the setting of acute bronchitis for 2 weeks, also found to have acute CHF - given IV lopressor 5mg  IV, IV Cardizem drip and IL bolus IV NS without improvement - spoke to cardiology who recommended IV amiodarone bolus and drip - still in afib with HR 140s - TSH wnl - Keep K>4 and Mag>2 - CHADSVASC (CHF, HTN, female, PAD) at least 4 on IV heparin - started on bisoprolol 5mg  daily - continue IV amiodarone>increase to 60mg /hr - continue IV dilt 15mg /hr - suspect Afib rates exacerbated by lung issues. If she does not self convert, may need TEE/DCCV  Acute CHF - reported DOE, coughing and orthopnea - BNP 700 and Chest CTA showed CHF - IV lasix 40mg  BID - Net -1.9L - echo ordered - kidney function stable - continue with diuresis  Acute bronchitis - presented with SOB and cough x 2 weeks - Chest CTA showed no PE, CHF and possible PNA - per IM - may need abx  HTN - PTA amlodipine 10mg  daily, losartan-hydrochlorothiazide 100-12.5mg  daily - started on bisoprolol 5mg  daily - amlodipine 10mg  daily - Bps good  HLD - PTA Crestor 40mg  daily - LDL 118, HDL 41, TG 79, total chol 119  For questions or updates, please contact [facility] Please consult [email or web address] for contact info under    Signed, Sandra Hines David Stall, PA-C  09/26/2023 7:36 AM

## 2023-09-26 NOTE — Progress Notes (Signed)
 PROGRESS NOTE    Sandra Hines   ZOX:[id number] DOB: [birth year]  DOA: 09/25/2023 Date of Service: 09/26/23 which is hospital day 1  PCP: Ronnald Ramp, MD    Hospital course / significant events:   HPI: Sandra Hines is a 53 y.o. female with medical history significant for essential hypertension and cerebral aneurysm s/p repair, who presented to the emergency room with acute onset of palpitations 03/05 with a feeling of generalized weakness and malaise over the last couple weeks.   03/05: in ED, Afib RVR w/ rate 170s, CXR mild cardiomegaly, vascular congestion, question sequelae from granulomatous disease. In ED, received Ativan, Lopressor IV push, started Cardizem gtt but no response so started on amiodarone bolus/gtt. Cardiology consulted  03/06: requiring BiPap overnight, on HFNC this morning, starting tx for possible pneumonia/bronchitis today w/ abx steroids nebs, cardiology is adjusting medications for Afib, remains high HR      Consultants:  Cardiology   Procedures/Surgeries: none      ASSESSMENT & PLAN:   Atrial fibrillation with rapid ventricular response  Cardiology following - [facility] IV amiodarone drip  IV Cardizem drip. Bisoprolol po started today CHA2DS2-VASc score is 3, plan for anticoagulation. Eliquis  Echocardiogram    Acute CHF (congestive heart failure)  suspect diastolic etiology. Diuresis IV lasix Echocardiogram Cardiology following    Acute bronchitis Question CAP No formal COPD dx but possible this is contributing given tobacco hx  Treating for CAP w/ ceftriaxone + azithromycin Duonebs scheduled + prn  Steroids given hypoxia   Dyslipidemia statin    Anxiety and depression Continue home Wellbutrin SR, BuSpar, Cymbalta. Benzos prn, will d/c these if there appears to be respiratory depression but pt has tolerated thus far    Essential hypertension Continue home amlodipine for now Expect will depend on rate control / GDMT based  on echo results     Class 1 obesity based on BMI: Body mass index is 33.04 kg/m.  Underweight - under 18  overweight - 25 to 29 obese - 30 or more Class 1 obesity: BMI of 30.0 to 34 Class 2 obesity: BMI of 35.0 to 39 Class 3 obesity: BMI of 40.0 to 49 Super Morbid Obesity: BMI 50-59 Super-super Morbid Obesity: BMI 60+ Significantly low or high BMI is associated with higher medical risk.  Weight management advised as adjunct to other disease management and risk reduction treatments    DVT prophylaxis: Eliquis IV fluids: no continuous IV fluids  Nutrition: cardiac diet  Central lines / invasive devices: none  Code Status: FULL CODE ACP documentation reviewed:  none on file in [facility]  TOC needs: TBD Barriers to dispo / significant pending items: cardiac stabilization and workup, expect medical stability in another 2-3 days at least              Subjective / Brief ROS:  Patient reports SOB, mild headache, anxiety Denies CP Denies new weakness.  Tolerating diet.  Reports no concerns w/ urination/defecation.   Family Communication: family at bedside on rounds     Objective Findings:  Vitals:   09/26/23 1030 09/26/23 1045 09/26/23 1100 09/26/23 1415  BP: (!) 134/109  (!) 143/113 (!) 121/106  Pulse: (!) 110 98 62 (!) 32  Resp: 19 (!) 21 20 (!) 21  Temp:      TempSrc:      SpO2: 98% 98% 97% 95%  Weight:      Height:        Intake/Output Summary (Last 24 hours) at 09/26/2023  1439 Last data filed at 09/26/2023 1100 Gross per 24 hour  Intake 1498.12 ml  Output 1500 ml  Net -1.88 ml   Filed Weights   09/25/23 1703 09/26/23 0137  Weight: 94.8 kg 95.7 kg    Examination:  Physical Exam Constitutional:      General: She is not in acute distress. Cardiovascular:     Rate and Rhythm: Tachycardia present. Rhythm irregular.  Pulmonary:     Effort: No respiratory distress.     Breath sounds: Rhonchi (mild, scattred coarse breath sounds) and rales present.   Abdominal:     General: There is no distension.  Musculoskeletal:     Right lower leg: No edema.     Left lower leg: No edema.  Neurological:     General: No focal deficit present.     Mental Status: She is alert.  Psychiatric:        Mood and Affect: Affect normal. Mood is anxious.          Scheduled Medications:   amLODipine  10 mg Oral Daily   bisoprolol  5 mg Oral BID   buPROPion  150 mg Oral Daily   busPIRone  5 mg Oral TID   Chlorhexidine Gluconate Cloth  6 each Topical Daily   DULoxetine  40 mg Oral Daily   furosemide  40 mg Intravenous Q12H   ipratropium-albuterol  3 mL Nebulization Q6H WA   rosuvastatin  40 mg Oral Daily    Continuous Infusions:  amiodarone 60 mg/hr (09/26/23 1433)   [START ON 09/27/2023] azithromycin     azithromycin     cefTRIAXone (ROCEPHIN)  IV     diltiazem (CARDIZEM) infusion 15 mg/hr (09/26/23 1100)   heparin 1,300 Units/hr (09/26/23 1147)    PRN Medications:  acetaminophen, benzonatate, ipratropium-albuterol, LORazepam, magnesium hydroxide, morphine injection, ondansetron (ZOFRAN) IV, traZODone  Antimicrobials from admission:  Anti-infectives (From admission, onward)    Start     Dose/Rate Route Frequency Ordered Stop   09/27/23 1200  azithromycin (ZITHROMAX) 250 mg in dextrose 5 % 125 mL IVPB        250 mg 127.5 mL/hr over 60 Minutes Intravenous Every 24 hours 09/26/23 1123 10/01/23 1159   09/26/23 1230  cefTRIAXone (ROCEPHIN) 2 g in sodium chloride 0.9 % 100 mL IVPB        2 g 200 mL/hr over 30 Minutes Intravenous Every 24 hours 09/26/23 1123     09/26/23 1230  azithromycin (ZITHROMAX) 500 mg in sodium chloride 0.9 % 250 mL IVPB        500 mg 250 mL/hr over 60 Minutes Intravenous  Once 09/26/23 1123             Data Reviewed:  I have personally reviewed the following...  CBC: Recent Labs  Lab 09/25/23 1711 09/26/23 0426  WBC 13.9* 12.8*  NEUTROABS 7.2  --   HGB 12.1 12.3  HCT 37.7 37.9  MCV 88.7 87.1  PLT  234 215   Basic Metabolic Panel: Recent Labs  Lab 09/25/23 1711 09/26/23 0426  NA 136 137  K 3.7 3.7  CL 104 104  CO2 22 21*  GLUCOSE 132* 151*  BUN 23* 22*  CREATININE 1.19* 0.95  CALCIUM 8.7* 8.5*  MG 2.2  --    GFR: Estimated Creatinine Clearance: 84.1 mL/min (by C-G formula based on SCr of 0.95 mg/dL). Liver Function Tests: No results for input(s): "AST", "ALT", "ALKPHOS", "BILITOT", "PROT", "ALBUMIN" in the last 168 hours. No results for input(s): "  LIPASE", "AMYLASE" in the last 168 hours. No results for input(s): "AMMONIA" in the last 168 hours. Coagulation Profile: Recent Labs  Lab 09/26/23 0426  INR 1.2   Cardiac Enzymes: No results for input(s): "CKTOTAL", "CKMB", "CKMBINDEX", "TROPONINI" in the last 168 hours. BNP (last 3 results) No results for input(s): "PROBNP" in the last 8760 hours. HbA1C: No results for input(s): "HGBA1C" in the last 72 hours. CBG: Recent Labs  Lab 09/26/23 0131  GLUCAP 140*   Lipid Profile: Recent Labs    09/26/23 0426  CHOL 175  HDL 41  LDLCALC 118*  TRIG 79  CHOLHDL 4.3   Thyroid Function Tests: Recent Labs    09/25/23 1711  TSH 1.091  FREET4 1.16*   Anemia Panel: No results for input(s): "VITAMINB12", "FOLATE", "FERRITIN", "TIBC", "IRON", "RETICCTPCT" in the last 72 hours. Most Recent Urinalysis On File:     Component Value Date/Time   COLORURINE YELLOW (A) 11/26/2020 0041   APPEARANCEUR CLEAR (A) 11/26/2020 0041   APPEARANCEUR Cloudy 08/15/2014 0519   LABSPEC 1.026 11/26/2020 0041   LABSPEC 1.017 08/15/2014 0519   PHURINE 5.0 11/26/2020 0041   GLUCOSEU NEGATIVE 11/26/2020 0041   GLUCOSEU Negative 08/15/2014 0519   HGBUR NEGATIVE 11/26/2020 0041   BILIRUBINUR NEGATIVE 11/26/2020 0041   BILIRUBINUR Negative 08/15/2014 0519   KETONESUR NEGATIVE 11/26/2020 0041   PROTEINUR NEGATIVE 11/26/2020 0041   NITRITE NEGATIVE 11/26/2020 0041   LEUKOCYTESUR NEGATIVE 11/26/2020 0041   LEUKOCYTESUR Negative 08/15/2014  0519   Sepsis Labs: @LABRCNTIP (procalcitonin:4,lacticidven:4) Microbiology: Recent Results (from the past 240 hours)  Resp panel by RT-PCR (RSV, Flu A&B, Covid) Anterior Nasal Swab     Status: None   Collection Time: 09/25/23  5:22 PM   Specimen: Anterior Nasal Swab  Result Value Ref Range Status   SARS Coronavirus 2 by RT PCR NEGATIVE NEGATIVE Final    Comment: (NOTE) SARS-CoV-2 target nucleic acids are NOT DETECTED.  The SARS-CoV-2 RNA is generally detectable in upper respiratory specimens during the acute phase of infection. The lowest concentration of SARS-CoV-2 viral copies this assay can detect is 138 copies/mL. A negative result does not preclude SARS-Cov-2 infection and should not be used as the sole basis for treatment or other patient management decisions. A negative result may occur with  improper specimen collection/handling, submission of specimen other than nasopharyngeal swab, presence of viral mutation(s) within the areas targeted by this assay, and inadequate number of viral copies(<138 copies/mL). A negative result must be combined with clinical observations, patient history, and epidemiological information. The expected result is Negative.  Fact Sheet for Patients:  BloggerCourse.com  Fact Sheet for Healthcare Providers:  SeriousBroker.it  This test is no t yet approved or cleared by the [place] FDA and  has been authorized for detection and/or diagnosis of SARS-CoV-2 by FDA under an Emergency Use Authorization (EUA). This EUA will remain  in effect (meaning this test can be used) for the duration of the COVID-19 declaration under Section 564(b)(1) of the Act, 21 U.S.C.section 360bbb-3(b)(1), unless the authorization is terminated  or revoked sooner.       Influenza A by PCR NEGATIVE NEGATIVE Final   Influenza B by PCR NEGATIVE NEGATIVE Final    Comment: (NOTE) The Xpert Xpress SARS-CoV-2/FLU/RSV  plus assay is intended as an aid in the diagnosis of influenza from Nasopharyngeal swab specimens and should not be used as a sole basis for treatment. Nasal washings and aspirates are unacceptable for Xpert Xpress SARS-CoV-2/FLU/RSV testing.  Fact Sheet for Patients: BloggerCourse.com  Fact Sheet for Healthcare Providers: SeriousBroker.it  This test is not yet approved or cleared by the [place] FDA and has been authorized for detection and/or diagnosis of SARS-CoV-2 by FDA under an Emergency Use Authorization (EUA). This EUA will remain in effect (meaning this test can be used) for the duration of the COVID-19 declaration under Section 564(b)(1) of the Act, 21 U.S.C. section 360bbb-3(b)(1), unless the authorization is terminated or revoked.     Resp Syncytial Virus by PCR NEGATIVE NEGATIVE Final    Comment: (NOTE) Fact Sheet for Patients: BloggerCourse.com  Fact Sheet for Healthcare Providers: SeriousBroker.it  This test is not yet approved or cleared by the [place] FDA and has been authorized for detection and/or diagnosis of SARS-CoV-2 by FDA under an Emergency Use Authorization (EUA). This EUA will remain in effect (meaning this test can be used) for the duration of the COVID-19 declaration under Section 564(b)(1) of the Act, 21 U.S.C. section 360bbb-3(b)(1), unless the authorization is terminated or revoked.  Performed at [facility], [address] [place], [place] [address]   MRSA Next Gen by PCR, Nasal     Status: None   Collection Time: 09/26/23  1:39 AM   Specimen: Nasal Mucosa; Nasal Swab  Result Value Ref Range Status   MRSA by PCR Next Gen NOT DETECTED NOT DETECTED Final    Comment: (NOTE) The GeneXpert MRSA Assay (FDA approved for NASAL specimens only), is one component of a comprehensive MRSA colonization surveillance program. It is  not intended to diagnose MRSA infection nor to guide or monitor treatment for MRSA infections. Test performance is not FDA approved in patients less than 59 years old. Performed at [facility], [address] [place], [place] [address]   Culture, blood (Routine X 2) w Reflex to ID Panel     Status: None (Preliminary result)   Collection Time: 09/26/23  3:31 AM   Specimen: BLOOD  Result Value Ref Range Status   Specimen Description BLOOD BLOOD LEFT HAND  Final   Special Requests   Final    BOTTLES DRAWN AEROBIC AND ANAEROBIC Blood Culture adequate volume   Culture   Final    NO GROWTH < 12 HOURS Performed at [facility], [address] [place], [place] [address]    Report Status PENDING  Incomplete  Culture, blood (Routine X 2) w Reflex to ID Panel     Status: None (Preliminary result)   Collection Time: 09/26/23  3:40 AM   Specimen: BLOOD  Result Value Ref Range Status   Specimen Description BLOOD BLOOD RIGHT HAND  Final   Special Requests   Final    BOTTLES DRAWN AEROBIC AND ANAEROBIC Blood Culture adequate volume   Culture   Final    NO GROWTH < 12 HOURS Performed at [facility], [address] [place], [place] [address]    Report Status PENDING  Incomplete      Radiology Studies last 3 days: CT Angio Chest Pulmonary Embolism (PE) W or WO Contrast Result Date: 09/26/2023 CLINICAL DATA:  New onset atrial fib with RVR, not feeling well the past few days, with shortness of breath. EXAM: CT ANGIOGRAPHY CHEST WITH CONTRAST TECHNIQUE: Multidetector CT imaging of the chest was performed using the standard protocol during bolus administration of intravenous contrast. Multiplanar CT image reconstructions and MIPs were obtained to evaluate the vascular anatomy. RADIATION DOSE REDUCTION: This exam was performed according to the departmental dose-optimization program which includes automated exposure control, adjustment  of the mA and/or kV according to  patient size and/or use of iterative reconstruction technique. CONTRAST:  OMNIPAQUE IOHEXOL 350 MG/ML SOLN COMPARISON:  Portable chest yesterday, chest radiograph 08/23/2015, and chest CT with contrast both 08/23/2015. FINDINGS: Cardiovascular: Since 2017, increased cardiomegaly with left chamber predominance. There is an enlarged pulmonary trunk measuring 3.6 cm indicating arterial hypertension, previously was 2.4 cm. Also noted is reflux of contrast into the IVC and hepatic veins which may suggest right heart dysfunction or tricuspid regurgitation. There is no increased RV/LV ratio and no embolic arterial filling defect is seen. Central pulmonary veins are prominent. There is mild aortic atherosclerosis. Aortic opacification is insufficient to assess the lumen. The great vessels branch normally. Slight aneurysmal prominence is seen in the mid ascending segment which is 4.3 cm on 7:49 burden the aortic root measures 4 cm at the sinuses of Valsalva on 7:52. There is no substantial pericardial effusion. There are no visible coronary calcifications. Mediastinum/Nodes: Multiple calcified bihilar and mediastinal lymph nodes are again shown. There is a 1.7 cm hypodense nodule posteriorly in the right lobe of the thyroid gland. This was not seen previously. Nonemergent follow-up ultrasound is recommended. Axillary spaces are clear. No noncalcified adenopathy is seen in the chest. There are retained secretions versus aspirate in the thoracic trachea. The main bronchi are clear.  The thoracic esophagus is unremarkable. Lungs/Pleura: There is interlobular septal thickening with a basal gradient in the lungs consistent with interstitial edema, moderate. Findings likely due to CHF or fluid overload. There is patchy haziness in the posterior lower lobes which most likely ground-glass edema. Diffuse bronchial thickening is seen and could be congestive, due to bronchitis or reactive airway disease. There are scattered  calcified granulomas in both lungs. There is a stable 8 x 7 mm left lower lobe lateral basal nodule on 5:121, stable 4 mm left lower lobe pleural-based nodule laterally on 5:106. Bones are consistent with benign nodules given the length of stability. There is patchy dense peripheral airspace disease in the right upper lobe, a small amount in the right lower lobe perihilar area. This could be due to asymmetric airspace edema or superimposed pneumonia. Upper Abdomen: There are calcified splenic granulomas, occasional calcified hepatic granulomas. No calcified gallstones are seen. There is pericholecystic edema, in this case probably due to congestive edema if there are no localizing symptoms. If there are localizing symptoms, ultrasound may be helpful for further study. No other significant upper abdominal findings. Abdominal aortic atherosclerosis. Musculoskeletal: Degenerative change and mild kyphosis thoracic spine. No acute or other significant osseous findings. No mass in the visualized chest wall. Review of the MIP images confirms the above findings. IMPRESSION: 1. No evidence of arterial embolus. 2. Increased cardiomegaly with left chamber predominance since 2017. 3. Enlarged pulmonary trunk 3.6 cm indicating arterial hypertension. 4. Reflux of contrast into the IVC and hepatic veins which may suggest right heart dysfunction or tricuspid regurgitation. 5. 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 Recommend annual imaging followup by CTA or MRA. This recommendation follows 2010 ACCF/AHA/AATS/ACR/ASA/SCA/SCAI/SIR/STS/ SVM Guidelines for the Diagnosis and Management of Patients with Thoracic Aortic Disease. Circulation. 2010; 121: Z610-R604. Aortic aneurysm NOS (ICD10-I71.9). 6. Aortic atherosclerosis. 7. Interstitial edema with a basal gradient in the lungs, with patchy haziness in the posterior lower lobes most likely ground-glass edema. Findings consistent with CHF or fluid overload. 8. Diffuse bronchial thickening  which could be congestive, due to bronchitis or reactive airway disease. 9. Patchy dense peripheral airspace disease in the right upper  lobe, a small amount in the right lower lobe perihilar area. This could be due to asymmetric airspace edema or superimposed pneumonia. 10. Pericholecystic edema, probably due to congestive edema if there are no localizing symptoms. If there are localizing symptoms, ultrasound may be helpful for further study. 11. 1.7 cm hypodense nodule posteriorly in the right lobe of the thyroid gland. Nonemergent follow-up ultrasound is recommended. 12. Stable left lower lobe nodules. 13. Retained secretions versus aspirate in the thoracic trachea. Aortic Atherosclerosis (ICD10-I70.0). Electronically Signed   By: Almira Bar M.D.   On: 09/26/2023 02:46   DG Chest Portable 1 View Result Date: 09/25/2023 CLINICAL DATA:  Cough and atrial fibrillation. EXAM: PORTABLE CHEST 1 VIEW COMPARISON:  August 23, 2015 FINDINGS: The cardiac silhouette is mildly enlarged. Mild prominence of the central pulmonary vasculature is seen. Ill-defined subcentimeter calcified pulmonary nodules are seen scattered throughout both lungs. No focal consolidation, pleural effusion or pneumothorax is identified. The visualized skeletal structures are unremarkable. IMPRESSION: 1. Mild cardiomegaly with mild central pulmonary vascular congestion. 2. Findings likely consistent with sequelae associated with prior granulomatous disease. Electronically Signed   By: Aram Candela M.D.   On: 09/25/2023 20:20       Time spent: 50 min     Sunnie Nielsen, DO [facility] 09/26/2023, 2:39 PM    Dictation software may have been used to generate the above note. Typos may occur and escape review in typed/dictated notes. Please contact Dr Lyn Hollingshead directly for clarity if needed.  Staff may message me via secure chat in Epic  but this may not receive an immediate response,  please page me for urgent  matters!  If 7PM-7AM, please contact night coverage [email or web address]

## 2023-09-26 NOTE — Plan of Care (Signed)

## 2023-09-27 ENCOUNTER — Other Ambulatory Visit: Payer: Self-pay

## 2023-09-27 ENCOUNTER — Telehealth (HOSPITAL_COMMUNITY): Payer: Self-pay | Admitting: Pharmacy Technician

## 2023-09-27 ENCOUNTER — Other Ambulatory Visit (HOSPITAL_COMMUNITY): Payer: Self-pay

## 2023-09-27 DIAGNOSIS — I5031 Acute diastolic (congestive) heart failure: Secondary | ICD-10-CM

## 2023-09-27 DIAGNOSIS — I38 Endocarditis, valve unspecified: Secondary | ICD-10-CM | POA: Diagnosis not present

## 2023-09-27 DIAGNOSIS — I4891 Unspecified atrial fibrillation: Secondary | ICD-10-CM | POA: Diagnosis not present

## 2023-09-27 LAB — CBC
HCT: 37.8 % (ref 36.0–46.0)
Hemoglobin: 12.3 g/dL (ref 12.0–15.0)
MCH: 27.7 pg (ref 26.0–34.0)
MCHC: 32.5 g/dL (ref 30.0–36.0)
MCV: 85.1 fL (ref 80.0–100.0)
Platelets: 171 10*3/uL (ref 150–400)
RBC: 4.44 MIL/uL (ref 3.87–5.11)
RDW: 14.5 % (ref 11.5–15.5)
WBC: 16.3 10*3/uL — ABNORMAL HIGH (ref 4.0–10.5)
nRBC: 0 % (ref 0.0–0.2)

## 2023-09-27 LAB — BASIC METABOLIC PANEL
Anion gap: 14 (ref 5–15)
Anion gap: 15 (ref 5–15)
BUN: 30 mg/dL — ABNORMAL HIGH (ref 6–20)
BUN: 35 mg/dL — ABNORMAL HIGH (ref 6–20)
CO2: 21 mmol/L — ABNORMAL LOW (ref 22–32)
CO2: 17 mmol/L — ABNORMAL LOW (ref 22–32)
Calcium: 8.6 mg/dL — ABNORMAL LOW (ref 8.9–10.3)
Calcium: 8.3 mg/dL — ABNORMAL LOW (ref 8.9–10.3)
Chloride: 98 mmol/L (ref 98–111)
Chloride: 100 mmol/L (ref 98–111)
Creatinine, Ser: 1.45 mg/dL — ABNORMAL HIGH (ref 0.44–1.00)
Creatinine, Ser: 1.52 mg/dL — ABNORMAL HIGH (ref 0.44–1.00)
GFR, Estimated: 44 mL/min — ABNORMAL LOW (ref >60)
GFR, Estimated: 42 mL/min — ABNORMAL LOW (ref >60)
Glucose, Bld: 251 mg/dL — ABNORMAL HIGH (ref 70–99)
Glucose, Bld: 173 mg/dL — ABNORMAL HIGH (ref 70–99)
Potassium: 4.2 mmol/L (ref 3.5–5.1)
Potassium: 4.3 mmol/L (ref 3.5–5.1)
Sodium: 133 mmol/L — ABNORMAL LOW (ref 135–145)
Sodium: 132 mmol/L — ABNORMAL LOW (ref 135–145)

## 2023-09-27 LAB — TROPONIN I (HIGH SENSITIVITY)
Troponin I (High Sensitivity): 16 ng/L (ref <18)
Troponin I (High Sensitivity): 16 ng/L (ref <18)

## 2023-09-27 LAB — ECHOCARDIOGRAM COMPLETE
AR max vel: 3.34 cm2
AV Area VTI: 3.69 cm2
AV Area mean vel: 3.14 cm2
AV Mean grad: 6 mmHg
AV Peak grad: 11.2 mmHg
Ao pk vel: 1.67 m/s
Area-P 1/2: 3.21 cm2
Height: 67 in
MV M vel: 5.31 m/s
MV Peak grad: 112.6 mmHg
MV VTI: 2.97 cm2
Radius: 1.2 cm
S' Lateral: 4.2 cm
Weight: 3375.68 [oz_av]

## 2023-09-27 LAB — HEPARIN LEVEL (UNFRACTIONATED)
Heparin Unfractionated: 0.6 [IU]/mL (ref 0.30–0.70)
Heparin Unfractionated: 0.54 [IU]/mL (ref 0.30–0.70)

## 2023-09-27 LAB — C-REACTIVE PROTEIN: CRP: 5.8 mg/dL — ABNORMAL HIGH (ref <1.0)

## 2023-09-27 LAB — SEDIMENTATION RATE: Sed Rate: 37 mm/h — ABNORMAL HIGH (ref 0–30)

## 2023-09-27 MED ORDER — BENZONATATE 100 MG PO CAPS
200.0000 mg | ORAL_CAPSULE | Freq: Three times a day (TID) | ORAL | Status: DC
  Administered 2023-09-27: 200 mg via ORAL
  Filled 2023-09-27: qty 2

## 2023-09-27 MED ORDER — HYDRALAZINE HCL 10 MG PO TABS
10.0000 mg | ORAL_TABLET | Freq: Three times a day (TID) | ORAL | Status: DC
Start: 2023-09-27 — End: 2023-09-28
  Administered 2023-09-28: 10 mg via ORAL
  Filled 2023-09-27 (×5): qty 1

## 2023-09-27 MED ORDER — GUAIFENESIN-CODEINE 100-10 MG/5ML PO SOLN
10.0000 mL | ORAL | Status: DC | PRN
  Administered 2023-09-27: 10 mL via ORAL
  Filled 2023-09-27: qty 10

## 2023-09-27 MED ORDER — MORPHINE SULFATE (PF) 2 MG/ML IV SOLN
2.0000 mg | INTRAVENOUS | Status: DC | PRN
  Administered 2023-09-27 – 2023-09-30 (×7): 2 mg via INTRAVENOUS
  Filled 2023-09-27 (×7): qty 1

## 2023-09-27 NOTE — Progress Notes (Signed)
 Heart Failure Stewardship Pharmacy Note  PCP: Ronnald Ramp, MD PCP-Cardiologist: None  HPI: Sandra Hines is a 53 y.o. female with essential hypertension and cerebral aneurysm s/p repair, tobacco use, dyslipidemia, who presented with generalized weakness/malaise over 2 weeks and acute onset palpitations. Found in clinic to be in atrial fibrillation with RVR with BP of 211/154 and rate of 182. On admission, BNP was 700, HS-troponin was 16, free T4 1.16, and TSH was 1.091. Chest x-ray noted mild pulmonary vascular congestion and consistent with prior granulomatous disease. CT PE noted no evidence of thrombus, possible PH, 4.3 cm aortic aneurysm, interstitial edema consistent with edema/CHF. Echocardiogram showed LVEF 45-50% with mild LVH, severely dilated LA, mildly reduced RV function, severe MR, mild-moderate TR.  Pertinent Lab Values: Creatinine  Date Value Ref Range Status  08/15/2014 0.80 0.60 - 1.30 mg/dL Final   Creatinine, Ser  Date Value Ref Range Status  09/27/2023 1.45 (H) 0.44 - 1.00 mg/dL Final   BUN  Date Value Ref Range Status  09/27/2023 30 (H) 6 - 20 mg/dL Final  40/98/1191 16 6 - 24 mg/dL Final  47/82/9562 10 7 - 18 mg/dL Final   Potassium  Date Value Ref Range Status  09/27/2023 4.2 3.5 - 5.1 mmol/L Final  08/15/2014 3.8 3.5 - 5.1 mmol/L Final   Sodium  Date Value Ref Range Status  09/27/2023 133 (L) 135 - 145 mmol/L Final  07/19/2023 139 134 - 144 mmol/L Final  08/15/2014 136 136 - 145 mmol/L Final   B Natriuretic Peptide  Date Value Ref Range Status  09/25/2023 700.0 (H) 0.0 - 100.0 pg/mL Final    Comment:    Performed at [facility], [address] [place], [place] [address]   Magnesium  Date Value Ref Range Status  09/25/2023 2.2 1.7 - 2.4 mg/dL Final    Comment:    Performed at [facility], [address] [place], [place] [address]   Hgb A1c MFr Bld  Date Value Ref Range Status  07/19/2023 5.8 (H) 4.8 - 5.6 %  Final    Comment:             Prediabetes: 5.7 - 6.4          Diabetes: >6.4          Glycemic control for adults with diabetes: <7.0    TSH  Date Value Ref Range Status  09/25/2023 1.091 0.350 - 4.500 uIU/mL Final    Comment:    Performed by a 3rd Generation assay with a functional sensitivity of <=0.01 uIU/mL. Performed at [facility], [address] [place], [place] [address]   07/19/2023 0.406 (L) 0.450 - 4.500 uIU/mL Final    Vital Signs: Admission weight: 210 lbs Temp:  [97.6 F (36.4 C)-97.9 F (36.6 C)] 97.7 F (36.5 C) (03/07 0400) Pulse Rate:  [32-135] 72 (03/07 1000) Cardiac Rhythm: Normal sinus rhythm (03/07 0800) Resp:  [15-35] 20 (03/07 1000) BP: (91-148)/(65-122) 132/97 (03/07 1000) SpO2:  [87 %-100 %] 92 % (03/07 1000)  Intake/Output Summary (Last 24 hours) at 09/27/2023 1029 Last data filed at 09/27/2023 0900 Gross per 24 hour  Intake 1228.87 ml  Output 1350 ml  Net -121.13 ml    Current Heart Failure Medications:  Loop diuretic: none Beta-Blocker: bisoprolol 5 mg daily ACEI/ARB/ARNI: none MRA: none SGLT2i: none Other: amlodipine 10 mg daily  Prior to admission Heart Failure Medications:  Loop diuretic: none Beta-Blocker: none ACEI/ARB/ARNI: none MRA: none SGLT2i: none Other: amlodipine 10  mg daily  Assessment: 1. Acute systolic heart failure (LVEF 40-45%) with mildly reduced RV function and severe MR, due to unknown etiology. NYHA class III symptoms.  -Symptoms: Reports shortness of breath is much improved. Endorses anxiety. Still feels fatigued. -Volume: Difficult to assess. No LEE. Creatinine bumped with diuresis. Symptoms improved. Agree with holding diuretics. -Hemodynamics: BP is elevated. HR is now 70s in NSR.  -BB: Would avoid beta blocker titration at this time given severe MR and current NSR. Continue bisoprolol 5 mg daily. -ACEI/ARB/ARNI: Can consider for afterload reduction in place of PTA amlodipine given new HFmrEF and  mildly reduced RV function. -MRA: Consider adding when creatinine improves. -SGLT2i: Consider adding when creatinine improves.  -PNA treatment in progress. -Cardiology planning work-up potentially outpatient. If work-up is otherwise inconclusive, connective tissue disease could explain cerebral aneurysm with myxomatous mitral valve (Marfarin and Ehlers-Danlos Syndromes).  Plan: 1) Medication changes recommended at this time: -None. Can consider transition from amlodipine to ARB tomorrow if creatinine improves.  2) Patient assistance: -Pending  3) Education: - Patient has been educated on current HF medications and potential additions to HF medication regimen - Patient verbalizes understanding that over the next few months, these medication doses may change and more medications may be added to optimize HF regimen - Patient has been educated on basic disease state pathophysiology and goals of therapy  Medication Assistance / Insurance Benefits Check: Does the patient have prescription insurance?    Type of insurance plan:  Does the patient qualify for medication assistance through manufacturers or grants? Pending   Outpatient Pharmacy: Prior to admission [facility]      Please do not hesitate to reach out with questions or concerns,  Enos Fling, PharmD, CPP, BCPS Heart Failure Pharmacist  Phone - [phone] 09/27/2023 10:29 AM

## 2023-09-27 NOTE — Progress Notes (Signed)
   Patient Name: Sandra Hines Date of Encounter: 09/27/2023 [facility] Cardiologist: New  Interval Summary  .    She converted to NSR last night. Remains in NSR this AM. BUN/Scr up this AM. She is still on 6L O2. Feels breathing is overall better, still coughing up phlegm. Echo showed mildly reduced LVEF 45-50% and severe MR.   Vital Signs .    Vitals:   09/27/23 0600 09/27/23 0700 09/27/23 0716 09/27/23 0724  BP: (!) 128/91     Pulse: 68 66  69  Resp: 20 (!) 21  (!) 33  Temp:      TempSrc:      SpO2: 92% 93% 91% 94%  Weight:      Height:        Intake/Output Summary (Last 24 hours) at 09/27/2023 0753 Last data filed at 09/27/2023 0715 Gross per 24 hour  Intake 1216.08 ml  Output 1350 ml  Net -133.92 ml      09/26/2023    1:37 AM 09/25/2023    5:03 PM 09/25/2023    4:04 PM  Last 3 Weights  Weight (lbs) 210 lb 15.7 oz 209 lb 210 lb 4.8 oz  Weight (kg) 95.7 kg 94.802 kg 95.391 kg      Telemetry/ECG    Afib>NSR around 7:20pm HR 60s-70s - Personally Reviewed  Physical Exam .   GEN: No acute distress.   Neck: No JVD Cardiac: RRR, + murmurs, no rubs, or gallops.  Respiratory: diffusely diminished. GI: Soft, nontender, non-distended  MS: No edema  Assessment & Plan .    New onset Afib - presented from PCPs office for New onset afib with HR 180s in the setting of acute bronchitis for 2 weeks, also found to have acute CHF - given IV lopressor 5mg  IV, IV Cardizem drip and IVF without improvement - spoke to cardiology who recommended IV amiodarone bolus and drip - she converted to NSR 3/6 in the pm. She remains in NSR this AM - IV dilt stopped 3/6 after conversion - TSH wnl - Keep K>4 and Mag>2 - CHADSVASC (CHF, HTN, female, PAD) at least 4 on IV heparin. She will require Eliquis at discharge - continue bisoprolol 5mg  daily - continue IV amiodarone>can decrease rate to 30mg /hr - echo showed mildly reduced LVEF 45-50% with severe MR. Low EF in the setting of  rapid Afib. May require cath eventually due to low EF and severe MR. Can likely be as OP.   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

## 2023-09-27 NOTE — Plan of Care (Signed)
 Continuing with plan of care.

## 2023-09-27 NOTE — Progress Notes (Signed)
 PHARMACY - ANTICOAGULATION CONSULT NOTE  Pharmacy Consult for heparin infusion Indication: atrial fibrillation  Allergies  Allergen Reactions   Penicillins Hives    Has patient had a PCN reaction causing immediate rash, facial/tongue/throat swelling, SOB or lightheadedness with hypotension: No Has patient had a PCN reaction causing severe rash involving mucus membranes or skin necrosis: No Has patient had a PCN reaction that required hospitalization: No Has patient had a PCN reaction occurring within the last 10 years: No If all of the above answers are "NO", then may proceed with Cephalosporin use.    Patient Measurements: Height: 5\' 7"  (170.2 cm) Weight: 95.7 kg (210 lb 15.7 oz) IBW/kg (Calculated) : 61.6 Heparin Dosing Weight: 82.6 kg  Vital Signs: Temp: 97.7 F (36.5 C) (03/07 0000) Temp Source: Oral (03/07 0000) BP: 116/96 (03/07 0300) Pulse Rate: 64 (03/07 0300)  Labs: Recent Labs    09/25/23 1711 09/26/23 0426 09/26/23 1745 09/27/23 0259  HGB 12.1 12.3  --  12.3  HCT 37.7 37.9  --  37.8  PLT 234 215  --  171  LABPROT  --  15.0  --   --   INR  --  1.2  --   --   HEPARINUNFRC  --   --  <0.10* 0.60  CREATININE 1.19* 0.95  --  1.45*  TROPONINIHS 16  --   --   --    Estimated Creatinine Clearance: 55.1 mL/min (A) (by C-G formula based on SCr of 1.45 mg/dL (H)).  Medical History: Past Medical History:  Diagnosis Date   Hypertension    Medications:  Scheduled:   amLODipine  10 mg Oral Daily   bisoprolol  5 mg Oral BID   buPROPion  150 mg Oral Daily   busPIRone  5 mg Oral TID   Chlorhexidine Gluconate Cloth  6 each Topical Daily   DULoxetine  40 mg Oral Daily   furosemide  40 mg Intravenous Q12H   ipratropium-albuterol  3 mL Nebulization Q6H WA   rosuvastatin  40 mg Oral Daily   Assessment: 53 y.o. female w/ PMH of essential hypertension and cerebral aneurysm s/p repair, who presented to the emergency room with acute onset of palpitations. She is on no  chronic anticoagulation PTA   ` Goal of Therapy:  Heparin level 0.3-0.7 units/ml Monitor platelets by anticoagulation protocol: Yes   Labs:  0306 1745 HL < 0.10; subtherapeutic on 1300 units/hr 0307 0259 HL 0.60, therapeutic x 1  Plan:  Continue heparin infusion at 1550 units/hr  Recheck next HL 6 hours to confirm  Continue to monitor H&H and platelets   Otelia Sergeant, PharmD, [facility] 09/27/2023 3:55 AM

## 2023-09-27 NOTE — Progress Notes (Signed)
 PHARMACY - ANTICOAGULATION CONSULT NOTE  Pharmacy Consult for heparin infusion Indication: atrial fibrillation  Allergies  Allergen Reactions   Penicillins Hives    Has patient had a PCN reaction causing immediate rash, facial/tongue/throat swelling, SOB or lightheadedness with hypotension: No Has patient had a PCN reaction causing severe rash involving mucus membranes or skin necrosis: No Has patient had a PCN reaction that required hospitalization: No Has patient had a PCN reaction occurring within the last 10 years: No If all of the above answers are "NO", then may proceed with Cephalosporin use.     Patient Measurements: Height: 5\' 7"  (170.2 cm) Weight: 95.7 kg (210 lb 15.7 oz) IBW/kg (Calculated) : 61.6 Heparin Dosing Weight: 82.6 kg22  Vital Signs: Temp: 97.7 F (36.5 C) (03/07 0400) Temp Source: Oral (03/07 0400) BP: 128/91 (03/07 0600) Pulse Rate: 68 (03/07 0600)  Labs: Recent Labs    09/25/23 1711 09/26/23 0426 09/26/23 1745 09/27/23 0259  HGB 12.1 12.3  --  12.3  HCT 37.7 37.9  --  37.8  PLT 234 215  --  171  LABPROT  --  15.0  --   --   INR  --  1.2  --   --   HEPARINUNFRC  --   --  <0.10* 0.60  CREATININE 1.19* 0.95  --  1.45*  TROPONINIHS 16  --   --   --     Estimated Creatinine Clearance: 55.1 mL/min (A) (by C-G formula based on SCr of 1.45 mg/dL (H)).   Medical History: Past Medical History:  Diagnosis Date   Hypertension     Medications:  Scheduled:   amLODipine  10 mg Oral Daily   bisoprolol  5 mg Oral BID   buPROPion  150 mg Oral Daily   busPIRone  5 mg Oral TID   Chlorhexidine Gluconate Cloth  6 each Topical Daily   DULoxetine  40 mg Oral Daily   furosemide  40 mg Intravenous Q12H   ipratropium-albuterol  3 mL Nebulization Q6H WA   rosuvastatin  40 mg Oral Daily    Assessment: 53 y.o. female w/ PMH of essential hypertension and cerebral aneurysm s/p repair, who presented to the emergency room with acute onset of palpitations. She  is on no chronic anticoagulation PTA   ` Goal of Therapy:  Heparin level 0.3-0.7 units/ml Monitor platelets by anticoagulation protocol: Yes   Plan: heparin level therapeutic x 2 ---continue heparin infusion at 1300 units/hr ---Check next anti-Xa level in am 3/8 and once daily while on heparin ---Continue to monitor H&H and platelets  Lowella Bandy 09/27/2023,7:06 AM

## 2023-09-27 NOTE — Progress Notes (Signed)
 Patient with 7/10 chest pain described as pressure, EKG obtained, as well as vital signs, and providers notified. Provider ordered stat troponin level and adjusted morphine frequency.  Patient has experienced a lot of coughing and tessalon pearls offered, patient declined to take this medication as it has been ineffective for her in the past. Providers notified.

## 2023-09-27 NOTE — Progress Notes (Addendum)
 Patient noted to have low urine output this shift however patient has had low oral intake, asked patient if she needed to void, patient exhausted and desires to void later.  Will endorse to oncoming shift.

## 2023-09-27 NOTE — Telephone Encounter (Addendum)
 Patient Product/process development scientist completed.    The patient is insured through [employer]. Patient has [employer], may use a copay card, and/or apply for patient assistance if available.    Ran test claim for Eliquis 5 mg and the current 30 day co-pay is $585.78 due to a $7500 deductible.  Ran test claim for Xarelto 20 mg and the current 30 day co-pay is $577.82 due to a $7500 deductible.  Ran test claim for Entresto 24-26 mg and the current 30 day co-pay is $681.33 due to a $7500 deductible.  Ran test claim for Jardiance 10 mg and Requires Prior authorization  Ran test claim for Farxiga 10 mg and Product Not Covered  Ran test claim for dabigatran (Pradaxa) 150 mg and Product Not on Formulary  This test claim was processed through [employer]- copay amounts may vary at other pharmacies due to [employer], or as the patient moves through the different stages of their insurance plan.     Roland Earl, CPHT Pharmacy Technician III Certified Patient Advocate [facility] Pharm[facility] Patient Advocate Team Direct Number: [phone]  Fax: [phone]

## 2023-09-27 NOTE — Plan of Care (Signed)
  Problem: Education: Goal: Knowledge of disease or condition will improve Outcome: Progressing Goal: Understanding of medication regimen will improve Outcome: Progressing   Problem: Cardiac: Goal: Ability to achieve and maintain adequate cardiopulmonary perfusion will improve Outcome: Progressing   Problem: Health Behavior/Discharge Planning: Goal: Ability to manage health-related needs will improve Outcome: Progressing   Problem: Clinical Measurements: Goal: Ability to maintain clinical measurements within normal limits will improve Outcome: Progressing

## 2023-09-27 NOTE — Progress Notes (Signed)
 PROGRESS NOTE    Sandra Hines   OZH:[id number] DOB: Aug 28, 1970  DOA: 09/25/2023 Date of Service: 09/27/23 which is hospital day 2  PCP: Ronnald Ramp, MD    Hospital course / significant events:   HPI: Sandra Hines is a 53 y.o. female with medical history significant for essential hypertension and cerebral aneurysm s/p repair, who presented to the emergency room with acute onset of palpitations 03/05 with a feeling of generalized weakness and malaise over the last couple weeks.   03/05: in ED, Afib RVR w/ rate 170s, CXR mild cardiomegaly, vascular congestion, question sequelae from granulomatous disease. In ED, received Ativan, Lopressor IV push, started Cardizem gtt but no response so started on amiodarone bolus/gtt. Cardiology consulted  03/06: requiring BiPap overnight, on HFNC this morning, starting tx for possible pneumonia/bronchitis today w/ abx steroids nebs, cardiology is adjusting medications for Afib, remains high HR but improved into the afternoon. 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 Converted to sinus.  03/07: on [place] O2 overnight. HR controlled, off dilt this morning, continue amio gtt into tomorrow, cardiology following and anticipate LHC and TEE next week      Consultants:  Cardiology   Procedures/Surgeries: none      ASSESSMENT & PLAN:   Atrial fibrillation with rapid ventricular response - converted to sinus rhythm   Cardiology following - [facility] IV amiodarone drip - 30 mg/h for another 24 hours and consider transition to po if remains in sinus rhythm into tomorrow  CHA2DS2-VASc score is 3, plan for anticoagulation eventually likely w/ eliquis, on heparin for now    Severe mitral regurgitation  Question undiagnosed rheumatic valvular heart disease or endocarditis (no obvious vegetation) maintain sinus rhythm and strict blood pressure control Will need TEE pending improvement in O2 and  renal fxn  Acute CHF (congestive heart failure) HFpEF in setting of severe MR as above  Diuresis IV lasix held for now, can give this afternoon pending repeat BMP Cardiology following  Beta blocker w/ bisoprolol, statin  Will need LHC pending improvement in O2 and renal fxn   Acute bronchitis Question CAP No formal COPD dx but possible this is contributing given tobacco hx  Treating for CAP w/ ceftriaxone + azithromycin Duonebs scheduled + prn  Steroids given hypoxia   Essential hypertension Continue amlodipine 10 mg daily Continue bisoprolol 5 mg bid  Added hydralazine 10 mg tid for afterload reduction Expect will depend on rate control / GDMT based on echo results   Dyslipidemia statin    Anxiety and depression Continue home Wellbutrin SR, BuSpar, Cymbalta. Benzos prn, will d/c these if there appears to be respiratory depression but pt has tolerated thus far   Class 1 obesity based on BMI: Body mass index is 33.04 kg/m.  Underweight - under 18  overweight - 25 to 29 obese - 30 or more Class 1 obesity: BMI of 30.0 to 34 Class 2 obesity: BMI of 35.0 to 39 Class 3 obesity: BMI of 40.0 to 49 Super Morbid Obesity: BMI 50-59 Super-super Morbid Obesity: BMI 60+ Significantly low or high BMI is associated with higher medical risk.  Weight management advised as adjunct to other disease management and risk reduction treatments    DVT prophylaxis: heparin IV fluids: no continuous IV fluids  Nutrition: cardiac diet  Central lines / invasive devices: none  Code Status: FULL CODE ACP documentation reviewed:  none on file in [facility]  TOC needs: TBD Barriers to dispo /  significant pending items: cardiac stabilization and workup, will need TEE and LHC likely next week              Subjective / Brief ROS:  Patient reports breathing is improved today but still SOB Significant cough  Denies CP Denies new weakness.  Tolerating diet.   Family Communication: none at  this time     Objective Findings:  Vitals:   09/27/23 1244 09/27/23 1300 09/27/23 1322 09/27/23 1400  BP: 102/83 109/84  115/85  Pulse: 66 73 67 67  Resp: (!) 27 (!) 23 (!) 38 19  Temp:      TempSrc:      SpO2: 92% 98% 93% 94%  Weight:      Height:        Intake/Output Summary (Last 24 hours) at 09/27/2023 1453 Last data filed at 09/27/2023 1400 Gross per 24 hour  Intake 1756.16 ml  Output 1350 ml  Net 406.16 ml   Filed Weights   09/25/23 1703 09/26/23 0137  Weight: 94.8 kg 95.7 kg    Examination:  Physical Exam Constitutional:      General: She is not in acute distress. Cardiovascular:     Rate and Rhythm: Tachycardia present. Rhythm irregular.  Pulmonary:     Effort: No respiratory distress.     Breath sounds: Wheezing, rhonchi (mild, scattred coarse breath sounds) and rales present.  Abdominal:     General: There is no distension.  Musculoskeletal:     Right lower leg: No edema.     Left lower leg: No edema.  Neurological:     General: No focal deficit present.     Mental Status: She is alert.  Psychiatric:        Mood and Affect: Affect normal. Mood is anxious.          Scheduled Medications:   amLODipine  10 mg Oral Daily   benzonatate  200 mg Oral TID   bisoprolol  5 mg Oral BID   buPROPion  150 mg Oral Daily   busPIRone  5 mg Oral TID   Chlorhexidine Gluconate Cloth  6 each Topical Daily   DULoxetine  40 mg Oral Daily   hydrALAZINE  10 mg Oral Q8H   ipratropium-albuterol  3 mL Nebulization Q6H WA   rosuvastatin  40 mg Oral Daily    Continuous Infusions:  amiodarone 30 mg/hr (09/27/23 1400)   azithromycin Stopped (09/27/23 1243)   cefTRIAXone (ROCEPHIN)  IV Stopped (09/27/23 1347)   heparin 1,550 Units/hr (09/27/23 1400)    PRN Medications:  acetaminophen, guaiFENesin-codeine, ipratropium-albuterol, LORazepam, magnesium hydroxide, morphine injection, ondansetron (ZOFRAN) IV, traZODone  Antimicrobials from admission:  Anti-infectives  (From admission, onward)    Start     Dose/Rate Route Frequency Ordered Stop   09/27/23 1200  azithromycin (ZITHROMAX) 250 mg in dextrose 5 % 125 mL IVPB        250 mg 127.5 mL/hr over 60 Minutes Intravenous Every 24 hours 09/26/23 1123 10/01/23 1159   09/26/23 1230  cefTRIAXone (ROCEPHIN) 2 g in sodium chloride 0.9 % 100 mL IVPB        2 g 200 mL/hr over 30 Minutes Intravenous Every 24 hours 09/26/23 1123     09/26/23 1230  azithromycin (ZITHROMAX) 500 mg in sodium chloride 0.9 % 250 mL IVPB        500 mg 250 mL/hr over 60 Minutes Intravenous  Once 09/26/23 1123 09/26/23 1753           Data  Reviewed:  I have personally reviewed the following...  CBC: Recent Labs  Lab 09/25/23 1711 09/26/23 0426 09/27/23 0259  WBC 13.9* 12.8* 16.3*  NEUTROABS 7.2  --   --   HGB 12.1 12.3 12.3  HCT 37.7 37.9 37.8  MCV 88.7 87.1 85.1  PLT 234 215 171   Basic Metabolic Panel: Recent Labs  Lab 09/25/23 1711 09/26/23 0426 09/27/23 0259 09/27/23 1325  NA 136 137 133* 132*  K 3.7 3.7 4.2 4.3  CL 104 104 98 100  CO2 22 21* 21* 17*  GLUCOSE 132* 151* 251* 173*  BUN 23* 22* 30* 35*  CREATININE 1.19* 0.95 1.45* 1.52*  CALCIUM 8.7* 8.5* 8.6* 8.3*  MG 2.2  --   --   --    GFR: Estimated Creatinine Clearance: 52.6 mL/min (A) (by C-G formula based on SCr of 1.52 mg/dL (H)). Liver Function Tests: No results for input(s): "AST", "ALT", "ALKPHOS", "BILITOT", "PROT", "ALBUMIN" in the last 168 hours. No results for input(s): "LIPASE", "AMYLASE" in the last 168 hours. No results for input(s): "AMMONIA" in the last 168 hours. Coagulation Profile: Recent Labs  Lab 09/26/23 0426  INR 1.2   Cardiac Enzymes: No results for input(s): "CKTOTAL", "CKMB", "CKMBINDEX", "TROPONINI" in the last 168 hours. BNP (last 3 results) No results for input(s): "PROBNP" in the last 8760 hours. HbA1C: No results for input(s): "HGBA1C" in the last 72 hours. CBG: Recent Labs  Lab 09/26/23 0131  GLUCAP  140*   Lipid Profile: Recent Labs    09/26/23 0426  CHOL 175  HDL 41  LDLCALC 118*  TRIG 79  CHOLHDL 4.3   Thyroid Function Tests: Recent Labs    09/25/23 1711  TSH 1.091  FREET4 1.16*   Anemia Panel: No results for input(s): "VITAMINB12", "FOLATE", "FERRITIN", "TIBC", "IRON", "RETICCTPCT" in the last 72 hours. Most Recent Urinalysis On File:     Component Value Date/Time   COLORURINE YELLOW (A) 11/26/2020 0041   APPEARANCEUR CLEAR (A) 11/26/2020 0041   APPEARANCEUR Cloudy 08/15/2014 0519   LABSPEC 1.026 11/26/2020 0041   LABSPEC 1.017 08/15/2014 0519   PHURINE 5.0 11/26/2020 0041   GLUCOSEU NEGATIVE 11/26/2020 0041   GLUCOSEU Negative 08/15/2014 0519   HGBUR NEGATIVE 11/26/2020 0041   BILIRUBINUR NEGATIVE 11/26/2020 0041   BILIRUBINUR Negative 08/15/2014 0519   KETONESUR NEGATIVE 11/26/2020 0041   PROTEINUR NEGATIVE 11/26/2020 0041   NITRITE NEGATIVE 11/26/2020 0041   LEUKOCYTESUR NEGATIVE 11/26/2020 0041   LEUKOCYTESUR Negative 08/15/2014 0519   Sepsis Labs: @LABRCNTIP (procalcitonin:4,lacticidven:4) Microbiology: Recent Results (from the past 240 hours)  Resp panel by RT-PCR (RSV, Flu A&B, Covid) Anterior Nasal Swab     Status: None   Collection Time: 09/25/23  5:22 PM   Specimen: Anterior Nasal Swab  Result Value Ref Range Status   SARS Coronavirus 2 by RT PCR NEGATIVE NEGATIVE Final    Comment: (NOTE) SARS-CoV-2 target nucleic acids are NOT DETECTED.  The SARS-CoV-2 RNA is generally detectable in upper respiratory specimens during the acute phase of infection. The lowest concentration of SARS-CoV-2 viral copies this assay can detect is 138 copies/mL. A negative result does not preclude SARS-Cov-2 infection and should not be used as the sole basis for treatment or other patient management decisions. A negative result may occur with  improper specimen collection/handling, submission of specimen other than nasopharyngeal swab, presence of viral mutation(s)  within the areas targeted by this assay, and inadequate number of viral copies(<138 copies/mL). A negative result must be combined with clinical observations,  patient history, and epidemiological information. The expected result is Negative.  Fact Sheet for Patients:  BloggerCourse.com  Fact Sheet for Healthcare Providers:  SeriousBroker.it  This test is no t yet approved or cleared by the [place] FDA and  has been authorized for detection and/or diagnosis of SARS-CoV-2 by FDA under an Emergency Use Authorization (EUA). This EUA will remain  in effect (meaning this test can be used) for the duration of the COVID-19 declaration under Section 564(b)(1) of the Act, 21 U.S.C.section 360bbb-3(b)(1), unless the authorization is terminated  or revoked sooner.       Influenza A by PCR NEGATIVE NEGATIVE Final   Influenza B by PCR NEGATIVE NEGATIVE Final    Comment: (NOTE) The Xpert Xpress SARS-CoV-2/FLU/RSV plus assay is intended as an aid in the diagnosis of influenza from Nasopharyngeal swab specimens and should not be used as a sole basis for treatment. Nasal washings and aspirates are unacceptable for Xpert Xpress SARS-CoV-2/FLU/RSV testing.  Fact Sheet for Patients: BloggerCourse.com  Fact Sheet for Healthcare Providers: SeriousBroker.it  This test is not yet approved or cleared by the [place] FDA and has been authorized for detection and/or diagnosis of SARS-CoV-2 by FDA under an Emergency Use Authorization (EUA). This EUA will remain in effect (meaning this test can be used) for the duration of the COVID-19 declaration under Section 564(b)(1) of the Act, 21 U.S.C. section 360bbb-3(b)(1), unless the authorization is terminated or revoked.     Resp Syncytial Virus by PCR NEGATIVE NEGATIVE Final    Comment: (NOTE) Fact Sheet for  Patients: BloggerCourse.com  Fact Sheet for Healthcare Providers: SeriousBroker.it  This test is not yet approved or cleared by the [place] FDA and has been authorized for detection and/or diagnosis of SARS-CoV-2 by FDA under an Emergency Use Authorization (EUA). This EUA will remain in effect (meaning this test can be used) for the duration of the COVID-19 declaration under Section 564(b)(1) of the Act, 21 U.S.C. section 360bbb-3(b)(1), unless the authorization is terminated or revoked.  Performed at [facility], [address] [place], [place] [address]   MRSA Next Gen by PCR, Nasal     Status: None   Collection Time: 09/26/23  1:39 AM   Specimen: Nasal Mucosa; Nasal Swab  Result Value Ref Range Status   MRSA by PCR Next Gen NOT DETECTED NOT DETECTED Final    Comment: (NOTE) The GeneXpert MRSA Assay (FDA approved for NASAL specimens only), is one component of a comprehensive MRSA colonization surveillance program. It is not intended to diagnose MRSA infection nor to guide or monitor treatment for MRSA infections. Test performance is not FDA approved in patients less than 42 years old. Performed at [facility], [address] [place], [place] [address]   Culture, blood (Routine X 2) w Reflex to ID Panel     Status: None (Preliminary result)   Collection Time: 09/26/23  3:31 AM   Specimen: BLOOD  Result Value Ref Range Status   Specimen Description BLOOD BLOOD LEFT HAND  Final   Special Requests   Final    BOTTLES DRAWN AEROBIC AND ANAEROBIC Blood Culture adequate volume   Culture   Final    NO GROWTH 1 DAY Performed at [facility], [address], [place], [place] [address]    Report Status PENDING  Incomplete  Culture, blood (Routine X 2) w Reflex to ID Panel     Status: None (Preliminary result)   Collection Time: 09/26/23  3:40 AM  Specimen: BLOOD  Result Value Ref Range Status    Specimen Description BLOOD BLOOD RIGHT HAND  Final   Special Requests   Final    BOTTLES DRAWN AEROBIC AND ANAEROBIC Blood Culture adequate volume   Culture   Final    NO GROWTH 1 DAY Performed at [facility], [address], [place], [place] [address]    Report Status PENDING  Incomplete      Radiology Studies last 3 days: ECHOCARDIOGRAM COMPLETE Result Date: 09/27/2023    ECHOCARDIOGRAM REPORT   Patient Name:   GWENDOLYN MCLEES Date of Exam: 09/26/2023 Medical Rec #:  [id number]    Height:       67.0 in Accession #:    [id number]   Weight:       211.0 lb Date of Birth:  07-Aug-1970   BSA:          2.069 m Patient Age:    52 years     BP:           150/137 mmHg Patient Gender: F            HR:           118 bpm. Exam Location:  [facility] Procedure: 2D Echo, Cardiac Doppler and Color Doppler (Both Spectral and Color            Flow Doppler were utilized during procedure). Indications:     Atrial Fibrillation  History:         Patient has no prior history of Echocardiogram examinations.                  CHF, Arrythmias:Atrial Fibrillation; Risk Factors:Hypertension,                  Dyslipidemia and Current Smoker.  Sonographer:     Mikki Harbor Referring Phys:  [id number] JAN A MANSY Diagnosing Phys: Julien Nordmann MD IMPRESSIONS  1. Left ventricular ejection fraction, by estimation, is 45 to 50%. The left ventricle has mildly decreased function. The left ventricle demonstrates global hypokinesis. The left ventricular internal cavity size was mildly dilated. There is mild left ventricular hypertrophy. Left ventricular diastolic parameters are indeterminate.  2. Right ventricular systolic function is mildly reduced. The right ventricular size is normal. There is normal pulmonary artery systolic pressure. The estimated right ventricular systolic pressure is 25.8 mmHg.  3. Left atrial size was severely dilated.  4. 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 No evidence of mitral  stenosis. The mean mitral valve gradient is 11.0 mmHg. Moderate mitral annular calcification.  5. Tricuspid valve regurgitation is mild to moderate.  6. The aortic valve is tricuspid. Aortic valve regurgitation is mild. No aortic stenosis is present.  7. There is borderline dilatation of the ascending aorta, measuring 38 mm.  8. The inferior vena cava is normal in size with greater than 50% respiratory variability, suggesting right atrial pressure of 3 mmHg. FINDINGS  Left Ventricle: Left ventricular ejection fraction, by estimation, is 45 to 50%. The left ventricle has mildly decreased function. The left ventricle demonstrates global hypokinesis. Strain was performed and the global longitudinal strain is indeterminate. The left ventricular internal cavity size was mildly dilated. There is mild left ventricular hypertrophy. Left ventricular diastolic parameters are indeterminate. Right Ventricle: The right ventricular size is normal. No increase in right ventricular wall thickness. Right ventricular systolic function is mildly reduced. There is normal pulmonary artery systolic pressure. The tricuspid regurgitant velocity is 2.39 m/s,  and with an assumed right atrial pressure of 3 mmHg, the estimated right ventricular systolic pressure is 25.8 mmHg. Left Atrium: Left atrial size was severely dilated. Right Atrium: Right atrial size was normal in size. Pericardium: There is no evidence of pericardial effusion. Mitral Valve: The mitral valve is rheumatic. There is moderate thickening of the mitral valve leaflet(s). There is mild calcification of the mitral valve leaflet(s). Moderate mitral annular calcification. Severe mitral valve regurgitation. No evidence of  mitral valve stenosis. MV peak gradient, 19.2 mmHg. The mean mitral valve gradient is 11.0 mmHg. Tricuspid Valve: The tricuspid valve is normal in structure. Tricuspid valve regurgitation is mild to moderate. No evidence of tricuspid stenosis. Aortic Valve: The  aortic valve is tricuspid. Aortic valve regurgitation is mild. No aortic stenosis is present. Aortic valve mean gradient measures 6.0 mmHg. Aortic valve peak gradient measures 11.2 mmHg. Aortic valve area, by VTI measures 3.69 cm. Pulmonic Valve: The pulmonic valve was normal in structure. Pulmonic valve regurgitation is not visualized. No evidence of pulmonic stenosis. Aorta: The aortic root is normal in size and structure. There is borderline dilatation of the ascending aorta, measuring 38 mm. Venous: The inferior vena cava is normal in size with greater than 50% respiratory variability, suggesting right atrial pressure of 3 mmHg. IAS/Shunts: No atrial level shunt detected by color flow Doppler. Additional Comments: 3D was performed not requiring image post processing on an independent workstation and was indeterminate.  LEFT VENTRICLE PLAX 2D LVIDd:         5.80 cm LVIDs:         4.20 cm LV PW:         1.30 cm LV IVS:        1.20 cm LVOT diam:     2.30 cm LV SV:         94 LV SV Index:   45 LVOT Area:     4.15 cm  RIGHT VENTRICLE RV Basal diam:  3.85 cm RV Mid diam:    2.90 cm RV S prime:     14.90 cm/s LEFT ATRIUM              Index        RIGHT ATRIUM           Index LA diam:        4.70 cm  2.27 cm/m   RA Area:     19.00 cm LA Vol (A2C):   125.0 ml 60.42 ml/m  RA Volume:   57.00 ml  27.55 ml/m LA Vol (A4C):   84.3 ml  40.75 ml/m LA Biplane Vol: 107.0 ml 51.72 ml/m  AORTIC VALVE                     PULMONIC VALVE AV Area (Vmax):    3.34 cm      PV Vmax:       0.98 m/s AV Area (Vmean):   3.14 cm      PV Peak grad:  3.9 mmHg AV Area (VTI):     3.69 cm AV Vmax:           167.33 cm/s AV Vmean:          113.267 cm/s AV VTI:            0.254 m AV Peak Grad:      11.2 mmHg AV Mean Grad:      6.0 mmHg LVOT Vmax:  134.50 cm/s LVOT Vmean:        85.500 cm/s LVOT VTI:          0.226 m LVOT/AV VTI ratio: 0.89  AORTA Ao Root diam: 3.60 cm Ao Asc diam:  3.80 cm MITRAL VALVE                  TRICUSPID VALVE  MV Area (PHT): 3.21 cm       TR Peak grad:   22.8 mmHg MV Area VTI:   2.97 cm       TR Vmax:        239.00 cm/s MV Peak grad:  19.2 mmHg MV Mean grad:  11.0 mmHg      SHUNTS MV Vmax:       2.19 m/s       Systemic VTI:  0.23 m MV Vmean:      159.0 cm/s     Systemic Diam: 2.30 cm MV Decel Time: 236 msec MR Peak grad:    112.6 mmHg MR Mean grad:    72.0 mmHg MR Vmax:         530.50 cm/s MR Vmean:        396.5 cm/s MR PISA:         9.05 cm MR PISA Eff ROA: 144 mm MR PISA Radius:  1.20 cm MV E velocity: 161.00 cm/s Julien Nordmann MD Electronically signed by Julien Nordmann MD Signature Date/Time: 09/26/2023/3:43:45 PM    Final (Updated)    CT Angio Chest Pulmonary Embolism (PE) W or WO Contrast Result Date: 09/26/2023 CLINICAL DATA:  New onset atrial fib with RVR, not feeling well the past few days, with shortness of breath. EXAM: CT ANGIOGRAPHY CHEST WITH CONTRAST TECHNIQUE: Multidetector CT imaging of the chest was performed using the standard protocol during bolus administration of intravenous contrast. Multiplanar CT image reconstructions and MIPs were obtained to evaluate the vascular anatomy. RADIATION DOSE REDUCTION: This exam was performed according to the departmental dose-optimization program which includes automated exposure control, adjustment of the mA and/or kV according to patient size and/or use of iterative reconstruction technique. CONTRAST:  OMNIPAQUE IOHEXOL 350 MG/ML SOLN COMPARISON:  Portable chest yesterday, chest radiograph 08/23/2015, and chest CT with contrast both 08/23/2015. FINDINGS: Cardiovascular: Since 2017, increased cardiomegaly with left chamber predominance. There is an enlarged pulmonary trunk measuring 3.6 cm indicating arterial hypertension, previously was 2.4 cm. Also noted is reflux of contrast into the IVC and hepatic veins which may suggest right heart dysfunction or tricuspid regurgitation. There is no increased RV/LV ratio and no embolic arterial filling defect is  seen. Central pulmonary veins are prominent. There is mild aortic atherosclerosis. Aortic opacification is insufficient to assess the lumen. The great vessels branch normally. Slight aneurysmal prominence is seen in the mid ascending segment which is 4.3 cm on 7:49 burden the aortic root measures 4 cm at the sinuses of Valsalva on 7:52. There is no substantial pericardial effusion. There are no visible coronary calcifications. Mediastinum/Nodes: Multiple calcified bihilar and mediastinal lymph nodes are again shown. There is a 1.7 cm hypodense nodule posteriorly in the right lobe of the thyroid gland. This was not seen previously. Nonemergent follow-up ultrasound is recommended. Axillary spaces are clear. No noncalcified adenopathy is seen in the chest. There are retained secretions versus aspirate in the thoracic trachea. The main bronchi are clear.  The thoracic esophagus is unremarkable. Lungs/Pleura: There is interlobular septal thickening with a basal gradient in the lungs consistent with interstitial edema, moderate.  Findings likely due to CHF or fluid overload. There is patchy haziness in the posterior lower lobes which most likely ground-glass edema. Diffuse bronchial thickening is seen and could be congestive, due to bronchitis or reactive airway disease. There are scattered calcified granulomas in both lungs. There is a stable 8 x 7 mm left lower lobe lateral basal nodule on 5:121, stable 4 mm left lower lobe pleural-based nodule laterally on 5:106. Bones are consistent with benign nodules given the length of stability. There is patchy dense peripheral airspace disease in the right upper lobe, a small amount in the right lower lobe perihilar area. This could be due to asymmetric airspace edema or superimposed pneumonia. Upper Abdomen: There are calcified splenic granulomas, occasional calcified hepatic granulomas. No calcified gallstones are seen. There is pericholecystic edema, in this case probably due  to congestive edema if there are no localizing symptoms. If there are localizing symptoms, ultrasound may be helpful for further study. No other significant upper abdominal findings. Abdominal aortic atherosclerosis. Musculoskeletal: Degenerative change and mild kyphosis thoracic spine. No acute or other significant osseous findings. No mass in the visualized chest wall. Review of the MIP images confirms the above findings. IMPRESSION: 1. No evidence of arterial embolus. 2. Increased cardiomegaly with left chamber predominance since 2017. 3. Enlarged pulmonary trunk 3.6 cm indicating arterial hypertension. 4. Reflux of contrast into the IVC and hepatic veins which may suggest right heart dysfunction or tricuspid regurgitation. 5. 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 Recommend annual imaging followup by CTA or MRA. This recommendation follows 2010 ACCF/AHA/AATS/ACR/ASA/SCA/SCAI/SIR/STS/ SVM Guidelines for the Diagnosis and Management of Patients with Thoracic Aortic Disease. Circulation. 2010; 121: G956-O130. Aortic aneurysm NOS (ICD10-I71.9). 6. Aortic atherosclerosis. 7. Interstitial edema with a basal gradient in the lungs, with patchy haziness in the posterior lower lobes most likely ground-glass edema. Findings consistent with CHF or fluid overload. 8. Diffuse bronchial thickening which could be congestive, due to bronchitis or reactive airway disease. 9. Patchy dense peripheral airspace disease in the right upper lobe, a small amount in the right lower lobe perihilar area. This could be due to asymmetric airspace edema or superimposed pneumonia. 10. Pericholecystic edema, probably due to congestive edema if there are no localizing symptoms. If there are localizing symptoms, ultrasound may be helpful for further study. 11. 1.7 cm hypodense nodule posteriorly in the right lobe of the thyroid gland. Nonemergent follow-up ultrasound is recommended. 12. Stable left lower lobe nodules. 13. Retained secretions versus  aspirate in the thoracic trachea. Aortic Atherosclerosis (ICD10-I70.0). Electronically Signed   By: Almira Bar M.D.   On: 09/26/2023 02:46   DG Chest Portable 1 View Result Date: 09/25/2023 CLINICAL DATA:  Cough and atrial fibrillation. EXAM: PORTABLE CHEST 1 VIEW COMPARISON:  August 23, 2015 FINDINGS: The cardiac silhouette is mildly enlarged. Mild prominence of the central pulmonary vasculature is seen. Ill-defined subcentimeter calcified pulmonary nodules are seen scattered throughout both lungs. No focal consolidation, pleural effusion or pneumothorax is identified. The visualized skeletal structures are unremarkable. IMPRESSION: 1. Mild cardiomegaly with mild central pulmonary vascular congestion. 2. Findings likely consistent with sequelae associated with prior granulomatous disease. Electronically Signed   By: Aram Candela M.D.   On: 09/25/2023 20:20       Time spent: 50 min     Sunnie Nielsen, DO [facility] 09/27/2023, 2:53 PM    Dictation software may have been used to generate the above note. Typos may occur and escape review in typed/dictated notes. Please contact Dr Lyn Hollingshead directly  for clarity if needed.  Staff may message me via secure chat in Epic  but this may not receive an immediate response,  please page me for urgent matters!  If 7PM-7AM, please contact night coverage [email or web address]

## 2023-09-28 ENCOUNTER — Inpatient Hospital Stay

## 2023-09-28 DIAGNOSIS — J208 Acute bronchitis due to other specified organisms: Secondary | ICD-10-CM | POA: Diagnosis not present

## 2023-09-28 DIAGNOSIS — I4891 Unspecified atrial fibrillation: Secondary | ICD-10-CM | POA: Diagnosis not present

## 2023-09-28 DIAGNOSIS — I5031 Acute diastolic (congestive) heart failure: Secondary | ICD-10-CM | POA: Diagnosis not present

## 2023-09-28 DIAGNOSIS — I38 Endocarditis, valve unspecified: Secondary | ICD-10-CM | POA: Diagnosis not present

## 2023-09-28 DIAGNOSIS — I340 Nonrheumatic mitral (valve) insufficiency: Secondary | ICD-10-CM

## 2023-09-28 DIAGNOSIS — I10 Essential (primary) hypertension: Secondary | ICD-10-CM | POA: Diagnosis not present

## 2023-09-28 HISTORY — DX: Nonrheumatic mitral (valve) insufficiency: I34.0

## 2023-09-28 LAB — CBC
HCT: 34.3 % — ABNORMAL LOW (ref 36.0–46.0)
Hemoglobin: 11.2 g/dL — ABNORMAL LOW (ref 12.0–15.0)
MCH: 28.2 pg (ref 26.0–34.0)
MCHC: 32.7 g/dL (ref 30.0–36.0)
MCV: 86.4 fL (ref 80.0–100.0)
Platelets: 138 10*3/uL — ABNORMAL LOW (ref 150–400)
RBC: 3.97 MIL/uL (ref 3.87–5.11)
RDW: 14.6 % (ref 11.5–15.5)
WBC: 15.5 10*3/uL — ABNORMAL HIGH (ref 4.0–10.5)
nRBC: 0 % (ref 0.0–0.2)

## 2023-09-28 LAB — HEPARIN LEVEL (UNFRACTIONATED)
Heparin Unfractionated: 0.24 [IU]/mL — ABNORMAL LOW (ref 0.30–0.70)
Heparin Unfractionated: 0.33 [IU]/mL (ref 0.30–0.70)
Heparin Unfractionated: 0.24 [IU]/mL — ABNORMAL LOW (ref 0.30–0.70)

## 2023-09-28 LAB — BASIC METABOLIC PANEL
Anion gap: 9 (ref 5–15)
BUN: 29 mg/dL — ABNORMAL HIGH (ref 6–20)
CO2: 26 mmol/L (ref 22–32)
Calcium: 8.2 mg/dL — ABNORMAL LOW (ref 8.9–10.3)
Chloride: 99 mmol/L (ref 98–111)
Creatinine, Ser: 1.09 mg/dL — ABNORMAL HIGH (ref 0.44–1.00)
GFR, Estimated: >60 mL/min (ref >60)
Glucose, Bld: 113 mg/dL — ABNORMAL HIGH (ref 70–99)
Potassium: 3.7 mmol/L (ref 3.5–5.1)
Sodium: 134 mmol/L — ABNORMAL LOW (ref 135–145)

## 2023-09-28 LAB — ANA W/REFLEX IF POSITIVE: Anti Nuclear Antibody (ANA): NEGATIVE

## 2023-09-28 MED ORDER — FUROSEMIDE 10 MG/ML IJ SOLN: 40.0000 mg | Freq: Two times a day (BID) | INTRAMUSCULAR | Status: DC

## 2023-09-28 MED ORDER — AZITHROMYCIN 250 MG PO TABS
250.0000 mg | ORAL_TABLET | Freq: Every day | ORAL | Status: AC
  Administered 2023-09-29 – 2023-09-30 (×2): 250 mg via ORAL
  Filled 2023-09-28 (×2): qty 1

## 2023-09-28 MED ORDER — HEPARIN BOLUS VIA INFUSION
1200.0000 [IU] | Freq: Once | INTRAVENOUS | Status: AC
  Administered 2023-09-28: 1200 [IU] via INTRAVENOUS
  Filled 2023-09-28: qty 1200

## 2023-09-28 MED ORDER — HEPARIN BOLUS VIA INFUSION
1250.0000 [IU] | Freq: Once | INTRAVENOUS | Status: AC
  Administered 2023-09-28: 1250 [IU] via INTRAVENOUS
  Filled 2023-09-28: qty 1250

## 2023-09-28 MED ORDER — FUROSEMIDE 10 MG/ML IJ SOLN
40.0000 mg | Freq: Once | INTRAMUSCULAR | Status: AC
  Administered 2023-09-28: 40 mg via INTRAVENOUS
  Filled 2023-09-28: qty 4

## 2023-09-28 MED ORDER — NITROGLYCERIN 2 % TD OINT
1.0000 [in_us] | TOPICAL_OINTMENT | Freq: Four times a day (QID) | TRANSDERMAL | Status: DC
  Administered 2023-09-28 – 2023-09-29 (×4): 1 [in_us] via TOPICAL
  Filled 2023-09-28 (×5): qty 1

## 2023-09-28 MED ORDER — FUROSEMIDE 10 MG/ML IJ SOLN
40.0000 mg | Freq: Every day | INTRAMUSCULAR | Status: DC
  Administered 2023-09-28 – 2023-09-29 (×2): 40 mg via INTRAVENOUS
  Filled 2023-09-28 (×2): qty 4

## 2023-09-28 NOTE — Progress Notes (Signed)
 PHARMACY - ANTICOAGULATION CONSULT NOTE  Pharmacy Consult for heparin infusion Indication: atrial fibrillation  Allergies  Allergen Reactions   Penicillins Hives    Has patient had a PCN reaction causing immediate rash, facial/tongue/throat swelling, SOB or lightheadedness with hypotension: No Has patient had a PCN reaction causing severe rash involving mucus membranes or skin necrosis: No Has patient had a PCN reaction that required hospitalization: No Has patient had a PCN reaction occurring within the last 10 years: No If all of the above answers are "NO", then may proceed with Cephalosporin use.     Patient Measurements: Height: 5\' 7"  (170.2 cm) Weight: 95.7 kg (210 lb 15.7 oz) IBW/kg (Calculated) : 61.6 Heparin Dosing Weight: 82.6 kg22  Vital Signs: Temp: 98.2 F (36.8 C) (03/08 0400) Temp Source: Axillary (03/08 0400) BP: 125/81 (03/08 0400) Pulse Rate: 76 (03/08 0400)  Labs: Recent Labs    09/25/23 1711 09/26/23 0426 09/26/23 1745 09/27/23 0259 09/27/23 0836 09/27/23 1325 09/27/23 1326 09/27/23 1440 09/28/23 0416  HGB 12.1 12.3  --  12.3  --   --   --   --  11.2*  HCT 37.7 37.9  --  37.8  --   --   --   --  34.3*  PLT 234 215  --  171  --   --   --   --  138*  LABPROT  --  15.0  --   --   --   --   --   --   --   INR  --  1.2  --   --   --   --   --   --   --   HEPARINUNFRC  --   --    < > 0.60 0.54  --   --   --  0.24*  CREATININE 1.19* 0.95  --  1.45*  --  1.52*  --   --   --   TROPONINIHS 16  --   --   --   --   --  16 16  --    < > = values in this interval not displayed.    Estimated Creatinine Clearance: 52.6 mL/min (A) (by C-G formula based on SCr of 1.52 mg/dL (H)).   Medical History: Past Medical History:  Diagnosis Date   Hypertension     Medications:  Scheduled:   amLODipine  10 mg Oral Daily   benzonatate  200 mg Oral TID   bisoprolol  5 mg Oral BID   buPROPion  150 mg Oral Daily   busPIRone  5 mg Oral TID   Chlorhexidine  Gluconate Cloth  6 each Topical Daily   DULoxetine  40 mg Oral Daily   hydrALAZINE  10 mg Oral Q8H   ipratropium-albuterol  3 mL Nebulization Q6H WA   rosuvastatin  40 mg Oral Daily    Assessment: 53 y.o. female w/ PMH of essential hypertension and cerebral aneurysm s/p repair, who presented to the emergency room with acute onset of palpitations. She is on no chronic anticoagulation PTA   ` Goal of Therapy:  Heparin level 0.3-0.7 units/ml Monitor platelets by anticoagulation protocol: Yes   Plan: heparin level subtherapeutic ---Bolus 1200 units x 1 ---Increase heparin infusion to 1750 units/hr ---Check next anti-Xa level in 6 hrs after rate change. ---Continue to monitor H&H and platelets  Otelia Sergeant, PharmD, [facility] 09/28/2023 6:34 AM

## 2023-09-28 NOTE — Progress Notes (Signed)
 Per provider ok to hold po medications at this time due to need for bi-pap.

## 2023-09-28 NOTE — Progress Notes (Signed)
 PHARMACY - ANTICOAGULATION CONSULT NOTE  Pharmacy Consult for Heparin Infusion Indication: atrial fibrillation  Allergies  Allergen Reactions   Penicillins Hives    Has patient had a PCN reaction causing immediate rash, facial/tongue/throat swelling, SOB or lightheadedness with hypotension: No Has patient had a PCN reaction causing severe rash involving mucus membranes or skin necrosis: No Has patient had a PCN reaction that required hospitalization: No Has patient had a PCN reaction occurring within the last 10 years: No If all of the above answers are "NO", then may proceed with Cephalosporin use.    Patient Measurements: Height: 5\' 7"  (170.2 cm) Weight: 95.7 kg (210 lb 15.7 oz) IBW/kg (Calculated) : 61.6 Heparin Dosing Weight: 82.6 kg  Vital Signs: Temp: 98 F (36.7 C) (03/08 1619) Temp Source: Oral (03/08 1619) BP: 109/66 (03/08 1900) Pulse Rate: 71 (03/08 1900)  Labs: Recent Labs    09/26/23 0426 09/26/23 1745 09/27/23 0259 09/27/23 0836 09/27/23 1325 09/27/23 1326 09/27/23 1440 09/28/23 0416 09/28/23 0833 09/28/23 1324 09/28/23 1912  HGB 12.3  --  12.3  --   --   --   --  11.2*  --   --   --   HCT 37.9  --  37.8  --   --   --   --  34.3*  --   --   --   PLT 215  --  171  --   --   --   --  138*  --   --   --   LABPROT 15.0  --   --   --   --   --   --   --   --   --   --   INR 1.2  --   --   --   --   --   --   --   --   --   --   HEPARINUNFRC  --    < > 0.60   < >  --   --   --  0.24*  --  0.33 0.24*  CREATININE 0.95  --  1.45*  --  1.52*  --   --   --  1.09*  --   --   TROPONINIHS  --   --   --   --   --  16 16  --   --   --   --    < > = values in this interval not displayed.   Estimated Creatinine Clearance: 73.3 mL/min (A) (by C-G formula based on SCr of 1.09 mg/dL (H)).  Medical History: Past Medical History:  Diagnosis Date   Hypertension    Medications:  Scheduled:   amLODipine  10 mg Oral Daily   [START ON 09/29/2023] azithromycin  250 mg  Oral Daily   bisoprolol  5 mg Oral BID   buPROPion  150 mg Oral Daily   busPIRone  5 mg Oral TID   Chlorhexidine Gluconate Cloth  6 each Topical Daily   DULoxetine  40 mg Oral Daily   furosemide  40 mg Intravenous Daily   ipratropium-albuterol  3 mL Nebulization Q6H WA   nitroGLYCERIN  1 inch Topical Q6H   rosuvastatin  40 mg Oral Daily   Assessment: JALIYAH FOTHERINGHAM is a 53 y.o. female presenting with palpitations. PMH significant for AVM s/p repair, TUD, HTN. Patient was not on [facility] PTA per chart review. Patient with newly diagnosed atrial fibrillation and a CHA2DS2-VASc score of at  least 3 (age, hypertension, and heart failure). Cardiology planning to transition to [facility] once it is clear that no invasive procedures will need to be done this admission. Pharmacy has been consulted to initiate and manage heparin infusion.   Baseline Labs: PT 15.0, INR 1.2, Hgb 12.3, Hct 37.9, Plt 215   Goal of Therapy:  Heparin level 0.3-0.7 units/ml Monitor platelets by anticoagulation protocol: Yes   Date Time HL Rate/Comment  3/6 1745 < 0.10 1300/subtherapeutic 3/7 0259 0.60 1550/therapeutic x1 3/7 0836 0.54 1550/therapeutic x2  3/8 0416 0.24 1550/subtherapeutic  3/8 1324 0.33 1750/therapeutic x1  3/8 1912 0.24 1750/subtherapeutic  Plan: Give 1250 units bolus x 1 Increase heparin infusion to 1900 units/hr Check HL in 6 hours  Continue to monitor H&H and platelets daily while on heparin infusion   Celene Squibb, PharmD Clinical Pharmacist 09/28/2023 7:54 PM

## 2023-09-28 NOTE — Progress Notes (Signed)
 Patient's CO2 level noted to have dropped and BUN elevated more, patient continues on bi-pap at 50% and unable to wean off due to oxygen saturations dropping to the low 80s when removed.  Provider consulted who reached out to ICU team, will continue to monitor patient.

## 2023-09-28 NOTE — Progress Notes (Addendum)
 PHARMACY - ANTICOAGULATION CONSULT NOTE  Pharmacy Consult for heparin infusion Indication: atrial fibrillation  Allergies  Allergen Reactions   Penicillins Hives    Has patient had a PCN reaction causing immediate rash, facial/tongue/throat swelling, SOB or lightheadedness with hypotension: No Has patient had a PCN reaction causing severe rash involving mucus membranes or skin necrosis: No Has patient had a PCN reaction that required hospitalization: No Has patient had a PCN reaction occurring within the last 10 years: No If all of the above answers are "NO", then may proceed with Cephalosporin use.    Patient Measurements: Height: 5\' 7"  (170.2 cm) Weight: 95.7 kg (210 lb 15.7 oz) IBW/kg (Calculated) : 61.6 Heparin Dosing Weight: 82.6 kg  Vital Signs: Temp: 98.1 F (36.7 C) (03/08 1200) Temp Source: Oral (03/08 1200) BP: 123/86 (03/08 1400) Pulse Rate: 52 (03/08 1400)  Labs: Recent Labs    09/25/23 1711 09/26/23 0426 09/26/23 1745 09/27/23 0259 09/27/23 0836 09/27/23 1325 09/27/23 1326 09/27/23 1440 09/28/23 0416 09/28/23 0833 09/28/23 1324  HGB 12.1 12.3  --  12.3  --   --   --   --  11.2*  --   --   HCT 37.7 37.9  --  37.8  --   --   --   --  34.3*  --   --   PLT 234 215  --  171  --   --   --   --  138*  --   --   LABPROT  --  15.0  --   --   --   --   --   --   --   --   --   INR  --  1.2  --   --   --   --   --   --   --   --   --   HEPARINUNFRC  --   --    < > 0.60 0.54  --   --   --  0.24*  --  0.33  CREATININE 1.19* 0.95  --  1.45*  --  1.52*  --   --   --  1.09*  --   TROPONINIHS 16  --   --   --   --   --  16 16  --   --   --    < > = values in this interval not displayed.   Estimated Creatinine Clearance: 73.3 mL/min (A) (by C-G formula based on SCr of 1.09 mg/dL (H)).  Medical History: Past Medical History:  Diagnosis Date   Hypertension    Medications:  Scheduled:   amLODipine  10 mg Oral Daily   benzonatate  200 mg Oral TID   bisoprolol  5  mg Oral BID   buPROPion  150 mg Oral Daily   busPIRone  5 mg Oral TID   Chlorhexidine Gluconate Cloth  6 each Topical Daily   DULoxetine  40 mg Oral Daily   furosemide  40 mg Intravenous Daily   hydrALAZINE  10 mg Oral Q8H   ipratropium-albuterol  3 mL Nebulization Q6H WA   nitroGLYCERIN  1 inch Topical Q6H   rosuvastatin  40 mg Oral Daily   Assessment: 53 y.o. female w/ PMH of essential hypertension and cerebral aneurysm s/p repair, who presented to the emergency room with acute onset of palpitations. She is on no chronic anticoagulation PTA .   Goal of Therapy:  Heparin level 0.3-0.7 units/ml Monitor platelets by anticoagulation  protocol: Yes   Labs:  0306 1745 HL < 0.10; subtherapeutic on 1300 units/hr 0307 0259 HL 0.60; therapeutic x 1 0307 0836 HL 0.54; therapeutic x 2  0308 0416 HL 0.24; subtherapeutic  0308 1324 HL 0.33; therapeutic x 1   Plan: HL therapeutic x 1  Continue heparin infusion at 1750 units/hr  Check next anti-Xa level in 6 hours at 1930 to confirm rate Monitor CBC daily and for s/sx of bleeding   Littie Deeds, PharmD Pharmacy Resident  09/28/2023 2:09 PM

## 2023-09-28 NOTE — Progress Notes (Signed)
 Rounding Note    Patient Name: Sandra Hines Date of Encounter: 09/28/2023  [facility] Cardiologist: [facility]  Subjective  Lasix held yesterday for creatinine 1.5 Started on BiPAP yesterday afternoon for respiratory distress Remained on BiPAP overnight and this morning Denies significant chest pain Receiving nebs every 6 hours, continued sputum production Telemetry showing normal sinus rhythm  Inpatient Medications    Scheduled Meds:  amLODipine  10 mg Oral Daily   benzonatate  200 mg Oral TID   bisoprolol  5 mg Oral BID   buPROPion  150 mg Oral Daily   busPIRone  5 mg Oral TID   Chlorhexidine Gluconate Cloth  6 each Topical Daily   DULoxetine  40 mg Oral Daily   furosemide  40 mg Intravenous Daily   hydrALAZINE  10 mg Oral Q8H   ipratropium-albuterol  3 mL Nebulization Q6H WA   nitroGLYCERIN  1 inch Topical Q6H   rosuvastatin  40 mg Oral Daily   Continuous Infusions:  amiodarone 30 mg/hr (09/28/23 0700)   azithromycin Stopped (09/27/23 1243)   cefTRIAXone (ROCEPHIN)  IV Stopped (09/27/23 1347)   heparin 1,550 Units/hr (09/28/23 0700)   PRN Meds: acetaminophen, guaiFENesin-codeine, ipratropium-albuterol, LORazepam, magnesium hydroxide, morphine injection, ondansetron (ZOFRAN) IV, traZODone   Vital Signs    Vitals:   09/28/23 0800 09/28/23 0809 09/28/23 0900 09/28/23 1000  BP: (!) 124/106  (!) 134/110 130/84  Pulse: (!) 39  78 73  Resp: (!) 25  (!) 23 17  Temp: 98 F (36.7 C)     TempSrc: Oral     SpO2: 98% 100% 92% 99%  Weight:      Height:        Intake/Output Summary (Last 24 hours) at 09/28/2023 1159 Last data filed at 09/28/2023 1000 Gross per 24 hour  Intake 893.96 ml  Output 1800 ml  Net -906.04 ml      09/26/2023    1:37 AM 09/25/2023    5:03 PM 09/25/2023    4:04 PM  Last 3 Weights  Weight (lbs) 210 lb 15.7 oz 209 lb 210 lb 4.8 oz  Weight (kg) 95.7 kg 94.802 kg 95.391 kg      Telemetry    Normal sinus rhythm- Personally Reviewed  ECG      - Personally Reviewed  Physical Exam   GEN: Mild respiratory distress, on CPAP Neck: No JVD Cardiac: RRR, no murmurs, rubs, or gallops.  Respiratory: Clear to auscultation bilaterally. GI: Soft, nontender, non-distended  MS: No edema; No deformity. Neuro:  Nonfocal  Psych: Normal affect   Labs    High Sensitivity Troponin:   Recent Labs  Lab 09/25/23 1711 09/27/23 1326 09/27/23 1440  TROPONINIHS 16 16 16      Chemistry Recent Labs  Lab 09/25/23 1711 09/26/23 0426 09/27/23 0259 09/27/23 1325 09/28/23 0833  NA 136   < > 133* 132* 134*  K 3.7   < > 4.2 4.3 3.7  CL 104   < > 98 100 99  CO2 22   < > 21* 17* 26  GLUCOSE 132*   < > 251* 173* 113*  BUN 23*   < > 30* 35* 29*  CREATININE 1.19*   < > 1.45* 1.52* 1.09*  CALCIUM 8.7*   < > 8.6* 8.3* 8.2*  MG 2.2  --   --   --   --   GFRNONAA 56*   < > 44* 42* >60  ANIONGAP 10   < > 14 15 9    < > =  values in this interval not displayed.    Lipids  Recent Labs  Lab 09/26/23 0426  CHOL 175  TRIG 79  HDL 41  LDLCALC 118*  CHOLHDL 4.3    Hematology Recent Labs  Lab 09/26/23 0426 09/27/23 0259 09/28/23 0416  WBC 12.8* 16.3* 15.5*  RBC 4.35 4.44 3.97  HGB 12.3 12.3 11.2*  HCT 37.9 37.8 34.3*  MCV 87.1 85.1 86.4  MCH 28.3 27.7 28.2  MCHC 32.5 32.5 32.7  RDW 14.5 14.5 14.6  PLT 215 171 138*   Thyroid  Recent Labs  Lab 09/25/23 1711  TSH 1.091  FREET4 1.16*    BNP Recent Labs  Lab 09/25/23 1711  BNP 700.0*    DDimer No results for input(s): "DDIMER" in the last 168 hours.   Radiology    DG Chest Port 1 View Result Date: 09/28/2023 CLINICAL DATA:  [id number] Respiratory distress [id number] EXAM: PORTABLE CHEST 1 VIEW COMPARISON:  September 25, 2023 FINDINGS: The cardiomediastinal silhouette is unchanged and enlarged in contour. Favor small RIGHT pleural effusion. No pneumothorax. Increased diffuse bilateral airspace opacities with vascular indistinctness and peribronchial cuffing. IMPRESSION: 1. Constellation of  findings are favored to reflect pulmonary edema, increased. Differential considerations include atypical infection. 2. RIGHT pleural effusion. Electronically Signed   By: Meda Klinefelter M.D.   On: 09/28/2023 08:32   ECHOCARDIOGRAM COMPLETE Result Date: 09/27/2023    ECHOCARDIOGRAM REPORT   Patient Name:   Sandra Hines Date of Exam: 09/26/2023 Medical Rec #:  [id number]    Height:       67.0 in Accession #:    [id number]   Weight:       211.0 lb Date of Birth:  March 11, 1971   BSA:          2.069 m Patient Age:    52 years     BP:           150/137 mmHg Patient Gender: F            HR:           118 bpm. Exam Location:  [facility] Procedure: 2D Echo, Cardiac Doppler and Color Doppler (Both Spectral and Color            Flow Doppler were utilized during procedure). Indications:     Atrial Fibrillation  History:         Patient has no prior history of Echocardiogram examinations.                  CHF, Arrythmias:Atrial Fibrillation; Risk Factors:Hypertension,                  Dyslipidemia and Current Smoker.  Sonographer:     Mikki Harbor Referring Phys:  [id number] JAN A MANSY Diagnosing Phys: Julien Nordmann MD IMPRESSIONS  1. Left ventricular ejection fraction, by estimation, is 45 to 50%. The left ventricle has mildly decreased function. The left ventricle demonstrates global hypokinesis. The left ventricular internal cavity size was mildly dilated. There is mild left ventricular hypertrophy. Left ventricular diastolic parameters are indeterminate.  2. Right ventricular systolic function is mildly reduced. The right ventricular size is normal. There is normal pulmonary artery systolic pressure. The estimated right ventricular systolic pressure is 25.8 mmHg.  3. Left atrial size was severely dilated.  4. 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 No evidence of mitral stenosis. The mean mitral valve gradient is 11.0 mmHg. Moderate mitral annular calcification.  5. Tricuspid valve regurgitation is  mild to moderate.  6. The aortic valve is tricuspid. Aortic valve regurgitation is mild. No aortic stenosis is present.  7. There is borderline dilatation of the ascending aorta, measuring 38 mm.  8. The inferior vena cava is normal in size with greater than 50% respiratory variability, suggesting right atrial pressure of 3 mmHg. FINDINGS  Left Ventricle: Left ventricular ejection fraction, by estimation, is 45 to 50%. The left ventricle has mildly decreased function. The left ventricle demonstrates global hypokinesis. Strain was performed and the global longitudinal strain is indeterminate. The left ventricular internal cavity size was mildly dilated. There is mild left ventricular hypertrophy. Left ventricular diastolic parameters are indeterminate. Right Ventricle: The right ventricular size is normal. No increase in right ventricular wall thickness. Right ventricular systolic function is mildly reduced. There is normal pulmonary artery systolic pressure. The tricuspid regurgitant velocity is 2.39 m/s, and with an assumed right atrial pressure of 3 mmHg, the estimated right ventricular systolic pressure is 25.8 mmHg. Left Atrium: Left atrial size was severely dilated. Right Atrium: Right atrial size was normal in size. Pericardium: There is no evidence of pericardial effusion. Mitral Valve: The mitral valve is rheumatic. There is moderate thickening of the mitral valve leaflet(s). There is mild calcification of the mitral valve leaflet(s). Moderate mitral annular calcification. Severe mitral valve regurgitation. No evidence of  mitral valve stenosis. MV peak gradient, 19.2 mmHg. The mean mitral valve gradient is 11.0 mmHg. Tricuspid Valve: The tricuspid valve is normal in structure. Tricuspid valve regurgitation is mild to moderate. No evidence of tricuspid stenosis. Aortic Valve: The aortic valve is tricuspid. Aortic valve regurgitation is mild. No aortic stenosis is present. Aortic valve mean gradient measures  6.0 mmHg. Aortic valve peak gradient measures 11.2 mmHg. Aortic valve area, by VTI measures 3.69 cm. Pulmonic Valve: The pulmonic valve was normal in structure. Pulmonic valve regurgitation is not visualized. No evidence of pulmonic stenosis. Aorta: The aortic root is normal in size and structure. There is borderline dilatation of the ascending aorta, measuring 38 mm. Venous: The inferior vena cava is normal in size with greater than 50% respiratory variability, suggesting right atrial pressure of 3 mmHg. IAS/Shunts: No atrial level shunt detected by color flow Doppler. Additional Comments: 3D was performed not requiring image post processing on an independent workstation and was indeterminate.  LEFT VENTRICLE PLAX 2D LVIDd:         5.80 cm LVIDs:         4.20 cm LV PW:         1.30 cm LV IVS:        1.20 cm LVOT diam:     2.30 cm LV SV:         94 LV SV Index:   45 LVOT Area:     4.15 cm  RIGHT VENTRICLE RV Basal diam:  3.85 cm RV Mid diam:    2.90 cm RV S prime:     14.90 cm/s LEFT ATRIUM              Index        RIGHT ATRIUM           Index LA diam:        4.70 cm  2.27 cm/m   RA Area:     19.00 cm LA Vol (A2C):   125.0 ml 60.42 ml/m  RA Volume:   57.00 ml  27.55 ml/m LA Vol (A4C):   84.3 ml  40.75 ml/m LA Biplane Vol: 107.0  ml 51.72 ml/m  AORTIC VALVE                     PULMONIC VALVE AV Area (Vmax):    3.34 cm      PV Vmax:       0.98 m/s AV Area (Vmean):   3.14 cm      PV Peak grad:  3.9 mmHg AV Area (VTI):     3.69 cm AV Vmax:           167.33 cm/s AV Vmean:          113.267 cm/s AV VTI:            0.254 m AV Peak Grad:      11.2 mmHg AV Mean Grad:      6.0 mmHg LVOT Vmax:         134.50 cm/s LVOT Vmean:        85.500 cm/s LVOT VTI:          0.226 m LVOT/AV VTI ratio: 0.89  AORTA Ao Root diam: 3.60 cm Ao Asc diam:  3.80 cm MITRAL VALVE                  TRICUSPID VALVE MV Area (PHT): 3.21 cm       TR Peak grad:   22.8 mmHg MV Area VTI:   2.97 cm       TR Vmax:        239.00 cm/s MV Peak grad:   19.2 mmHg MV Mean grad:  11.0 mmHg      SHUNTS MV Vmax:       2.19 m/s       Systemic VTI:  0.23 m MV Vmean:      159.0 cm/s     Systemic Diam: 2.30 cm MV Decel Time: 236 msec MR Peak grad:    112.6 mmHg MR Mean grad:    72.0 mmHg MR Vmax:         530.50 cm/s MR Vmean:        396.5 cm/s MR PISA:         9.05 cm MR PISA Eff ROA: 144 mm MR PISA Radius:  1.20 cm MV E velocity: 161.00 cm/s Julien Nordmann MD Electronically signed by Julien Nordmann MD Signature Date/Time: 09/26/2023/3:43:45 PM    Final (Updated)     Cardiac Studies   Echo  1. Left ventricular ejection fraction, by estimation, is 45 to 50%. The  left ventricle has mildly decreased function. The left ventricle  demonstrates global hypokinesis. The left ventricular internal cavity size  was mildly dilated. There is mild left  ventricular hypertrophy. Left ventricular diastolic parameters are  indeterminate.   2. Right ventricular systolic function is mildly reduced. The right  ventricular size is normal. There is normal pulmonary artery systolic  pressure. The estimated right ventricular systolic pressure is 25.8 mmHg.   3. Left atrial size was severely dilated.   4. 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 No  evidence of mitral stenosis. The mean mitral valve gradient is 11.0 mmHg.  Moderate mitral annular calcification.   5. Tricuspid valve regurgitation is mild to moderate.   6. The aortic valve is tricuspid. Aortic valve regurgitation is mild. No  aortic stenosis is present.   7. There is borderline dilatation of the ascending aorta, measuring 38  mm.   8. The inferior vena cava is normal in size with greater than 50%  respiratory variability,  suggesting right atrial pressure of 3 mmHg.   Patient Profile     Sandra Hines is a 53 year old woman with history of hypertension, cerebral aneurysm status postrepair, smoking, who presents to the hospital for malignant hypertension, atrial fibrillation with RVR    Assessment & Plan    Atrial fibrillation with RVR Presenting in atrial fibrillation with RVR, difficult to control rate on diltiazem infusion -Started on amiodarone infusion, bisoprolol, also dose of digoxin, converting to normal sinus rhythm -Severe MR with severely dilated left atrium likely contributing Given high risk of recurrent arrhythmia we will continue amiodarone infusion, heparin infusion, bisoprolol 5 twice daily  Viral URI/bronchitis Last 2 weeks with chest congestion, CT chest concerning for possible pneumonia, elevated white count Continued bronchitis symptoms- -supportive care with steroids, Xopenex nebulizer, antibiotics  Severe mitral valve regurgitation Rheumatic appearing valve, severely dilated left atrium, likely longstanding issue -Will need to follow-up closely -Once euvolemic consider limited echo to reevaluate mitral valve regurgitation  Acute respiratory distress Secondary to viral URI/bronchitis, CHF in the setting of severe MR -Will restart IV Lasix twice daily today, on BiPAP, antibiotics, nebs  Anxiety As Ativan as needed   Cannabinoid positive cessation recommended   For questions or updates, please contact [facility] Please consult [email or web address] for contact info under        Signed, Julien Nordmann, MD  09/28/2023, 11:59 AM

## 2023-09-28 NOTE — Plan of Care (Signed)
 Continuing with plan of care.

## 2023-09-28 NOTE — Progress Notes (Signed)
 PHARMACIST - PHYSICIAN COMMUNICATION DR: Lyn Hollingshead  CONCERNING: Antibiotic IV to Oral Route Change Policy  RECOMMENDATION: This patient is receiving azithromycin by the intravenous route. Based on criteria approved by the Pharmacy and Therapeutics Committee, the antibiotic(s) is/are being converted to the equivalent oral dose form(s).  DESCRIPTION: These criteria include: Patient being treated for a respiratory tract infection, urinary tract infection, cellulitis or clostridium difficile associated diarrhea if on metronidazole The patient is not neutropenic and does not exhibit a GI malabsorption state The patient is eating (either orally or via tube) and/or has been taking other orally administered medications for a least 24 hours The patient is improving clinically and has a Tmax < 100.5  If you have questions about this conversion, please contact the Pharmacy Department.

## 2023-09-28 NOTE — Progress Notes (Signed)
 PROGRESS NOTE    Sandra Hines   HYQ:[id number] DOB: 21-Mar-1971  DOA: 09/25/2023 Date of Service: 09/28/23 which is hospital day 3  PCP: Ronnald Ramp, MD    Hospital course / significant events:   HPI: Sandra Hines is a 53 y.o. female with medical history significant for essential hypertension and cerebral aneurysm s/p repair, who presented to the emergency room with acute onset of palpitations 03/05 with a feeling of generalized weakness and malaise over the last couple weeks.   03/05: in ED, Afib RVR w/ rate 170s, CXR mild cardiomegaly, vascular congestion, question sequelae from granulomatous disease. In ED, received Ativan, Lopressor IV push, started Cardizem gtt but no response so started on amiodarone bolus/gtt. Cardiology consulted  03/06: requiring BiPap overnight, on HFNC this morning, starting tx for possible pneumonia/bronchitis today w/ abx steroids nebs, cardiology is adjusting medications for Afib, remains high HR but improved into the afternoon. 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 Converted to sinus.  03/07: on [place] O2 overnight. HR controlled, off dilt this morning, continue amio gtt into tomorrow, cardiology following and anticipate LHC and TEE next week. Holding lasix for now d/t AKI. Back on BiPap this afternoon/evening.  03/08: remaining on BiPap overnight, Cr improved so re-started IV lasix. Cardio to add ntg paste, will try to avoid ntg gtt and art line if able. Diuresing well.      Consultants:  Cardiology [facility]   Procedures/Surgeries: none      ASSESSMENT & PLAN:   Atrial fibrillation with rapid ventricular response - converted to sinus rhythm   Cardiology following - [facility] IV amiodarone drip  CHA2DS2-VASc score is 3, plan for anticoagulation eventually likely w/ eliquis, on heparin for now    Severe mitral regurgitation  Question undiagnosed rheumatic valvular heart disease or  endocarditis (no obvious vegetation) maintain sinus rhythm and blood pressure control Will likely do TEE / heart cath pending improvement in O2 and renal fxn Ntg paste per cardiology   Acute CHF (congestive heart failure) HFpEF in setting of severe MR as above  Diuresis IV lasix restarted today given improvement in Cr Cardiology following  Beta blocker w/ bisoprolol, statin  Will likely do LHC pending improvement in O2 and renal fxn   Acute bronchitis Question CAP No formal COPD dx but possible this is contributing given tobacco hx  Lung sounds improved today as far as rhonchi/wheezing Treating for CAP w/ ceftriaxone + azithromycin Duonebs scheduled + prn  Steroids given hypoxia   Essential hypertension Continue amlodipine 10 mg daily Continue bisoprolol 5 mg bid  Continue hydralazine 10 mg tid for afterload reduction Expect will depend on rate control / GDMT based on echo results  Cardiology following - defer changes in meds to their recs   Dyslipidemia statin    Anxiety and depression Continue home Wellbutrin SR, BuSpar, Cymbalta. Benzos prn, will d/c these if there appears to be respiratory depression but pt has tolerated thus far   Class 1 obesity based on BMI: Body mass index is 33.04 kg/m.  Underweight - under 18  overweight - 25 to 29 obese - 30 or more Class 1 obesity: BMI of 30.0 to 34 Class 2 obesity: BMI of 35.0 to 39 Class 3 obesity: BMI of 40.0 to 49 Super Morbid Obesity: BMI 50-59 Super-super Morbid Obesity: BMI 60+ Significantly low or high BMI is associated with higher medical risk.  Weight management advised as adjunct to other disease management and  risk reduction treatments    DVT prophylaxis: heparin IV fluids: no continuous IV fluids  Nutrition: cardiac diet  Central lines / invasive devices: none  Code Status: FULL CODE ACP documentation reviewed:  none on file in [facility]  TOC needs: TBD Barriers to dispo / significant pending items: BiPAP,  cardiac stabilization and workup, will probably get TEE and LHC next week              Subjective / Brief ROS:  Patient reports breathing worse today  Back on BiPap Significant cough but improving  Denies CP  Family Communication: none at this time     Objective Findings:  Vitals:   09/28/23 0700 09/28/23 0800 09/28/23 0809 09/28/23 0900  BP: (!) 123/94 (!) 124/106  (!) 134/110  Pulse: 72 (!) 39  78  Resp: 20 (!) 25  (!) 23  Temp:  98 F (36.7 C)    TempSrc:  Oral    SpO2: 98% 98% 100% 92%  Weight:      Height:        Intake/Output Summary (Last 24 hours) at 09/28/2023 1007 Last data filed at 09/28/2023 0700 Gross per 24 hour  Intake 1062.83 ml  Output 800 ml  Net 262.83 ml   Filed Weights   09/25/23 1703 09/26/23 0137  Weight: 94.8 kg 95.7 kg    Examination:  Physical Exam Constitutional:      General: She is not in acute distress. Cardiovascular:     Rate and Rhythm: Normal rate and regular rhythm.  Pulmonary:     Effort: No respiratory distress.     Breath sounds: Rales present. No wheezing or rhonchi (mild, scattred coarse breath sounds).  Abdominal:     General: There is no distension.  Musculoskeletal:     Right lower leg: No edema.     Left lower leg: No edema.  Neurological:     General: No focal deficit present.     Mental Status: She is alert.  Psychiatric:        Mood and Affect: Affect normal. Mood is anxious.          Scheduled Medications:   amLODipine  10 mg Oral Daily   benzonatate  200 mg Oral TID   bisoprolol  5 mg Oral BID   buPROPion  150 mg Oral Daily   busPIRone  5 mg Oral TID   Chlorhexidine Gluconate Cloth  6 each Topical Daily   DULoxetine  40 mg Oral Daily   furosemide  40 mg Intravenous Daily   heparin  1,200 Units Intravenous Once   hydrALAZINE  10 mg Oral Q8H   ipratropium-albuterol  3 mL Nebulization Q6H WA   nitroGLYCERIN  1 inch Topical Q6H   rosuvastatin  40 mg Oral Daily    Continuous  Infusions:  amiodarone 30 mg/hr (09/28/23 0700)   azithromycin Stopped (09/27/23 1243)   cefTRIAXone (ROCEPHIN)  IV Stopped (09/27/23 1347)   heparin 1,550 Units/hr (09/28/23 0700)    PRN Medications:  acetaminophen, guaiFENesin-codeine, ipratropium-albuterol, LORazepam, magnesium hydroxide, morphine injection, ondansetron (ZOFRAN) IV, traZODone  Antimicrobials from admission:  Anti-infectives (From admission, onward)    Start     Dose/Rate Route Frequency Ordered Stop   09/27/23 1200  azithromycin (ZITHROMAX) 250 mg in dextrose 5 % 125 mL IVPB        250 mg 127.5 mL/hr over 60 Minutes Intravenous Every 24 hours 09/26/23 1123 10/01/23 1159   09/26/23 1230  cefTRIAXone (ROCEPHIN) 2 g in sodium chloride  0.9 % 100 mL IVPB        2 g 200 mL/hr over 30 Minutes Intravenous Every 24 hours 09/26/23 1123     09/26/23 1230  azithromycin (ZITHROMAX) 500 mg in sodium chloride 0.9 % 250 mL IVPB        500 mg 250 mL/hr over 60 Minutes Intravenous  Once 09/26/23 1123 09/26/23 1753           Data Reviewed:  I have personally reviewed the following...  CBC: Recent Labs  Lab 09/25/23 1711 09/26/23 0426 09/27/23 0259 09/28/23 0416  WBC 13.9* 12.8* 16.3* 15.5*  NEUTROABS 7.2  --   --   --   HGB 12.1 12.3 12.3 11.2*  HCT 37.7 37.9 37.8 34.3*  MCV 88.7 87.1 85.1 86.4  PLT 234 215 171 138*   Basic Metabolic Panel: Recent Labs  Lab 09/25/23 1711 09/26/23 0426 09/27/23 0259 09/27/23 1325 09/28/23 0833  NA 136 137 133* 132* 134*  K 3.7 3.7 4.2 4.3 3.7  CL 104 104 98 100 99  CO2 22 21* 21* 17* 26  GLUCOSE 132* 151* 251* 173* 113*  BUN 23* 22* 30* 35* 29*  CREATININE 1.19* 0.95 1.45* 1.52* 1.09*  CALCIUM 8.7* 8.5* 8.6* 8.3* 8.2*  MG 2.2  --   --   --   --    GFR: Estimated Creatinine Clearance: 73.3 mL/min (A) (by C-G formula based on SCr of 1.09 mg/dL (H)). Liver Function Tests: No results for input(s): "AST", "ALT", "ALKPHOS", "BILITOT", "PROT", "ALBUMIN" in the last 168  hours. No results for input(s): "LIPASE", "AMYLASE" in the last 168 hours. No results for input(s): "AMMONIA" in the last 168 hours. Coagulation Profile: Recent Labs  Lab 09/26/23 0426  INR 1.2   Cardiac Enzymes: No results for input(s): "CKTOTAL", "CKMB", "CKMBINDEX", "TROPONINI" in the last 168 hours. BNP (last 3 results) No results for input(s): "PROBNP" in the last 8760 hours. HbA1C: No results for input(s): "HGBA1C" in the last 72 hours. CBG: Recent Labs  Lab 09/26/23 0131  GLUCAP 140*   Lipid Profile: Recent Labs    09/26/23 0426  CHOL 175  HDL 41  LDLCALC 118*  TRIG 79  CHOLHDL 4.3   Thyroid Function Tests: Recent Labs    09/25/23 1711  TSH 1.091  FREET4 1.16*   Anemia Panel: No results for input(s): "VITAMINB12", "FOLATE", "FERRITIN", "TIBC", "IRON", "RETICCTPCT" in the last 72 hours. Most Recent Urinalysis On File:     Component Value Date/Time   COLORURINE YELLOW (A) 11/26/2020 0041   APPEARANCEUR CLEAR (A) 11/26/2020 0041   APPEARANCEUR Cloudy 08/15/2014 0519   LABSPEC 1.026 11/26/2020 0041   LABSPEC 1.017 08/15/2014 0519   PHURINE 5.0 11/26/2020 0041   GLUCOSEU NEGATIVE 11/26/2020 0041   GLUCOSEU Negative 08/15/2014 0519   HGBUR NEGATIVE 11/26/2020 0041   BILIRUBINUR NEGATIVE 11/26/2020 0041   BILIRUBINUR Negative 08/15/2014 0519   KETONESUR NEGATIVE 11/26/2020 0041   PROTEINUR NEGATIVE 11/26/2020 0041   NITRITE NEGATIVE 11/26/2020 0041   LEUKOCYTESUR NEGATIVE 11/26/2020 0041   LEUKOCYTESUR Negative 08/15/2014 0519   Sepsis Labs: @LABRCNTIP (procalcitonin:4,lacticidven:4) Microbiology: Recent Results (from the past 240 hours)  Resp panel by RT-PCR (RSV, Flu A&B, Covid) Anterior Nasal Swab     Status: None   Collection Time: 09/25/23  5:22 PM   Specimen: Anterior Nasal Swab  Result Value Ref Range Status   SARS Coronavirus 2 by RT PCR NEGATIVE NEGATIVE Final    Comment: (NOTE) SARS-CoV-2 target nucleic acids are NOT DETECTED.  The  SARS-CoV-2 RNA is generally detectable in upper respiratory specimens during the acute phase of infection. The lowest concentration of SARS-CoV-2 viral copies this assay can detect is 138 copies/mL. A negative result does not preclude SARS-Cov-2 infection and should not be used as the sole basis for treatment or other patient management decisions. A negative result may occur with  improper specimen collection/handling, submission of specimen other than nasopharyngeal swab, presence of viral mutation(s) within the areas targeted by this assay, and inadequate number of viral copies(<138 copies/mL). A negative result must be combined with clinical observations, patient history, and epidemiological information. The expected result is Negative.  Fact Sheet for Patients:  BloggerCourse.com  Fact Sheet for Healthcare Providers:  SeriousBroker.it  This test is no t yet approved or cleared by the [place] FDA and  has been authorized for detection and/or diagnosis of SARS-CoV-2 by FDA under an Emergency Use Authorization (EUA). This EUA will remain  in effect (meaning this test can be used) for the duration of the COVID-19 declaration under Section 564(b)(1) of the Act, 21 U.S.C.section 360bbb-3(b)(1), unless the authorization is terminated  or revoked sooner.       Influenza A by PCR NEGATIVE NEGATIVE Final   Influenza B by PCR NEGATIVE NEGATIVE Final    Comment: (NOTE) The Xpert Xpress SARS-CoV-2/FLU/RSV plus assay is intended as an aid in the diagnosis of influenza from Nasopharyngeal swab specimens and should not be used as a sole basis for treatment. Nasal washings and aspirates are unacceptable for Xpert Xpress SARS-CoV-2/FLU/RSV testing.  Fact Sheet for Patients: BloggerCourse.com  Fact Sheet for Healthcare Providers: SeriousBroker.it  This test is not yet approved or  cleared by the [place] FDA and has been authorized for detection and/or diagnosis of SARS-CoV-2 by FDA under an Emergency Use Authorization (EUA). This EUA will remain in effect (meaning this test can be used) for the duration of the COVID-19 declaration under Section 564(b)(1) of the Act, 21 U.S.C. section 360bbb-3(b)(1), unless the authorization is terminated or revoked.     Resp Syncytial Virus by PCR NEGATIVE NEGATIVE Final    Comment: (NOTE) Fact Sheet for Patients: BloggerCourse.com  Fact Sheet for Healthcare Providers: SeriousBroker.it  This test is not yet approved or cleared by the [place] FDA and has been authorized for detection and/or diagnosis of SARS-CoV-2 by FDA under an Emergency Use Authorization (EUA). This EUA will remain in effect (meaning this test can be used) for the duration of the COVID-19 declaration under Section 564(b)(1) of the Act, 21 U.S.C. section 360bbb-3(b)(1), unless the authorization is terminated or revoked.  Performed at [facility], [address] [place], [place] [address]   MRSA Next Gen by PCR, Nasal     Status: None   Collection Time: 09/26/23  1:39 AM   Specimen: Nasal Mucosa; Nasal Swab  Result Value Ref Range Status   MRSA by PCR Next Gen NOT DETECTED NOT DETECTED Final    Comment: (NOTE) The GeneXpert MRSA Assay (FDA approved for NASAL specimens only), is one component of a comprehensive MRSA colonization surveillance program. It is not intended to diagnose MRSA infection nor to guide or monitor treatment for MRSA infections. Test performance is not FDA approved in patients less than 32 years old. Performed at [facility], [address] [place], [place] [address]   Culture, blood (Routine X 2) w Reflex to ID Panel     Status: None (Preliminary result)   Collection Time: 09/26/23  3:31 AM  Specimen: BLOOD  Result Value Ref Range Status    Specimen Description BLOOD BLOOD LEFT HAND  Final   Special Requests   Final    BOTTLES DRAWN AEROBIC AND ANAEROBIC Blood Culture adequate volume   Culture   Final    NO GROWTH 2 DAYS Performed at [facility], [address] [place], [place] [address]    Report Status PENDING  Incomplete  Culture, blood (Routine X 2) w Reflex to ID Panel     Status: None (Preliminary result)   Collection Time: 09/26/23  3:40 AM   Specimen: BLOOD  Result Value Ref Range Status   Specimen Description BLOOD BLOOD RIGHT HAND  Final   Special Requests   Final    BOTTLES DRAWN AEROBIC AND ANAEROBIC Blood Culture adequate volume   Culture   Final    NO GROWTH 2 DAYS Performed at [facility], [address], [place], [place] [address]    Report Status PENDING  Incomplete      Radiology Studies last 3 days: DG Chest Port 1 View Result Date: 09/28/2023 CLINICAL DATA:  [id number] Respiratory distress [id number] EXAM: PORTABLE CHEST 1 VIEW COMPARISON:  September 25, 2023 FINDINGS: The cardiomediastinal silhouette is unchanged and enlarged in contour. Favor small RIGHT pleural effusion. No pneumothorax. Increased diffuse bilateral airspace opacities with vascular indistinctness and peribronchial cuffing. IMPRESSION: 1. Constellation of findings are favored to reflect pulmonary edema, increased. Differential considerations include atypical infection. 2. RIGHT pleural effusion. Electronically Signed   By: Meda Klinefelter M.D.   On: 09/28/2023 08:32   ECHOCARDIOGRAM COMPLETE Result Date: 09/27/2023    ECHOCARDIOGRAM REPORT   Patient Name:   EMMAJANE ALTAMURA Date of Exam: 09/26/2023 Medical Rec #:  [id number]    Height:       67.0 in Accession #:    [id number]   Weight:       211.0 lb Date of Birth:  10-14-1970   BSA:          2.069 m Patient Age:    52 years     BP:           150/137 mmHg Patient Gender: F            HR:           118 bpm. Exam Location:  [facility] Procedure: 2D Echo, Cardiac Doppler and Color Doppler  (Both Spectral and Color            Flow Doppler were utilized during procedure). Indications:     Atrial Fibrillation  History:         Patient has no prior history of Echocardiogram examinations.                  CHF, Arrythmias:Atrial Fibrillation; Risk Factors:Hypertension,                  Dyslipidemia and Current Smoker.  Sonographer:     Mikki Harbor Referring Phys:  [id number] JAN A MANSY Diagnosing Phys: Julien Nordmann MD IMPRESSIONS  1. Left ventricular ejection fraction, by estimation, is 45 to 50%. The left ventricle has mildly decreased function. The left ventricle demonstrates global hypokinesis. The left ventricular internal cavity size was mildly dilated. There is mild left ventricular hypertrophy. Left ventricular diastolic parameters are indeterminate.  2. Right ventricular systolic function is mildly reduced. The right ventricular size is normal. There is normal pulmonary artery systolic pressure. The estimated right ventricular systolic pressure is 25.8 mmHg.  3. Left  atrial size was severely dilated.  4. 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 No evidence of mitral stenosis. The mean mitral valve gradient is 11.0 mmHg. Moderate mitral annular calcification.  5. Tricuspid valve regurgitation is mild to moderate.  6. The aortic valve is tricuspid. Aortic valve regurgitation is mild. No aortic stenosis is present.  7. There is borderline dilatation of the ascending aorta, measuring 38 mm.  8. The inferior vena cava is normal in size with greater than 50% respiratory variability, suggesting right atrial pressure of 3 mmHg. FINDINGS  Left Ventricle: Left ventricular ejection fraction, by estimation, is 45 to 50%. The left ventricle has mildly decreased function. The left ventricle demonstrates global hypokinesis. Strain was performed and the global longitudinal strain is indeterminate. The left ventricular internal cavity size was mildly dilated. There is mild left  ventricular hypertrophy. Left ventricular diastolic parameters are indeterminate. Right Ventricle: The right ventricular size is normal. No increase in right ventricular wall thickness. Right ventricular systolic function is mildly reduced. There is normal pulmonary artery systolic pressure. The tricuspid regurgitant velocity is 2.39 m/s, and with an assumed right atrial pressure of 3 mmHg, the estimated right ventricular systolic pressure is 25.8 mmHg. Left Atrium: Left atrial size was severely dilated. Right Atrium: Right atrial size was normal in size. Pericardium: There is no evidence of pericardial effusion. Mitral Valve: The mitral valve is rheumatic. There is moderate thickening of the mitral valve leaflet(s). There is mild calcification of the mitral valve leaflet(s). Moderate mitral annular calcification. Severe mitral valve regurgitation. No evidence of  mitral valve stenosis. MV peak gradient, 19.2 mmHg. The mean mitral valve gradient is 11.0 mmHg. Tricuspid Valve: The tricuspid valve is normal in structure. Tricuspid valve regurgitation is mild to moderate. No evidence of tricuspid stenosis. Aortic Valve: The aortic valve is tricuspid. Aortic valve regurgitation is mild. No aortic stenosis is present. Aortic valve mean gradient measures 6.0 mmHg. Aortic valve peak gradient measures 11.2 mmHg. Aortic valve area, by VTI measures 3.69 cm. Pulmonic Valve: The pulmonic valve was normal in structure. Pulmonic valve regurgitation is not visualized. No evidence of pulmonic stenosis. Aorta: The aortic root is normal in size and structure. There is borderline dilatation of the ascending aorta, measuring 38 mm. Venous: The inferior vena cava is normal in size with greater than 50% respiratory variability, suggesting right atrial pressure of 3 mmHg. IAS/Shunts: No atrial level shunt detected by color flow Doppler. Additional Comments: 3D was performed not requiring image post processing on an independent  workstation and was indeterminate.  LEFT VENTRICLE PLAX 2D LVIDd:         5.80 cm LVIDs:         4.20 cm LV PW:         1.30 cm LV IVS:        1.20 cm LVOT diam:     2.30 cm LV SV:         94 LV SV Index:   45 LVOT Area:     4.15 cm  RIGHT VENTRICLE RV Basal diam:  3.85 cm RV Mid diam:    2.90 cm RV S prime:     14.90 cm/s LEFT ATRIUM              Index        RIGHT ATRIUM           Index LA diam:        4.70 cm  2.27 cm/m   RA  Area:     19.00 cm LA Vol (A2C):   125.0 ml 60.42 ml/m  RA Volume:   57.00 ml  27.55 ml/m LA Vol (A4C):   84.3 ml  40.75 ml/m LA Biplane Vol: 107.0 ml 51.72 ml/m  AORTIC VALVE                     PULMONIC VALVE AV Area (Vmax):    3.34 cm      PV Vmax:       0.98 m/s AV Area (Vmean):   3.14 cm      PV Peak grad:  3.9 mmHg AV Area (VTI):     3.69 cm AV Vmax:           167.33 cm/s AV Vmean:          113.267 cm/s AV VTI:            0.254 m AV Peak Grad:      11.2 mmHg AV Mean Grad:      6.0 mmHg LVOT Vmax:         134.50 cm/s LVOT Vmean:        85.500 cm/s LVOT VTI:          0.226 m LVOT/AV VTI ratio: 0.89  AORTA Ao Root diam: 3.60 cm Ao Asc diam:  3.80 cm MITRAL VALVE                  TRICUSPID VALVE MV Area (PHT): 3.21 cm       TR Peak grad:   22.8 mmHg MV Area VTI:   2.97 cm       TR Vmax:        239.00 cm/s MV Peak grad:  19.2 mmHg MV Mean grad:  11.0 mmHg      SHUNTS MV Vmax:       2.19 m/s       Systemic VTI:  0.23 m MV Vmean:      159.0 cm/s     Systemic Diam: 2.30 cm MV Decel Time: 236 msec MR Peak grad:    112.6 mmHg MR Mean grad:    72.0 mmHg MR Vmax:         530.50 cm/s MR Vmean:        396.5 cm/s MR PISA:         9.05 cm MR PISA Eff ROA: 144 mm MR PISA Radius:  1.20 cm MV E velocity: 161.00 cm/s Julien Nordmann MD Electronically signed by Julien Nordmann MD Signature Date/Time: 09/26/2023/3:43:45 PM    Final (Updated)    CT Angio Chest Pulmonary Embolism (PE) W or WO Contrast Result Date: 09/26/2023 CLINICAL DATA:  New onset atrial fib with RVR, not feeling well the past  few days, with shortness of breath. EXAM: CT ANGIOGRAPHY CHEST WITH CONTRAST TECHNIQUE: Multidetector CT imaging of the chest was performed using the standard protocol during bolus administration of intravenous contrast. Multiplanar CT image reconstructions and MIPs were obtained to evaluate the vascular anatomy. RADIATION DOSE REDUCTION: This exam was performed according to the departmental dose-optimization program which includes automated exposure control, adjustment of the mA and/or kV according to patient size and/or use of iterative reconstruction technique. CONTRAST:  OMNIPAQUE IOHEXOL 350 MG/ML SOLN COMPARISON:  Portable chest yesterday, chest radiograph 08/23/2015, and chest CT with contrast both 08/23/2015. FINDINGS: Cardiovascular: Since 2017, increased cardiomegaly with left chamber predominance. There is an enlarged pulmonary trunk measuring 3.6 cm indicating arterial hypertension, previously was 2.4  cm. Also noted is reflux of contrast into the IVC and hepatic veins which may suggest right heart dysfunction or tricuspid regurgitation. There is no increased RV/LV ratio and no embolic arterial filling defect is seen. Central pulmonary veins are prominent. There is mild aortic atherosclerosis. Aortic opacification is insufficient to assess the lumen. The great vessels branch normally. Slight aneurysmal prominence is seen in the mid ascending segment which is 4.3 cm on 7:49 burden the aortic root measures 4 cm at the sinuses of Valsalva on 7:52. There is no substantial pericardial effusion. There are no visible coronary calcifications. Mediastinum/Nodes: Multiple calcified bihilar and mediastinal lymph nodes are again shown. There is a 1.7 cm hypodense nodule posteriorly in the right lobe of the thyroid gland. This was not seen previously. Nonemergent follow-up ultrasound is recommended. Axillary spaces are clear. No noncalcified adenopathy is seen in the chest. There are retained secretions versus  aspirate in the thoracic trachea. The main bronchi are clear.  The thoracic esophagus is unremarkable. Lungs/Pleura: There is interlobular septal thickening with a basal gradient in the lungs consistent with interstitial edema, moderate. Findings likely due to CHF or fluid overload. There is patchy haziness in the posterior lower lobes which most likely ground-glass edema. Diffuse bronchial thickening is seen and could be congestive, due to bronchitis or reactive airway disease. There are scattered calcified granulomas in both lungs. There is a stable 8 x 7 mm left lower lobe lateral basal nodule on 5:121, stable 4 mm left lower lobe pleural-based nodule laterally on 5:106. Bones are consistent with benign nodules given the length of stability. There is patchy dense peripheral airspace disease in the right upper lobe, a small amount in the right lower lobe perihilar area. This could be due to asymmetric airspace edema or superimposed pneumonia. Upper Abdomen: There are calcified splenic granulomas, occasional calcified hepatic granulomas. No calcified gallstones are seen. There is pericholecystic edema, in this case probably due to congestive edema if there are no localizing symptoms. If there are localizing symptoms, ultrasound may be helpful for further study. No other significant upper abdominal findings. Abdominal aortic atherosclerosis. Musculoskeletal: Degenerative change and mild kyphosis thoracic spine. No acute or other significant osseous findings. No mass in the visualized chest wall. Review of the MIP images confirms the above findings. IMPRESSION: 1. No evidence of arterial embolus. 2. Increased cardiomegaly with left chamber predominance since 2017. 3. Enlarged pulmonary trunk 3.6 cm indicating arterial hypertension. 4. Reflux of contrast into the IVC and hepatic veins which may suggest right heart dysfunction or tricuspid regurgitation. 5. 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 Recommend annual imaging  followup by CTA or MRA. This recommendation follows 2010 ACCF/AHA/AATS/ACR/ASA/SCA/SCAI/SIR/STS/ SVM Guidelines for the Diagnosis and Management of Patients with Thoracic Aortic Disease. Circulation. 2010; 121: W098-J191. Aortic aneurysm NOS (ICD10-I71.9). 6. Aortic atherosclerosis. 7. Interstitial edema with a basal gradient in the lungs, with patchy haziness in the posterior lower lobes most likely ground-glass edema. Findings consistent with CHF or fluid overload. 8. Diffuse bronchial thickening which could be congestive, due to bronchitis or reactive airway disease. 9. Patchy dense peripheral airspace disease in the right upper lobe, a small amount in the right lower lobe perihilar area. This could be due to asymmetric airspace edema or superimposed pneumonia. 10. Pericholecystic edema, probably due to congestive edema if there are no localizing symptoms. If there are localizing symptoms, ultrasound may be helpful for further study. 11. 1.7 cm hypodense nodule posteriorly in the right lobe of the thyroid gland. Nonemergent follow-up  ultrasound is recommended. 12. Stable left lower lobe nodules. 13. Retained secretions versus aspirate in the thoracic trachea. Aortic Atherosclerosis (ICD10-I70.0). Electronically Signed   By: Almira Bar M.D.   On: 09/26/2023 02:46   DG Chest Portable 1 View Result Date: 09/25/2023 CLINICAL DATA:  Cough and atrial fibrillation. EXAM: PORTABLE CHEST 1 VIEW COMPARISON:  August 23, 2015 FINDINGS: The cardiac silhouette is mildly enlarged. Mild prominence of the central pulmonary vasculature is seen. Ill-defined subcentimeter calcified pulmonary nodules are seen scattered throughout both lungs. No focal consolidation, pleural effusion or pneumothorax is identified. The visualized skeletal structures are unremarkable. IMPRESSION: 1. Mild cardiomegaly with mild central pulmonary vascular congestion. 2. Findings likely consistent with sequelae associated with prior granulomatous  disease. Electronically Signed   By: Aram Candela M.D.   On: 09/25/2023 20:20       Time spent: 50 min     Sunnie Nielsen, DO [facility] 09/28/2023, 10:07 AM    Dictation software may have been used to generate the above note. Typos may occur and escape review in typed/dictated notes. Please contact Dr Lyn Hollingshead directly for clarity if needed.  Staff may message me via secure chat in Epic  but this may not receive an immediate response,  please page me for urgent matters!  If 7PM-7AM, please contact night coverage [email or web address]

## 2023-09-29 DIAGNOSIS — I5031 Acute diastolic (congestive) heart failure: Secondary | ICD-10-CM

## 2023-09-29 DIAGNOSIS — I340 Nonrheumatic mitral (valve) insufficiency: Secondary | ICD-10-CM | POA: Diagnosis not present

## 2023-09-29 DIAGNOSIS — I4891 Unspecified atrial fibrillation: Secondary | ICD-10-CM | POA: Diagnosis not present

## 2023-09-29 DIAGNOSIS — I10 Essential (primary) hypertension: Secondary | ICD-10-CM | POA: Diagnosis not present

## 2023-09-29 DIAGNOSIS — I5022 Chronic systolic (congestive) heart failure: Secondary | ICD-10-CM

## 2023-09-29 DIAGNOSIS — J208 Acute bronchitis due to other specified organisms: Secondary | ICD-10-CM | POA: Diagnosis not present

## 2023-09-29 LAB — CBC
HCT: 33.1 % — ABNORMAL LOW (ref 36.0–46.0)
Hemoglobin: 10.9 g/dL — ABNORMAL LOW (ref 12.0–15.0)
MCH: 28 pg (ref 26.0–34.0)
MCHC: 32.9 g/dL (ref 30.0–36.0)
MCV: 85.1 fL (ref 80.0–100.0)
Platelets: 124 10*3/uL — ABNORMAL LOW (ref 150–400)
RBC: 3.89 MIL/uL (ref 3.87–5.11)
RDW: 14.6 % (ref 11.5–15.5)
WBC: 12 10*3/uL — ABNORMAL HIGH (ref 4.0–10.5)
nRBC: 0 % (ref 0.0–0.2)

## 2023-09-29 LAB — HEPARIN LEVEL (UNFRACTIONATED)
Heparin Unfractionated: 0.32 [IU]/mL (ref 0.30–0.70)
Heparin Unfractionated: 0.24 [IU]/mL — ABNORMAL LOW (ref 0.30–0.70)
Heparin Unfractionated: 0.36 [IU]/mL (ref 0.30–0.70)

## 2023-09-29 LAB — BASIC METABOLIC PANEL
Anion gap: 8 (ref 5–15)
BUN: 21 mg/dL — ABNORMAL HIGH (ref 6–20)
CO2: 29 mmol/L (ref 22–32)
Calcium: 7.8 mg/dL — ABNORMAL LOW (ref 8.9–10.3)
Chloride: 98 mmol/L (ref 98–111)
Creatinine, Ser: 0.87 mg/dL (ref 0.44–1.00)
GFR, Estimated: >60 mL/min (ref >60)
Glucose, Bld: 108 mg/dL — ABNORMAL HIGH (ref 70–99)
Potassium: 3.3 mmol/L — ABNORMAL LOW (ref 3.5–5.1)
Sodium: 135 mmol/L (ref 135–145)

## 2023-09-29 LAB — RHEUMATOID FACTOR: Rheumatoid fact SerPl-aCnc: 10.8 [IU]/mL (ref <14.0)

## 2023-09-29 MED ORDER — IPRATROPIUM-ALBUTEROL 0.5-2.5 (3) MG/3ML IN SOLN
3.0000 mL | Freq: Two times a day (BID) | RESPIRATORY_TRACT | Status: DC
  Administered 2023-09-30 – 2023-10-01 (×3): 3 mL via RESPIRATORY_TRACT
  Filled 2023-09-29 (×4): qty 3

## 2023-09-29 MED ORDER — FUROSEMIDE 10 MG/ML IJ SOLN
40.0000 mg | Freq: Two times a day (BID) | INTRAMUSCULAR | Status: DC
  Administered 2023-09-29 – 2023-10-01 (×4): 40 mg via INTRAVENOUS
  Filled 2023-09-29 (×4): qty 4

## 2023-09-29 MED ORDER — NITROGLYCERIN 2 % TD OINT: 1.0000 [in_us] | TOPICAL_OINTMENT | Freq: Four times a day (QID) | TRANSDERMAL | Status: DC | PRN

## 2023-09-29 MED ORDER — POTASSIUM CHLORIDE CRYS ER 20 MEQ PO TBCR
40.0000 meq | EXTENDED_RELEASE_TABLET | Freq: Two times a day (BID) | ORAL | Status: DC
  Administered 2023-09-29 – 2023-10-01 (×5): 40 meq via ORAL
  Filled 2023-09-29 (×5): qty 2

## 2023-09-29 MED ORDER — HEPARIN BOLUS VIA INFUSION
1250.0000 [IU] | Freq: Once | INTRAVENOUS | Status: AC
  Administered 2023-09-29: 1250 [IU] via INTRAVENOUS
  Filled 2023-09-29: qty 1250

## 2023-09-29 MED ORDER — CYCLOBENZAPRINE HCL 10 MG PO TABS
5.0000 mg | ORAL_TABLET | Freq: Three times a day (TID) | ORAL | Status: DC | PRN
  Administered 2023-09-29: 5 mg via ORAL
  Filled 2023-09-29 (×2): qty 1

## 2023-09-29 MED ORDER — POTASSIUM CHLORIDE 20 MEQ PO PACK
40.0000 meq | PACK | Freq: Two times a day (BID) | ORAL | Status: DC
  Administered 2023-09-29: 40 meq via ORAL
  Filled 2023-09-29 (×2): qty 2

## 2023-09-29 MED ORDER — POTASSIUM CHLORIDE CRYS ER 20 MEQ PO TBCR: 40.0000 meq | EXTENDED_RELEASE_TABLET | Freq: Two times a day (BID) | ORAL | Status: DC

## 2023-09-29 NOTE — Progress Notes (Signed)
 Offered for patient to make a password, patient declines to make one at this time.

## 2023-09-29 NOTE — Progress Notes (Signed)
 Rounding Note    Patient Name: Sandra Hines Date of Encounter: 09/29/2023  [facility] Cardiologist: [facility]  Subjective   Lasix held March 7 Respiratory distress starting afternoon of March 7 requiring BiPAP which extended through much of yesterday Treated with Nitropaste, IV Lasix twice yesterday with good urine output -2 L This morning off BiPAP, reports breathing much improved Creatinine yesterday 1.09, today 0.87 Blood pressure stable, Telemetry reviewed maintaining normal sinus rhythm  Inpatient Medications    Scheduled Meds:  amLODipine  10 mg Oral Daily   azithromycin  250 mg Oral Daily   bisoprolol  5 mg Oral BID   buPROPion  150 mg Oral Daily   busPIRone  5 mg Oral TID   Chlorhexidine Gluconate Cloth  6 each Topical Daily   DULoxetine  40 mg Oral Daily   furosemide  40 mg Intravenous Daily   ipratropium-albuterol  3 mL Nebulization Q6H WA   nitroGLYCERIN  1 inch Topical Q6H   rosuvastatin  40 mg Oral Daily   Continuous Infusions:  amiodarone 30 mg/hr (09/29/23 0724)   cefTRIAXone (ROCEPHIN)  IV Stopped (09/28/23 1425)   heparin 1,900 Units/hr (09/29/23 0724)   PRN Meds: acetaminophen, guaiFENesin-codeine, ipratropium-albuterol, LORazepam, magnesium hydroxide, morphine injection, ondansetron (ZOFRAN) IV, traZODone   Vital Signs    Vitals:   09/29/23 0800 09/29/23 0900 09/29/23 0937 09/29/23 1000  BP: 119/73 111/66  130/79  Pulse: 71 73 68 72  Resp: (!) 23 (!) 24 (!) 22 20  Temp: 98.1 F (36.7 C)     TempSrc: Oral     SpO2: 97% 94% 97% 99%  Weight:      Height:        Intake/Output Summary (Last 24 hours) at 09/29/2023 1115 Last data filed at 09/29/2023 0724 Gross per 24 hour  Intake 917.34 ml  Output 2000 ml  Net -1082.66 ml      09/26/2023    1:37 AM 09/25/2023    5:03 PM 09/25/2023    4:04 PM  Last 3 Weights  Weight (lbs) 210 lb 15.7 oz 209 lb 210 lb 4.8 oz  Weight (kg) 95.7 kg 94.802 kg 95.391 kg      Telemetry    Normal sinus  rhythm- Personally Reviewed  ECG     - Personally Reviewed  Physical Exam   Constitutional:  oriented to person, place, and time. No distress.  HENT:  Head: Grossly normal Eyes:  no discharge. No scleral icterus.  Neck: No JVD, no carotid bruits  Cardiovascular: Regular rate and rhythm, no murmurs appreciated Pulmonary/Chest:  coarse breath sounds, scattered Rales Abdominal: Soft.  no distension.  no tenderness.  Musculoskeletal: Normal range of motion Neurological:  normal muscle tone. Coordination normal. No atrophy Skin: Skin warm and dry Psychiatric: normal affect, pleasant   Labs    High Sensitivity Troponin:   Recent Labs  Lab 09/25/23 1711 09/27/23 1326 09/27/23 1440  TROPONINIHS 16 16 16      Chemistry Recent Labs  Lab 09/25/23 1711 09/26/23 0426 09/27/23 1325 09/28/23 0833 09/29/23 0504  NA 136   < > 132* 134* 135  K 3.7   < > 4.3 3.7 3.3*  CL 104   < > 100 99 98  CO2 22   < > 17* 26 29  GLUCOSE 132*   < > 173* 113* 108*  BUN 23*   < > 35* 29* 21*  CREATININE 1.19*   < > 1.52* 1.09* 0.87  CALCIUM 8.7*   < >  8.3* 8.2* 7.8*  MG 2.2  --   --   --   --   GFRNONAA 56*   < > 42* >60 >60  ANIONGAP 10   < > 15 9 8    < > = values in this interval not displayed.    Lipids  Recent Labs  Lab 09/26/23 0426  CHOL 175  TRIG 79  HDL 41  LDLCALC 118*  CHOLHDL 4.3    Hematology Recent Labs  Lab 09/27/23 0259 09/28/23 0416 09/29/23 0505  WBC 16.3* 15.5* 12.0*  RBC 4.44 3.97 3.89  HGB 12.3 11.2* 10.9*  HCT 37.8 34.3* 33.1*  MCV 85.1 86.4 85.1  MCH 27.7 28.2 28.0  MCHC 32.5 32.7 32.9  RDW 14.5 14.6 14.6  PLT 171 138* 124*   Thyroid  Recent Labs  Lab 09/25/23 1711  TSH 1.091  FREET4 1.16*    BNP Recent Labs  Lab 09/25/23 1711  BNP 700.0*    DDimer No results for input(s): "DDIMER" in the last 168 hours.   Radiology    DG Chest Port 1 View Result Date: 09/28/2023 CLINICAL DATA:  [id number] Respiratory distress [id number] EXAM: PORTABLE CHEST 1  VIEW COMPARISON:  September 25, 2023 FINDINGS: The cardiomediastinal silhouette is unchanged and enlarged in contour. Favor small RIGHT pleural effusion. No pneumothorax. Increased diffuse bilateral airspace opacities with vascular indistinctness and peribronchial cuffing. IMPRESSION: 1. Constellation of findings are favored to reflect pulmonary edema, increased. Differential considerations include atypical infection. 2. RIGHT pleural effusion. Electronically Signed   By: Meda Klinefelter M.D.   On: 09/28/2023 08:32    Cardiac Studies   Echo  1. Left ventricular ejection fraction, by estimation, is 45 to 50%. The  left ventricle has mildly decreased function. The left ventricle  demonstrates global hypokinesis. The left ventricular internal cavity size  was mildly dilated. There is mild left  ventricular hypertrophy. Left ventricular diastolic parameters are  indeterminate.   2. Right ventricular systolic function is mildly reduced. The right  ventricular size is normal. There is normal pulmonary artery systolic  pressure. The estimated right ventricular systolic pressure is 25.8 mmHg.   3. Left atrial size was severely dilated.   4. 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 No  evidence of mitral stenosis. The mean mitral valve gradient is 11.0 mmHg.  Moderate mitral annular calcification.   5. Tricuspid valve regurgitation is mild to moderate.   6. The aortic valve is tricuspid. Aortic valve regurgitation is mild. No  aortic stenosis is present.   7. There is borderline dilatation of the ascending aorta, measuring 38  mm.   8. The inferior vena cava is normal in size with greater than 50%  respiratory variability, suggesting right atrial pressure of 3 mmHg.   Patient Profile     Sandra Hines is a 53 year old woman with history of hypertension, cerebral aneurysm status postrepair, smoking, who presents to the hospital for malignant hypertension, atrial fibrillation  with RVR   Assessment & Plan    Atrial fibrillation with RVR Presenting in atrial fibrillation with RVR, difficult to control rate on diltiazem infusion -Started on amiodarone infusion, bisoprolol, also dose of digoxin, converting to normal sinus rhythm -Severe MR with severely dilated left atrium likely contributing -Maintaining normal sinus rhythm past 3 days -Continue bisoprolol, heparin, amiodarone -Consider transitioning to oral amiodarone tomorrow  Viral URI/bronchitis Last 2 weeks with chest congestion, CT chest concerning for possible pneumonia, elevated white count Continued bronchitis symptoms- -on steroids, nebulizers, antibiotics/ceftriaxone  Severe mitral valve regurgitation Rheumatic appearing valve, severely dilated left atrium, likely longstanding issue -Consider repeat limited echo once euvolemic to evaluate mitral valve Suspect there is a dynamic component in the setting of A-fib with CHF and will hope that degree of MR will improve with restoring normal sinus rhythm and diuresis  Acute respiratory distress Secondary to viral URI/bronchitis, CHF in the setting of severe MR, atrial fibrillation with RVR -Continue IV Lasix twice daily with potassium repletion -Steroids nebulizers antibiotics  Anxiety As Ativan as needed Less anxious today   Cannabinoid positive Smoking cessation recommended   For questions or updates, please contact [facility] Please consult [email or web address] for contact info under        Signed, Julien Nordmann, MD  09/29/2023, 11:15 AM

## 2023-09-29 NOTE — Progress Notes (Signed)
 PROGRESS NOTE    Sandra Hines   ZOX:[id number] DOB: Dec 07, 1970  DOA: 09/25/2023 Date of Service: 09/29/23 which is hospital day 4  PCP: Ronnald Ramp, MD    Hospital course / significant events:   HPI: TALEEYA Hines is a 53 y.o. female with medical history significant for essential hypertension and cerebral aneurysm s/p repair, who presented to the emergency room with acute onset of palpitations 03/05 with a feeling of generalized weakness and malaise over the last couple weeks.   03/05: in ED, Afib RVR w/ rate 170s, CXR mild cardiomegaly, vascular congestion, question sequelae from granulomatous disease. In ED, received Ativan, Lopressor IV push, started Cardizem gtt but no response so started on amiodarone bolus/gtt. Cardiology consulted  03/06: requiring BiPap overnight, on HFNC this morning, starting tx for possible pneumonia/bronchitis today w/ abx steroids nebs, cardiology is adjusting medications for Afib, remains high HR but improved into the afternoon. 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 Converted to sinus.  03/07: on  O2 overnight. HR controlled, off dilt this morning, continue amio gtt into tomorrow, cardiology following and anticipate LHC and TEE next week. Holding lasix for now d/t AKI. Back on BiPap this afternoon/evening.  03/08: remaining on BiPap overnight, Cr improved so re-started IV lasix. Cardio to add ntg paste, will try to avoid ntg gtt and art line if able. Diuresing well.  03/09: improved today, off BIPap, good UOP -2L, Cr continues trend down, NSR. Plan to po amiodarone tomorrow.      Consultants:  Cardiology [facility]   Procedures/Surgeries: none      ASSESSMENT & PLAN:   Atrial fibrillation with rapid ventricular response - converted to sinus rhythm   Cardiology following - [facility] IV amiodarone drip --> po tomorrow  CHA2DS2-VASc score is 3, plan for anticoagulation eventually likely  w/ eliquis, on heparin for now    Severe mitral regurgitation  Question undiagnosed rheumatic valvular heart disease or endocarditis (no obvious vegetation) maintain sinus rhythm and blood pressure control Anticipate possible TEE / heart cath per cardiology   Acute CHF (congestive heart failure) HFpEF in setting of severe MR as above  Diuresis IV lasix restarted given improvement in Cr - doing well  Cardiology following  Beta blocker w/ bisoprolol, statin  Anticipate possible TEE / heart cath per cardiology    Acute bronchitis Question CAP No formal COPD dx but possible this is contributing given tobacco hx  Lung sounds improved today as far as rhonchi/wheezing Treating for CAP w/ ceftriaxone + azithromycin Duonebs scheduled + prn  Steroids given hypoxia   Essential hypertension Continue amlodipine 10 mg daily Continue bisoprolol 5 mg bid  Cardiology following - defer changes in meds to their recs   Dyslipidemia statin    Anxiety and depression Continue home Wellbutrin SR, BuSpar, Cymbalta. Benzos prn, will d/c these if there appears to be respiratory depression but pt has tolerated thus far   Class 1 obesity based on BMI: Body mass index is 33.04 kg/m.  Underweight - under 18  overweight - 25 to 29 obese - 30 or more Class 1 obesity: BMI of 30.0 to 34 Class 2 obesity: BMI of 35.0 to 39 Class 3 obesity: BMI of 40.0 to 49 Super Morbid Obesity: BMI 50-59 Super-super Morbid Obesity: BMI 60+ Significantly low or high BMI is associated with higher medical risk.  Weight management advised as adjunct to other disease management and risk reduction treatments    DVT prophylaxis:  heparin IV fluids: no continuous IV fluids  Nutrition: cardiac diet  Central lines / invasive devices: none  Code Status: FULL CODE ACP documentation reviewed:  none on file in [facility]  TOC needs: TBD Barriers to dispo / significant pending items: BiPAP hopefully won't be needed anymore, cardiac  stabilization and workup, will probably get TEE and LHC next week              Subjective / Brief ROS:  Patient reports breathing is a lot better today Denies CP  Family Communication: family at bedside on rounds     Objective Findings:  Vitals:   09/29/23 1100 09/29/23 1200 09/29/23 1203 09/29/23 1300  BP: 114/63 132/89 132/89 126/87  Pulse: 66 70 70 69  Resp: 20 (!) 22 (!) 22 (!) 21  Temp:  98 F (36.7 C)    TempSrc:  Oral    SpO2: 97% 97% 97% 95%  Weight:      Height:        Intake/Output Summary (Last 24 hours) at 09/29/2023 1558 Last data filed at 09/29/2023 1300 Gross per 24 hour  Intake 853.46 ml  Output 2200 ml  Net -1346.54 ml   Filed Weights   09/25/23 1703 09/26/23 0137  Weight: 94.8 kg 95.7 kg    Examination:  Physical Exam Constitutional:      General: She is not in acute distress. Cardiovascular:     Rate and Rhythm: Normal rate and regular rhythm.  Pulmonary:     Effort: No respiratory distress.     Breath sounds: Rales present. No wheezing or rhonchi (mild, scattred coarse breath sounds).  Abdominal:     General: There is no distension.  Musculoskeletal:     Right lower leg: No edema.     Left lower leg: No edema.  Neurological:     General: No focal deficit present.     Mental Status: She is alert.  Psychiatric:        Mood and Affect: Affect normal. Mood is anxious.          Scheduled Medications:   amLODipine  10 mg Oral Daily   azithromycin  250 mg Oral Daily   bisoprolol  5 mg Oral BID   buPROPion  150 mg Oral Daily   busPIRone  5 mg Oral TID   Chlorhexidine Gluconate Cloth  6 each Topical Daily   DULoxetine  40 mg Oral Daily   furosemide  40 mg Intravenous BID   ipratropium-albuterol  3 mL Nebulization BID   potassium chloride  40 mEq Oral BID   rosuvastatin  40 mg Oral Daily    Continuous Infusions:  amiodarone 30 mg/hr (09/29/23 1300)   cefTRIAXone (ROCEPHIN)  IV Stopped (09/29/23 1236)   heparin 2,050  Units/hr (09/29/23 1300)    PRN Medications:  acetaminophen, cyclobenzaprine, guaiFENesin-codeine, ipratropium-albuterol, LORazepam, magnesium hydroxide, morphine injection, nitroGLYCERIN, ondansetron (ZOFRAN) IV, traZODone  Antimicrobials from admission:  Anti-infectives (From admission, onward)    Start     Dose/Rate Route Frequency Ordered Stop   09/29/23 1200  azithromycin (ZITHROMAX) tablet 250 mg        250 mg Oral Daily 09/28/23 1427 10/01/23 1159   09/27/23 1200  azithromycin (ZITHROMAX) 250 mg in dextrose 5 % 125 mL IVPB  Status:  Discontinued        250 mg 127.5 mL/hr over 60 Minutes Intravenous Every 24 hours 09/26/23 1123 09/28/23 1427   09/26/23 1230  cefTRIAXone (ROCEPHIN) 2 g in sodium chloride 0.9 %  100 mL IVPB        2 g 200 mL/hr over 30 Minutes Intravenous Every 24 hours 09/26/23 1123     09/26/23 1230  azithromycin (ZITHROMAX) 500 mg in sodium chloride 0.9 % 250 mL IVPB        500 mg 250 mL/hr over 60 Minutes Intravenous  Once 09/26/23 1123 09/26/23 1753           Data Reviewed:  I have personally reviewed the following...  CBC: Recent Labs  Lab 09/25/23 1711 09/26/23 0426 09/27/23 0259 09/28/23 0416 09/29/23 0505  WBC 13.9* 12.8* 16.3* 15.5* 12.0*  NEUTROABS 7.2  --   --   --   --   HGB 12.1 12.3 12.3 11.2* 10.9*  HCT 37.7 37.9 37.8 34.3* 33.1*  MCV 88.7 87.1 85.1 86.4 85.1  PLT 234 215 171 138* 124*   Basic Metabolic Panel: Recent Labs  Lab 09/25/23 1711 09/26/23 0426 09/27/23 0259 09/27/23 1325 09/28/23 0833 09/29/23 0504  NA 136 137 133* 132* 134* 135  K 3.7 3.7 4.2 4.3 3.7 3.3*  CL 104 104 98 100 99 98  CO2 22 21* 21* 17* 26 29  GLUCOSE 132* 151* 251* 173* 113* 108*  BUN 23* 22* 30* 35* 29* 21*  CREATININE 1.19* 0.95 1.45* 1.52* 1.09* 0.87  CALCIUM 8.7* 8.5* 8.6* 8.3* 8.2* 7.8*  MG 2.2  --   --   --   --   --    GFR: Estimated Creatinine Clearance: 91.8 mL/min (by C-G formula based on SCr of 0.87 mg/dL). Liver Function  Tests: No results for input(s): "AST", "ALT", "ALKPHOS", "BILITOT", "PROT", "ALBUMIN" in the last 168 hours. No results for input(s): "LIPASE", "AMYLASE" in the last 168 hours. No results for input(s): "AMMONIA" in the last 168 hours. Coagulation Profile: Recent Labs  Lab 09/26/23 0426  INR 1.2   Cardiac Enzymes: No results for input(s): "CKTOTAL", "CKMB", "CKMBINDEX", "TROPONINI" in the last 168 hours. BNP (last 3 results) No results for input(s): "PROBNP" in the last 8760 hours. HbA1C: No results for input(s): "HGBA1C" in the last 72 hours. CBG: Recent Labs  Lab 09/26/23 0131  GLUCAP 140*   Lipid Profile: No results for input(s): "CHOL", "HDL", "LDLCALC", "TRIG", "CHOLHDL", "LDLDIRECT" in the last 72 hours.  Thyroid Function Tests: No results for input(s): "TSH", "T4TOTAL", "FREET4", "T3FREE", "THYROIDAB" in the last 72 hours.  Anemia Panel: No results for input(s): "VITAMINB12", "FOLATE", "FERRITIN", "TIBC", "IRON", "RETICCTPCT" in the last 72 hours. Most Recent Urinalysis On File:     Component Value Date/Time   COLORURINE YELLOW (A) 11/26/2020 0041   APPEARANCEUR CLEAR (A) 11/26/2020 0041   APPEARANCEUR Cloudy 08/15/2014 0519   LABSPEC 1.026 11/26/2020 0041   LABSPEC 1.017 08/15/2014 0519   PHURINE 5.0 11/26/2020 0041   GLUCOSEU NEGATIVE 11/26/2020 0041   GLUCOSEU Negative 08/15/2014 0519   HGBUR NEGATIVE 11/26/2020 0041   BILIRUBINUR NEGATIVE 11/26/2020 0041   BILIRUBINUR Negative 08/15/2014 0519   KETONESUR NEGATIVE 11/26/2020 0041   PROTEINUR NEGATIVE 11/26/2020 0041   NITRITE NEGATIVE 11/26/2020 0041   LEUKOCYTESUR NEGATIVE 11/26/2020 0041   LEUKOCYTESUR Negative 08/15/2014 0519   Sepsis Labs: @LABRCNTIP (procalcitonin:4,lacticidven:4) Microbiology: Recent Results (from the past 240 hours)  Resp panel by RT-PCR (RSV, Flu A&B, Covid) Anterior Nasal Swab     Status: None   Collection Time: 09/25/23  5:22 PM   Specimen: Anterior Nasal Swab  Result Value  Ref Range Status   SARS Coronavirus 2 by RT PCR NEGATIVE NEGATIVE Final  Comment: (NOTE) SARS-CoV-2 target nucleic acids are NOT DETECTED.  The SARS-CoV-2 RNA is generally detectable in upper respiratory specimens during the acute phase of infection. The lowest concentration of SARS-CoV-2 viral copies this assay can detect is 138 copies/mL. A negative result does not preclude SARS-Cov-2 infection and should not be used as the sole basis for treatment or other patient management decisions. A negative result may occur with  improper specimen collection/handling, submission of specimen other than nasopharyngeal swab, presence of viral mutation(s) within the areas targeted by this assay, and inadequate number of viral copies(<138 copies/mL). A negative result must be combined with clinical observations, patient history, and epidemiological information. The expected result is Negative.  Fact Sheet for Patients:  BloggerCourse.com  Fact Sheet for Healthcare Providers:  SeriousBroker.it  This test is no t yet approved or cleared by the [place] FDA and  has been authorized for detection and/or diagnosis of SARS-CoV-2 by FDA under an Emergency Use Authorization (EUA). This EUA will remain  in effect (meaning this test can be used) for the duration of the COVID-19 declaration under Section 564(b)(1) of the Act, 21 U.S.C.section 360bbb-3(b)(1), unless the authorization is terminated  or revoked sooner.       Influenza A by PCR NEGATIVE NEGATIVE Final   Influenza B by PCR NEGATIVE NEGATIVE Final    Comment: (NOTE) The Xpert Xpress SARS-CoV-2/FLU/RSV plus assay is intended as an aid in the diagnosis of influenza from Nasopharyngeal swab specimens and should not be used as a sole basis for treatment. Nasal washings and aspirates are unacceptable for Xpert Xpress SARS-CoV-2/FLU/RSV testing.  Fact Sheet for  Patients: BloggerCourse.com  Fact Sheet for Healthcare Providers: SeriousBroker.it  This test is not yet approved or cleared by the [place] FDA and has been authorized for detection and/or diagnosis of SARS-CoV-2 by FDA under an Emergency Use Authorization (EUA). This EUA will remain in effect (meaning this test can be used) for the duration of the COVID-19 declaration under Section 564(b)(1) of the Act, 21 U.S.C. section 360bbb-3(b)(1), unless the authorization is terminated or revoked.     Resp Syncytial Virus by PCR NEGATIVE NEGATIVE Final    Comment: (NOTE) Fact Sheet for Patients: BloggerCourse.com  Fact Sheet for Healthcare Providers: SeriousBroker.it  This test is not yet approved or cleared by the [place] FDA and has been authorized for detection and/or diagnosis of SARS-CoV-2 by FDA under an Emergency Use Authorization (EUA). This EUA will remain in effect (meaning this test can be used) for the duration of the COVID-19 declaration under Section 564(b)(1) of the Act, 21 U.S.C. section 360bbb-3(b)(1), unless the authorization is terminated or revoked.  Performed at [facility], [address] [place], [place] [address]   MRSA Next Gen by PCR, Nasal     Status: None   Collection Time: 09/26/23  1:39 AM   Specimen: Nasal Mucosa; Nasal Swab  Result Value Ref Range Status   MRSA by PCR Next Gen NOT DETECTED NOT DETECTED Final    Comment: (NOTE) The GeneXpert MRSA Assay (FDA approved for NASAL specimens only), is one component of a comprehensive MRSA colonization surveillance program. It is not intended to diagnose MRSA infection nor to guide or monitor treatment for MRSA infections. Test performance is not FDA approved in patients less than 77 years old. Performed at [facility], [address] [place], [place] [address]   Culture,  blood (Routine X 2) w Reflex to ID Panel     Status: None (Preliminary  result)   Collection Time: 09/26/23  3:31 AM   Specimen: BLOOD  Result Value Ref Range Status   Specimen Description BLOOD BLOOD LEFT HAND  Final   Special Requests   Final    BOTTLES DRAWN AEROBIC AND ANAEROBIC Blood Culture adequate volume   Culture   Final    NO GROWTH 3 DAYS Performed at [facility], [address], [place], [place] [address]    Report Status PENDING  Incomplete  Culture, blood (Routine X 2) w Reflex to ID Panel     Status: None (Preliminary result)   Collection Time: 09/26/23  3:40 AM   Specimen: BLOOD  Result Value Ref Range Status   Specimen Description BLOOD BLOOD RIGHT HAND  Final   Special Requests   Final    BOTTLES DRAWN AEROBIC AND ANAEROBIC Blood Culture adequate volume   Culture   Final    NO GROWTH 3 DAYS Performed at [facility], [address], [place], [place] [address]    Report Status PENDING  Incomplete      Radiology Studies last 3 days: DG Chest Port 1 View Result Date: 09/28/2023 CLINICAL DATA:  [id number] Respiratory distress [id number] EXAM: PORTABLE CHEST 1 VIEW COMPARISON:  September 25, 2023 FINDINGS: The cardiomediastinal silhouette is unchanged and enlarged in contour. Favor small RIGHT pleural effusion. No pneumothorax. Increased diffuse bilateral airspace opacities with vascular indistinctness and peribronchial cuffing. IMPRESSION: 1. Constellation of findings are favored to reflect pulmonary edema, increased. Differential considerations include atypical infection. 2. RIGHT pleural effusion. Electronically Signed   By: Meda Klinefelter M.D.   On: 09/28/2023 08:32   ECHOCARDIOGRAM COMPLETE Result Date: 09/27/2023    ECHOCARDIOGRAM REPORT   Patient Name:   BREELLE HOLLYWOOD Date of Exam: 09/26/2023 Medical Rec #:  [id number]    Height:       67.0 in Accession #:    [id number]   Weight:       211.0 lb Date of Birth:  04/29/1971   BSA:          2.069 m Patient Age:     52 years     BP:           150/137 mmHg Patient Gender: F            HR:           118 bpm. Exam Location:  [facility] Procedure: 2D Echo, Cardiac Doppler and Color Doppler (Both Spectral and Color            Flow Doppler were utilized during procedure). Indications:     Atrial Fibrillation  History:         Patient has no prior history of Echocardiogram examinations.                  CHF, Arrythmias:Atrial Fibrillation; Risk Factors:Hypertension,                  Dyslipidemia and Current Smoker.  Sonographer:     Mikki Harbor Referring Phys:  [id number] JAN A MANSY Diagnosing Phys: Julien Nordmann MD IMPRESSIONS  1. Left ventricular ejection fraction, by estimation, is 45 to 50%. The left ventricle has mildly decreased function. The left ventricle demonstrates global hypokinesis. The left ventricular internal cavity size was mildly dilated. There is mild left ventricular hypertrophy. Left ventricular diastolic parameters are indeterminate.  2. Right ventricular systolic function is mildly reduced. The right ventricular size is normal. There is normal pulmonary artery systolic pressure. The estimated  right ventricular systolic pressure is 25.8 mmHg.  3. Left atrial size was severely dilated.  4. 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 No evidence of mitral stenosis. The mean mitral valve gradient is 11.0 mmHg. Moderate mitral annular calcification.  5. Tricuspid valve regurgitation is mild to moderate.  6. The aortic valve is tricuspid. Aortic valve regurgitation is mild. No aortic stenosis is present.  7. There is borderline dilatation of the ascending aorta, measuring 38 mm.  8. The inferior vena cava is normal in size with greater than 50% respiratory variability, suggesting right atrial pressure of 3 mmHg. FINDINGS  Left Ventricle: Left ventricular ejection fraction, by estimation, is 45 to 50%. The left ventricle has mildly decreased function. The left ventricle demonstrates global  hypokinesis. Strain was performed and the global longitudinal strain is indeterminate. The left ventricular internal cavity size was mildly dilated. There is mild left ventricular hypertrophy. Left ventricular diastolic parameters are indeterminate. Right Ventricle: The right ventricular size is normal. No increase in right ventricular wall thickness. Right ventricular systolic function is mildly reduced. There is normal pulmonary artery systolic pressure. The tricuspid regurgitant velocity is 2.39 m/s, and with an assumed right atrial pressure of 3 mmHg, the estimated right ventricular systolic pressure is 25.8 mmHg. Left Atrium: Left atrial size was severely dilated. Right Atrium: Right atrial size was normal in size. Pericardium: There is no evidence of pericardial effusion. Mitral Valve: The mitral valve is rheumatic. There is moderate thickening of the mitral valve leaflet(s). There is mild calcification of the mitral valve leaflet(s). Moderate mitral annular calcification. Severe mitral valve regurgitation. No evidence of  mitral valve stenosis. MV peak gradient, 19.2 mmHg. The mean mitral valve gradient is 11.0 mmHg. Tricuspid Valve: The tricuspid valve is normal in structure. Tricuspid valve regurgitation is mild to moderate. No evidence of tricuspid stenosis. Aortic Valve: The aortic valve is tricuspid. Aortic valve regurgitation is mild. No aortic stenosis is present. Aortic valve mean gradient measures 6.0 mmHg. Aortic valve peak gradient measures 11.2 mmHg. Aortic valve area, by VTI measures 3.69 cm. Pulmonic Valve: The pulmonic valve was normal in structure. Pulmonic valve regurgitation is not visualized. No evidence of pulmonic stenosis. Aorta: The aortic root is normal in size and structure. There is borderline dilatation of the ascending aorta, measuring 38 mm. Venous: The inferior vena cava is normal in size with greater than 50% respiratory variability, suggesting right atrial pressure of 3 mmHg.  IAS/Shunts: No atrial level shunt detected by color flow Doppler. Additional Comments: 3D was performed not requiring image post processing on an independent workstation and was indeterminate.  LEFT VENTRICLE PLAX 2D LVIDd:         5.80 cm LVIDs:         4.20 cm LV PW:         1.30 cm LV IVS:        1.20 cm LVOT diam:     2.30 cm LV SV:         94 LV SV Index:   45 LVOT Area:     4.15 cm  RIGHT VENTRICLE RV Basal diam:  3.85 cm RV Mid diam:    2.90 cm RV S prime:     14.90 cm/s LEFT ATRIUM              Index        RIGHT ATRIUM           Index LA diam:  4.70 cm  2.27 cm/m   RA Area:     19.00 cm LA Vol (A2C):   125.0 ml 60.42 ml/m  RA Volume:   57.00 ml  27.55 ml/m LA Vol (A4C):   84.3 ml  40.75 ml/m LA Biplane Vol: 107.0 ml 51.72 ml/m  AORTIC VALVE                     PULMONIC VALVE AV Area (Vmax):    3.34 cm      PV Vmax:       0.98 m/s AV Area (Vmean):   3.14 cm      PV Peak grad:  3.9 mmHg AV Area (VTI):     3.69 cm AV Vmax:           167.33 cm/s AV Vmean:          113.267 cm/s AV VTI:            0.254 m AV Peak Grad:      11.2 mmHg AV Mean Grad:      6.0 mmHg LVOT Vmax:         134.50 cm/s LVOT Vmean:        85.500 cm/s LVOT VTI:          0.226 m LVOT/AV VTI ratio: 0.89  AORTA Ao Root diam: 3.60 cm Ao Asc diam:  3.80 cm MITRAL VALVE                  TRICUSPID VALVE MV Area (PHT): 3.21 cm       TR Peak grad:   22.8 mmHg MV Area VTI:   2.97 cm       TR Vmax:        239.00 cm/s MV Peak grad:  19.2 mmHg MV Mean grad:  11.0 mmHg      SHUNTS MV Vmax:       2.19 m/s       Systemic VTI:  0.23 m MV Vmean:      159.0 cm/s     Systemic Diam: 2.30 cm MV Decel Time: 236 msec MR Peak grad:    112.6 mmHg MR Mean grad:    72.0 mmHg MR Vmax:         530.50 cm/s MR Vmean:        396.5 cm/s MR PISA:         9.05 cm MR PISA Eff ROA: 144 mm MR PISA Radius:  1.20 cm MV E velocity: 161.00 cm/s Julien Nordmann MD Electronically signed by Julien Nordmann MD Signature Date/Time: 09/26/2023/3:43:45 PM    Final (Updated)     CT Angio Chest Pulmonary Embolism (PE) W or WO Contrast Result Date: 09/26/2023 CLINICAL DATA:  New onset atrial fib with RVR, not feeling well the past few days, with shortness of breath. EXAM: CT ANGIOGRAPHY CHEST WITH CONTRAST TECHNIQUE: Multidetector CT imaging of the chest was performed using the standard protocol during bolus administration of intravenous contrast. Multiplanar CT image reconstructions and MIPs were obtained to evaluate the vascular anatomy. RADIATION DOSE REDUCTION: This exam was performed according to the departmental dose-optimization program which includes automated exposure control, adjustment of the mA and/or kV according to patient size and/or use of iterative reconstruction technique. CONTRAST:  OMNIPAQUE IOHEXOL 350 MG/ML SOLN COMPARISON:  Portable chest yesterday, chest radiograph 08/23/2015, and chest CT with contrast both 08/23/2015. FINDINGS: Cardiovascular: Since 2017, increased cardiomegaly with left chamber predominance. There is an enlarged pulmonary trunk measuring  3.6 cm indicating arterial hypertension, previously was 2.4 cm. Also noted is reflux of contrast into the IVC and hepatic veins which may suggest right heart dysfunction or tricuspid regurgitation. There is no increased RV/LV ratio and no embolic arterial filling defect is seen. Central pulmonary veins are prominent. There is mild aortic atherosclerosis. Aortic opacification is insufficient to assess the lumen. The great vessels branch normally. Slight aneurysmal prominence is seen in the mid ascending segment which is 4.3 cm on 7:49 burden the aortic root measures 4 cm at the sinuses of Valsalva on 7:52. There is no substantial pericardial effusion. There are no visible coronary calcifications. Mediastinum/Nodes: Multiple calcified bihilar and mediastinal lymph nodes are again shown. There is a 1.7 cm hypodense nodule posteriorly in the right lobe of the thyroid gland. This was not seen previously.  Nonemergent follow-up ultrasound is recommended. Axillary spaces are clear. No noncalcified adenopathy is seen in the chest. There are retained secretions versus aspirate in the thoracic trachea. The main bronchi are clear.  The thoracic esophagus is unremarkable. Lungs/Pleura: There is interlobular septal thickening with a basal gradient in the lungs consistent with interstitial edema, moderate. Findings likely due to CHF or fluid overload. There is patchy haziness in the posterior lower lobes which most likely ground-glass edema. Diffuse bronchial thickening is seen and could be congestive, due to bronchitis or reactive airway disease. There are scattered calcified granulomas in both lungs. There is a stable 8 x 7 mm left lower lobe lateral basal nodule on 5:121, stable 4 mm left lower lobe pleural-based nodule laterally on 5:106. Bones are consistent with benign nodules given the length of stability. There is patchy dense peripheral airspace disease in the right upper lobe, a small amount in the right lower lobe perihilar area. This could be due to asymmetric airspace edema or superimposed pneumonia. Upper Abdomen: There are calcified splenic granulomas, occasional calcified hepatic granulomas. No calcified gallstones are seen. There is pericholecystic edema, in this case probably due to congestive edema if there are no localizing symptoms. If there are localizing symptoms, ultrasound may be helpful for further study. No other significant upper abdominal findings. Abdominal aortic atherosclerosis. Musculoskeletal: Degenerative change and mild kyphosis thoracic spine. No acute or other significant osseous findings. No mass in the visualized chest wall. Review of the MIP images confirms the above findings. IMPRESSION: 1. No evidence of arterial embolus. 2. Increased cardiomegaly with left chamber predominance since 2017. 3. Enlarged pulmonary trunk 3.6 cm indicating arterial hypertension. 4. Reflux of contrast into  the IVC and hepatic veins which may suggest right heart dysfunction or tricuspid regurgitation. 5. 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 Recommend annual imaging followup by CTA or MRA. This recommendation follows 2010 ACCF/AHA/AATS/ACR/ASA/SCA/SCAI/SIR/STS/ SVM Guidelines for the Diagnosis and Management of Patients with Thoracic Aortic Disease. Circulation. 2010; 121: Z610-R604. Aortic aneurysm NOS (ICD10-I71.9). 6. Aortic atherosclerosis. 7. Interstitial edema with a basal gradient in the lungs, with patchy haziness in the posterior lower lobes most likely ground-glass edema. Findings consistent with CHF or fluid overload. 8. Diffuse bronchial thickening which could be congestive, due to bronchitis or reactive airway disease. 9. Patchy dense peripheral airspace disease in the right upper lobe, a small amount in the right lower lobe perihilar area. This could be due to asymmetric airspace edema or superimposed pneumonia. 10. Pericholecystic edema, probably due to congestive edema if there are no localizing symptoms. If there are localizing symptoms, ultrasound may be helpful for further study. 11. 1.7 cm hypodense nodule posteriorly in the  right lobe of the thyroid gland. Nonemergent follow-up ultrasound is recommended. 12. Stable left lower lobe nodules. 13. Retained secretions versus aspirate in the thoracic trachea. Aortic Atherosclerosis (ICD10-I70.0). Electronically Signed   By: Almira Bar M.D.   On: 09/26/2023 02:46   DG Chest Portable 1 View Result Date: 09/25/2023 CLINICAL DATA:  Cough and atrial fibrillation. EXAM: PORTABLE CHEST 1 VIEW COMPARISON:  August 23, 2015 FINDINGS: The cardiac silhouette is mildly enlarged. Mild prominence of the central pulmonary vasculature is seen. Ill-defined subcentimeter calcified pulmonary nodules are seen scattered throughout both lungs. No focal consolidation, pleural effusion or pneumothorax is identified. The visualized skeletal structures are unremarkable.  IMPRESSION: 1. Mild cardiomegaly with mild central pulmonary vascular congestion. 2. Findings likely consistent with sequelae associated with prior granulomatous disease. Electronically Signed   By: Aram Candela M.D.   On: 09/25/2023 20:20       Time spent: 50 min     Sunnie Nielsen, DO [facility] 09/29/2023, 3:58 PM    Dictation software may have been used to generate the above note. Typos may occur and escape review in typed/dictated notes. Please contact Dr Lyn Hollingshead directly for clarity if needed.  Staff may message me via secure chat in Epic  but this may not receive an immediate response,  please page me for urgent matters!  If 7PM-7AM, please contact night coverage [email or web address]

## 2023-09-29 NOTE — Progress Notes (Signed)
 PHARMACY - ANTICOAGULATION CONSULT NOTE  Pharmacy Consult for Heparin Infusion Indication: atrial fibrillation  Allergies  Allergen Reactions   Penicillins Hives    Has patient had a PCN reaction causing immediate rash, facial/tongue/throat swelling, SOB or lightheadedness with hypotension: No Has patient had a PCN reaction causing severe rash involving mucus membranes or skin necrosis: No Has patient had a PCN reaction that required hospitalization: No Has patient had a PCN reaction occurring within the last 10 years: No If all of the above answers are "NO", then may proceed with Cephalosporin use.    Patient Measurements: Height: 5\' 7"  (170.2 cm) Weight: 95.7 kg (210 lb 15.7 oz) IBW/kg (Calculated) : 61.6 Heparin Dosing Weight: 82.6 kg  Vital Signs: Temp: 98.3 F (36.8 C) (03/09 1600) Temp Source: Oral (03/09 1600) BP: 105/74 (03/09 1600) Pulse Rate: 67 (03/09 1600)  Labs: Recent Labs    09/27/23 0259 09/27/23 0836 09/27/23 1325 09/27/23 1326 09/27/23 1440 09/28/23 0416 09/28/23 0833 09/28/23 1324 09/29/23 0504 09/29/23 0505 09/29/23 1115 09/29/23 1757  HGB 12.3  --   --   --   --  11.2*  --   --   --  10.9*  --   --   HCT 37.8  --   --   --   --  34.3*  --   --   --  33.1*  --   --   PLT 171  --   --   --   --  138*  --   --   --  124*  --   --   HEPARINUNFRC 0.60   < >  --   --   --  0.24*  --    < >  --  0.32 0.24* 0.36  CREATININE 1.45*  --  1.52*  --   --   --  1.09*  --  0.87  --   --   --   TROPONINIHS  --   --   --  16 16  --   --   --   --   --   --   --    < > = values in this interval not displayed.   Estimated Creatinine Clearance: 91.8 mL/min (by C-G formula based on SCr of 0.87 mg/dL).  Medical History: Past Medical History:  Diagnosis Date   Hypertension    Medications:  Scheduled:   amLODipine  10 mg Oral Daily   azithromycin  250 mg Oral Daily   bisoprolol  5 mg Oral BID   buPROPion  150 mg Oral Daily   busPIRone  5 mg Oral TID    Chlorhexidine Gluconate Cloth  6 each Topical Daily   DULoxetine  40 mg Oral Daily   furosemide  40 mg Intravenous BID   ipratropium-albuterol  3 mL Nebulization BID   potassium chloride  40 mEq Oral BID   rosuvastatin  40 mg Oral Daily   Assessment: Sandra Hines is a 53 y.o. female presenting with palpitations. PMH significant for AVM s/p repair, TUD, HTN. Patient was not on [facility] PTA per chart review. Patient with newly diagnosed atrial fibrillation and a CHA2DS2-VASc score of at least 3 (age, hypertension, and heart failure). Cardiology planning to transition to [facility] once it is clear that no invasive procedures will need to be done this admission. Pharmacy has been consulted to initiate and manage heparin infusion.   Baseline Labs: PT 15.0, INR 1.2, Hgb 12.3, Hct 37.9, Plt 215  Goal of Therapy:  Heparin level 0.3-0.7 units/ml Monitor platelets by anticoagulation protocol: Yes   Date Time HL Rate/Comment  3/6 1745 < 0.10 1300/subtherapeutic 3/7 0259 0.60 1550/therapeutic x1 3/7 0836 0.54 1550/therapeutic x2  3/8 0416 0.24 1550/subtherapeutic  3/8 1324 0.33 1750/therapeutic x1  3/8 1912 0.24 1750/subtherapeutic 3/9 0505 0.32 1900/therapeutic x 1 3/9 1115 0.24 1900/subtherapeutic 3/9 1757 0.36 2050/therapeutic x1  Plan: Continue heparin infusion at 2050 units/hr Check HL in 6 hours  Continue to monitor H&H and platelets daily while on heparin infusion   Celene Squibb, PharmD Clinical Pharmacist 09/29/2023 6:38 PM

## 2023-09-29 NOTE — Progress Notes (Signed)
 PHARMACY - ANTICOAGULATION CONSULT NOTE  Pharmacy Consult for Heparin Infusion Indication: atrial fibrillation  Allergies  Allergen Reactions   Penicillins Hives    Has patient had a PCN reaction causing immediate rash, facial/tongue/throat swelling, SOB or lightheadedness with hypotension: No Has patient had a PCN reaction causing severe rash involving mucus membranes or skin necrosis: No Has patient had a PCN reaction that required hospitalization: No Has patient had a PCN reaction occurring within the last 10 years: No If all of the above answers are "NO", then may proceed with Cephalosporin use.    Patient Measurements: Height: 5\' 7"  (170.2 cm) Weight: 95.7 kg (210 lb 15.7 oz) IBW/kg (Calculated) : 61.6 Heparin Dosing Weight: 82.6 kg  Vital Signs: Temp: 98.1 F (36.7 C) (03/09 0800) Temp Source: Oral (03/09 0800) BP: 130/79 (03/09 1000) Pulse Rate: 72 (03/09 1000)  Labs: Recent Labs    09/27/23 0259 09/27/23 0836 09/27/23 1325 09/27/23 1326 09/27/23 1440 09/28/23 0416 09/28/23 0833 09/28/23 1324 09/28/23 1912 09/29/23 0504 09/29/23 0505 09/29/23 1115  HGB 12.3  --   --   --   --  11.2*  --   --   --   --  10.9*  --   HCT 37.8  --   --   --   --  34.3*  --   --   --   --  33.1*  --   PLT 171  --   --   --   --  138*  --   --   --   --  124*  --   HEPARINUNFRC 0.60   < >  --   --   --  0.24*  --    < > 0.24*  --  0.32 0.24*  CREATININE 1.45*  --  1.52*  --   --   --  1.09*  --   --  0.87  --   --   TROPONINIHS  --   --   --  16 16  --   --   --   --   --   --   --    < > = values in this interval not displayed.   Estimated Creatinine Clearance: 91.8 mL/min (by C-G formula based on SCr of 0.87 mg/dL).  Medical History: Past Medical History:  Diagnosis Date   Hypertension    Medications:  Scheduled:   amLODipine  10 mg Oral Daily   azithromycin  250 mg Oral Daily   bisoprolol  5 mg Oral BID   buPROPion  150 mg Oral Daily   busPIRone  5 mg Oral TID    Chlorhexidine Gluconate Cloth  6 each Topical Daily   DULoxetine  40 mg Oral Daily   furosemide  40 mg Intravenous BID   heparin  1,250 Units Intravenous Once   ipratropium-albuterol  3 mL Nebulization BID   nitroGLYCERIN  1 inch Topical Q6H   potassium chloride  40 mEq Oral BID   rosuvastatin  40 mg Oral Daily   Assessment: Sandra Hines is a 53 y.o. female presenting with palpitations. PMH significant for AVM s/p repair, TUD, HTN. Patient was not on [facility] PTA per chart review. Patient with newly diagnosed atrial fibrillation and a CHA2DS2-VASc score of at least 3 (age, hypertension, and heart failure). Cardiology planning to transition to [facility] once it is clear that no invasive procedures will need to be done this admission. Pharmacy has been consulted to initiate and manage heparin  infusion.   Baseline Labs: PT 15.0, INR 1.2, Hgb 12.3, Hct 37.9, Plt 215   Goal of Therapy:  Heparin level 0.3-0.7 units/ml Monitor platelets by anticoagulation protocol: Yes   Date Time HL Rate/Comment  3/6 1745 < 0.10 1300/subtherapeutic 3/7 0259 0.60 1550/therapeutic x1 3/7 0836 0.54 1550/therapeutic x2  3/8 0416 0.24 1550/subtherapeutic  3/8 1324 0.33 1750/therapeutic x1  3/8 1912 0.24 1750/subtherapeutic 3/9 0505 0.32 1900/therapeutic x 1 3/9 1115 0.24 1900/subtherapeutic  Plan: Bolus 1250 units x 1 Increase heparin infusion to 2050 units/hr Recheck HL in 6 hours after rate change Continue to monitor H&H and platelets daily while on heparin infusion   Bettey Costa, PharmD Clinical Pharmacist 09/29/2023 12:22 PM

## 2023-09-29 NOTE — Progress Notes (Signed)
 PHARMACY - ANTICOAGULATION CONSULT NOTE  Pharmacy Consult for Heparin Infusion Indication: atrial fibrillation  Allergies  Allergen Reactions   Penicillins Hives    Has patient had a PCN reaction causing immediate rash, facial/tongue/throat swelling, SOB or lightheadedness with hypotension: No Has patient had a PCN reaction causing severe rash involving mucus membranes or skin necrosis: No Has patient had a PCN reaction that required hospitalization: No Has patient had a PCN reaction occurring within the last 10 years: No If all of the above answers are "NO", then may proceed with Cephalosporin use.    Patient Measurements: Height: 5\' 7"  (170.2 cm) Weight: 95.7 kg (210 lb 15.7 oz) IBW/kg (Calculated) : 61.6 Heparin Dosing Weight: 82.6 kg  Vital Signs: Temp: 98.2 F (36.8 C) (03/09 0400) Temp Source: Oral (03/09 0400) BP: 111/82 (03/09 0308) Pulse Rate: 69 (03/09 0308)  Labs: Recent Labs    09/27/23 0259 09/27/23 0836 09/27/23 1325 09/27/23 1326 09/27/23 1440 09/28/23 0416 09/28/23 0833 09/28/23 1324 09/28/23 1912 09/29/23 0505  HGB 12.3  --   --   --   --  11.2*  --   --   --  10.9*  HCT 37.8  --   --   --   --  34.3*  --   --   --  33.1*  PLT 171  --   --   --   --  138*  --   --   --  124*  HEPARINUNFRC 0.60   < >  --   --   --  0.24*  --  0.33 0.24* 0.32  CREATININE 1.45*  --  1.52*  --   --   --  1.09*  --   --   --   TROPONINIHS  --   --   --  16 16  --   --   --   --   --    < > = values in this interval not displayed.   Estimated Creatinine Clearance: 73.3 mL/min (A) (by C-G formula based on SCr of 1.09 mg/dL (H)).  Medical History: Past Medical History:  Diagnosis Date   Hypertension    Medications:  Scheduled:   amLODipine  10 mg Oral Daily   azithromycin  250 mg Oral Daily   bisoprolol  5 mg Oral BID   buPROPion  150 mg Oral Daily   busPIRone  5 mg Oral TID   Chlorhexidine Gluconate Cloth  6 each Topical Daily   DULoxetine  40 mg Oral Daily    furosemide  40 mg Intravenous Daily   ipratropium-albuterol  3 mL Nebulization Q6H WA   nitroGLYCERIN  1 inch Topical Q6H   rosuvastatin  40 mg Oral Daily   Assessment: Sandra Hines is a 53 y.o. female presenting with palpitations. PMH significant for AVM s/p repair, TUD, HTN. Patient was not on [facility] PTA per chart review. Patient with newly diagnosed atrial fibrillation and a CHA2DS2-VASc score of at least 3 (age, hypertension, and heart failure). Cardiology planning to transition to [facility] once it is clear that no invasive procedures will need to be done this admission. Pharmacy has been consulted to initiate and manage heparin infusion.   Baseline Labs: PT 15.0, INR 1.2, Hgb 12.3, Hct 37.9, Plt 215   Goal of Therapy:  Heparin level 0.3-0.7 units/ml Monitor platelets by anticoagulation protocol: Yes   Date Time HL Rate/Comment  3/6 1745 < 0.10 1300/subtherapeutic 3/7 0259 0.60 1550/therapeutic x1 3/7 0836 0.54 1550/therapeutic x2  3/8 0416 0.24 1550/subtherapeutic  3/8 1324 0.33 1750/therapeutic x1  3/8 1912 0.24 1750/subtherapeutic 3/9 0505 0.32 1900/therapeutic x 1  Plan: Continue heparin infusion at 1900 units/hr Recheck HL in 6 hours to confirm  Continue to monitor H&H and platelets daily while on heparin infusion   Otelia Sergeant, PharmD, [facility] 09/29/2023 5:29 AM

## 2023-09-29 NOTE — Plan of Care (Signed)
 Continuing with plan of care.

## 2023-09-29 NOTE — Plan of Care (Signed)
  Problem: Education: Goal: Knowledge of disease or condition will improve Outcome: Progressing   Problem: Cardiac: Goal: Ability to achieve and maintain adequate cardiopulmonary perfusion will improve Outcome: Progressing   Problem: Education: Goal: Knowledge of General Education information will improve Description: Including pain rating scale, medication(s)/side effects and non-pharmacologic comfort measures Outcome: Progressing   Problem: Clinical Measurements: Goal: Ability to maintain clinical measurements within normal limits will improve Outcome: Progressing Goal: Will remain free from infection Outcome: Progressing Goal: Diagnostic test results will improve Outcome: Progressing Goal: Respiratory complications will improve Outcome: Progressing

## 2023-09-30 ENCOUNTER — Encounter: Payer: Self-pay | Admitting: Family Medicine

## 2023-09-30 DIAGNOSIS — I4891 Unspecified atrial fibrillation: Secondary | ICD-10-CM | POA: Diagnosis not present

## 2023-09-30 DIAGNOSIS — I340 Nonrheumatic mitral (valve) insufficiency: Secondary | ICD-10-CM | POA: Diagnosis not present

## 2023-09-30 LAB — CBC
HCT: 34.1 % — ABNORMAL LOW (ref 36.0–46.0)
Hemoglobin: 11.2 g/dL — ABNORMAL LOW (ref 12.0–15.0)
MCH: 28 pg (ref 26.0–34.0)
MCHC: 32.8 g/dL (ref 30.0–36.0)
MCV: 85.3 fL (ref 80.0–100.0)
Platelets: 145 10*3/uL — ABNORMAL LOW (ref 150–400)
RBC: 4 MIL/uL (ref 3.87–5.11)
RDW: 14.6 % (ref 11.5–15.5)
WBC: 12.3 10*3/uL — ABNORMAL HIGH (ref 4.0–10.5)
nRBC: 0 % (ref 0.0–0.2)

## 2023-09-30 LAB — COMPREHENSIVE METABOLIC PANEL
ALT: 81 U/L — ABNORMAL HIGH (ref 0–44)
AST: 29 U/L (ref 15–41)
Albumin: 3 g/dL — ABNORMAL LOW (ref 3.5–5.0)
Alkaline Phosphatase: 116 U/L (ref 38–126)
Anion gap: 9 (ref 5–15)
BUN: 18 mg/dL (ref 6–20)
CO2: 29 mmol/L (ref 22–32)
Calcium: 8.3 mg/dL — ABNORMAL LOW (ref 8.9–10.3)
Chloride: 98 mmol/L (ref 98–111)
Creatinine, Ser: 0.82 mg/dL (ref 0.44–1.00)
GFR, Estimated: >60 mL/min (ref >60)
Glucose, Bld: 157 mg/dL — ABNORMAL HIGH (ref 70–99)
Potassium: 3.7 mmol/L (ref 3.5–5.1)
Sodium: 136 mmol/L (ref 135–145)
Total Bilirubin: 0.6 mg/dL (ref 0.0–1.2)
Total Protein: 6.9 g/dL (ref 6.5–8.1)

## 2023-09-30 LAB — HEPARIN LEVEL (UNFRACTIONATED)
Heparin Unfractionated: 0.31 [IU]/mL (ref 0.30–0.70)
Heparin Unfractionated: 0.32 [IU]/mL (ref 0.30–0.70)

## 2023-09-30 MED ORDER — BUTALBITAL-APAP-CAFFEINE 50-325-40 MG PO TABS
1.0000 | ORAL_TABLET | Freq: Once | ORAL | Status: AC
  Administered 2023-09-30: 1 via ORAL
  Filled 2023-09-30: qty 1

## 2023-09-30 MED ORDER — AMIODARONE HCL 200 MG PO TABS
400.0000 mg | ORAL_TABLET | Freq: Two times a day (BID) | ORAL | Status: DC
  Administered 2023-09-30 – 2023-10-03 (×7): 400 mg via ORAL
  Filled 2023-09-30 (×7): qty 2

## 2023-09-30 NOTE — Plan of Care (Addendum)
  Problem: Cardiac: Goal: Ability to achieve and maintain adequate cardiopulmonary perfusion will improve Outcome: Progressing Problem: Clinical Measurements: Goal: Ability to maintain clinical measurements within normal limits will improve Outcome: Progressing Goal: Will remain free from infection Outcome: Progressing Goal: Cardiovascular complication will be avoided Outcome: Progressing

## 2023-09-30 NOTE — Progress Notes (Signed)
 Pt to be transferred to progressive unit 234A. Report given to [facility] RN. All questions answered. All belongings sent with pt. VSS at this time. Pt escorted by this RN.

## 2023-09-30 NOTE — Progress Notes (Signed)
 PHARMACY - ANTICOAGULATION CONSULT NOTE  Pharmacy Consult for Heparin Infusion Indication: atrial fibrillation  Allergies  Allergen Reactions   Penicillins Hives    Has patient had a PCN reaction causing immediate rash, facial/tongue/throat swelling, SOB or lightheadedness with hypotension: No Has patient had a PCN reaction causing severe rash involving mucus membranes or skin necrosis: No Has patient had a PCN reaction that required hospitalization: No Has patient had a PCN reaction occurring within the last 10 years: No If all of the above answers are "NO", then may proceed with Cephalosporin use.    Patient Measurements: Height: 5\' 7"  (170.2 cm) Weight: 95.7 kg (210 lb 15.7 oz) IBW/kg (Calculated) : 61.6 Heparin Dosing Weight: 82.6 kg  Vital Signs: Temp: 98.1 F (36.7 C) (03/10 0300) Temp Source: Oral (03/10 0300) BP: 116/93 (03/10 0400) Pulse Rate: 65 (03/10 0808)  Labs: Recent Labs    09/27/23 0836 09/27/23 1326 09/27/23 1440 09/28/23 0416 09/28/23 0833 09/28/23 1324 09/29/23 0504 09/29/23 0505 09/29/23 1115 09/29/23 1757 09/30/23 0007  HGB   < >  --   --  11.2*  --   --   --  10.9*  --   --  11.2*  HCT  --   --   --  34.3*  --   --   --  33.1*  --   --  34.1*  PLT  --   --   --  138*  --   --   --  124*  --   --  145*  HEPARINUNFRC  --   --   --  0.24*  --    < >  --  0.32 0.24* 0.36 0.31  CREATININE  --   --   --   --  1.09*  --  0.87  --   --   --  0.82  TROPONINIHS  --  16 16  --   --   --   --   --   --   --   --    < > = values in this interval not displayed.   Estimated Creatinine Clearance: 97.4 mL/min (by C-G formula based on SCr of 0.82 mg/dL).  Medical History: Past Medical History:  Diagnosis Date   Hypertension    Medications:  Scheduled:   amLODipine  10 mg Oral Daily   azithromycin  250 mg Oral Daily   bisoprolol  5 mg Oral BID   buPROPion  150 mg Oral Daily   busPIRone  5 mg Oral TID   Chlorhexidine Gluconate Cloth  6 each Topical  Daily   DULoxetine  40 mg Oral Daily   furosemide  40 mg Intravenous BID   ipratropium-albuterol  3 mL Nebulization BID   potassium chloride  40 mEq Oral BID   rosuvastatin  40 mg Oral Daily   Assessment: Sandra Hines is a 53 y.o. female presenting with palpitations. PMH significant for AVM s/p repair, TUD, HTN. Patient was not on [facility] PTA per chart review. Patient with newly diagnosed atrial fibrillation and a CHA2DS2-VASc score of at least 3 (age, hypertension, and heart failure). Cardiology planning to transition to [facility] once it is clear that no invasive procedures will need to be done this admission. Pharmacy has been consulted to initiate and manage heparin infusion.   Baseline Labs: PT 15.0, INR 1.2, Hgb 12.3, Hct 37.9, Plt 215   Goal of Therapy:  Heparin level 0.3-0.7 units/ml Monitor platelets by anticoagulation protocol: Yes   Date  Time HL Rate/Comment  3/6 1745 < 0.10 1300/subtherapeutic 3/7 0259 0.60 1550/therapeutic x1 3/7 0836 0.54 1550/therapeutic x2  3/8 0416 0.24 1550/subtherapeutic  3/8 1324 0.33 1750/therapeutic x1  3/8 1912 0.24 1750/subtherapeutic 3/9 0505 0.32 1900/therapeutic x 1 3/9 1115 0.24 1900/subtherapeutic 3/9 1757 0.36 2050/therapeutic x1 3/10 0007 0.31 2050/therapeutic x 2  Plan: heparin level therapeutic x 3 Continue heparin infusion at 2050 units/hr Recheck next heparin level in am 03/11 Continue to monitor H&H and platelets daily while on heparin infusion   Burnis Medin, PharmD, BCPS 09/30/2023 8:34 AM

## 2023-09-30 NOTE — Progress Notes (Signed)
 PHARMACY - ANTICOAGULATION CONSULT NOTE  Pharmacy Consult for Heparin Infusion Indication: atrial fibrillation  Allergies  Allergen Reactions   Penicillins Hives    Has patient had a PCN reaction causing immediate rash, facial/tongue/throat swelling, SOB or lightheadedness with hypotension: No Has patient had a PCN reaction causing severe rash involving mucus membranes or skin necrosis: No Has patient had a PCN reaction that required hospitalization: No Has patient had a PCN reaction occurring within the last 10 years: No If all of the above answers are "NO", then may proceed with Cephalosporin use.    Patient Measurements: Height: 5\' 7"  (170.2 cm) Weight: 95.7 kg (210 lb 15.7 oz) IBW/kg (Calculated) : 61.6 Heparin Dosing Weight: 82.6 kg  Vital Signs: Temp: 97.6 F (36.4 C) (03/09 2100) Temp Source: Oral (03/09 2100) BP: 117/90 (03/10 0000) Pulse Rate: 64 (03/10 0000)  Labs: Recent Labs    09/27/23 1326 09/27/23 1440 09/28/23 0416 09/28/23 0833 09/28/23 1324 09/29/23 0504 09/29/23 0505 09/29/23 1115 09/29/23 1757 09/30/23 0007  HGB  --   --  11.2*  --   --   --  10.9*  --   --  11.2*  HCT  --   --  34.3*  --   --   --  33.1*  --   --  34.1*  PLT  --   --  138*  --   --   --  124*  --   --  145*  HEPARINUNFRC  --   --  0.24*  --    < >  --  0.32 0.24* 0.36 0.31  CREATININE  --   --   --  1.09*  --  0.87  --   --   --  0.82  TROPONINIHS 16 16  --   --   --   --   --   --   --   --    < > = values in this interval not displayed.   Estimated Creatinine Clearance: 97.4 mL/min (by C-G formula based on SCr of 0.82 mg/dL).  Medical History: Past Medical History:  Diagnosis Date   Hypertension    Medications:  Scheduled:   amLODipine  10 mg Oral Daily   azithromycin  250 mg Oral Daily   bisoprolol  5 mg Oral BID   buPROPion  150 mg Oral Daily   busPIRone  5 mg Oral TID   Chlorhexidine Gluconate Cloth  6 each Topical Daily   DULoxetine  40 mg Oral Daily    furosemide  40 mg Intravenous BID   ipratropium-albuterol  3 mL Nebulization BID   potassium chloride  40 mEq Oral BID   rosuvastatin  40 mg Oral Daily   Assessment: Sandra Hines is a 53 y.o. female presenting with palpitations. PMH significant for AVM s/p repair, TUD, HTN. Patient was not on [facility] PTA per chart review. Patient with newly diagnosed atrial fibrillation and a CHA2DS2-VASc score of at least 3 (age, hypertension, and heart failure). Cardiology planning to transition to [facility] once it is clear that no invasive procedures will need to be done this admission. Pharmacy has been consulted to initiate and manage heparin infusion.   Baseline Labs: PT 15.0, INR 1.2, Hgb 12.3, Hct 37.9, Plt 215   Goal of Therapy:  Heparin level 0.3-0.7 units/ml Monitor platelets by anticoagulation protocol: Yes   Date Time HL Rate/Comment  3/6 1745 < 0.10 1300/subtherapeutic 3/7 0259 0.60 1550/therapeutic x1 3/7 0836 0.54 1550/therapeutic x2  3/8  0416 0.24 1550/subtherapeutic  3/8 1324 0.33 1750/therapeutic x1  3/8 1912 0.24 1750/subtherapeutic 3/9 0505 0.32 1900/therapeutic x 1 3/9 1115 0.24 1900/subtherapeutic 3/9 1757 0.36 2050/therapeutic x1 3/10 0007 0.31 2050/therapeutic x 2  Plan: Continue heparin infusion at 2050 units/hr Recheck HL at 1200 to reconfirm, then daily Continue to monitor H&H and platelets daily while on heparin infusion   Otelia Sergeant, PharmD, [facility] 09/30/2023 1:05 AM

## 2023-09-30 NOTE — Progress Notes (Signed)
 PROGRESS NOTE    Sandra Hines   ZOX:[id number] DOB: 08-06-1970  DOA: 09/25/2023 Date of Service: 09/30/23 which is hospital day 5  PCP: Ronnald Ramp, MD    Hospital course / significant events:   HPI: Sandra Hines is a 53 y.o. female with medical history significant for essential hypertension and cerebral aneurysm s/p repair, who presented to the emergency room with acute onset of palpitations 03/05 with a feeling of generalized weakness and malaise over the last couple weeks.   03/05: in ED, Afib RVR w/ rate 170s, CXR mild cardiomegaly, vascular congestion, question sequelae from granulomatous disease. In ED, received Ativan, Lopressor IV push, started Cardizem gtt but no response so started on amiodarone bolus/gtt. Cardiology consulted  03/06: requiring BiPap overnight, on HFNC this morning, starting tx for possible pneumonia/bronchitis today w/ abx steroids nebs, cardiology is adjusting medications for Afib, remains high HR but improved into the afternoon. 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 Converted to sinus.  03/07: on [place] O2 overnight. HR controlled, off dilt this morning, continue amio gtt into tomorrow, cardiology following and anticipate LHC and TEE next week. Holding lasix for now d/t AKI. Back on BiPap this afternoon/evening.  03/08: remaining on BiPap overnight, Cr improved so re-started IV lasix. Cardio to add ntg paste, will try to avoid ntg gtt and art line if able. Diuresing well.  03/09: improved today, off BIPap, good UOP -2L, Cr continues trend down, NSR. Plan to po amiodarone tomorrow.  03/10: po amiodarone started. Repeat limited echo to determine urgency of cardiac cath for this admission vs defer to outpatient, as pt wishes to dc home asap/as able      Consultants:  Cardiology PCCU   Procedures/Surgeries: none      ASSESSMENT & PLAN:   Atrial fibrillation with rapid ventricular  response - converted to sinus rhythm   Cardiology following - [facility] IV amiodarone drip --> po today  CHA2DS2-VASc score is 3, plan for anticoagulation eventually likely w/ eliquis, on heparin for now    Severe mitral regurgitation  Question undiagnosed rheumatic valvular heart disease or endocarditis (no obvious vegetation) maintain sinus rhythm and blood pressure control Anticipate possible TEE / heart cath per cardiology - Repeat limited echo to determine urgency of cardiac cath for this admission vs defer to outpatient, as pt wishes to dc home asap/as able   Acute CHF (congestive heart failure) HFpEF in setting of severe MR as above  Diuresis restarted given improvement in Cr - doing well  Cardiology following  Beta blocker w/ bisoprolol statin  Anticipate possible TEE / heart cath per cardiology - Repeat limited echo to determine urgency of cardiac cath for this admission vs defer to outpatient, as pt wishes to dc home asap/as able    Acute bronchitis Question CAP No formal COPD dx but possible this is contributing given tobacco hx  Lung sounds much improved today Treating for CAP w/ ceftriaxone + azithromycin Duonebs scheduled + prn  Steroids given hypoxia   Essential hypertension Continue amlodipine 10 mg daily Continue bisoprolol 5 mg bid  Cardiology following - defer changes in meds to their recs   Dyslipidemia statin    Anxiety and depression Continue home Wellbutrin SR, BuSpar, Cymbalta. Benzos prn, will d/c these if there appears to be respiratory depression but pt has tolerated thus far   Class 1 obesity based on BMI: Body mass index is 33.04 kg/m.  Underweight - under 18  overweight - 25 to 29 obese - 30 or more Class 1 obesity: BMI of 30.0 to 34 Class 2 obesity: BMI of 35.0 to 39 Class 3 obesity: BMI of 40.0 to 49 Super Morbid Obesity: BMI 50-59 Super-super Morbid Obesity: BMI 60+ Significantly low or high BMI is associated with higher medical risk.   Weight management advised as adjunct to other disease management and risk reduction treatments    DVT prophylaxis: heparin IV fluids: no continuous IV fluids  Nutrition: cardiac diet  Central lines / invasive devices: none  Code Status: FULL CODE ACP documentation reviewed:  none on file in [facility]  TOC needs: TBD pend PT/OT eval, expect will need HH and opossibly O2 Barriers to dispo / significant pending items: Repeat limited echo to determine urgency of cardiac cath for this admission vs defer to outpatient, as pt wishes to dc home asap/as able. If toelrating po amlodipine and ok w/. Cardiology may dc tomorrow              Subjective / Brief ROS:  Patient reports breathing is a lot better today NO other concerns  Denies CP  Family Communication:none at this time     Objective Findings:  Vitals:   09/30/23 0900 09/30/23 1000 09/30/23 1100 09/30/23 1152  BP: 126/82 (!) 129/93 (!) 119/93   Pulse: 72 65 65   Resp: 17 (!) 22 (!) 29   Temp:    97.8 F (36.6 C)  TempSrc:      SpO2: 95% 95% 97%   Weight:      Height:        Intake/Output Summary (Last 24 hours) at 09/30/2023 1418 Last data filed at 09/30/2023 1153 Gross per 24 hour  Intake 827.47 ml  Output 2000 ml  Net -1172.53 ml   Filed Weights   09/25/23 1703 09/26/23 0137  Weight: 94.8 kg 95.7 kg    Examination:  Physical Exam Constitutional:      General: She is not in acute distress. Cardiovascular:     Rate and Rhythm: Normal rate and regular rhythm.  Pulmonary:     Effort: No respiratory distress.     Breath sounds: Rales present. No wheezing or rhonchi (mild, scattred coarse breath sounds).  Abdominal:     General: There is no distension.  Musculoskeletal:     Right lower leg: No edema.     Left lower leg: No edema.  Neurological:     General: No focal deficit present.     Mental Status: She is alert.  Psychiatric:        Mood and Affect: Affect normal. Mood is anxious.           Scheduled Medications:   amiodarone  400 mg Oral BID   amLODipine  10 mg Oral Daily   bisoprolol  5 mg Oral BID   buPROPion  150 mg Oral Daily   busPIRone  5 mg Oral TID   Chlorhexidine Gluconate Cloth  6 each Topical Daily   DULoxetine  40 mg Oral Daily   furosemide  40 mg Intravenous BID   ipratropium-albuterol  3 mL Nebulization BID   potassium chloride  40 mEq Oral BID   rosuvastatin  40 mg Oral Daily    Continuous Infusions:  cefTRIAXone (ROCEPHIN)  IV 200 mL/hr at 09/30/23 1153   heparin 2,050 Units/hr (09/30/23 1153)    PRN Medications:  acetaminophen, cyclobenzaprine, guaiFENesin-codeine, ipratropium-albuterol, LORazepam, magnesium hydroxide, morphine injection, nitroGLYCERIN, ondansetron (ZOFRAN) IV, traZODone  Antimicrobials from admission:  Anti-infectives (From admission, onward)    Start     Dose/Rate Route Frequency Ordered Stop   09/29/23 1200  azithromycin (ZITHROMAX) tablet 250 mg        250 mg Oral Daily 09/28/23 1427 09/30/23 1142   09/27/23 1200  azithromycin (ZITHROMAX) 250 mg in dextrose 5 % 125 mL IVPB  Status:  Discontinued        250 mg 127.5 mL/hr over 60 Minutes Intravenous Every 24 hours 09/26/23 1123 09/28/23 1427   09/26/23 1230  cefTRIAXone (ROCEPHIN) 2 g in sodium chloride 0.9 % 100 mL IVPB        2 g 200 mL/hr over 30 Minutes Intravenous Every 24 hours 09/26/23 1123     09/26/23 1230  azithromycin (ZITHROMAX) 500 mg in sodium chloride 0.9 % 250 mL IVPB        500 mg 250 mL/hr over 60 Minutes Intravenous  Once 09/26/23 1123 09/26/23 1753           Data Reviewed:  I have personally reviewed the following...  CBC: Recent Labs  Lab 09/25/23 1711 09/26/23 0426 09/27/23 0259 09/28/23 0416 09/29/23 0505 09/30/23 0007  WBC 13.9* 12.8* 16.3* 15.5* 12.0* 12.3*  NEUTROABS 7.2  --   --   --   --   --   HGB 12.1 12.3 12.3 11.2* 10.9* 11.2*  HCT 37.7 37.9 37.8 34.3* 33.1* 34.1*  MCV 88.7 87.1 85.1 86.4 85.1 85.3  PLT  234 215 171 138* 124* 145*   Basic Metabolic Panel: Recent Labs  Lab 09/25/23 1711 09/26/23 0426 09/27/23 0259 09/27/23 1325 09/28/23 0833 09/29/23 0504 09/30/23 0007  NA 136   < > 133* 132* 134* 135 136  K 3.7   < > 4.2 4.3 3.7 3.3* 3.7  CL 104   < > 98 100 99 98 98  CO2 22   < > 21* 17* 26 29 29   GLUCOSE 132*   < > 251* 173* 113* 108* 157*  BUN 23*   < > 30* 35* 29* 21* 18  CREATININE 1.19*   < > 1.45* 1.52* 1.09* 0.87 0.82  CALCIUM 8.7*   < > 8.6* 8.3* 8.2* 7.8* 8.3*  MG 2.2  --   --   --   --   --   --    < > = values in this interval not displayed.   GFR: Estimated Creatinine Clearance: 97.4 mL/min (by C-G formula based on SCr of 0.82 mg/dL). Liver Function Tests: Recent Labs  Lab 09/30/23 0007  AST 29  ALT 81*  ALKPHOS 116  BILITOT 0.6  PROT 6.9  ALBUMIN 3.0*   No results for input(s): "LIPASE", "AMYLASE" in the last 168 hours. No results for input(s): "AMMONIA" in the last 168 hours. Coagulation Profile: Recent Labs  Lab 09/26/23 0426  INR 1.2   Cardiac Enzymes: No results for input(s): "CKTOTAL", "CKMB", "CKMBINDEX", "TROPONINI" in the last 168 hours. BNP (last 3 results) No results for input(s): "PROBNP" in the last 8760 hours. HbA1C: No results for input(s): "HGBA1C" in the last 72 hours. CBG: Recent Labs  Lab 09/26/23 0131  GLUCAP 140*   Lipid Profile: No results for input(s): "CHOL", "HDL", "LDLCALC", "TRIG", "CHOLHDL", "LDLDIRECT" in the last 72 hours.  Thyroid Function Tests: No results for input(s): "TSH", "T4TOTAL", "FREET4", "T3FREE", "THYROIDAB" in the last 72 hours.  Anemia Panel: No results for input(s): "VITAMINB12", "FOLATE", "FERRITIN", "TIBC", "IRON", "RETICCTPCT" in the last 72 hours. Most Recent Urinalysis On File:  Component Value Date/Time   COLORURINE YELLOW (A) 11/26/2020 0041   APPEARANCEUR CLEAR (A) 11/26/2020 0041   APPEARANCEUR Cloudy 08/15/2014 0519   LABSPEC 1.026 11/26/2020 0041   LABSPEC 1.017 08/15/2014  0519   PHURINE 5.0 11/26/2020 0041   GLUCOSEU NEGATIVE 11/26/2020 0041   GLUCOSEU Negative 08/15/2014 0519   HGBUR NEGATIVE 11/26/2020 0041   BILIRUBINUR NEGATIVE 11/26/2020 0041   BILIRUBINUR Negative 08/15/2014 0519   KETONESUR NEGATIVE 11/26/2020 0041   PROTEINUR NEGATIVE 11/26/2020 0041   NITRITE NEGATIVE 11/26/2020 0041   LEUKOCYTESUR NEGATIVE 11/26/2020 0041   LEUKOCYTESUR Negative 08/15/2014 0519   Sepsis Labs: @LABRCNTIP (procalcitonin:4,lacticidven:4) Microbiology: Recent Results (from the past 240 hours)  Resp panel by RT-PCR (RSV, Flu A&B, Covid) Anterior Nasal Swab     Status: None   Collection Time: 09/25/23  5:22 PM   Specimen: Anterior Nasal Swab  Result Value Ref Range Status   SARS Coronavirus 2 by RT PCR NEGATIVE NEGATIVE Final    Comment: (NOTE) SARS-CoV-2 target nucleic acids are NOT DETECTED.  The SARS-CoV-2 RNA is generally detectable in upper respiratory specimens during the acute phase of infection. The lowest concentration of SARS-CoV-2 viral copies this assay can detect is 138 copies/mL. A negative result does not preclude SARS-Cov-2 infection and should not be used as the sole basis for treatment or other patient management decisions. A negative result may occur with  improper specimen collection/handling, submission of specimen other than nasopharyngeal swab, presence of viral mutation(s) within the areas targeted by this assay, and inadequate number of viral copies(<138 copies/mL). A negative result must be combined with clinical observations, patient history, and epidemiological information. The expected result is Negative.  Fact Sheet for Patients:  BloggerCourse.com  Fact Sheet for Healthcare Providers:  SeriousBroker.it  This test is no t yet approved or cleared by the [place] FDA and  has been authorized for detection and/or diagnosis of SARS-CoV-2 by FDA under an Emergency Use  Authorization (EUA). This EUA will remain  in effect (meaning this test can be used) for the duration of the COVID-19 declaration under Section 564(b)(1) of the Act, 21 U.S.C.section 360bbb-3(b)(1), unless the authorization is terminated  or revoked sooner.       Influenza A by PCR NEGATIVE NEGATIVE Final   Influenza B by PCR NEGATIVE NEGATIVE Final    Comment: (NOTE) The Xpert Xpress SARS-CoV-2/FLU/RSV plus assay is intended as an aid in the diagnosis of influenza from Nasopharyngeal swab specimens and should not be used as a sole basis for treatment. Nasal washings and aspirates are unacceptable for Xpert Xpress SARS-CoV-2/FLU/RSV testing.  Fact Sheet for Patients: BloggerCourse.com  Fact Sheet for Healthcare Providers: SeriousBroker.it  This test is not yet approved or cleared by the [place] FDA and has been authorized for detection and/or diagnosis of SARS-CoV-2 by FDA under an Emergency Use Authorization (EUA). This EUA will remain in effect (meaning this test can be used) for the duration of the COVID-19 declaration under Section 564(b)(1) of the Act, 21 U.S.C. section 360bbb-3(b)(1), unless the authorization is terminated or revoked.     Resp Syncytial Virus by PCR NEGATIVE NEGATIVE Final    Comment: (NOTE) Fact Sheet for Patients: BloggerCourse.com  Fact Sheet for Healthcare Providers: SeriousBroker.it  This test is not yet approved or cleared by the [place] FDA and has been authorized for detection and/or diagnosis of SARS-CoV-2 by FDA under an Emergency Use Authorization (EUA). This EUA will remain in effect (meaning this test can be used) for the duration of  the COVID-19 declaration under Section 564(b)(1) of the Act, 21 U.S.C. section 360bbb-3(b)(1), unless the authorization is terminated or revoked.  Performed at [facility], [address] [place], [place] [address]   MRSA Next Gen by PCR, Nasal     Status: None   Collection Time: 09/26/23  1:39 AM   Specimen: Nasal Mucosa; Nasal Swab  Result Value Ref Range Status   MRSA by PCR Next Gen NOT DETECTED NOT DETECTED Final    Comment: (NOTE) The GeneXpert MRSA Assay (FDA approved for NASAL specimens only), is one component of a comprehensive MRSA colonization surveillance program. It is not intended to diagnose MRSA infection nor to guide or monitor treatment for MRSA infections. Test performance is not FDA approved in patients less than 60 years old. Performed at [facility], [address] [place], [place] [address]   Culture, blood (Routine X 2) w Reflex to ID Panel     Status: None (Preliminary result)   Collection Time: 09/26/23  3:31 AM   Specimen: BLOOD  Result Value Ref Range Status   Specimen Description BLOOD BLOOD LEFT HAND  Final   Special Requests   Final    BOTTLES DRAWN AEROBIC AND ANAEROBIC Blood Culture adequate volume   Culture   Final    NO GROWTH 4 DAYS Performed at [facility], [address] [place], [place] [address]    Report Status PENDING  Incomplete  Culture, blood (Routine X 2) w Reflex to ID Panel     Status: None (Preliminary result)   Collection Time: 09/26/23  3:40 AM   Specimen: BLOOD  Result Value Ref Range Status   Specimen Description BLOOD BLOOD RIGHT HAND  Final   Special Requests   Final    BOTTLES DRAWN AEROBIC AND ANAEROBIC Blood Culture adequate volume   Culture   Final    NO GROWTH 4 DAYS Performed at [facility], [address], [place], [place] [address]    Report Status PENDING  Incomplete      Radiology Studies last 3 days: DG Chest Port 1 View Result Date: 09/28/2023 CLINICAL DATA:  [id number] Respiratory distress [id number] EXAM: PORTABLE CHEST 1 VIEW COMPARISON:  September 25, 2023 FINDINGS: The cardiomediastinal silhouette is unchanged and enlarged in contour. Favor small RIGHT pleural  effusion. No pneumothorax. Increased diffuse bilateral airspace opacities with vascular indistinctness and peribronchial cuffing. IMPRESSION: 1. Constellation of findings are favored to reflect pulmonary edema, increased. Differential considerations include atypical infection. 2. RIGHT pleural effusion. Electronically Signed   By: Meda Klinefelter M.D.   On: 09/28/2023 08:32       Time spent: 50 min     Sunnie Nielsen, DO [facility] 09/30/2023, 2:18 PM    Dictation software may have been used to generate the above note. Typos may occur and escape review in typed/dictated notes. Please contact Dr Lyn Hollingshead directly for clarity if needed.  Staff may message me via secure chat in Epic  but this may not receive an immediate response,  please page me for urgent matters!  If 7PM-7AM, please contact night coverage [email or web address]

## 2023-09-30 NOTE — Progress Notes (Signed)
 Pt has arrived to progressive care unit via stretcher from ICU. Pt is in no acute distress. VSS. Call bell within reach.

## 2023-09-30 NOTE — Progress Notes (Signed)
   Patient Name: EDELL MESENBRINK Date of Encounter: 09/30/2023 [facility] Cardiologist: New  Interval Summary  .    Patient remains in NSR. She denies chest pain or SOB.   Vital Signs .    Vitals:   09/30/23 0500 09/30/23 0800 09/30/23 0808 09/30/23 0900  BP:  (!) 126/93  126/82  Pulse: 65 66 65 72  Resp: 20 (!) 21 20 17   Temp:  98.8 F (37.1 C)    TempSrc:  Oral    SpO2: 93% 97% 99% 95%  Weight:      Height:        Intake/Output Summary (Last 24 hours) at 09/30/2023 1003 Last data filed at 09/30/2023 0417 Gross per 24 hour  Intake 632.62 ml  Output 3300 ml  Net -2667.38 ml      09/26/2023    1:37 AM 09/25/2023    5:03 PM 09/25/2023    4:04 PM  Last 3 Weights  Weight (lbs) 210 lb 15.7 oz 209 lb 210 lb 4.8 oz  Weight (kg) 95.7 kg 94.802 kg 95.391 kg      Telemetry/ECG    Nsr Hr 60s - Personally Reviewed  Physical Exam .   GEN: No acute distress.   Neck: No JVD Cardiac: RRR, no murmurs, rubs, or gallops.  Respiratory: mild wheezing. GI: Soft, nontender, non-distended  MS: No edema  Assessment & Plan .     Afib RVR - presented with Afib RVR, difficult to control on IV dilt infusion. Started on amiodarone infusion, bisoprolol and IV dig with conversion to NSR 3/6 - echo LVEF 45-50%, dilated left atrium and severe MR - CHADSVASC of 3 - IV heparin - can transition to oral amiodarone today - continue bisoprolol 5mg  BID  Acute respiratory distress Viral USI/bronchitis - presented with chest congestion x 2 weeks - Chest CT concerning for possible PNA - steroids, nebs, abx per IM - has required Bipap intermittently  Acute systolic CHF - echo showed LVEF 45-50%, global HK, mild LVH, mildly reduced RVSF, rheumatic MV, severe MR, mild to mod TR, severely dilated left atrium - IV lasix 40mg  BID restarted 3/8 - Net -4.3L - appears euvolemic. Can likely switch to oral lasix tomorrow - question of R/L heart cath during admission to evaluate heart valve and  low EF, however patient is wanting to go home. Low EF may be from rapid Afib. I ordered repeat limited echo.  Md to see. Still on IV heparin in case of procedure. Kidney function normal.  Severe MR - rheumatic appearing valve, severely dilated left atrium - plan for repeat limited echo once euvolemic, may  show improved MR since she is in NSR   Anxiety - PRN ativan   For questions or updates, please contact [facility] Please consult [email or web address] for contact info under        Signed, Arval Brandstetter David Stall, PA-C

## 2023-09-30 NOTE — Progress Notes (Signed)
 Heart Failure Nurse Navigator Progress Note  PCP: Simmons-Robinson, Tawanna Cooler, MD PCP-Cardiologist: New [facility] Patient Admission Diagnosis: Atrial Fibrillation with RVR [facility]) Admitted from: Doctor Office via [facility]  Presentation:   Sandra Hines presented to the emergency department today from her primary care office because of concerns for new onset A-fib with RVR.  The patient states that she has not been feeling well for the past few days.  She had been seen for tonsillitis but on top of that she had noticed  some shortness of breath. Had difficulty trying to go to bed or lying flat. Cough was present. Patient found in the clinic to be in atrial fibrillation with RVR  & BP 211/154 and a HR of 182. BNP 700. CXR- mild pulmonary vascular congestion and consistent with prior granulomatous disease.   ECHO/ LVEF: 45-50% -Planning to repeat Echo again  Clinical Course:  Past Medical History:  Diagnosis Date   Hypertension      Social History   Socioeconomic History   Marital status: Single    Spouse name: Not on file   Number of children: 8   Years of education: Not on file   Highest education level: Some college, no degree  Occupational History   Not on file  Tobacco Use   Smoking status: Every Day    Current packs/day: 1.00    Types: Cigarettes   Smokeless tobacco: Never  Vaping Use   Vaping status: Never Used  Substance and Sexual Activity   Alcohol use: No   Drug use: No   Sexual activity: Yes    Birth control/protection: None, Condom  Other Topics Concern   Not on file  Social History Narrative   Not on file   Social Drivers of Health   Financial Resource Strain: Medium Risk (09/30/2023)   Overall Financial Resource Strain (CARDIA)    Difficulty of Paying Living Expenses: Somewhat hard  Food Insecurity: No Food Insecurity (09/26/2023)   Hunger Vital Sign    Worried About Running Out of Food in the Last Year: Never true    Ran Out of Food in the Last Year: Never true   Transportation Needs: No Transportation Needs (09/30/2023)   PRAPARE - Administrator, Civil Service (Medical): No    Lack of Transportation (Non-Medical): No  Physical Activity: Not on file  Stress: Not on file  Social Connections: Not on file  Education Assessment and Provision:  Detailed education and instructions provided on heart failure disease management including the following:  Signs and symptoms of Heart Failure When to call the physician Importance of daily weights Low sodium diet Fluid restriction Medication management Anticipated future follow-up appointments  Patient education given on each of the above topics.  Patient acknowledges understanding via teach back method and acceptance of all instructions.  Education Materials:  "Living Better With Heart Failure" Booklet, HF zone tool, & Daily Weight Tracker Tool.  Patient has scale at home: No-will provide her with one. Patient has pill box at home: No-will provide her with one.  High Risk Criteria for Readmission and/or Poor Patient Outcomes: Heart failure hospital admissions (last 6 months): 0  No Show rate: 13 Difficult social situation: Smoking Demonstrates medication adherence: Yes Primary Language: English Literacy level: Reading, Writing & Comprehension   Considerations/Referrals:   Referral made to Heart Failure Pharmacist Stewardship: Yes Referral made to Heart Failure CSW/NCM TOC: No Referral made to Heart & Vascular TOC clinic: Yes. 10/07/23 @ 9:30 AM  Items for Follow-up on  DC/TOC: Diet & Fluid Restrictions Daily Weights Smoking Cessation Continued Heart Failure Education  Roxy Horseman, RN, BSN [facility] Heart Failure Navigator Secure Chat Only

## 2023-09-30 NOTE — Plan of Care (Signed)

## 2023-09-30 NOTE — TOC Initial Note (Signed)
 Transition of Care [facility]) - Initial/Assessment Note    Patient Details  Name: Sandra Hines MRN: [id number] Date of Birth: 1970-08-19  Transition of Care [facility]) CM/SW Contact:    Garret Reddish, RN Phone Number: 09/30/2023, 12:22 PM  Clinical Narrative:                 Chart reviewed.  Noted that patient was admitted with A-fib with Rapid Ventricular Response.  Cardiology following patient.  Patient required Amiodarone drip and IV Heparin.   Patient may require a Heart Cath and TEE.  I have meet with patient at bedside.  She informs me that prior to admission she lived at home with her children.  She was independent of ADL's prior to admission.  She informed me that she was driving.  She reports that she was also working.  She reports that she had been able to afford prescription medications prior to admission.  She [facility]    TOC will continue to follow for discharge planning.    Expected Discharge Plan: Home/Self Care Barriers to Discharge: No Barriers Identified   Patient Goals and CMS Choice            Expected Discharge Plan and Services   Discharge Planning Services: CM Consult   Living arrangements for the past 2 months: Single Family Home                                      Prior Living Arrangements/Services Living arrangements for the past 2 months: Single Family Home Lives with::  (Lives with children at home.) Patient language and need for interpreter reviewed:: Yes Do you feel safe going back to the place where you live?: Yes        Care giver support system in place?: Yes (comment)      Activities of Daily Living   ADL Screening (condition at time of admission) Independently performs ADLs?: Yes (appropriate for developmental age) Is the patient deaf or have difficulty hearing?: No Does the patient have difficulty seeing, even when wearing glasses/contacts?: No Does the patient have difficulty  concentrating, remembering, or making decisions?: No  Permission Sought/Granted                  Emotional Assessment Appearance:: Appears stated age   Affect (typically observed): Appropriate Orientation: : Oriented to Self, Oriented to Place, Oriented to  Time, Oriented to Situation      Admission diagnosis:  Atrial fibrillation with rapid ventricular response (HCC) [I48.91] Atrial fibrillation with RVR (HCC) [I48.91] Patient Active Problem List   Diagnosis Date Noted   Acute diastolic CHF (congestive heart failure) (HCC) 09/29/2023   Severe mitral valve regurgitation 09/28/2023   Atrial fibrillation with rapid ventricular response (HCC) 09/25/2023   Acute CHF (congestive heart failure) (HCC) 09/25/2023   Essential hypertension 09/25/2023   Anxiety and depression 09/25/2023   Dyslipidemia 09/25/2023   Acute bronchitis due to other specified organisms 09/05/2023   Mood disorder (HCC) 07/20/2023   Arthritis 07/20/2023   Annual physical exam 07/19/2023   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 07/19/2023   Positive screening for depression on 9-item Patient Health Questionnaire (PHQ-9) 07/19/2023   Establishing care with new doctor, encounter for 07/19/2023   Moderate tobacco dependence 07/19/2023   Encounter for screening for HIV 07/19/2023   Encounter for hepatitis  C screening test for low risk patient 07/19/2023   Primary hypertension 07/19/2023   Screen for colon cancer 07/19/2023   Screening mammogram for breast cancer 07/19/2023   Screening for lung cancer 07/19/2023   BMI 32.0-32.9,adult 07/19/2023   Perimenopausal symptom 07/19/2023   PCP:  Ronnald Ramp, MD Pharmacy:   [facility] [address] [place] [facility] [address] [place] [address] Phone: [phone] Fax: [phone]     Social Drivers of Health (SDOH) Social History: SDOH Screenings   Food  Insecurity: No Food Insecurity (09/26/2023)  Housing: Low Risk  (09/30/2023)  Transportation Needs: No Transportation Needs (09/30/2023)  Utilities: Not At Risk (09/26/2023)  Depression (PHQ2-9): High Risk (09/25/2023)  Financial Resource Strain: Medium Risk (09/30/2023)  Tobacco Use: High Risk (09/30/2023)   SDOH Interventions: Housing Interventions: Intervention Not Indicated Transportation Interventions: Intervention Not Indicated Financial Strain Interventions: Intervention Not Indicated   Readmission Risk Interventions     No data to display

## 2023-09-30 NOTE — Progress Notes (Signed)
 Heart Failure Stewardship Pharmacy Note  PCP: Ronnald Ramp, MD PCP-Cardiologist: None  HPI: Sandra Hines is a 53 y.o. female with essential hypertension and cerebral aneurysm s/p repair, tobacco use, dyslipidemia, who presented with generalized weakness/malaise over 2 weeks and acute onset palpitations. Found in clinic to be in atrial fibrillation with RVR with BP of 211/154 and rate of 182. On admission, BNP was 700, HS-troponin was 16, free T4 1.16, and TSH was 1.091. Chest x-ray noted mild pulmonary vascular congestion and consistent with prior granulomatous disease. CT PE noted no evidence of thrombus, possible PH, 4.3 cm aortic aneurysm, interstitial edema consistent with edema/CHF. Echocardiogram showed LVEF 45-50% with mild LVH, severely dilated LA, mildly reduced RV function, severe MR, mild-moderate TR. Cardiology planning to repeat echo when euvolemic to see if MR is less severe.  Pertinent Lab Values: Creatinine  Date Value Ref Range Status  08/15/2014 0.80 0.60 - 1.30 mg/dL Final   Creatinine, Ser  Date Value Ref Range Status  09/30/2023 0.82 0.44 - 1.00 mg/dL Final   BUN  Date Value Ref Range Status  09/30/2023 18 6 - 20 mg/dL Final  16/04/9603 16 6 - 24 mg/dL Final  54/03/8118 10 7 - 18 mg/dL Final   Potassium  Date Value Ref Range Status  09/30/2023 3.7 3.5 - 5.1 mmol/L Final  08/15/2014 3.8 3.5 - 5.1 mmol/L Final   Sodium  Date Value Ref Range Status  09/30/2023 136 135 - 145 mmol/L Final  07/19/2023 139 134 - 144 mmol/L Final  08/15/2014 136 136 - 145 mmol/L Final   B Natriuretic Peptide  Date Value Ref Range Status  09/25/2023 700.0 (H) 0.0 - 100.0 pg/mL Final    Comment:    Performed at [facility], [address] [place], [place] [address]   Magnesium  Date Value Ref Range Status  09/25/2023 2.2 1.7 - 2.4 mg/dL Final    Comment:    Performed at [facility], [address] [place], [place] [address]   Hgb A1c MFr  Bld  Date Value Ref Range Status  07/19/2023 5.8 (H) 4.8 - 5.6 % Final    Comment:             Prediabetes: 5.7 - 6.4          Diabetes: >6.4          Glycemic control for adults with diabetes: <7.0    TSH  Date Value Ref Range Status  09/25/2023 1.091 0.350 - 4.500 uIU/mL Final    Comment:    Performed by a 3rd Generation assay with a functional sensitivity of <=0.01 uIU/mL. Performed at [facility], [address] [place], [place] [address]   07/19/2023 0.406 (L) 0.450 - 4.500 uIU/mL Final    Vital Signs: Admission weight: 210 lbs Temp:  [97.6 F (36.4 C)-98.8 F (37.1 C)] 98.8 F (37.1 C) (03/10 0800) Pulse Rate:  [61-72] 65 (03/10 1000) Cardiac Rhythm: Normal sinus rhythm (03/10 0800) Resp:  [9-34] 22 (03/10 1000) BP: (105-132)/(67-102) 129/93 (03/10 1000) SpO2:  [92 %-100 %] 95 % (03/10 1000)  Intake/Output Summary (Last 24 hours) at 09/30/2023 1111 Last data filed at 09/30/2023 0800 Gross per 24 hour  Intake 597.05 ml  Output 3700 ml  Net -3102.95 ml    Current Heart Failure Medications:  Loop diuretic: none Beta-Blocker: bisoprolol 5 mg daily ACEI/ARB/ARNI: none MRA: none SGLT2i: none Other: amlodipine 10 mg daily  Prior to admission Heart Failure Medications:  Loop diuretic: none  Beta-Blocker: none ACEI/ARB/ARNI: none MRA: none SGLT2i: none Other: amlodipine 10 mg daily  Assessment: 1. Acute systolic heart failure (LVEF 40-45%) with mildly reduced RV function and severe MR, due to unknown etiology. NYHA class III symptoms.  -Symptoms: Reports shortness of breath is much improved. Endorses anxiety. Still feels fatigued. -Volume: No LEE. Creatinine stable. Currently on furosemide 40 mg IV BID. Good urine output yesterday. Symptoms are improved. Still lethargic. No daily weights recorded. Can consider transition to oral diuretics tomorrow. -Hemodynamics: BP is elevated. HR is now 60-70s in NSR.  -BB: Would avoid beta blocker titration at this  time given severe MR and current NSR. Continue bisoprolol 5 mg daily. -ACEI/ARB/ARNI: Can consider for afterload reduction in place of PTA amlodipine given new HFmrEF and mildly reduced RV function. -MRA: Given that ejection fraction is in the mildly reduced category, would likely derive greater benefit from spironolactone. -SGLT2i: Consider adding Jardiance if patient is able to afford her deductible.   -PNA treatment in progress.  Plan: 1) Medication changes recommended at this time: -Would consider adding spironolactone 12.5 mg daily.  -Can consider transition from amlodipine to ARB tomorrow if creatinine is stable.  2) Patient assistance: -Patient has a $7500 dollar deductible. This makes all branded medications very expensive. Entresto copay card would still work, however the other copay cards do not cover deductible. Jardiance requires prior authorization.  3) Education: - Patient has been educated on current HF medications and potential additions to HF medication regimen - Patient verbalizes understanding that over the next few months, these medication doses may change and more medications may be added to optimize HF regimen - Patient has been educated on basic disease state pathophysiology and goals of therapy  Medication Assistance / Insurance Benefits Check: Does the patient have prescription insurance?    Type of insurance plan:  Does the patient qualify for medication assistance through manufacturers or grants? Pending   Outpatient Pharmacy: [facility]      Please do not hesitate to reach out with questions or concerns,  Enos Fling, PharmD, CPP, BCPS Heart Failure Pharmacist  Phone - [phone] 09/30/2023 11:11 AM

## 2023-10-01 ENCOUNTER — Inpatient Hospital Stay (HOSPITAL_COMMUNITY)
Admit: 2023-10-01 | Discharge: 2023-10-01 | Disposition: A | Discharge status: Home / Self Care | Attending: Medical | Admitting: Medical

## 2023-10-01 DIAGNOSIS — I4891 Unspecified atrial fibrillation: Secondary | ICD-10-CM | POA: Diagnosis not present

## 2023-10-01 DIAGNOSIS — I340 Nonrheumatic mitral (valve) insufficiency: Secondary | ICD-10-CM

## 2023-10-01 LAB — CULTURE, BLOOD (ROUTINE X 2)
Culture: NO GROWTH
Culture: NO GROWTH
Special Requests: ADEQUATE
Special Requests: ADEQUATE

## 2023-10-01 LAB — BASIC METABOLIC PANEL
Anion gap: 10 (ref 5–15)
BUN: 20 mg/dL (ref 6–20)
CO2: 29 mmol/L (ref 22–32)
Calcium: 8.9 mg/dL (ref 8.9–10.3)
Chloride: 100 mmol/L (ref 98–111)
Creatinine, Ser: 0.96 mg/dL (ref 0.44–1.00)
GFR, Estimated: >60 mL/min (ref >60)
Glucose, Bld: 116 mg/dL — ABNORMAL HIGH (ref 70–99)
Potassium: 3.1 mmol/L — ABNORMAL LOW (ref 3.5–5.1)
Sodium: 139 mmol/L (ref 135–145)

## 2023-10-01 LAB — CBC
HCT: 34.8 % — ABNORMAL LOW (ref 36.0–46.0)
HCT: 36.2 % (ref 36.0–46.0)
Hemoglobin: 11.4 g/dL — ABNORMAL LOW (ref 12.0–15.0)
Hemoglobin: 11.6 g/dL — ABNORMAL LOW (ref 12.0–15.0)
MCH: 28 pg (ref 26.0–34.0)
MCH: 27.4 pg (ref 26.0–34.0)
MCHC: 32.8 g/dL (ref 30.0–36.0)
MCHC: 32 g/dL (ref 30.0–36.0)
MCV: 85.5 fL (ref 80.0–100.0)
MCV: 85.4 fL (ref 80.0–100.0)
Platelets: 166 10*3/uL (ref 150–400)
Platelets: 196 10*3/uL (ref 150–400)
RBC: 4.07 MIL/uL (ref 3.87–5.11)
RBC: 4.24 MIL/uL (ref 3.87–5.11)
RDW: 14.6 % (ref 11.5–15.5)
RDW: 14.5 % (ref 11.5–15.5)
WBC: 10.3 10*3/uL (ref 4.0–10.5)
WBC: 10.5 10*3/uL (ref 4.0–10.5)
nRBC: 0.2 % (ref 0.0–0.2)
nRBC: 0 % (ref 0.0–0.2)

## 2023-10-01 LAB — ECHOCARDIOGRAM LIMITED
Area-P 1/2: 1.76 cm2
Height: 67 in
MV M vel: 5.56 m/s
MV Peak grad: 123.7 mmHg
Radius: 1.1 cm
S' Lateral: 4.1 cm
Weight: 3375.68 [oz_av]

## 2023-10-01 LAB — HEPARIN LEVEL (UNFRACTIONATED): Heparin Unfractionated: 0.41 [IU]/mL (ref 0.30–0.70)

## 2023-10-01 MED ORDER — SODIUM CHLORIDE 0.9 % IV SOLN: INTRAVENOUS | Status: DC

## 2023-10-01 MED ORDER — BUTALBITAL-APAP-CAFFEINE 50-325-40 MG PO TABS
1.0000 | ORAL_TABLET | Freq: Once | ORAL | Status: AC
  Administered 2023-10-01: 1 via ORAL
  Filled 2023-10-01: qty 1

## 2023-10-01 MED ORDER — FUROSEMIDE 40 MG PO TABS
40.0000 mg | ORAL_TABLET | Freq: Every day | ORAL | Status: DC
  Administered 2023-10-01: 40 mg via ORAL
  Filled 2023-10-01: qty 1

## 2023-10-01 MED ORDER — ASPIRIN 81 MG PO CHEW
81.0000 mg | CHEWABLE_TABLET | ORAL | Status: AC
  Administered 2023-10-02: 81 mg via ORAL
  Filled 2023-10-01: qty 1

## 2023-10-01 NOTE — Progress Notes (Signed)
 PROGRESS NOTE    Sandra Hines   EAV:[id number] DOB: 1970/11/11  DOA: 09/25/2023 Date of Service: 10/01/23 which is hospital day 6  PCP: Ronnald Ramp, MD    Hospital course / significant events:   HPI: Sandra Hines is a 53 y.o. female with medical history significant for essential hypertension and cerebral aneurysm s/p repair, who presented to the emergency room with acute onset of palpitations 03/05 with a feeling of generalized weakness and malaise over the last couple weeks.   03/05: in ED, Afib RVR w/ rate 170s, CXR mild cardiomegaly, vascular congestion, question sequelae from granulomatous disease. In ED, received Ativan, Lopressor IV push, started Cardizem gtt but no response so started on amiodarone bolus/gtt. Cardiology consulted  03/06: requiring BiPap overnight, on HFNC this morning, starting tx for possible pneumonia/bronchitis today w/ abx steroids nebs, cardiology is adjusting medications for Afib, remains high HR but improved into the afternoon. 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 Converted to sinus.  03/07: on [place] O2 overnight. HR controlled, off dilt this morning, continue amio gtt into tomorrow, cardiology following and anticipate LHC and TEE next week. Holding lasix for now d/t AKI. Back on BiPap this afternoon/evening.  03/08: remaining on BiPap overnight, Cr improved so re-started IV lasix. Cardio to add ntg paste, will try to avoid ntg gtt and art line if able. Diuresing well.  03/09: improved today, off BIPap, good UOP -2L, Cr continues trend down, NSR. Plan to po amiodarone tomorrow.  03/10: po amiodarone started. Repeat limited echo. R/L Cath planned for tomorrow.      Consultants:  Cardiology [facility]   Procedures/Surgeries: none      ASSESSMENT & PLAN:   Atrial fibrillation with rapid ventricular response - converted to sinus rhythm   Cardiology following - [facility] IV amiodarone drip  --> po  CHA2DS2-VASc score is 3, plan for anticoagulation eventually likely w/ eliquis, on heparin for now    Severe mitral regurgitation  Question undiagnosed rheumatic valvular heart disease or endocarditis (no obvious vegetation) maintain sinus rhythm and blood pressure control Anticipate possible TEE / heart cath per cardiology --> LHC/RHC tomorrow   Acute CHF (congestive heart failure) HFpEF in setting of severe MR as above  Diuresis restarted given improvement in Cr - doing well  Cardiology following  Beta blocker w/ bisoprolol statin  Anticipate possible TEE / heart cath per cardiology -  --> LHC/RHC tomorrow   Acute bronchitis Question CAP No formal COPD dx but possible this is contributing given tobacco hx  Lung sounds much improved today ceftriaxone + azithromycin x5 days Duonebs scheduled + prn --> LAMA/LABA to start tomorrow  S/p steroids    Essential hypertension Continue amlodipine 10 mg daily Continue bisoprolol 5 mg bid  Cardiology following - defer changes in meds to their recs   Dyslipidemia statin    Anxiety and depression Continue home Wellbutrin SR, BuSpar, Cymbalta. Benzos prn, will d/c these if there appears to be respiratory depression but pt has tolerated thus far   Class 1 obesity based on BMI: Body mass index is 33.04 kg/m.  Underweight - under 18  overweight - 25 to 29 obese - 30 or more Class 1 obesity: BMI of 30.0 to 34 Class 2 obesity: BMI of 35.0 to 39 Class 3 obesity: BMI of 40.0 to 49 Super Morbid Obesity: BMI 50-59 Super-super Morbid Obesity: BMI 60+ Significantly low or high BMI is associated with higher medical risk.  Weight  management advised as adjunct to other disease management and risk reduction treatments    DVT prophylaxis: heparin IV fluids: no continuous IV fluids  Nutrition: cardiac diet  Central lines / invasive devices: none  Code Status: FULL CODE ACP documentation reviewed:  none on file in [facility]  TOC needs:  TBD pend PT/OT eval, expect will need HH and opossibly O2 Barriers to dispo / significant pending items: cardiac cath planned for tomorrow              Subjective / Brief ROS:  Patient reports no concerns today    Family Communication: mother at bedside on rounds     Objective Findings:  Vitals:   10/01/23 0018 10/01/23 0859 10/01/23 1131 10/01/23 1454  BP: (!) 123/92 117/80 110/85 116/81  Pulse: (!) 58 62 60 62  Resp: 18 20 18 18   Temp: 97.8 F (36.6 C) 98 F (36.7 C) 98.3 F (36.8 C) 98.1 F (36.7 C)  TempSrc:    Oral  SpO2: 94% 100% 97% 97%  Weight:      Height:        Intake/Output Summary (Last 24 hours) at 10/01/2023 1510 Last data filed at 10/01/2023 1041 Gross per 24 hour  Intake 470 ml  Output 1400 ml  Net -930 ml   Filed Weights   09/25/23 1703 09/26/23 0137  Weight: 94.8 kg 95.7 kg    Examination:  Physical Exam Constitutional:      General: She is not in acute distress. Cardiovascular:     Rate and Rhythm: Normal rate and regular rhythm.  Pulmonary:     Effort: No respiratory distress.     Breath sounds: Rales (faint) present. No wheezing or rhonchi (mild, scattred coarse breath sounds).  Abdominal:     General: There is no distension.  Musculoskeletal:     Right lower leg: No edema.     Left lower leg: No edema.  Neurological:     General: No focal deficit present.     Mental Status: She is alert.  Psychiatric:        Mood and Affect: Mood and affect normal.          Scheduled Medications:   amiodarone  400 mg Oral BID   amLODipine  10 mg Oral Daily   bisoprolol  5 mg Oral BID   buPROPion  150 mg Oral Daily   busPIRone  5 mg Oral TID   Chlorhexidine Gluconate Cloth  6 each Topical Daily   DULoxetine  40 mg Oral Daily   furosemide  40 mg Oral Daily   ipratropium-albuterol  3 mL Nebulization BID   potassium chloride  40 mEq Oral BID   rosuvastatin  40 mg Oral Daily    Continuous Infusions:  heparin 2,050 Units/hr  (10/01/23 0548)    PRN Medications:  acetaminophen, cyclobenzaprine, guaiFENesin-codeine, ipratropium-albuterol, LORazepam, magnesium hydroxide, morphine injection, nitroGLYCERIN, ondansetron (ZOFRAN) IV, traZODone  Antimicrobials from admission:  Anti-infectives (From admission, onward)    Start     Dose/Rate Route Frequency Ordered Stop   09/29/23 1200  azithromycin (ZITHROMAX) tablet 250 mg        250 mg Oral Daily 09/28/23 1427 09/30/23 1142   09/27/23 1200  azithromycin (ZITHROMAX) 250 mg in dextrose 5 % 125 mL IVPB  Status:  Discontinued        250 mg 127.5 mL/hr over 60 Minutes Intravenous Every 24 hours 09/26/23 1123 09/28/23 1427   09/26/23 1230  cefTRIAXone (ROCEPHIN) 2 g in sodium  chloride 0.9 % 100 mL IVPB        2 g 200 mL/hr over 30 Minutes Intravenous Every 24 hours 09/26/23 1123 10/01/23 1122   09/26/23 1230  azithromycin (ZITHROMAX) 500 mg in sodium chloride 0.9 % 250 mL IVPB        500 mg 250 mL/hr over 60 Minutes Intravenous  Once 09/26/23 1123 09/26/23 1753           Data Reviewed:  I have personally reviewed the following...  CBC: Recent Labs  Lab 09/25/23 1711 09/26/23 0426 09/28/23 0416 09/29/23 0505 09/30/23 0007 10/01/23 0402 10/01/23 1017  WBC 13.9*   < > 15.5* 12.0* 12.3* 10.3 10.5  NEUTROABS 7.2  --   --   --   --   --   --   HGB 12.1   < > 11.2* 10.9* 11.2* 11.4* 11.6*  HCT 37.7   < > 34.3* 33.1* 34.1* 34.8* 36.2  MCV 88.7   < > 86.4 85.1 85.3 85.5 85.4  PLT 234   < > 138* 124* 145* 166 196   < > = values in this interval not displayed.   Basic Metabolic Panel: Recent Labs  Lab 09/25/23 1711 09/26/23 0426 09/27/23 1325 09/28/23 0833 09/29/23 0504 09/30/23 0007 10/01/23 1017  NA 136   < > 132* 134* 135 136 139  K 3.7   < > 4.3 3.7 3.3* 3.7 3.1*  CL 104   < > 100 99 98 98 100  CO2 22   < > 17* 26 29 29 29   GLUCOSE 132*   < > 173* 113* 108* 157* 116*  BUN 23*   < > 35* 29* 21* 18 20  CREATININE 1.19*   < > 1.52* 1.09* 0.87  0.82 0.96  CALCIUM 8.7*   < > 8.3* 8.2* 7.8* 8.3* 8.9  MG 2.2  --   --   --   --   --   --    < > = values in this interval not displayed.   GFR: Estimated Creatinine Clearance: 83.2 mL/min (by C-G formula based on SCr of 0.96 mg/dL). Liver Function Tests: Recent Labs  Lab 09/30/23 0007  AST 29  ALT 81*  ALKPHOS 116  BILITOT 0.6  PROT 6.9  ALBUMIN 3.0*   No results for input(s): "LIPASE", "AMYLASE" in the last 168 hours. No results for input(s): "AMMONIA" in the last 168 hours. Coagulation Profile: Recent Labs  Lab 09/26/23 0426  INR 1.2   Cardiac Enzymes: No results for input(s): "CKTOTAL", "CKMB", "CKMBINDEX", "TROPONINI" in the last 168 hours. BNP (last 3 results) No results for input(s): "PROBNP" in the last 8760 hours. HbA1C: No results for input(s): "HGBA1C" in the last 72 hours. CBG: Recent Labs  Lab 09/26/23 0131  GLUCAP 140*   Lipid Profile: No results for input(s): "CHOL", "HDL", "LDLCALC", "TRIG", "CHOLHDL", "LDLDIRECT" in the last 72 hours.  Thyroid Function Tests: No results for input(s): "TSH", "T4TOTAL", "FREET4", "T3FREE", "THYROIDAB" in the last 72 hours.  Anemia Panel: No results for input(s): "VITAMINB12", "FOLATE", "FERRITIN", "TIBC", "IRON", "RETICCTPCT" in the last 72 hours. Most Recent Urinalysis On File:     Component Value Date/Time   COLORURINE YELLOW (A) 11/26/2020 0041   APPEARANCEUR CLEAR (A) 11/26/2020 0041   APPEARANCEUR Cloudy 08/15/2014 0519   LABSPEC 1.026 11/26/2020 0041   LABSPEC 1.017 08/15/2014 0519   PHURINE 5.0 11/26/2020 0041   GLUCOSEU NEGATIVE 11/26/2020 0041   GLUCOSEU Negative 08/15/2014 0519   HGBUR NEGATIVE  11/26/2020 0041   BILIRUBINUR NEGATIVE 11/26/2020 0041   BILIRUBINUR Negative 08/15/2014 0519   KETONESUR NEGATIVE 11/26/2020 0041   PROTEINUR NEGATIVE 11/26/2020 0041   NITRITE NEGATIVE 11/26/2020 0041   LEUKOCYTESUR NEGATIVE 11/26/2020 0041   LEUKOCYTESUR Negative 08/15/2014 0519   Sepsis  Labs: @LABRCNTIP (procalcitonin:4,lacticidven:4) Microbiology: Recent Results (from the past 240 hours)  Resp panel by RT-PCR (RSV, Flu A&B, Covid) Anterior Nasal Swab     Status: None   Collection Time: 09/25/23  5:22 PM   Specimen: Anterior Nasal Swab  Result Value Ref Range Status   SARS Coronavirus 2 by RT PCR NEGATIVE NEGATIVE Final    Comment: (NOTE) SARS-CoV-2 target nucleic acids are NOT DETECTED.  The SARS-CoV-2 RNA is generally detectable in upper respiratory specimens during the acute phase of infection. The lowest concentration of SARS-CoV-2 viral copies this assay can detect is 138 copies/mL. A negative result does not preclude SARS-Cov-2 infection and should not be used as the sole basis for treatment or other patient management decisions. A negative result may occur with  improper specimen collection/handling, submission of specimen other than nasopharyngeal swab, presence of viral mutation(s) within the areas targeted by this assay, and inadequate number of viral copies(<138 copies/mL). A negative result must be combined with clinical observations, patient history, and epidemiological information. The expected result is Negative.  Fact Sheet for Patients:  BloggerCourse.com  Fact Sheet for Healthcare Providers:  SeriousBroker.it  This test is no t yet approved or cleared by the [place] FDA and  has been authorized for detection and/or diagnosis of SARS-CoV-2 by FDA under an Emergency Use Authorization (EUA). This EUA will remain  in effect (meaning this test can be used) for the duration of the COVID-19 declaration under Section 564(b)(1) of the Act, 21 U.S.C.section 360bbb-3(b)(1), unless the authorization is terminated  or revoked sooner.       Influenza A by PCR NEGATIVE NEGATIVE Final   Influenza B by PCR NEGATIVE NEGATIVE Final    Comment: (NOTE) The Xpert Xpress SARS-CoV-2/FLU/RSV plus assay is  intended as an aid in the diagnosis of influenza from Nasopharyngeal swab specimens and should not be used as a sole basis for treatment. Nasal washings and aspirates are unacceptable for Xpert Xpress SARS-CoV-2/FLU/RSV testing.  Fact Sheet for Patients: BloggerCourse.com  Fact Sheet for Healthcare Providers: SeriousBroker.it  This test is not yet approved or cleared by the [place] FDA and has been authorized for detection and/or diagnosis of SARS-CoV-2 by FDA under an Emergency Use Authorization (EUA). This EUA will remain in effect (meaning this test can be used) for the duration of the COVID-19 declaration under Section 564(b)(1) of the Act, 21 U.S.C. section 360bbb-3(b)(1), unless the authorization is terminated or revoked.     Resp Syncytial Virus by PCR NEGATIVE NEGATIVE Final    Comment: (NOTE) Fact Sheet for Patients: BloggerCourse.com  Fact Sheet for Healthcare Providers: SeriousBroker.it  This test is not yet approved or cleared by the [place] FDA and has been authorized for detection and/or diagnosis of SARS-CoV-2 by FDA under an Emergency Use Authorization (EUA). This EUA will remain in effect (meaning this test can be used) for the duration of the COVID-19 declaration under Section 564(b)(1) of the Act, 21 U.S.C. section 360bbb-3(b)(1), unless the authorization is terminated or revoked.  Performed at [facility], [address] [place], [place] [address]   MRSA Next Gen by PCR, Nasal     Status: None   Collection Time: 09/26/23  1:39 AM  Specimen: Nasal Mucosa; Nasal Swab  Result Value Ref Range Status   MRSA by PCR Next Gen NOT DETECTED NOT DETECTED Final    Comment: (NOTE) The GeneXpert MRSA Assay (FDA approved for NASAL specimens only), is one component of a comprehensive MRSA colonization surveillance program. It is not intended to  diagnose MRSA infection nor to guide or monitor treatment for MRSA infections. Test performance is not FDA approved in patients less than 13 years old. Performed at [facility], [address] [place], [place] [address]   Culture, blood (Routine X 2) w Reflex to ID Panel     Status: None   Collection Time: 09/26/23  3:31 AM   Specimen: BLOOD  Result Value Ref Range Status   Specimen Description BLOOD BLOOD LEFT HAND  Final   Special Requests   Final    BOTTLES DRAWN AEROBIC AND ANAEROBIC Blood Culture adequate volume   Culture   Final    NO GROWTH 5 DAYS Performed at [facility], [address] [place], [place] [address]    Report Status 10/01/2023 FINAL  Final  Culture, blood (Routine X 2) w Reflex to ID Panel     Status: None   Collection Time: 09/26/23  3:40 AM   Specimen: BLOOD  Result Value Ref Range Status   Specimen Description BLOOD BLOOD RIGHT HAND  Final   Special Requests   Final    BOTTLES DRAWN AEROBIC AND ANAEROBIC Blood Culture adequate volume   Culture   Final    NO GROWTH 5 DAYS Performed at [facility], [address], [place], [place] [address]    Report Status 10/01/2023 FINAL  Final      Radiology Studies last 3 days: DG Chest Port 1 View Result Date: 09/28/2023 CLINICAL DATA:  [id number] Respiratory distress [id number] EXAM: PORTABLE CHEST 1 VIEW COMPARISON:  September 25, 2023 FINDINGS: The cardiomediastinal silhouette is unchanged and enlarged in contour. Favor small RIGHT pleural effusion. No pneumothorax. Increased diffuse bilateral airspace opacities with vascular indistinctness and peribronchial cuffing. IMPRESSION: 1. Constellation of findings are favored to reflect pulmonary edema, increased. Differential considerations include atypical infection. 2. RIGHT pleural effusion. Electronically Signed   By: Meda Klinefelter M.D.   On: 09/28/2023 08:32       Time spent: 50 min     Sunnie Nielsen, DO [facility] 10/01/2023, 3:10 PM    Dictation software may have been used to generate the above note. Typos may occur and escape review in typed/dictated notes. Please contact Dr Lyn Hollingshead directly for clarity if needed.  Staff may message me via secure chat in Epic  but this may not receive an immediate response,  please page me for urgent matters!  If 7PM-7AM, please contact night coverage [email or web address]

## 2023-10-01 NOTE — Progress Notes (Signed)
 Heart Failure Stewardship Pharmacy Note  PCP: Ronnald Ramp, MD PCP-Cardiologist: Julien Nordmann, MD  HPI: Sandra Hines is a 53 y.o. female with essential hypertension and cerebral aneurysm s/p repair, tobacco use, dyslipidemia, who presented with generalized weakness/malaise over 2 weeks and acute onset palpitations. Found in clinic to be in atrial fibrillation with RVR with BP of 211/154 and rate of 182. On admission, BNP was 700, HS-troponin was 16, free T4 1.16, and TSH was 1.091. Chest x-ray noted mild pulmonary vascular congestion and consistent with prior granulomatous disease. CT PE noted no evidence of thrombus, possible PH, 4.3 cm aortic aneurysm, interstitial edema consistent with edema/CHF. Echocardiogram showed LVEF 45-50% with mild LVH, severely dilated LA, mildly reduced RV function, severe MR, mild-moderate TR. Repeat echo performed today, awaiting results. Cardiology planning to perform cardiac catheterization.  Pertinent Lab Values: Creatinine  Date Value Ref Range Status  08/15/2014 0.80 0.60 - 1.30 mg/dL Final   Creatinine, Ser  Date Value Ref Range Status  10/01/2023 0.96 0.44 - 1.00 mg/dL Final   BUN  Date Value Ref Range Status  10/01/2023 20 6 - 20 mg/dL Final  08/65/7846 16 6 - 24 mg/dL Final  96/29/5284 10 7 - 18 mg/dL Final   Potassium  Date Value Ref Range Status  10/01/2023 3.1 (L) 3.5 - 5.1 mmol/L Final  08/15/2014 3.8 3.5 - 5.1 mmol/L Final   Sodium  Date Value Ref Range Status  10/01/2023 139 135 - 145 mmol/L Final  07/19/2023 139 134 - 144 mmol/L Final  08/15/2014 136 136 - 145 mmol/L Final   B Natriuretic Peptide  Date Value Ref Range Status  09/25/2023 700.0 (H) 0.0 - 100.0 pg/mL Final    Comment:    Performed at [facility], [address] [place], [place] [address]   Magnesium  Date Value Ref Range Status  09/25/2023 2.2 1.7 - 2.4 mg/dL Final    Comment:    Performed at [facility], [address] [place], [place] [address]   Hgb A1c MFr Bld  Date Value Ref Range Status  07/19/2023 5.8 (H) 4.8 - 5.6 % Final    Comment:             Prediabetes: 5.7 - 6.4          Diabetes: >6.4          Glycemic control for adults with diabetes: <7.0    TSH  Date Value Ref Range Status  09/25/2023 1.091 0.350 - 4.500 uIU/mL Final    Comment:    Performed by a 3rd Generation assay with a functional sensitivity of <=0.01 uIU/mL. Performed at [facility], [address] [place], [place] [address]   07/19/2023 0.406 (L) 0.450 - 4.500 uIU/mL Final    Vital Signs: Admission weight: 210 lbs Temp:  [97.6 F (36.4 C)-98 F (36.7 C)] 98 F (36.7 C) (03/11 0859) Pulse Rate:  [58-65] 62 (03/11 0859) Cardiac Rhythm: Normal sinus rhythm (03/11 0700) Resp:  [18-29] 20 (03/11 0859) BP: (113-123)/(74-93) 117/80 (03/11 0859) SpO2:  [94 %-100 %] 100 % (03/11 0859)  Intake/Output Summary (Last 24 hours) at 10/01/2023 1052 Last data filed at 10/01/2023 1041 Gross per 24 hour  Intake 555.83 ml  Output 1400 ml  Net -844.17 ml    Current Heart Failure Medications:  Loop diuretic: none Beta-Blocker: bisoprolol 5 mg daily ACEI/ARB/ARNI: none MRA: none SGLT2i: none Other: amlodipine 10 mg daily  Prior to admission Heart Failure Medications:  Loop diuretic:  none Beta-Blocker: none ACEI/ARB/ARNI: none MRA: none SGLT2i: none Other: amlodipine 10 mg daily  Assessment: 1. Acute systolic heart failure (LVEF 40-45%) with mildly reduced RV function and severe MR, due to unknown etiology. NYHA class III symptoms.  -Symptoms: Reports shortness of breath is improved. Did report an episode of difficulty breathing overnight due to anxiety. Still feels somewhat fatigued. -Volume: No LEE. Creatinine pending. Transitioned to furosemide 40 mg daily. Symptoms are improved. No daily weights recorded.  -Hemodynamics: BP is elevated. HR is now 60-70s in NSR.  -BB: Would avoid aggressive titration of beta  blocker at this time given severe MR and currently in NSR. Continue bisoprolol 5 mg daily. -ACEI/ARB/ARNI: RAASi can be used for afterload reduction in place of PTA amlodipine given new HFmrEF and mildly reduced RV function. Consider starting losartan 25 mg daily tomorrow with a decrease in amlodipine to 5 mg daily. -MRA: Given that ejection fraction is in the mildly reduced category, would likely derive greater benefit from spironolactone. Consider starting spironolactone 25 mg daily today. Patient is hypokalemic. -SGLT2i: Can consider adding [facility] outpatient, to allow time for her hospital copay to be applied to her deductible. If her deductible of $7500 is for medical and pharmacy benefits, then the deductible should be met. Otherwise, may need to find alternatives.  Plan: 1) Medication changes recommended at this time: -Consider adding spironolactone 25 mg daily, which may help with hypokalemia.  -Consider decreasing amlodipine to 5 mg daily. Received 10 mg already this AM. Can consider adding losartan 25 mg daily the at the time amlodipine is reduced. Would aim to stop amlodipine and increase losartan as tolerated.  2) Patient assistance: -Patient has a $7500 dollar deductible. This makes all branded medications very expensive. Entresto copay card would still work, however the other copay cards do not cover deductible. Jardiance requires prior authorization. It is not known whether the patient's deductible is combined medical and pharmacy or pharmacy only.  3) Education: - Patient has been educated on current HF medications and potential additions to HF medication regimen - Patient verbalizes understanding that over the next few months, these medication doses may change and more medications may be added to optimize HF regimen - Patient has been educated on basic disease state pathophysiology and goals of therapy  Medication Assistance / Insurance Benefits Check: Does the patient have  prescription insurance?    Type of insurance plan:  Does the patient qualify for medication assistance through manufacturers or grants? Pending   Outpatient Pharmacy: Prior to admission [facility]   Is the patient willing to utilize a [facility] pharmacy at discharge?: Yes  Please do not hesitate to reach out with questions or concerns,  Enos Fling, PharmD, CPP, BCPS Heart Failure Pharmacist  Phone - [phone] 10/01/2023 10:52 AM

## 2023-10-01 NOTE — Progress Notes (Signed)
 PHARMACY - ANTICOAGULATION CONSULT NOTE  Pharmacy Consult for Heparin Infusion Indication: atrial fibrillation  Allergies  Allergen Reactions   Penicillins Hives    Has patient had a PCN reaction causing immediate rash, facial/tongue/throat swelling, SOB or lightheadedness with hypotension: No Has patient had a PCN reaction causing severe rash involving mucus membranes or skin necrosis: No Has patient had a PCN reaction that required hospitalization: No Has patient had a PCN reaction occurring within the last 10 years: No If all of the above answers are "NO", then may proceed with Cephalosporin use.    Patient Measurements: Height: 5\' 7"  (170.2 cm) Weight: 95.7 kg (210 lb 15.7 oz) IBW/kg (Calculated) : 61.6 Heparin Dosing Weight: 82.6 kg  Vital Signs: Temp: 97.8 F (36.6 C) (03/11 0018) Temp Source: Oral (03/10 1923) BP: 123/92 (03/11 0018) Pulse Rate: 58 (03/11 0018)  Labs: Recent Labs    09/28/23 1610 09/28/23 1324 09/28/23 1912 09/29/23 0504 09/29/23 0505 09/29/23 1115 09/30/23 0007 09/30/23 1151 10/01/23 0402  HGB  --   --    < >  --  10.9*  --  11.2*  --  11.4*  HCT  --   --   --   --  33.1*  --  34.1*  --  34.8*  PLT  --   --   --   --  124*  --  145*  --  166  HEPARINUNFRC  --    < >  --   --  0.32   < > 0.31 0.32 0.41  CREATININE 1.09*  --   --  0.87  --   --  0.82  --   --    < > = values in this interval not displayed.   Estimated Creatinine Clearance: 97.4 mL/min (by C-G formula based on SCr of 0.82 mg/dL).  Medical History: Past Medical History:  Diagnosis Date   Hypertension    Medications:  Scheduled:   amiodarone  400 mg Oral BID   amLODipine  10 mg Oral Daily   bisoprolol  5 mg Oral BID   buPROPion  150 mg Oral Daily   busPIRone  5 mg Oral TID   Chlorhexidine Gluconate Cloth  6 each Topical Daily   DULoxetine  40 mg Oral Daily   furosemide  40 mg Intravenous BID   ipratropium-albuterol  3 mL Nebulization BID   potassium chloride  40 mEq  Oral BID   rosuvastatin  40 mg Oral Daily   Assessment: Sandra Hines is a 53 y.o. female presenting with palpitations. PMH significant for AVM s/p repair, TUD, HTN. Patient was not on [facility] PTA per chart review. Patient with newly diagnosed atrial fibrillation and a CHA2DS2-VASc score of at least 3 (age, hypertension, and heart failure). Cardiology planning to transition to [facility] once it is clear that no invasive procedures will need to be done this admission. Pharmacy has been consulted to initiate and manage heparin infusion.   Baseline Labs: PT 15.0, INR 1.2, Hgb 12.3, Hct 37.9, Plt 215   Goal of Therapy:  Heparin level 0.3-0.7 units/ml Monitor platelets by anticoagulation protocol: Yes   Date Time HL Rate/Comment  3/6 1745 < 0.10 1300/subtherapeutic 3/7 0259 0.60 1550/therapeutic x1 3/7 0836 0.54 1550/therapeutic x2  3/8 0416 0.24 1550/subtherapeutic  3/8 1324 0.33 1750/therapeutic x1  3/8 1912 0.24 1750/subtherapeutic 3/9 0505 0.32 1900/therapeutic x 1 3/9 1115 0.24 1900/subtherapeutic 3/9 1757 0.36 2050/therapeutic x1 3/10 0007 0.31 2050/therapeutic x 2 3/11     0402  0.41    2050/therapeutic X 4  Plan: heparin level therapeutic x 4 Continue heparin infusion at 2050 units/hr Recheck next heparin level in am 03/12 Continue to monitor H&H and platelets daily while on heparin infusion   Derrian Poli D 10/01/2023 6:05 AM

## 2023-10-01 NOTE — Progress Notes (Signed)
 Rounding Note    Patient Name: Sandra Hines Date of Encounter: 10/01/2023  [facility] Cardiologist: Julien Nordmann, MD   Subjective   Patient reports improvements in her breathing. She denies chest pain and shortness of breath. Appears euvolemic on exam. Remains in sinus rhythm. Limited echo done today.   Inpatient Medications    Scheduled Meds:  amiodarone  400 mg Oral BID   amLODipine  10 mg Oral Daily   bisoprolol  5 mg Oral BID   buPROPion  150 mg Oral Daily   busPIRone  5 mg Oral TID   Chlorhexidine Gluconate Cloth  6 each Topical Daily   DULoxetine  40 mg Oral Daily   furosemide  40 mg Intravenous BID   ipratropium-albuterol  3 mL Nebulization BID   potassium chloride  40 mEq Oral BID   rosuvastatin  40 mg Oral Daily   Continuous Infusions:  cefTRIAXone (ROCEPHIN)  IV 200 mL/hr at 09/30/23 1153   heparin 2,050 Units/hr (10/01/23 0548)   PRN Meds: acetaminophen, cyclobenzaprine, guaiFENesin-codeine, ipratropium-albuterol, LORazepam, magnesium hydroxide, morphine injection, nitroGLYCERIN, ondansetron (ZOFRAN) IV, traZODone   Vital Signs    Vitals:   09/30/23 2005 09/30/23 2106 10/01/23 0018 10/01/23 0859  BP:   (!) 123/92 117/80  Pulse:  65 (!) 58 62  Resp:   18 20  Temp:   97.8 F (36.6 C) 98 F (36.7 C)  TempSrc:      SpO2: 97% 100% 94% 100%  Weight:      Height:        Intake/Output Summary (Last 24 hours) at 10/01/2023 0906 Last data filed at 10/01/2023 0300 Gross per 24 hour  Intake 472.72 ml  Output 900 ml  Net -427.28 ml      09/26/2023    1:37 AM 09/25/2023    5:03 PM 09/25/2023    4:04 PM  Last 3 Weights  Weight (lbs) 210 lb 15.7 oz 209 lb 210 lb 4.8 oz  Weight (kg) 95.7 kg 94.802 kg 95.391 kg      Telemetry    Sinus rhythm rate 55-65 bpm - Personally Reviewed  Physical Exam   GEN: No acute distress.   Neck: No JVD Cardiac: RRR, 3/6 systolic murmur, rubs, or gallops.  Respiratory: Clear to auscultation bilaterally. GI:  Soft, nontender, non-distended  MS: No edema; No deformity. Neuro:  Nonfocal  Psych: Normal affect   Labs    High Sensitivity Troponin:   Recent Labs  Lab 09/25/23 1711 09/27/23 1326 09/27/23 1440  TROPONINIHS 16 16 16      Chemistry Recent Labs  Lab 09/25/23 1711 09/26/23 0426 09/28/23 0833 09/29/23 0504 09/30/23 0007  NA 136   < > 134* 135 136  K 3.7   < > 3.7 3.3* 3.7  CL 104   < > 99 98 98  CO2 22   < > 26 29 29   GLUCOSE 132*   < > 113* 108* 157*  BUN 23*   < > 29* 21* 18  CREATININE 1.19*   < > 1.09* 0.87 0.82  CALCIUM 8.7*   < > 8.2* 7.8* 8.3*  MG 2.2  --   --   --   --   PROT  --   --   --   --  6.9  ALBUMIN  --   --   --   --  3.0*  AST  --   --   --   --  29  ALT  --   --   --   --  81*  ALKPHOS  --   --   --   --  116  BILITOT  --   --   --   --  0.6  GFRNONAA 56*   < > >60 >60 >60  ANIONGAP 10   < > 9 8 9    < > = values in this interval not displayed.    Lipids  Recent Labs  Lab 09/26/23 0426  CHOL 175  TRIG 79  HDL 41  LDLCALC 118*  CHOLHDL 4.3    Hematology Recent Labs  Lab 09/29/23 0505 09/30/23 0007 10/01/23 0402  WBC 12.0* 12.3* 10.3  RBC 3.89 4.00 4.07  HGB 10.9* 11.2* 11.4*  HCT 33.1* 34.1* 34.8*  MCV 85.1 85.3 85.5  MCH 28.0 28.0 28.0  MCHC 32.9 32.8 32.8  RDW 14.6 14.6 14.6  PLT 124* 145* 166   Thyroid  Recent Labs  Lab 09/25/23 1711  TSH 1.091  FREET4 1.16*    BNP Recent Labs  Lab 09/25/23 1711  BNP 700.0*    DDimer No results for input(s): "DDIMER" in the last 168 hours.   Radiology    No results found.  Cardiac Studies   09/26/2023 Echo complete  1. Left ventricular ejection fraction, by estimation, is 45 to 50%. The  left ventricle has mildly decreased function. The left ventricle  demonstrates global hypokinesis. The left ventricular internal cavity size  was mildly dilated. There is mild left  ventricular hypertrophy. Left ventricular diastolic parameters are  indeterminate.   2. Right ventricular  systolic function is mildly reduced. The right  ventricular size is normal. There is normal pulmonary artery systolic  pressure. The estimated right ventricular systolic pressure is 25.8 mmHg.   3. Left atrial size was severely dilated.   4. 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 No  evidence of mitral stenosis. The mean mitral valve gradient is 11.0 mmHg.  Moderate mitral annular calcification.   5. Tricuspid valve regurgitation is mild to moderate.   6. The aortic valve is tricuspid. Aortic valve regurgitation is mild. No  aortic stenosis is present.   7. There is borderline dilatation of the ascending aorta, measuring 38  mm.   8. The inferior vena cava is normal in size with greater than 50%  respiratory variability, suggesting right atrial pressure of 3 mmHg.   Patient Profile     53 y.o. female HTN, cerebral aneurysm s/p repair who is being seen for the continued evaluation of atrial fibrillation and acute systolic CHF.   Assessment & Plan    Atrial fibrillation with RVR - Presented 3/5 with new onset afib RVR, difficult to control on IV diltiazem infusion. Started on amiodarone infusion, bisoprolol, and IV digoxin with conversion to NSR 3/6 - Echo with LVEF 45-50%, dilated left atrium and severe MR - CHA2DS2VASc 3 - Telemetry shows sinus rhythm - Continue IV heparin - Continue oral amiodarone 400 mg BID x 7 days, 200 mg BID x 7 days, 200 mg daily thereafter (transitioned yesterday from IV) and bisoprolol 5 mg BID  Acute respiratory distress Viral USI/bronchitis - Presented with chest congestion x 2 weeks - Chest CT with possible PNA - Remains on 2 L supplemental O2 - Management of steroids, nebs, and antibiotics per IM  Acute systolic CHF - Echo with LVEF 11-91%, global HK, mild LVH, mildly reduced RVSF, rheumatic MV, severe MR, mild to moderate TR, and severely dilated left atrium - IV Lasix 40 mg BID restarted 3/8 with net  output -4.8 L since  admission - Appears euvolemic on exam - Will transition from IV to oral Lasix 40 mg daily - Consider addition of ARB and spironolactone prior to discharge - Consideration for [facility] during this admission to further evaluate heart valve and low EF, patient is agreeable - Repeat echo today with further recommendations pending results  Severe MR - Rheumatic appearing valve with severely dilated left atrium - Repeat echo today, further recommendations pending results  Anxiety - PRN ativan  For questions or updates, please contact [facility] Please consult [email or web address] for contact info under     Signed, Orion Crook, PA-C  10/01/2023, 9:06 AM

## 2023-10-01 NOTE — Progress Notes (Signed)
*  PRELIMINARY RESULTS* Echocardiogram 2D Echocardiogram has been performed.  Cristela Blue 10/01/2023, 8:17 AM

## 2023-10-01 NOTE — TOC Progression Note (Signed)
 Transition of Care [facility]) - Progression Note    Patient Details  Name: Sandra Hines MRN: [id number] Date of Birth: 04-Feb-1971  [facility] CM/SW Contact  Truddie Hidden, RN Phone Number: 10/01/2023, 10:23 AM  Clinical Narrative:    TOC continuing to follow patient's progress throughout discharge planning.   Expected Discharge Plan: Home/Self Care Barriers to Discharge: No Barriers Identified  Expected Discharge Plan and Services   Discharge Planning Services: CM Consult   Living arrangements for the past 2 months: Single Family Home                                       Social Determinants of Health (SDOH) Interventions SDOH Screenings   Food Insecurity: No Food Insecurity (09/26/2023)  Housing: Low Risk  (09/30/2023)  Transportation Needs: No Transportation Needs (09/30/2023)  Utilities: Not At Risk (09/26/2023)  Depression (PHQ2-9): High Risk (09/25/2023)  Financial Resource Strain: Medium Risk (09/30/2023)  Tobacco Use: High Risk (09/30/2023)    Readmission Risk Interventions     No data to display

## 2023-10-01 NOTE — Plan of Care (Signed)
  Problem: Education: Goal: Knowledge of disease or condition will improve Outcome: Progressing   Problem: Cardiac: Goal: Ability to achieve and maintain adequate cardiopulmonary perfusion will improve Outcome: Progressing   Problem: Clinical Measurements: Goal: Cardiovascular complication will be avoided Outcome: Progressing   Problem: Pain Managment: Goal: General experience of comfort will improve and/or be controlled Outcome: Progressing

## 2023-10-01 NOTE — Evaluation (Signed)
 Occupational Therapy Evaluation Patient Details Name: Sandra Hines MRN: [id number] DOB: July 15, 1971 Today's Date: 10/01/2023   History of Present Illness   53 y/o female presented to ED on 09/25/23 from MD office for Afib with RVR. Admitted for acute CHF in setting of severe mitral regurgitation. PMH: HTN, cerebral aneurysm s/p repair     Clinical Impressions Pt pleasant and participatory, RN in room for meds with pt reporting 8/10 HA. PTA, pt reports she was independent in ADLs/IADLs including working & driving. She has children that can assist her at home upon hospital discharge. Pt presents to acute OT with deficits in functional activity tolerance limiting baseline ADL performance. Bed mobility mod independent with HOB elevated and use of bed rails, transfers/functional mobility/ADLs performed with supervision. Pt completes tolieting tasks, stands at sink for grooming activities and performs UB/LB bathing from sit - stands. 1-2 instances of mild LOB/unsteadiness that pt is able to self-correct with hand placement on sink. Able to mobilize in room without AD pushing IV pole for support. Pt would benefit from skilled OT services to address noted impairments and functional limitations (see below for any additional details) in order to maximize safety and independence while minimizing falls risk and caregiver burden. Anticipate the need for follow up [facility] OT services upon acute hospital DC.      If plan is discharge home, recommend the following:   A little help with walking and/or transfers;A little help with bathing/dressing/bathroom;Assist for transportation     Functional Status Assessment   Patient has had a recent decline in their functional status and demonstrates the ability to make significant improvements in function in a reasonable and predictable amount of time.     Equipment Recommendations   None recommended by OT      Precautions/Restrictions   Restrictions Weight  Bearing Restrictions Per Provider Order: No     Mobility Bed Mobility Overal bed mobility: Modified Independent             General bed mobility comments: supine-sit, sit-supine (use of bed rails)    Transfers Overall transfer level: Needs assistance Equipment used: None Transfers: Sit to/from Stand, Bed to chair/wheelchair/BSC Sit to Stand: Supervision     Step pivot transfers: Supervision     General transfer comment: 4-5 STS transfers observed throughout session, pt using grab bars for STS transfer off regular height commode      Balance Overall balance assessment: Mild deficits observed, not formally tested                                         ADL either performed or assessed with clinical judgement   ADL Overall ADL's : Needs assistance/impaired     Grooming: Wash/dry face;Wash/dry hands;Applying deodorant;Standing;Supervision/safety Grooming Details (indicate cue type and reason): sink level, SUP for safety, pt with 1-2 instances of mild unsteadiness requiring hand placement on sink to correct Upper Body Bathing: Supervision/ safety;Standing   Lower Body Bathing: Sit to/from stand Lower Body Bathing Details (indicate cue type and reason): sitting on regular height toilet         Toilet Transfer: Supervision/safety;Grab bars;Ambulation Toilet Transfer Details (indicate cue type and reason): pt pushing IV pole t/f bathroom Toileting- Clothing Manipulation and Hygiene: Supervision/safety;Sit to/from stand       Functional mobility during ADLs: Supervision/safety (in room, pushing IV pole for support)        Pertinent  Vitals/Pain Pain Assessment Pain Assessment: 0-10 Pain Score: 8  Pain Location: HA Pain Descriptors / Indicators: Aching, Headache Pain Intervention(s): Limited activity within patient's tolerance, Monitored during session (RN in room for meds upon arrival)     Extremity/Trunk Assessment Upper Extremity  Assessment Upper Extremity Assessment: Overall WFL for tasks assessed   Lower Extremity Assessment Lower Extremity Assessment: Overall WFL for tasks assessed   Cervical / Trunk Assessment Cervical / Trunk Assessment: Normal   Communication Communication Communication: No apparent difficulties   Cognition Arousal: Alert Behavior During Therapy: WFL for tasks assessed/performed Cognition: No apparent impairments                               Following commands: Intact       Cueing  General Comments      HR 60-80s, Pt found on 2L O2, removed on RA for ADLs in bathroom, sats dropping to 90, placed back on 1L O2 with sats 94%>. RN aware           Home Living Family/patient expects to be discharged to:: Private residence Living Arrangements: Children Available Help at Discharge: Available 24 hours/day Type of Home: House Home Access: Level entry     Home Layout: One level     Bathroom Shower/Tub: Chief Strategy Officer: Standard     Home Equipment: None          Prior Functioning/Environment Prior Level of Function : Independent/Modified Independent;Driving             Mobility Comments: independent ADLs Comments: independent ADLs + IADLs, driving, working    OT Problem List: Decreased activity tolerance;Cardiopulmonary status limiting activity;Pain;Impaired balance (sitting and/or standing);Decreased knowledge of use of DME or AE   OT Treatment/Interventions: Self-care/ADL training;Neuromuscular education;Energy conservation;DME and/or AE instruction;Therapeutic activities;Patient/family education;Balance training      OT Goals(Current goals can be found in the care plan section)   Acute Rehab OT Goals OT Goal Formulation: With patient Time For Goal Achievement: 10/15/23 Potential to Achieve Goals: Good   OT Frequency:  Min 2X/week       AM-PAC OT "6 Clicks" Daily Activity     Outcome Measure Help from another  person eating meals?: None Help from another person taking care of personal grooming?: None Help from another person toileting, which includes using toliet, bedpan, or urinal?: A Little Help from another person bathing (including washing, rinsing, drying)?: A Little Help from another person to put on and taking off regular upper body clothing?: None Help from another person to put on and taking off regular lower body clothing?: A Little 6 Click Score: 21   End of Session Equipment Utilized During Treatment: Oxygen Nurse Communication: Mobility status (O2 down to 1L, urine output)  Activity Tolerance: Patient tolerated treatment well Patient left: in bed;with call bell/phone within reach;with nursing/sitter in room  OT Visit Diagnosis: Unsteadiness on feet (R26.81)                Time: 1610-9604 OT Time Calculation (min): 34 min Charges:  OT General Charges $OT Visit: 1 Visit OT Evaluation $OT Eval Low Complexity: 1 Low OT Treatments $Self Care/Home Management : 23-37 mins  Lawerence Dery L. Fahima Cifelli, OTR/L  10/01/23, 10:13 AM

## 2023-10-01 NOTE — Plan of Care (Signed)

## 2023-10-01 NOTE — Evaluation (Signed)
 Physical Therapy Evaluation Patient Details Name: Sandra Hines MRN: [id number] DOB: 10-Nov-1970 Today's Date: 10/01/2023  History of Present Illness  53 y/o female presented to ED on 09/25/23 from MD office for Afib with RVR. Admitted for acute CHF in setting of severe mitral regurgitation. PMH: HTN, cerebral aneurysm s/p repair  Clinical Impression  Patient admitted with the above. PTA, patient was independent and living with her children. Patient completed all mobility modI. Ambulatory in hallway with IV pole initially but able to ambulate with no AD. Encouraged ambulation in hallway 3-4x/day to improve activity tolerance and general strength, patient agreeable. No further skilled PT needs identified acutely. Will defer further mobility to nursing staff and mobility specialists. PT will sign off.       If plan is discharge home, recommend the following:     Can travel by private vehicle        Equipment Recommendations None recommended by PT  Recommendations for Other Services       Functional Status Assessment Patient has had a recent decline in their functional status and demonstrates the ability to make significant improvements in function in a reasonable and predictable amount of time.     Precautions / Restrictions Precautions Precautions: None Restrictions Weight Bearing Restrictions Per Provider Order: No      Mobility  Bed Mobility Overal bed mobility: Modified Independent                  Transfers Overall transfer level: Modified independent Equipment used: None                    Ambulation/Gait Ambulation/Gait assistance: Modified independent (Device/Increase time) Gait Distance (Feet): 150 Feet Assistive device: IV Pole, None Gait Pattern/deviations: Step-through pattern, Decreased stride length Gait velocity: decreased     General Gait Details: slow gait speed with use of IV pole initially but able to ambulate with no AD  Stairs             Wheelchair Mobility     Tilt Bed    Modified Rankin (Stroke Patients Only)       Balance Overall balance assessment: Mild deficits observed, not formally tested                                           Pertinent Vitals/Pain Pain Assessment Pain Assessment: Faces Faces Pain Scale: Hurts a little bit Pain Location: HA Pain Descriptors / Indicators: Headache Pain Intervention(s): Limited activity within patient's tolerance, Monitored during session    Home Living Family/patient expects to be discharged to:: Private residence Living Arrangements: Children Available Help at Discharge: Available 24 hours/day Type of Home: House Home Access: Level entry       Home Layout: One level Home Equipment: None      Prior Function Prior Level of Function : Independent/Modified Independent;Driving             Mobility Comments: independent ADLs Comments: independent ADLs + IADLs, driving, working     Extremity/Trunk Assessment   Upper Extremity Assessment Upper Extremity Assessment: Overall WFL for tasks assessed    Lower Extremity Assessment Lower Extremity Assessment: Overall WFL for tasks assessed    Cervical / Trunk Assessment Cervical / Trunk Assessment: Normal  Communication   Communication Communication: No apparent difficulties    Cognition Arousal: Alert Behavior During Therapy: WFL for tasks assessed/performed  PT - Cognitive impairments: No apparent impairments                         Following commands: Intact       Cueing       General Comments General comments (skin integrity, edema, etc.): SpO2 >90% throughout on RA    Exercises     Assessment/Plan    PT Assessment Patient does not need any further PT services  PT Problem List         PT Treatment Interventions      PT Goals (Current goals can be found in the Care Plan section)  Acute Rehab PT Goals Patient Stated Goal: to get  stronger PT Goal Formulation: All assessment and education complete, DC therapy    Frequency       Co-evaluation               AM-PAC PT "6 Clicks" Mobility  Outcome Measure Help needed turning from your back to your side while in a flat bed without using bedrails?: None Help needed moving from lying on your back to sitting on the side of a flat bed without using bedrails?: None Help needed moving to and from a bed to a chair (including a wheelchair)?: None Help needed standing up from a chair using your arms (e.g., wheelchair or bedside chair)?: None Help needed to walk in hospital room?: None Help needed climbing 3-5 steps with a railing? : None 6 Click Score: 24    End of Session   Activity Tolerance: Patient tolerated treatment well Patient left: in bed;with call bell/phone within reach;with family/visitor present Nurse Communication: Mobility status PT Visit Diagnosis: Muscle weakness (generalized) (M62.81)    Time: 1026-1040 PT Time Calculation (min) (ACUTE ONLY): 14 min   Charges:   PT Evaluation $PT Eval Moderate Complexity: 1 Mod   PT General Charges $$ ACUTE PT VISIT: 1 Visit         Maylon Peppers, PT, DPT Physical Therapist - [facility]  [facility]   Jeter Tomey A Amorina Doerr 10/01/2023, 11:15 AM

## 2023-10-02 ENCOUNTER — Inpatient Hospital Stay: Admitting: Anesthesiology

## 2023-10-02 ENCOUNTER — Inpatient Hospital Stay (HOSPITAL_COMMUNITY)
Admit: 2023-10-02 | Discharge: 2023-10-02 | Disposition: A | Discharge status: Home / Self Care | Attending: Physician Assistant | Admitting: Physician Assistant

## 2023-10-02 ENCOUNTER — Encounter
Admission: EM | Disposition: A | Discharge status: Other Acute Inpatient Hospital | Payer: Self-pay | Source: Ambulatory Visit | Attending: Osteopathic Medicine

## 2023-10-02 ENCOUNTER — Other Ambulatory Visit (HOSPITAL_COMMUNITY): Payer: Self-pay

## 2023-10-02 DIAGNOSIS — I340 Nonrheumatic mitral (valve) insufficiency: Secondary | ICD-10-CM | POA: Diagnosis not present

## 2023-10-02 DIAGNOSIS — I4891 Unspecified atrial fibrillation: Secondary | ICD-10-CM | POA: Diagnosis not present

## 2023-10-02 DIAGNOSIS — J208 Acute bronchitis due to other specified organisms: Secondary | ICD-10-CM | POA: Diagnosis not present

## 2023-10-02 DIAGNOSIS — I10 Essential (primary) hypertension: Secondary | ICD-10-CM | POA: Diagnosis not present

## 2023-10-02 HISTORY — PX: TEE WITHOUT CARDIOVERSION: SHX5443

## 2023-10-02 LAB — RENAL FUNCTION PANEL
Albumin: 2.9 g/dL — ABNORMAL LOW (ref 3.5–5.0)
Anion gap: 9 (ref 5–15)
BUN: 21 mg/dL — ABNORMAL HIGH (ref 6–20)
CO2: 27 mmol/L (ref 22–32)
Calcium: 8.7 mg/dL — ABNORMAL LOW (ref 8.9–10.3)
Chloride: 103 mmol/L (ref 98–111)
Creatinine, Ser: 0.91 mg/dL (ref 0.44–1.00)
GFR, Estimated: >60 mL/min (ref >60)
Glucose, Bld: 100 mg/dL — ABNORMAL HIGH (ref 70–99)
Phosphorus: 3.5 mg/dL (ref 2.5–4.6)
Potassium: 3.6 mmol/L (ref 3.5–5.1)
Sodium: 139 mmol/L (ref 135–145)

## 2023-10-02 LAB — HEPARIN LEVEL (UNFRACTIONATED): Heparin Unfractionated: 0.4 [IU]/mL (ref 0.30–0.70)

## 2023-10-02 SURGERY — ECHOCARDIOGRAM, TRANSESOPHAGEAL
Anesthesia: General

## 2023-10-02 MED ORDER — PROPOFOL 10 MG/ML IV BOLUS
INTRAVENOUS | Status: DC | PRN
Start: 2023-10-02 — End: 2023-10-02
  Administered 2023-10-02: 50 mg via INTRAVENOUS
  Administered 2023-10-02 (×7): 20 mg via INTRAVENOUS
  Administered 2023-10-02: 10 mg via INTRAVENOUS
  Administered 2023-10-02: 20 mg via INTRAVENOUS

## 2023-10-02 MED ORDER — SODIUM CHLORIDE 0.9 % IV SOLN: INTRAVENOUS | Status: DC

## 2023-10-02 MED ORDER — BUTAMBEN-TETRACAINE-BENZOCAINE 2-2-14 % EX AERO
INHALATION_SPRAY | CUTANEOUS | Status: AC
  Filled 2023-10-02: qty 5

## 2023-10-02 MED ORDER — LIDOCAINE VISCOUS HCL 2 % MT SOLN
OROMUCOSAL | Status: AC
  Filled 2023-10-02: qty 15

## 2023-10-02 MED ORDER — POTASSIUM CHLORIDE CRYS ER 20 MEQ PO TBCR
40.0000 meq | EXTENDED_RELEASE_TABLET | Freq: Every day | ORAL | Status: DC
  Administered 2023-10-02 – 2023-10-03 (×2): 40 meq via ORAL
  Filled 2023-10-02 (×2): qty 2

## 2023-10-02 MED ORDER — ACETAMINOPHEN-CAFFEINE 500-65 MG PO TABS
2.0000 | ORAL_TABLET | Freq: Three times a day (TID) | ORAL | Status: DC | PRN
  Administered 2023-10-03: 2 via ORAL
  Filled 2023-10-02 (×2): qty 2

## 2023-10-02 MED ORDER — IPRATROPIUM-ALBUTEROL 0.5-2.5 (3) MG/3ML IN SOLN
RESPIRATORY_TRACT | Status: AC
  Filled 2023-10-02: qty 3

## 2023-10-02 NOTE — Transfer of Care (Signed)
 Immediate Anesthesia Transfer of Care Note  Patient: Sandra Hines  Procedure(s) Performed: ECHOCARDIOGRAM, TRANSESOPHAGEAL  Patient Location: PACU  Anesthesia Type:General  Level of Consciousness: drowsy and patient cooperative  Airway & Oxygen Therapy: Patient Spontanous Breathing and Patient connected to nasal cannula oxygen  Post-op Assessment: Report given to RN and Post -op Vital signs reviewed and stable  Post vital signs: Reviewed and stable  Last Vitals:  Vitals Value Taken Time  BP 115/80 10/02/23 1318  Temp    Pulse 58 10/02/23 1319  Resp 26 10/02/23 1319  SpO2 91 % 10/02/23 1319  Vitals shown include unfiled device data.  Last Pain:  Vitals:   10/02/23 1218  TempSrc: Oral  PainSc: 0-No pain      Patients Stated Pain Goal: 0 (09/27/23 1300)  Complications: No notable events documented.

## 2023-10-02 NOTE — Plan of Care (Signed)
  Problem: Education: Goal: Knowledge of disease or condition will improve Outcome: Progressing Goal: Understanding of medication regimen will improve Outcome: Progressing   Problem: Activity: Goal: Ability to tolerate increased activity will improve Outcome: Progressing   Problem: Cardiac: Goal: Ability to achieve and maintain adequate cardiopulmonary perfusion will improve Outcome: Progressing   Problem: Health Behavior/Discharge Planning: Goal: Ability to safely manage health-related needs after discharge will improve Outcome: Progressing   Problem: Clinical Measurements: Goal: Ability to maintain clinical measurements within normal limits will improve Outcome: Progressing   Problem: Activity: Goal: Risk for activity intolerance will decrease Outcome: Progressing   Problem: Nutrition: Goal: Adequate nutrition will be maintained Outcome: Progressing   Problem: Safety: Goal: Ability to remain free from injury will improve Outcome: Progressing

## 2023-10-02 NOTE — Plan of Care (Signed)
  Problem: Education: Goal: Knowledge of disease or condition will improve Outcome: Progressing Goal: Understanding of medication regimen will improve Outcome: Progressing Goal: Individualized Educational Video(s) Outcome: Progressing   Problem: Activity: Goal: Ability to tolerate increased activity will improve Outcome: Progressing   Problem: Cardiac: Goal: Ability to achieve and maintain adequate cardiopulmonary perfusion will improve Outcome: Progressing   Problem: Health Behavior/Discharge Planning: Goal: Ability to safely manage health-related needs after discharge will improve Outcome: Progressing   Problem: Education: Goal: Knowledge of General Education information will improve Description: Including pain rating scale, medication(s)/side effects and non-pharmacologic comfort measures Outcome: Progressing   Problem: Health Behavior/Discharge Planning: Goal: Ability to manage health-related needs will improve Outcome: Progressing   Problem: Clinical Measurements: Goal: Ability to maintain clinical measurements within normal limits will improve Outcome: Progressing Goal: Will remain free from infection Outcome: Progressing Goal: Diagnostic test results will improve Outcome: Progressing Goal: Respiratory complications will improve Outcome: Progressing Goal: Cardiovascular complication will be avoided Outcome: Progressing   Problem: Activity: Goal: Risk for activity intolerance will decrease Outcome: Progressing   Problem: Nutrition: Goal: Adequate nutrition will be maintained Outcome: Progressing   Problem: Coping: Goal: Level of anxiety will decrease Outcome: Progressing   Problem: Elimination: Goal: Will not experience complications related to bowel motility Outcome: Progressing Goal: Will not experience complications related to urinary retention Outcome: Progressing   Problem: Pain Managment: Goal: General experience of comfort will improve and/or be  controlled Outcome: Progressing   Problem: Safety: Goal: Ability to remain free from injury will improve Outcome: Progressing   Problem: Skin Integrity: Goal: Risk for impaired skin integrity will decrease Outcome: Progressing   Problem: Education: Goal: Understanding of CV disease, CV risk reduction, and recovery process will improve Outcome: Progressing Goal: Individualized Educational Video(s) Outcome: Progressing   Problem: Activity: Goal: Ability to return to baseline activity level will improve Outcome: Progressing   Problem: Cardiovascular: Goal: Ability to achieve and maintain adequate cardiovascular perfusion will improve Outcome: Progressing Goal: Vascular access site(s) Level 0-1 will be maintained Outcome: Progressing   Problem: Health Behavior/Discharge Planning: Goal: Ability to safely manage health-related needs after discharge will improve Outcome: Progressing

## 2023-10-02 NOTE — Progress Notes (Signed)
 Heart Failure Stewardship Pharmacy Note  PCP: Ronnald Ramp, MD PCP-Cardiologist: Julien Nordmann, MD  HPI: Sandra Hines is a 53 y.o. female with essential hypertension and cerebral aneurysm s/p repair, tobacco use, dyslipidemia, who presented with generalized weakness/malaise over 2 weeks and acute onset palpitations. Found in clinic to be in atrial fibrillation with RVR with BP of 211/154 and rate of 182. On admission, BNP was 700, HS-troponin was 16, free T4 1.16, and TSH was 1.091. Chest x-ray noted mild pulmonary vascular congestion and consistent with prior granulomatous disease. CT PE noted no evidence of thrombus, possible PH, 4.3 cm aortic aneurysm, interstitial edema consistent with edema/CHF. Echocardiogram showed LVEF 45-50% with mild LVH, severely dilated LA, mildly reduced RV function, severe MR, mild-moderate TR. Repeat echo after diuresis showed persistent MR. Cardiology planning to perform TEE today and cardiac catheterization tomorrow.  Pertinent Lab Values: Creatinine  Date Value Ref Range Status  08/15/2014 0.80 0.60 - 1.30 mg/dL Final   Creatinine, Ser  Date Value Ref Range Status  10/02/2023 0.91 0.44 - 1.00 mg/dL Final   BUN  Date Value Ref Range Status  10/02/2023 21 (H) 6 - 20 mg/dL Final  25/95/6387 16 6 - 24 mg/dL Final  56/43/3295 10 7 - 18 mg/dL Final   Potassium  Date Value Ref Range Status  10/02/2023 3.6 3.5 - 5.1 mmol/L Final  08/15/2014 3.8 3.5 - 5.1 mmol/L Final   Sodium  Date Value Ref Range Status  10/02/2023 139 135 - 145 mmol/L Final  07/19/2023 139 134 - 144 mmol/L Final  08/15/2014 136 136 - 145 mmol/L Final   B Natriuretic Peptide  Date Value Ref Range Status  09/25/2023 700.0 (H) 0.0 - 100.0 pg/mL Final    Comment:    Performed at [facility], [address] [place], [place] [address]   Magnesium  Date Value Ref Range Status  09/25/2023 2.2 1.7 - 2.4 mg/dL Final    Comment:    Performed at [facility], [address] [place], [place] [address]   Hgb A1c MFr Bld  Date Value Ref Range Status  07/19/2023 5.8 (H) 4.8 - 5.6 % Final    Comment:             Prediabetes: 5.7 - 6.4          Diabetes: >6.4          Glycemic control for adults with diabetes: <7.0    TSH  Date Value Ref Range Status  09/25/2023 1.091 0.350 - 4.500 uIU/mL Final    Comment:    Performed by a 3rd Generation assay with a functional sensitivity of <=0.01 uIU/mL. Performed at [facility], [address] [place], [place] [address]   07/19/2023 0.406 (L) 0.450 - 4.500 uIU/mL Final    Vital Signs: Admission weight: 210 lbs Temp:  [97.8 F (36.6 C)-98.3 F (36.8 C)] 98.3 F (36.8 C) (03/12 1218) Pulse Rate:  [59-63] 59 (03/12 1241) Cardiac Rhythm: Sinus bradycardia (03/12 1236) Resp:  [17-21] 17 (03/12 1241) BP: (112-132)/(70-101) 121/88 (03/12 1218) SpO2:  [96 %-99 %] 99 % (03/12 1241) Weight:  [89.7 kg (197 lb 12 oz)] 89.7 kg (197 lb 12 oz) (03/11 2300)  Intake/Output Summary (Last 24 hours) at 10/02/2023 1315 Last data filed at 10/02/2023 0932 Gross per 24 hour  Intake 863.94 ml  Output --  Net 863.94 ml    Current Heart Failure Medications:  Loop diuretic: none Beta-Blocker: bisoprolol 5 mg daily ACEI/ARB/ARNI: none  MRA: none SGLT2i: none Other: amlodipine 10 mg daily  Prior to admission Heart Failure Medications:  Loop diuretic: none Beta-Blocker: none ACEI/ARB/ARNI: none MRA: none SGLT2i: none Other: amlodipine 10 mg daily  Assessment: 1. Acute systolic heart failure (LVEF 40-45%) with mildly reduced RV function and severe MR, due to unknown etiology. NYHA class III symptoms.  -Symptoms: Reports shortness of breath is improved. Did report an episode of difficulty breathing overnight due to anxiety. Still feels somewhat fatigued. -Volume: No LEE. Creatinine pending. Transitioned to furosemide 40 mg daily. Symptoms are improved. No daily weights recorded.  -Hemodynamics:  BP is elevated. HR is now 50-60s in NSR.  -BB: Would avoid aggressive titration of beta blocker at this time given severe MR and currently in NSR. Continue bisoprolol 5 mg daily. May even benefit from dose reduction if NSR maintained due to HR. -ACEI/ARB/ARNI: RAASi can be used for afterload reduction in place of PTA amlodipine given new HFmrEF and mildly reduced RV function. Consider starting losartan 25 mg daily tomorrow and stopping amlodipine. -MRA: Given that ejection fraction is in the mildly reduced category, would likely derive greater benefit from spironolactone. Consider starting spironolactone 25 mg daily today. Patient is hypokalemic. -SGLT2i: Can consider adding [facility] outpatient, to allow time for her hospital copay to be applied to her deductible. If her deductible of $7500 is for medical and pharmacy benefits, then the deductible should be met. Otherwise, may need to find alternatives.  Plan: 1) Medication changes recommended at this time: -Consider adding spironolactone 25 mg daily, which may help with hypokalemia.  -Consider stopping amlodipine and adding losartan 25 mg daily tomorrow.  2) Patient assistance: -Patient has a $7500 dollar deductible. This makes all branded medications very expensive. Entresto copay card would still work, however the other copay cards do not cover deductible. [facility]iance requires prior authorization. It is not known whether the patient's deductible is combined medical and pharmacy or pharmacy only.  3) Education: - Patient has been educated on current HF medications and potential additions to HF medication regimen - Patient verbalizes understanding that over the next few months, these medication doses may change and more medications may be added to optimize HF regimen - Patient has been educated on basic disease state pathophysiology and goals of therapy  Medication Assistance / Insurance Benefits Check: Does the patient have prescription  insurance?    Type of insurance plan:  Does the patient qualify for medication assistance through manufacturers or grants? Pending   Outpatient Pharmacy: [facility]   Is the patient willing to utilize a [facility] pharmacy at discharge?: Yes  Please do not hesitate to reach out with questions or concerns,  Enos Fling, PharmD, CPP, BCPS Heart Failure Pharmacist  Phone - [phone] 10/02/2023 1:15 PM

## 2023-10-02 NOTE — Anesthesia Preprocedure Evaluation (Signed)
 Anesthesia Evaluation  Patient identified by MRN, date of birth, ID band Patient awake    Reviewed: Allergy & Precautions, NPO status , Patient's Chart, lab work & pertinent test results  History of Anesthesia Complications Negative for: history of anesthetic complications  Airway Mallampati: II  TM Distance: >3 FB Neck ROM: Full    Dental no notable dental hx. (+) Teeth Intact   Pulmonary neg sleep apnea, neg COPD, Current Smoker and Patient abstained from smoking.   Pulmonary exam normal breath sounds clear to auscultation       Cardiovascular Exercise Tolerance: Good METShypertension, Pt. on medications +CHF  (-) CAD and (-) Past MI (-) dysrhythmias + Valvular Problems/Murmurs MR  Rhythm:Regular Rate:Bradycardia + Systolic murmurs Severe MR   Neuro/Psych  PSYCHIATRIC DISORDERS Anxiety Depression    negative neurological ROS  negative psych ROS   GI/Hepatic ,neg GERD  ,,(+)     (-) substance abuse    Endo/Other  neg diabetes    Renal/GU negative Renal ROS     Musculoskeletal   Abdominal   Peds  Hematology   Anesthesia Other Findings Past Medical History: No date: Hypertension  Reproductive/Obstetrics                             Anesthesia Physical Anesthesia Plan  ASA: 3  Anesthesia Plan: General   Post-op Pain Management: Minimal or no pain anticipated   Induction: Intravenous  PONV Risk Score and Plan: 3 and Propofol infusion, TIVA and Ondansetron  Airway Management Planned: Nasal Cannula  Additional Equipment: None  Intra-op Plan:   Post-operative Plan:   Informed Consent: I have reviewed the patients History and Physical, chart, labs and discussed the procedure including the risks, benefits and alternatives for the proposed anesthesia with the patient or authorized representative who has indicated his/her understanding and acceptance.     Dental advisory  given  Plan Discussed with: CRNA and Surgeon  Anesthesia Plan Comments: (Discussed risks of anesthesia with patient, including possibility of difficulty with spontaneous ventilation under anesthesia necessitating airway intervention, PONV, and rare risks such as cardiac or respiratory or neurological events, and allergic reactions. Discussed the role of CRNA in patient's perioperative care. Patient understands.)       Anesthesia Quick Evaluation

## 2023-10-02 NOTE — Progress Notes (Signed)
 Transesophageal Echocardiogram :  Indication: Severe mitral valve regurgitation Requesting/ordering  physician: Dr. Meriam Sprague  Procedure: Benzocaine spray x2 and 2 mls x 2 of viscous lidocaine were given orally to provide local anesthesia to the oropharynx. The patient was positioned supine on the left side, bite block provided. The patient was moderately sedated with the doses of versed and fentanyl as detailed below.  Using digital technique an omniplane probe was advanced into the distal esophagus without incident.   Moderate sedation: 1. Sedation used: Sedation per anesthesia team, propofol used  See report in EPIC  for complete details: In brief, transgastric imaging revealed normal LV function with no RWMAs and no mural apical thrombus.  .  Estimated ejection fraction was 55%.  Right sided cardiac chambers were normal with no evidence of pulmonary hypertension.  Imaging of the septum showed no ASD or VSD Bubble study was negative for shunt 2D and color flow confirmed no PFO  Severe mitral valve regurgitation, central and eccentric jets noted Rheumatic appearing leaflets predominantly anterior leaflets, multiple cusps  Moderate to severely dilated left atrium with spontaneous contrast  The LA was well visualized in orthogonal views.  There was no spontaneous contrast and no thrombus in the LA and LA appendage   The descending thoracic aorta had no  mural aortic debris with no evidence of aneurysmal dilation or disection.  Small amount aortic atherosclerosis descending aorta   Sandra Hines 10/02/2023 2:07 PM

## 2023-10-02 NOTE — Progress Notes (Signed)
 PHARMACY - ANTICOAGULATION CONSULT NOTE  Pharmacy Consult for Heparin Infusion Indication: atrial fibrillation  Allergies  Allergen Reactions   Penicillins Hives    Has patient had a PCN reaction causing immediate rash, facial/tongue/throat swelling, SOB or lightheadedness with hypotension: No Has patient had a PCN reaction causing severe rash involving mucus membranes or skin necrosis: No Has patient had a PCN reaction that required hospitalization: No Has patient had a PCN reaction occurring within the last 10 years: No If all of the above answers are "NO", then may proceed with Cephalosporin use.    Patient Measurements: Height: 5\' 7"  (170.2 cm) Weight: 89.7 kg (197 lb 12 oz) IBW/kg (Calculated) : 61.6 Heparin Dosing Weight: 82.6 kg  Vital Signs: Temp: 97.8 F (36.6 C) (03/12 0421) Temp Source: Axillary (03/12 0001) BP: 117/95 (03/12 0421) Pulse Rate: 63 (03/12 0421)  Labs: Recent Labs    09/30/23 0007 09/30/23 1151 10/01/23 0402 10/01/23 1017 10/02/23 0442  HGB 11.2*  --  11.4* 11.6*  --   HCT 34.1*  --  34.8* 36.2  --   PLT 145*  --  166 196  --   HEPARINUNFRC 0.31 0.32 0.41  --  0.40  CREATININE 0.82  --   --  0.96 0.91   Estimated Creatinine Clearance: 85 mL/min (by C-G formula based on SCr of 0.91 mg/dL).  Medical History: Past Medical History:  Diagnosis Date   Hypertension    Medications:  Scheduled:   amiodarone  400 mg Oral BID   amLODipine  10 mg Oral Daily   aspirin  81 mg Oral Pre-Cath   bisoprolol  5 mg Oral BID   buPROPion  150 mg Oral Daily   busPIRone  5 mg Oral TID   Chlorhexidine Gluconate Cloth  6 each Topical Daily   DULoxetine  40 mg Oral Daily   potassium chloride  40 mEq Oral BID   rosuvastatin  40 mg Oral Daily   Assessment: Sandra Hines is a 53 y.o. female presenting with palpitations. PMH significant for AVM s/p repair, TUD, HTN. Patient was not on [facility] PTA per chart review. Patient with newly diagnosed atrial fibrillation and  a CHA2DS2-VASc score of at least 3 (age, hypertension, and heart failure). Cardiology planning to transition to [facility] once it is clear that no invasive procedures will need to be done this admission. Pharmacy has been consulted to initiate and manage heparin infusion.   Baseline Labs: PT 15.0, INR 1.2, Hgb 12.3, Hct 37.9, Plt 215   Goal of Therapy:  Heparin level 0.3-0.7 units/ml Monitor platelets by anticoagulation protocol: Yes   Date Time HL Rate/Comment  3/6 1745 < 0.10 1300/subtherapeutic 3/7 0259 0.60 1550/therapeutic x1 3/7 0836 0.54 1550/therapeutic x2  3/8 0416 0.24 1550/subtherapeutic  3/8 1324 0.33 1750/therapeutic x1  3/8 1912 0.24 1750/subtherapeutic 3/9 0505 0.32 1900/therapeutic x 1 3/9 1115 0.24 1900/subtherapeutic 3/9 1757 0.36 2050/therapeutic x1 3/10 0007 0.31 2050/therapeutic x 2 3/11     0402    0.41    2050/therapeutic X 4 3/12     0442    0.40    2050/therapeutic X 5   Plan: heparin level therapeutic x 5 Continue heparin infusion at 2050 units/hr Recheck next heparin level in am 03/13 Continue to monitor H&H and platelets daily while on heparin infusion   Sandra Hines D 10/02/2023 6:07 AM

## 2023-10-02 NOTE — Progress Notes (Signed)
 Progress Note   Patient: Sandra Hines:[id number] DOB: 1971-04-26 DOA: 09/25/2023     7 DOS: the patient was seen and examined on 10/02/2023       Hospital course / significant events:    HPI: Sandra Hines is a 53 y.o. female with medical history significant for essential hypertension and cerebral aneurysm s/p repair, who presented to the emergency room with acute onset of palpitations 03/05 with a feeling of generalized weakness and malaise over the last couple weeks.    03/05: in ED, Afib RVR w/ rate 170s, CXR mild cardiomegaly, vascular congestion, question sequelae from granulomatous disease. In ED, received Ativan, Lopressor IV push, started Cardizem gtt but no response so started on amiodarone bolus/gtt. Cardiology consulted  03/06: requiring BiPap overnight, on HFNC this morning, starting tx for possible pneumonia/bronchitis today w/ abx steroids nebs, cardiology is adjusting medications for Afib, remains high HR but improved into the afternoon. 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 Converted to sinus.  03/07: on Ranlo O2 overnight. HR controlled, off dilt this morning, continue amio gtt into tomorrow, cardiology following and anticipate LHC and TEE next week. Holding lasix for now d/t AKI. Back on BiPap this afternoon/evening.  03/08: remaining on BiPap overnight, Cr improved so re-started IV lasix. Cardio to add ntg paste, will try to avoid ntg gtt and art line if able. Diuresing well.  03/09: improved today, off BIPap, good UOP -2L, Cr continues trend down, NSR. Plan to po amiodarone tomorrow.  03/10: po amiodarone started. Repeat limited echo. R/L Cath planned for tomorrow.      ASSESSMENT & PLAN:   Atrial fibrillation with rapid ventricular response - converted to sinus rhythm   Cardiology following - [facility] IV amiodarone drip --> po  CHA2DS2-VASc score is 3, plan for anticoagulation eventually likely w/ eliquis, on heparin  for now    Severe mitral regurgitation  Question undiagnosed rheumatic valvular heart disease or endocarditis (no obvious vegetation) maintain sinus rhythm and blood pressure control Status post TEE today Awaiting cardiac catheterization tomorrow   Acute CHF (congestive heart failure) HFpEF in setting of severe MR as above  Diuresis restarted given improvement in Cr - doing well  Cardiology following  Beta blocker w/ bisoprolol statin  Patient underwent TEE today, cardiology planning on cardiac catheterization by tomorrow   Acute bronchitis Question CAP No formal COPD dx but possible this is contributing given tobacco hx  Lung sounds much improved today ceftriaxone + azithromycin x5 days Duonebs scheduled + prn --> LAMA/LABA to start tomorrow  S/p steroids    Essential hypertension Continue amlodipine 10 mg daily Continue bisoprolol 5 mg bid  Cardiology following - defer changes in meds to their recs    Dyslipidemia Continue statin therapy   Anxiety and depression Continue Wellbutrin SR, BuSpar, Cymbalta.    Class 1 obesity based on BMI: Body mass index is 33.04 kg/m.  Counseled on weight loss when medically stable  DVT prophylaxis: heparin   Code Status: FULL CODE   TOC needs: Pending cardiac catheterization before discharge     Subjective  Patient seen and examined at bedside this morning Denies nausea vomiting abdominal pain chest pain cough   Family Communication: mother at bedside on rounds       Physical Exam Constitutional:      General: She is not in acute distress. Cardiovascular:     Rate and Rhythm: Normal rate and regular rhythm.  Pulmonary:  Effort: No respiratory distress.     Breath sounds: Rales (faint) present. No wheezing or rhonchi (mild, scattred coarse breath sounds).  Abdominal:     General: There is no distension.  Musculoskeletal:     Right lower leg: No edema.     Left lower leg: No edema.  Neurological:     General: No  focal deficit present.     Mental Status: She is alert.  Psychiatric:        Mood and Affect: Mood and affect normal.    Data Reviewed:  Vitals:   10/02/23 1330 10/02/23 1345 10/02/23 1400 10/02/23 1605  BP: 121/84 110/74 99/87 134/88  Pulse: (!) 54 62 61 64  Resp: 19     Temp:    98 F (36.7 C)  TempSrc:      SpO2: 95% 99% 100% 98%  Weight:      Height:            Latest Ref Rng & Units 10/01/2023   10:17 AM 10/01/2023    4:02 AM 09/30/2023   12:07 AM  CBC  WBC 4.0 - 10.5 K/uL 10.5  10.3  12.3   Hemoglobin 12.0 - 15.0 g/dL 78.2  95.6  21.3   Hematocrit 36.0 - 46.0 % 36.2  34.8  34.1   Platelets 150 - 400 K/uL 196  166  145        Latest Ref Rng & Units 10/02/2023    4:42 AM 10/01/2023   10:17 AM 09/30/2023   12:07 AM  BMP  Glucose 70 - 99 mg/dL 086  578  469   BUN 6 - 20 mg/dL 21  20  18    Creatinine 0.44 - 1.00 mg/dL 6.29  5.28  4.13   Sodium 135 - 145 mmol/L 139  139  136   Potassium 3.5 - 5.1 mmol/L 3.6  3.1  3.7   Chloride 98 - 111 mmol/L 103  100  98   CO2 22 - 32 mmol/L 27  29  29    Calcium 8.9 - 10.3 mg/dL 8.7  8.9  8.3      Time spent: 40 minutes  Author: Loyce Dys, MD 10/02/2023 4:40 PM  For on call review [email or web address].

## 2023-10-02 NOTE — H&P (View-Only) (Signed)
 Rounding Note    Patient Name: Sandra Hines Date of Encounter: 10/02/2023  [facility] Cardiologist: Julien Nordmann, MD   Subjective   Patient reports feeling ok today. She denies chest pain and shortness of breath. R/LHC was scheduled for today but is now pushed to tomorrow due to other emergent cases. Will tentatively plan for TEE this afternoon.   Inpatient Medications    Scheduled Meds:  amiodarone  400 mg Oral BID   amLODipine  10 mg Oral Daily   bisoprolol  5 mg Oral BID   buPROPion  150 mg Oral Daily   busPIRone  5 mg Oral TID   Chlorhexidine Gluconate Cloth  6 each Topical Daily   DULoxetine  40 mg Oral Daily   potassium chloride  40 mEq Oral Daily   rosuvastatin  40 mg Oral Daily   Continuous Infusions:  sodium chloride 10 mL/hr at 10/02/23 0613   heparin 2,050 Units/hr (10/02/23 0727)   PRN Meds: acetaminophen, cyclobenzaprine, guaiFENesin-codeine, ipratropium-albuterol, LORazepam, magnesium hydroxide, morphine injection, nitroGLYCERIN, ondansetron (ZOFRAN) IV, traZODone   Vital Signs    Vitals:   10/01/23 2300 10/02/23 0001 10/02/23 0421 10/02/23 0802  BP:  112/70 (!) 117/95 (!) 132/101  Pulse:  61 63 63  Resp:  19 17   Temp:  97.9 F (36.6 C) 97.8 F (36.6 C) 98.2 F (36.8 C)  TempSrc:  Axillary    SpO2:  98% 98% 99%  Weight: 89.7 kg     Height:        Intake/Output Summary (Last 24 hours) at 10/02/2023 1109 Last data filed at 10/02/2023 2956 Gross per 24 hour  Intake 863.94 ml  Output --  Net 863.94 ml      10/01/2023   11:00 PM 09/26/2023    1:37 AM 09/25/2023    5:03 PM  Last 3 Weights  Weight (lbs) 197 lb 12 oz 210 lb 15.7 oz 209 lb  Weight (kg) 89.7 kg 95.7 kg 94.802 kg      Telemetry    Sinus rhythm  - Personally Reviewed  Physical Exam   GEN: No acute distress.   Neck: No JVD Cardiac: RRR, 3/6 systolic murmur, rubs, or gallops.  Respiratory: Clear to auscultation bilaterally. GI: Soft, nontender, non-distended  MS:  No edema; No deformity. Neuro:  Nonfocal  Psych: Normal affect   Labs    High Sensitivity Troponin:   Recent Labs  Lab 09/25/23 1711 09/27/23 1326 09/27/23 1440  TROPONINIHS 16 16 16      Chemistry Recent Labs  Lab 09/25/23 1711 09/26/23 0426 09/30/23 0007 10/01/23 1017 10/02/23 0442  NA 136   < > 136 139 139  K 3.7   < > 3.7 3.1* 3.6  CL 104   < > 98 100 103  CO2 22   < > 29 29 27   GLUCOSE 132*   < > 157* 116* 100*  BUN 23*   < > 18 20 21*  CREATININE 1.19*   < > 0.82 0.96 0.91  CALCIUM 8.7*   < > 8.3* 8.9 8.7*  MG 2.2  --   --   --   --   PROT  --   --  6.9  --   --   ALBUMIN  --   --  3.0*  --  2.9*  AST  --   --  29  --   --   ALT  --   --  81*  --   --  ALKPHOS  --   --  116  --   --   BILITOT  --   --  0.6  --   --   GFRNONAA 56*   < > >60 >60 >60  ANIONGAP 10   < > 9 10 9    < > = values in this interval not displayed.    Lipids  Recent Labs  Lab 09/26/23 0426  CHOL 175  TRIG 79  HDL 41  LDLCALC 118*  CHOLHDL 4.3    Hematology Recent Labs  Lab 09/30/23 0007 10/01/23 0402 10/01/23 1017  WBC 12.3* 10.3 10.5  RBC 4.00 4.07 4.24  HGB 11.2* 11.4* 11.6*  HCT 34.1* 34.8* 36.2  MCV 85.3 85.5 85.4  MCH 28.0 28.0 27.4  MCHC 32.8 32.8 32.0  RDW 14.6 14.6 14.5  PLT 145* 166 196   Thyroid  Recent Labs  Lab 09/25/23 1711  TSH 1.091  FREET4 1.16*    BNP Recent Labs  Lab 09/25/23 1711  BNP 700.0*    DDimer No results for input(s): "DDIMER" in the last 168 hours.    Cardiac Studies   10/01/2023 Echo limited 1. Left ventricular ejection fraction, by estimation, is 55 to 60%. The  left ventricle has normal function. The left ventricular internal cavity  size was severely dilated. There is mild left ventricular hypertrophy.   2. Left atrial size was severely dilated.   3. 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 No  evidence of mitral stenosis.   4. The aortic valve is tricuspid. Aortic valve regurgitation is  trivial.   5. There is normal pulmonary artery systolic pressure.   6. The inferior vena cava is normal in size with greater than 50%  respiratory variability, suggesting right atrial pressure of 3 mmHg.   09/26/2023 Echo complete  1. Left ventricular ejection fraction, by estimation, is 45 to 50%. The  left ventricle has mildly decreased function. The left ventricle  demonstrates global hypokinesis. The left ventricular internal cavity size  was mildly dilated. There is mild left  ventricular hypertrophy. Left ventricular diastolic parameters are  indeterminate.   2. Right ventricular systolic function is mildly reduced. The right  ventricular size is normal. There is normal pulmonary artery systolic  pressure. The estimated right ventricular systolic pressure is 25.8 mmHg.   3. Left atrial size was severely dilated.   4. 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 No  evidence of mitral stenosis. The mean mitral valve gradient is 11.0 mmHg.  Moderate mitral annular calcification.   5. Tricuspid valve regurgitation is mild to moderate.   6. The aortic valve is tricuspid. Aortic valve regurgitation is mild. No  aortic stenosis is present.   7. There is borderline dilatation of the ascending aorta, measuring 38  mm.   8. The inferior vena cava is normal in size with greater than 50%  respiratory variability, suggesting right atrial pressure of 3 mmHg.   Patient Profile   53 y.o. female HTN, cerebral aneurysm s/p repair who is being seen for the continued evaluation of atrial fibrillation and acute systolic CHF.   Assessment & Plan    Atrial fibrillation with RVR - Presented 3/5 with new onset afib RVR, difficult to control on IV diltiazem infusion. Started on amiodarone infusion, bisoprolol, and IV digoxin with conversion to NSR 3/6 - Echo with LVEF 45-50%, dilated left atrium and severe MR - CHA2DS2VASc 3 - Telemetry shows sinus rhythm - Continue IV  heparin -  Will need to discuss options for anticoagulation prior to discharge. Patient has high deductible insurance plan and will likely need warfarin.  - Continue oral amiodarone 400 mg BID x 7 days, 200 mg BID x 7 days, 200 mg daily thereafter (transitioned yesterday from IV) and bisoprolol 5 mg BID  Acute respiratory distress Viral USI/bronchitis - Presented with chest congestion x 2 weeks - Chest CT with possible PNA - Remains on 2 L supplemental O2 - Management of steroids, nebs, and antibiotics per IM  Acute systolic CHF - Echo with LVEF 16-10%, global HK, mild LVH, mildly reduced RVSF, rheumatic MV, severe MR, mild to moderate TR, and severely dilated left atrium - IV Lasix 40 mg BID restarted 3/8 with net output -4.8 L since admission - Appears euvolemic on exam - Continue oral Lasix 40 mg daily (transitioned from IV yesterday) - Consider addition of ARB and spironolactone prior to discharge - R/LHC recommended during this admission was tentatively scheduled for today but needs to be rescheduled for tomorrow due to scheduling conflicts  Severe MR - Rheumatic appearing valve with severely dilated left atrium - Repeat limited echo 3/11 showed rheumatic valve with severe MR - Plan for TEE this afternoon for better visualization of the valve  Informed Consent   Shared Decision Making/Informed Consent   The risks [esophageal damage, perforation (1:10,000 risk), bleeding, pharyngeal hematoma as well as other potential complications associated with conscious sedation including aspiration, arrhythmia, respiratory failure and death], benefits (treatment guidance and diagnostic support) and alternatives of a transesophageal echocardiogram were discussed in detail with Ms. Roblero and she is willing to proceed.      Anxiety - PRN ativan  For questions or updates, please contact [facility] Please consult [email or web address] for contact info under     Signed, Orion Crook, PA-C   10/02/2023, 11:09 AM

## 2023-10-02 NOTE — Progress Notes (Signed)
 Rounding Note    Patient Name: Sandra Hines Date of Encounter: 10/02/2023  [facility] Cardiologist: Julien Nordmann, MD   Subjective   Patient reports feeling ok today. She denies chest pain and shortness of breath. R/LHC was scheduled for today but is now pushed to tomorrow due to other emergent cases. Will tentatively plan for TEE this afternoon.   Inpatient Medications    Scheduled Meds:  amiodarone  400 mg Oral BID   amLODipine  10 mg Oral Daily   bisoprolol  5 mg Oral BID   buPROPion  150 mg Oral Daily   busPIRone  5 mg Oral TID   Chlorhexidine Gluconate Cloth  6 each Topical Daily   DULoxetine  40 mg Oral Daily   potassium chloride  40 mEq Oral Daily   rosuvastatin  40 mg Oral Daily   Continuous Infusions:  sodium chloride 10 mL/hr at 10/02/23 0613   heparin 2,050 Units/hr (10/02/23 0727)   PRN Meds: acetaminophen, cyclobenzaprine, guaiFENesin-codeine, ipratropium-albuterol, LORazepam, magnesium hydroxide, morphine injection, nitroGLYCERIN, ondansetron (ZOFRAN) IV, traZODone   Vital Signs    Vitals:   10/01/23 2300 10/02/23 0001 10/02/23 0421 10/02/23 0802  BP:  112/70 (!) 117/95 (!) 132/101  Pulse:  61 63 63  Resp:  19 17   Temp:  97.9 F (36.6 C) 97.8 F (36.6 C) 98.2 F (36.8 C)  TempSrc:  Axillary    SpO2:  98% 98% 99%  Weight: 89.7 kg     Height:        Intake/Output Summary (Last 24 hours) at 10/02/2023 1109 Last data filed at 10/02/2023 2956 Gross per 24 hour  Intake 863.94 ml  Output --  Net 863.94 ml      10/01/2023   11:00 PM 09/26/2023    1:37 AM 09/25/2023    5:03 PM  Last 3 Weights  Weight (lbs) 197 lb 12 oz 210 lb 15.7 oz 209 lb  Weight (kg) 89.7 kg 95.7 kg 94.802 kg      Telemetry    Sinus rhythm  - Personally Reviewed  Physical Exam   GEN: No acute distress.   Neck: No JVD Cardiac: RRR, 3/6 systolic murmur, rubs, or gallops.  Respiratory: Clear to auscultation bilaterally. GI: Soft, nontender, non-distended  MS:  No edema; No deformity. Neuro:  Nonfocal  Psych: Normal affect   Labs    High Sensitivity Troponin:   Recent Labs  Lab 09/25/23 1711 09/27/23 1326 09/27/23 1440  TROPONINIHS 16 16 16      Chemistry Recent Labs  Lab 09/25/23 1711 09/26/23 0426 09/30/23 0007 10/01/23 1017 10/02/23 0442  NA 136   < > 136 139 139  K 3.7   < > 3.7 3.1* 3.6  CL 104   < > 98 100 103  CO2 22   < > 29 29 27   GLUCOSE 132*   < > 157* 116* 100*  BUN 23*   < > 18 20 21*  CREATININE 1.19*   < > 0.82 0.96 0.91  CALCIUM 8.7*   < > 8.3* 8.9 8.7*  MG 2.2  --   --   --   --   PROT  --   --  6.9  --   --   ALBUMIN  --   --  3.0*  --  2.9*  AST  --   --  29  --   --   ALT  --   --  81*  --   --  ALKPHOS  --   --  116  --   --   BILITOT  --   --  0.6  --   --   GFRNONAA 56*   < > >60 >60 >60  ANIONGAP 10   < > 9 10 9    < > = values in this interval not displayed.    Lipids  Recent Labs  Lab 09/26/23 0426  CHOL 175  TRIG 79  HDL 41  LDLCALC 118*  CHOLHDL 4.3    Hematology Recent Labs  Lab 09/30/23 0007 10/01/23 0402 10/01/23 1017  WBC 12.3* 10.3 10.5  RBC 4.00 4.07 4.24  HGB 11.2* 11.4* 11.6*  HCT 34.1* 34.8* 36.2  MCV 85.3 85.5 85.4  MCH 28.0 28.0 27.4  MCHC 32.8 32.8 32.0  RDW 14.6 14.6 14.5  PLT 145* 166 196   Thyroid  Recent Labs  Lab 09/25/23 1711  TSH 1.091  FREET4 1.16*    BNP Recent Labs  Lab 09/25/23 1711  BNP 700.0*    DDimer No results for input(s): "DDIMER" in the last 168 hours.    Cardiac Studies   10/01/2023 Echo limited 1. Left ventricular ejection fraction, by estimation, is 55 to 60%. The  left ventricle has normal function. The left ventricular internal cavity  size was severely dilated. There is mild left ventricular hypertrophy.   2. Left atrial size was severely dilated.   3. 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 No  evidence of mitral stenosis.   4. The aortic valve is tricuspid. Aortic valve regurgitation is  trivial.   5. There is normal pulmonary artery systolic pressure.   6. The inferior vena cava is normal in size with greater than 50%  respiratory variability, suggesting right atrial pressure of 3 mmHg.   09/26/2023 Echo complete  1. Left ventricular ejection fraction, by estimation, is 45 to 50%. The  left ventricle has mildly decreased function. The left ventricle  demonstrates global hypokinesis. The left ventricular internal cavity size  was mildly dilated. There is mild left  ventricular hypertrophy. Left ventricular diastolic parameters are  indeterminate.   2. Right ventricular systolic function is mildly reduced. The right  ventricular size is normal. There is normal pulmonary artery systolic  pressure. The estimated right ventricular systolic pressure is 25.8 mmHg.   3. Left atrial size was severely dilated.   4. 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 No  evidence of mitral stenosis. The mean mitral valve gradient is 11.0 mmHg.  Moderate mitral annular calcification.   5. Tricuspid valve regurgitation is mild to moderate.   6. The aortic valve is tricuspid. Aortic valve regurgitation is mild. No  aortic stenosis is present.   7. There is borderline dilatation of the ascending aorta, measuring 38  mm.   8. The inferior vena cava is normal in size with greater than 50%  respiratory variability, suggesting right atrial pressure of 3 mmHg.   Patient Profile   53 y.o. female HTN, cerebral aneurysm s/p repair who is being seen for the continued evaluation of atrial fibrillation and acute systolic CHF.   Assessment & Plan    Atrial fibrillation with RVR - Presented 3/5 with new onset afib RVR, difficult to control on IV diltiazem infusion. Started on amiodarone infusion, bisoprolol, and IV digoxin with conversion to NSR 3/6 - Echo with LVEF 45-50%, dilated left atrium and severe MR - CHA2DS2VASc 3 - Telemetry shows sinus rhythm - Continue IV  heparin -  Will need to discuss options for anticoagulation prior to discharge. Patient has high deductible insurance plan and will likely need warfarin.  - Continue oral amiodarone 400 mg BID x 7 days, 200 mg BID x 7 days, 200 mg daily thereafter (transitioned yesterday from IV) and bisoprolol 5 mg BID  Acute respiratory distress Viral USI/bronchitis - Presented with chest congestion x 2 weeks - Chest CT with possible PNA - Remains on 2 L supplemental O2 - Management of steroids, nebs, and antibiotics per IM  Acute systolic CHF - Echo with LVEF 16-10%, global HK, mild LVH, mildly reduced RVSF, rheumatic MV, severe MR, mild to moderate TR, and severely dilated left atrium - IV Lasix 40 mg BID restarted 3/8 with net output -4.8 L since admission - Appears euvolemic on exam - Continue oral Lasix 40 mg daily (transitioned from IV yesterday) - Consider addition of ARB and spironolactone prior to discharge - R/LHC recommended during this admission was tentatively scheduled for today but needs to be rescheduled for tomorrow due to scheduling conflicts  Severe MR - Rheumatic appearing valve with severely dilated left atrium - Repeat limited echo 3/11 showed rheumatic valve with severe MR - Plan for TEE this afternoon for better visualization of the valve  Informed Consent   Shared Decision Making/Informed Consent   The risks [esophageal damage, perforation (1:10,000 risk), bleeding, pharyngeal hematoma as well as other potential complications associated with conscious sedation including aspiration, arrhythmia, respiratory failure and death], benefits (treatment guidance and diagnostic support) and alternatives of a transesophageal echocardiogram were discussed in detail with Ms. Roblero and she is willing to proceed.      Anxiety - PRN ativan  For questions or updates, please contact [facility] Please consult [email or web address] for contact info under     Signed, Orion Crook, PA-C   10/02/2023, 11:09 AM

## 2023-10-02 NOTE — TOC Progression Note (Signed)
 Transition of Care [facility]) - Progression Note    Patient Details  Name: ABIGALE DOROW MRN: [id number] Date of Birth: 04-22-1971  [facility] CM/SW Contact  Truddie Hidden, RN Phone Number: 10/02/2023, 9:08 AM  Clinical Narrative:    TOC continuing to follow patient's progress throughout discharge planning.   Expected Discharge Plan: Home/Self Care Barriers to Discharge: No Barriers Identified  Expected Discharge Plan and Services   Discharge Planning Services: CM Consult   Living arrangements for the past 2 months: Single Family Home                                       Social Determinants of Health (SDOH) Interventions SDOH Screenings   Food Insecurity: No Food Insecurity (09/26/2023)  Housing: Low Risk  (09/30/2023)  Transportation Needs: No Transportation Needs (09/30/2023)  Utilities: Not At Risk (09/26/2023)  Depression (PHQ2-9): High Risk (09/25/2023)  Financial Resource Strain: Medium Risk (09/30/2023)  Tobacco Use: High Risk (09/30/2023)    Readmission Risk Interventions     No data to display

## 2023-10-02 NOTE — Progress Notes (Signed)
 OT Cancellation Note  Patient Details Name: FRANCENA ZENDER MRN: [id number] DOB: 02/24/1971   Cancelled Treatment:    Reason Eval/Treat Not Completed: Other (comment). Pt politely declines OT at this time, requests this author to return later as she just got back to bed after performing ADLs.   Geneva Barrero L. Mykal Kirchman, OTR/L  10/02/23, 11:02 AM

## 2023-10-02 NOTE — Progress Notes (Signed)
*  PRELIMINARY RESULTS* Echocardiogram Echocardiogram Transesophageal has been performed.  Sandra Hines 10/02/2023, 1:33 PM

## 2023-10-03 ENCOUNTER — Encounter
Admission: EM | Disposition: A | Discharge status: Other Acute Inpatient Hospital | Payer: Self-pay | Source: Ambulatory Visit | Attending: Osteopathic Medicine

## 2023-10-03 ENCOUNTER — Encounter (HOSPITAL_COMMUNITY): Payer: Self-pay

## 2023-10-03 ENCOUNTER — Inpatient Hospital Stay (HOSPITAL_COMMUNITY)
Admission: EM | Admit: 2023-10-03 | Discharge: 2023-10-14 | DRG: 219 | Disposition: A | Discharge status: Home / Self Care | Source: Other Acute Inpatient Hospital | Attending: Thoracic Surgery (Cardiothoracic Vascular Surgery) | Admitting: Thoracic Surgery (Cardiothoracic Vascular Surgery)

## 2023-10-03 ENCOUNTER — Encounter: Payer: Self-pay | Admitting: Internal Medicine

## 2023-10-03 DIAGNOSIS — I480 Paroxysmal atrial fibrillation: Secondary | ICD-10-CM | POA: Diagnosis not present

## 2023-10-03 DIAGNOSIS — Z8249 Family history of ischemic heart disease and other diseases of the circulatory system: Secondary | ICD-10-CM

## 2023-10-03 DIAGNOSIS — I5031 Acute diastolic (congestive) heart failure: Secondary | ICD-10-CM | POA: Diagnosis not present

## 2023-10-03 DIAGNOSIS — I447 Left bundle-branch block, unspecified: Secondary | ICD-10-CM | POA: Diagnosis not present

## 2023-10-03 DIAGNOSIS — Z8679 Personal history of other diseases of the circulatory system: Secondary | ICD-10-CM

## 2023-10-03 DIAGNOSIS — Z452 Encounter for adjustment and management of vascular access device: Secondary | ICD-10-CM | POA: Diagnosis not present

## 2023-10-03 DIAGNOSIS — J9811 Atelectasis: Secondary | ICD-10-CM | POA: Diagnosis not present

## 2023-10-03 DIAGNOSIS — E66811 Obesity, class 1: Secondary | ICD-10-CM | POA: Diagnosis present

## 2023-10-03 DIAGNOSIS — F32A Depression, unspecified: Secondary | ICD-10-CM | POA: Diagnosis present

## 2023-10-03 DIAGNOSIS — I10 Essential (primary) hypertension: Secondary | ICD-10-CM | POA: Diagnosis not present

## 2023-10-03 DIAGNOSIS — I051 Rheumatic mitral insufficiency: Principal | ICD-10-CM | POA: Diagnosis present

## 2023-10-03 DIAGNOSIS — I4891 Unspecified atrial fibrillation: Secondary | ICD-10-CM | POA: Diagnosis not present

## 2023-10-03 DIAGNOSIS — D6959 Other secondary thrombocytopenia: Secondary | ICD-10-CM | POA: Diagnosis not present

## 2023-10-03 DIAGNOSIS — R008 Other abnormalities of heart beat: Secondary | ICD-10-CM | POA: Diagnosis not present

## 2023-10-03 DIAGNOSIS — F17210 Nicotine dependence, cigarettes, uncomplicated: Secondary | ICD-10-CM | POA: Diagnosis present

## 2023-10-03 DIAGNOSIS — I455 Other specified heart block: Secondary | ICD-10-CM | POA: Diagnosis not present

## 2023-10-03 DIAGNOSIS — I5033 Acute on chronic diastolic (congestive) heart failure: Secondary | ICD-10-CM | POA: Insufficient documentation

## 2023-10-03 DIAGNOSIS — I493 Ventricular premature depolarization: Secondary | ICD-10-CM | POA: Diagnosis not present

## 2023-10-03 DIAGNOSIS — R918 Other nonspecific abnormal finding of lung field: Secondary | ICD-10-CM | POA: Diagnosis not present

## 2023-10-03 DIAGNOSIS — R7303 Prediabetes: Secondary | ICD-10-CM | POA: Diagnosis not present

## 2023-10-03 DIAGNOSIS — I110 Hypertensive heart disease with heart failure: Secondary | ICD-10-CM | POA: Diagnosis present

## 2023-10-03 DIAGNOSIS — I5043 Acute on chronic combined systolic (congestive) and diastolic (congestive) heart failure: Secondary | ICD-10-CM | POA: Diagnosis not present

## 2023-10-03 DIAGNOSIS — R5381 Other malaise: Secondary | ICD-10-CM | POA: Diagnosis not present

## 2023-10-03 DIAGNOSIS — I5021 Acute systolic (congestive) heart failure: Secondary | ICD-10-CM | POA: Diagnosis not present

## 2023-10-03 DIAGNOSIS — D62 Acute posthemorrhagic anemia: Secondary | ICD-10-CM | POA: Diagnosis not present

## 2023-10-03 DIAGNOSIS — J90 Pleural effusion, not elsewhere classified: Secondary | ICD-10-CM | POA: Diagnosis not present

## 2023-10-03 DIAGNOSIS — Z79899 Other long term (current) drug therapy: Secondary | ICD-10-CM

## 2023-10-03 DIAGNOSIS — F419 Anxiety disorder, unspecified: Secondary | ICD-10-CM | POA: Diagnosis not present

## 2023-10-03 DIAGNOSIS — I97190 Other postprocedural cardiac functional disturbances following cardiac surgery: Secondary | ICD-10-CM | POA: Diagnosis not present

## 2023-10-03 DIAGNOSIS — R001 Bradycardia, unspecified: Secondary | ICD-10-CM | POA: Diagnosis not present

## 2023-10-03 DIAGNOSIS — J209 Acute bronchitis, unspecified: Secondary | ICD-10-CM | POA: Diagnosis present

## 2023-10-03 DIAGNOSIS — Z7901 Long term (current) use of anticoagulants: Secondary | ICD-10-CM

## 2023-10-03 DIAGNOSIS — I5022 Chronic systolic (congestive) heart failure: Secondary | ICD-10-CM | POA: Diagnosis present

## 2023-10-03 DIAGNOSIS — Z6833 Body mass index (BMI) 33.0-33.9, adult: Secondary | ICD-10-CM

## 2023-10-03 DIAGNOSIS — J9690 Respiratory failure, unspecified, unspecified whether with hypoxia or hypercapnia: Secondary | ICD-10-CM | POA: Diagnosis not present

## 2023-10-03 DIAGNOSIS — Z4682 Encounter for fitting and adjustment of non-vascular catheter: Secondary | ICD-10-CM | POA: Diagnosis not present

## 2023-10-03 DIAGNOSIS — I7121 Aneurysm of the ascending aorta, without rupture: Secondary | ICD-10-CM | POA: Diagnosis present

## 2023-10-03 DIAGNOSIS — I340 Nonrheumatic mitral (valve) insufficiency: Secondary | ICD-10-CM | POA: Diagnosis not present

## 2023-10-03 DIAGNOSIS — R531 Weakness: Secondary | ICD-10-CM | POA: Diagnosis present

## 2023-10-03 DIAGNOSIS — Y838 Other surgical procedures as the cause of abnormal reaction of the patient, or of later complication, without mention of misadventure at the time of the procedure: Secondary | ICD-10-CM | POA: Diagnosis not present

## 2023-10-03 DIAGNOSIS — I50813 Acute on chronic right heart failure: Secondary | ICD-10-CM

## 2023-10-03 DIAGNOSIS — Z01810 Encounter for preprocedural cardiovascular examination: Secondary | ICD-10-CM | POA: Diagnosis not present

## 2023-10-03 DIAGNOSIS — I517 Cardiomegaly: Secondary | ICD-10-CM | POA: Diagnosis not present

## 2023-10-03 DIAGNOSIS — E871 Hypo-osmolality and hyponatremia: Secondary | ICD-10-CM | POA: Diagnosis not present

## 2023-10-03 DIAGNOSIS — Z952 Presence of prosthetic heart valve: Secondary | ICD-10-CM | POA: Diagnosis not present

## 2023-10-03 DIAGNOSIS — E785 Hyperlipidemia, unspecified: Secondary | ICD-10-CM | POA: Diagnosis not present

## 2023-10-03 DIAGNOSIS — I38 Endocarditis, valve unspecified: Secondary | ICD-10-CM | POA: Diagnosis not present

## 2023-10-03 DIAGNOSIS — E875 Hyperkalemia: Secondary | ICD-10-CM | POA: Diagnosis not present

## 2023-10-03 DIAGNOSIS — R0989 Other specified symptoms and signs involving the circulatory and respiratory systems: Secondary | ICD-10-CM | POA: Diagnosis not present

## 2023-10-03 DIAGNOSIS — I509 Heart failure, unspecified: Secondary | ICD-10-CM | POA: Diagnosis not present

## 2023-10-03 DIAGNOSIS — E876 Hypokalemia: Secondary | ICD-10-CM | POA: Diagnosis not present

## 2023-10-03 DIAGNOSIS — I495 Sick sinus syndrome: Secondary | ICD-10-CM | POA: Diagnosis not present

## 2023-10-03 DIAGNOSIS — R0689 Other abnormalities of breathing: Secondary | ICD-10-CM | POA: Diagnosis not present

## 2023-10-03 DIAGNOSIS — Z880 Allergy status to penicillin: Secondary | ICD-10-CM

## 2023-10-03 DIAGNOSIS — J9859 Other diseases of mediastinum, not elsewhere classified: Secondary | ICD-10-CM | POA: Diagnosis not present

## 2023-10-03 HISTORY — PX: RIGHT/LEFT HEART CATH AND CORONARY ANGIOGRAPHY: CATH118266

## 2023-10-03 LAB — CBC WITH DIFFERENTIAL/PLATELET
Abs Immature Granulocytes: 0.09 10*3/uL — ABNORMAL HIGH (ref 0.00–0.07)
Basophils Absolute: 0.1 10*3/uL (ref 0.0–0.1)
Basophils Relative: 1 %
Eosinophils Absolute: 0.4 10*3/uL (ref 0.0–0.5)
Eosinophils Relative: 4 %
HCT: 35.3 % — ABNORMAL LOW (ref 36.0–46.0)
Hemoglobin: 11.6 g/dL — ABNORMAL LOW (ref 12.0–15.0)
Immature Granulocytes: 1 %
Lymphocytes Relative: 30 %
Lymphs Abs: 2.9 10*3/uL (ref 0.7–4.0)
MCH: 28 pg (ref 26.0–34.0)
MCHC: 32.9 g/dL (ref 30.0–36.0)
MCV: 85.3 fL (ref 80.0–100.0)
Monocytes Absolute: 0.8 10*3/uL (ref 0.1–1.0)
Monocytes Relative: 8 %
Neutro Abs: 5.6 10*3/uL (ref 1.7–7.7)
Neutrophils Relative %: 56 %
Platelets: 206 10*3/uL (ref 150–400)
RBC: 4.14 MIL/uL (ref 3.87–5.11)
RDW: 14.6 % (ref 11.5–15.5)
WBC: 9.8 10*3/uL (ref 4.0–10.5)
nRBC: 0 % (ref 0.0–0.2)

## 2023-10-03 LAB — POCT I-STAT EG7
Acid-Base Excess: 0 mmol/L (ref 0.0–2.0)
Bicarbonate: 25 mmol/L (ref 20.0–28.0)
Calcium, Ion: 1.19 mmol/L (ref 1.15–1.40)
HCT: 35 % — ABNORMAL LOW (ref 36.0–46.0)
Hemoglobin: 11.9 g/dL — ABNORMAL LOW (ref 12.0–15.0)
O2 Saturation: 59 %
Potassium: 4.2 mmol/L (ref 3.5–5.1)
Sodium: 138 mmol/L (ref 135–145)
TCO2: 26 mmol/L (ref 22–32)
pCO2, Ven: 40.7 mmHg — ABNORMAL LOW (ref 44–60)
pH, Ven: 7.397 (ref 7.25–7.43)
pO2, Ven: 31 mmHg — CL (ref 32–45)

## 2023-10-03 LAB — RENAL FUNCTION PANEL
Albumin: 2.9 g/dL — ABNORMAL LOW (ref 3.5–5.0)
Anion gap: 10 (ref 5–15)
BUN: 19 mg/dL (ref 6–20)
CO2: 22 mmol/L (ref 22–32)
Calcium: 8.6 mg/dL — ABNORMAL LOW (ref 8.9–10.3)
Chloride: 105 mmol/L (ref 98–111)
Creatinine, Ser: 0.85 mg/dL (ref 0.44–1.00)
GFR, Estimated: >60 mL/min (ref >60)
Glucose, Bld: 151 mg/dL — ABNORMAL HIGH (ref 70–99)
Phosphorus: 3.4 mg/dL (ref 2.5–4.6)
Potassium: 4.2 mmol/L (ref 3.5–5.1)
Sodium: 137 mmol/L (ref 135–145)

## 2023-10-03 LAB — ECHO TEE
MV M vel: 5.39 m/s
MV Peak grad: 116.2 mmHg
Radius: 0.7 cm

## 2023-10-03 LAB — HEPARIN LEVEL (UNFRACTIONATED)
Heparin Unfractionated: 0.88 [IU]/mL — ABNORMAL HIGH (ref 0.30–0.70)
Heparin Unfractionated: 0.35 [IU]/mL (ref 0.30–0.70)

## 2023-10-03 LAB — POCT I-STAT 7, (LYTES, BLD GAS, ICA,H+H)
Acid-Base Excess: 1 mmol/L (ref 0.0–2.0)
Bicarbonate: 23.7 mmol/L (ref 20.0–28.0)
Calcium, Ion: 1.2 mmol/L (ref 1.15–1.40)
HCT: 36 % (ref 36.0–46.0)
Hemoglobin: 12.2 g/dL (ref 12.0–15.0)
O2 Saturation: 94 %
Potassium: 4.3 mmol/L (ref 3.5–5.1)
Sodium: 141 mmol/L (ref 135–145)
TCO2: 25 mmol/L (ref 22–32)
pCO2 arterial: 31.5 mmHg — ABNORMAL LOW (ref 32–48)
pH, Arterial: 7.485 — ABNORMAL HIGH (ref 7.35–7.45)
pO2, Arterial: 65 mmHg — ABNORMAL LOW (ref 83–108)

## 2023-10-03 SURGERY — RIGHT/LEFT HEART CATH AND CORONARY ANGIOGRAPHY
Anesthesia: Moderate Sedation

## 2023-10-03 MED ORDER — SODIUM CHLORIDE 0.9 % IV SOLN: 250.0000 mL | INTRAVENOUS | Status: DC | PRN

## 2023-10-03 MED ORDER — SACUBITRIL-VALSARTAN 24-26 MG PO TABS
1.0000 | ORAL_TABLET | Freq: Two times a day (BID) | ORAL | Status: DC
Start: 2023-10-03 — End: 2023-10-05
  Administered 2023-10-03 – 2023-10-05 (×4): 1 via ORAL
  Filled 2023-10-03 (×4): qty 1

## 2023-10-03 MED ORDER — MIDAZOLAM HCL 2 MG/2ML IJ SOLN
INTRAMUSCULAR | Status: DC | PRN
  Administered 2023-10-03: 1 mg via INTRAVENOUS

## 2023-10-03 MED ORDER — HEPARIN SODIUM (PORCINE) 1000 UNIT/ML IJ SOLN
INTRAMUSCULAR | Status: DC | PRN
  Administered 2023-10-03: 4500 [IU] via INTRAVENOUS

## 2023-10-03 MED ORDER — SODIUM CHLORIDE 0.9 % IV SOLN: 250.0000 mL | INTRAVENOUS | Status: AC | PRN

## 2023-10-03 MED ORDER — FUROSEMIDE 10 MG/ML IJ SOLN
40.0000 mg | Freq: Once | INTRAMUSCULAR | Status: AC
  Administered 2023-10-03: 40 mg via INTRAVENOUS
  Filled 2023-10-03: qty 4

## 2023-10-03 MED ORDER — ACETAMINOPHEN 325 MG PO TABS
650.0000 mg | ORAL_TABLET | ORAL | Status: DC | PRN
  Administered 2023-10-04 – 2023-10-06 (×4): 650 mg via ORAL
  Filled 2023-10-03 (×5): qty 2

## 2023-10-03 MED ORDER — FUROSEMIDE 10 MG/ML IJ SOLN
80.0000 mg | Freq: Two times a day (BID) | INTRAMUSCULAR | Status: DC
  Administered 2023-10-04: 80 mg via INTRAVENOUS
  Filled 2023-10-03: qty 8

## 2023-10-03 MED ORDER — BUSPIRONE HCL 10 MG PO TABS
5.0000 mg | ORAL_TABLET | Freq: Three times a day (TID) | ORAL | Status: DC
  Administered 2023-10-03 – 2023-10-06 (×10): 5 mg via ORAL
  Filled 2023-10-03 (×10): qty 1

## 2023-10-03 MED ORDER — TRAZODONE HCL 50 MG PO TABS
25.0000 mg | ORAL_TABLET | Freq: Every evening | ORAL | Status: DC | PRN
  Administered 2023-10-03 – 2023-10-06 (×3): 50 mg via ORAL
  Filled 2023-10-03 (×3): qty 1

## 2023-10-03 MED ORDER — VERAPAMIL HCL 2.5 MG/ML IV SOLN
INTRAVENOUS | Status: AC
  Filled 2023-10-03: qty 2

## 2023-10-03 MED ORDER — FUROSEMIDE 10 MG/ML IJ SOLN: 80.0000 mg | Freq: Two times a day (BID) | INTRAMUSCULAR | Status: DC

## 2023-10-03 MED ORDER — FENTANYL CITRATE PF 50 MCG/ML IJ SOSY
PREFILLED_SYRINGE | INTRAMUSCULAR | Status: AC
  Filled 2023-10-03: qty 1

## 2023-10-03 MED ORDER — MIDAZOLAM HCL 2 MG/2ML IJ SOLN
INTRAMUSCULAR | Status: AC
Start: 2023-10-03 — End: ?
  Filled 2023-10-03: qty 2

## 2023-10-03 MED ORDER — SODIUM CHLORIDE 0.9% FLUSH: 3.0000 mL | INTRAVENOUS | Status: DC | PRN

## 2023-10-03 MED ORDER — POTASSIUM CHLORIDE CRYS ER 20 MEQ PO TBCR
40.0000 meq | EXTENDED_RELEASE_TABLET | Freq: Every day | ORAL | Status: DC
Start: 2023-10-03 — End: 2023-10-14

## 2023-10-03 MED ORDER — HEPARIN (PORCINE) 25000 UT/250ML-% IV SOLN
1900.0000 [IU]/h | INTRAVENOUS | Status: DC
  Administered 2023-10-03: 1900 [IU]/h via INTRAVENOUS
  Filled 2023-10-03: qty 250

## 2023-10-03 MED ORDER — MORPHINE SULFATE (PF) 2 MG/ML IV SOLN: 2.0000 mg | INTRAVENOUS | Status: DC | PRN

## 2023-10-03 MED ORDER — ONDANSETRON HCL 4 MG/2ML IJ SOLN: 4.0000 mg | Freq: Four times a day (QID) | INTRAMUSCULAR | Status: DC | PRN

## 2023-10-03 MED ORDER — LIDOCAINE HCL (PF) 1 % IJ SOLN
INTRAMUSCULAR | Status: DC | PRN
  Administered 2023-10-03 (×2): 2 mL

## 2023-10-03 MED ORDER — MAGNESIUM HYDROXIDE 400 MG/5ML PO SUSP
30.0000 mL | Freq: Every day | ORAL | Status: DC | PRN
Start: 2023-10-03 — End: 2023-10-07

## 2023-10-03 MED ORDER — IOHEXOL 300 MG/ML  SOLN
INTRAMUSCULAR | Status: DC | PRN
Start: 2023-10-03 — End: 2023-10-03
  Administered 2023-10-03: 31 mL

## 2023-10-03 MED ORDER — HEPARIN (PORCINE) 25000 UT/250ML-% IV SOLN
1400.0000 [IU]/h | INTRAVENOUS | Status: DC
  Administered 2023-10-04 – 2023-10-05 (×4): 1900 [IU]/h via INTRAVENOUS
  Administered 2023-10-06: 1700 [IU]/h via INTRAVENOUS
  Administered 2023-10-07: 1400 [IU]/h via INTRAVENOUS
  Filled 2023-10-03 (×6): qty 250

## 2023-10-03 MED ORDER — ACETAMINOPHEN-CAFFEINE 500-65 MG PO TABS: 2.0000 | ORAL_TABLET | Freq: Three times a day (TID) | ORAL | Status: DC | PRN

## 2023-10-03 MED ORDER — HYDRALAZINE HCL 20 MG/ML IJ SOLN: 10.0000 mg | INTRAMUSCULAR | Status: AC | PRN

## 2023-10-03 MED ORDER — CYCLOBENZAPRINE HCL 10 MG PO TABS
5.0000 mg | ORAL_TABLET | Freq: Three times a day (TID) | ORAL | Status: DC | PRN
Start: 2023-10-03 — End: 2023-10-11
  Administered 2023-10-04 – 2023-10-06 (×2): 5 mg via ORAL
  Filled 2023-10-03 (×2): qty 1

## 2023-10-03 MED ORDER — VERAPAMIL HCL 2.5 MG/ML IV SOLN
INTRAVENOUS | Status: DC | PRN
  Administered 2023-10-03: 2.5 mg via INTRA_ARTERIAL

## 2023-10-03 MED ORDER — IPRATROPIUM-ALBUTEROL 0.5-2.5 (3) MG/3ML IN SOLN: 3.0000 mL | RESPIRATORY_TRACT | Status: DC | PRN

## 2023-10-03 MED ORDER — CYCLOBENZAPRINE HCL 5 MG PO TABS: 5.0000 mg | ORAL_TABLET | Freq: Three times a day (TID) | ORAL | Status: DC | PRN

## 2023-10-03 MED ORDER — SACUBITRIL-VALSARTAN 24-26 MG PO TABS
1.0000 | ORAL_TABLET | Freq: Two times a day (BID) | ORAL | Status: DC
  Administered 2023-10-03: 1 via ORAL
  Filled 2023-10-03: qty 1

## 2023-10-03 MED ORDER — SODIUM CHLORIDE 0.9 % IV SOLN: INTRAVENOUS | Status: DC

## 2023-10-03 MED ORDER — LABETALOL HCL 5 MG/ML IV SOLN: 10.0000 mg | INTRAVENOUS | Status: DC | PRN

## 2023-10-03 MED ORDER — SPIRONOLACTONE 12.5 MG HALF TABLET
12.5000 mg | ORAL_TABLET | Freq: Every day | ORAL | Status: DC
  Administered 2023-10-03: 12.5 mg via ORAL
  Filled 2023-10-03: qty 1

## 2023-10-03 MED ORDER — ROSUVASTATIN CALCIUM 20 MG PO TABS
40.0000 mg | ORAL_TABLET | Freq: Every day | ORAL | Status: DC
  Administered 2023-10-04 – 2023-10-06 (×3): 40 mg via ORAL
  Filled 2023-10-03 (×3): qty 2

## 2023-10-03 MED ORDER — ACETAMINOPHEN-CAFFEINE 500-65 MG PO TABS
2.0000 | ORAL_TABLET | Freq: Three times a day (TID) | ORAL | Status: DC | PRN
  Administered 2023-10-04 – 2023-10-06 (×4): 2 via ORAL
  Filled 2023-10-03 (×6): qty 2

## 2023-10-03 MED ORDER — LORAZEPAM 1 MG PO TABS
1.0000 mg | ORAL_TABLET | Freq: Four times a day (QID) | ORAL | Status: DC | PRN
  Administered 2023-10-03 – 2023-10-06 (×4): 1 mg via ORAL
  Filled 2023-10-03 (×4): qty 1

## 2023-10-03 MED ORDER — FUROSEMIDE 10 MG/ML IJ SOLN
40.0000 mg | Freq: Two times a day (BID) | INTRAMUSCULAR | Status: DC
  Administered 2023-10-03: 40 mg via INTRAVENOUS

## 2023-10-03 MED ORDER — MAGNESIUM HYDROXIDE 400 MG/5ML PO SUSP: 30.0000 mL | Freq: Every day | ORAL | Status: DC | PRN

## 2023-10-03 MED ORDER — FUROSEMIDE 10 MG/ML IJ SOLN
INTRAMUSCULAR | Status: AC
  Filled 2023-10-03: qty 4

## 2023-10-03 MED ORDER — ACETAMINOPHEN 325 MG PO TABS: 650.0000 mg | ORAL_TABLET | ORAL | Status: AC | PRN

## 2023-10-03 MED ORDER — LABETALOL HCL 5 MG/ML IV SOLN: 10.0000 mg | INTRAVENOUS | Status: AC | PRN

## 2023-10-03 MED ORDER — AMIODARONE HCL 200 MG PO TABS
400.0000 mg | ORAL_TABLET | Freq: Two times a day (BID) | ORAL | Status: DC
  Administered 2023-10-03 – 2023-10-07 (×8): 400 mg via ORAL
  Filled 2023-10-03 (×8): qty 2

## 2023-10-03 MED ORDER — POTASSIUM CHLORIDE CRYS ER 20 MEQ PO TBCR
40.0000 meq | EXTENDED_RELEASE_TABLET | Freq: Every day | ORAL | Status: DC
  Administered 2023-10-04 – 2023-10-06 (×3): 40 meq via ORAL
  Filled 2023-10-03 (×3): qty 2

## 2023-10-03 MED ORDER — HEPARIN (PORCINE) IN NACL 1000-0.9 UT/500ML-% IV SOLN
INTRAVENOUS | Status: DC | PRN
  Administered 2023-10-03: 1000 mL

## 2023-10-03 MED ORDER — SODIUM CHLORIDE 0.9% FLUSH
3.0000 mL | Freq: Two times a day (BID) | INTRAVENOUS | Status: DC
  Administered 2023-10-03: 3 mL via INTRAVENOUS

## 2023-10-03 MED ORDER — LORAZEPAM 1 MG PO TABS: 1.0000 mg | ORAL_TABLET | Freq: Four times a day (QID) | ORAL | Status: DC | PRN

## 2023-10-03 MED ORDER — FENTANYL CITRATE (PF) 100 MCG/2ML IJ SOLN
INTRAMUSCULAR | Status: DC | PRN
  Administered 2023-10-03: 25 ug via INTRAVENOUS

## 2023-10-03 MED ORDER — AMIODARONE HCL 400 MG PO TABS
400.0000 mg | ORAL_TABLET | Freq: Two times a day (BID) | ORAL | Status: DC
Start: 2023-10-03 — End: 2023-10-14

## 2023-10-03 MED ORDER — SACUBITRIL-VALSARTAN 24-26 MG PO TABS: 1.0000 | ORAL_TABLET | Freq: Two times a day (BID) | ORAL | Status: DC

## 2023-10-03 MED ORDER — DULOXETINE HCL 20 MG PO CPEP
40.0000 mg | ORAL_CAPSULE | Freq: Every day | ORAL | Status: DC
  Administered 2023-10-04 – 2023-10-06 (×3): 40 mg via ORAL
  Filled 2023-10-03 (×4): qty 2

## 2023-10-03 MED ORDER — HEPARIN (PORCINE) 25000 UT/250ML-% IV SOLN: 1900.0000 [IU]/h | INTRAVENOUS | Status: DC

## 2023-10-03 MED ORDER — SPIRONOLACTONE 12.5 MG HALF TABLET
12.5000 mg | ORAL_TABLET | Freq: Every day | ORAL | Status: DC
  Administered 2023-10-04 – 2023-10-06 (×3): 12.5 mg via ORAL
  Filled 2023-10-03 (×3): qty 1

## 2023-10-03 MED ORDER — HYDRALAZINE HCL 20 MG/ML IJ SOLN: 10.0000 mg | INTRAMUSCULAR | Status: DC | PRN

## 2023-10-03 MED ORDER — SODIUM CHLORIDE 0.9% FLUSH
3.0000 mL | Freq: Two times a day (BID) | INTRAVENOUS | Status: DC
  Administered 2023-10-03 – 2023-10-06 (×3): 3 mL via INTRAVENOUS

## 2023-10-03 MED ORDER — HEPARIN SODIUM (PORCINE) 1000 UNIT/ML IJ SOLN
INTRAMUSCULAR | Status: AC
  Filled 2023-10-03: qty 10

## 2023-10-03 MED ORDER — SPIRONOLACTONE 25 MG PO TABS
12.5000 mg | ORAL_TABLET | Freq: Every day | ORAL | Status: DC
Start: 2023-10-03 — End: 2023-10-14

## 2023-10-03 SURGICAL SUPPLY — 13 items
CATH 5FR JL3.5 JR4 ANG PIG MP (CATHETERS) IMPLANT
CATH BALLN WEDGE 5F 110CM (CATHETERS) IMPLANT
DEVICE RAD TR BAND REGULAR (VASCULAR PRODUCTS) IMPLANT
DRAPE BRACHIAL (DRAPES) IMPLANT
GLIDESHEATH SLEND SS 6F .021 (SHEATH) IMPLANT
GUIDEWIRE INQWIRE 1.5J.035X260 (WIRE) IMPLANT
INQWIRE 1.5J .035X260CM (WIRE) ×1 IMPLANT
KIT SYRINGE INJ CVI SPIKEX1 (MISCELLANEOUS) IMPLANT
PACK CARDIAC CATH (CUSTOM PROCEDURE TRAY) ×2 IMPLANT
PROTECTION STATION PRESSURIZED (MISCELLANEOUS) ×1 IMPLANT
SET ATX-X65L (MISCELLANEOUS) IMPLANT
SHEATH GLIDE SLENDER 4/5FR (SHEATH) IMPLANT
STATION PROTECTION PRESSURIZED (MISCELLANEOUS) IMPLANT

## 2023-10-03 NOTE — Progress Notes (Signed)
 PHARMACY - ANTICOAGULATION CONSULT NOTE  Pharmacy Consult for Heparin Infusion Indication: atrial fibrillation  Allergies  Allergen Reactions   Penicillins Hives    Has patient had a PCN reaction causing immediate rash, facial/tongue/throat swelling, SOB or lightheadedness with hypotension: No Has patient had a PCN reaction causing severe rash involving mucus membranes or skin necrosis: No Has patient had a PCN reaction that required hospitalization: No Has patient had a PCN reaction occurring within the last 10 years: No If all of the above answers are "NO", then may proceed with Cephalosporin use.    Patient Measurements: Height: 5\' 7"  (170.2 cm) Weight: 89.7 kg (197 lb 12 oz) IBW/kg (Calculated) : 61.6 Heparin Dosing Weight: 82.6 kg  Vital Signs: Temp: 98.2 F (36.8 C) (03/13 0446) BP: 110/76 (03/13 0446) Pulse Rate: 58 (03/13 0446)  Labs: Recent Labs    10/01/23 0402 10/01/23 1017 10/02/23 0442 10/03/23 0447  HGB 11.4* 11.6*  --  11.6*  HCT 34.8* 36.2  --  35.3*  PLT 166 196  --  206  HEPARINUNFRC 0.41  --  0.40 0.88*  CREATININE  --  0.96 0.91 0.85   Estimated Creatinine Clearance: 91 mL/min (by C-G formula based on SCr of 0.85 mg/dL).  Medical History: Past Medical History:  Diagnosis Date   Hypertension    Medications:  Scheduled:   amiodarone  400 mg Oral BID   amLODipine  10 mg Oral Daily   bisoprolol  5 mg Oral BID   buPROPion  150 mg Oral Daily   busPIRone  5 mg Oral TID   Chlorhexidine Gluconate Cloth  6 each Topical Daily   DULoxetine  40 mg Oral Daily   potassium chloride  40 mEq Oral Daily   rosuvastatin  40 mg Oral Daily   Assessment: Sandra Hines is a 53 y.o. female presenting with palpitations. PMH significant for AVM s/p repair, TUD, HTN. Patient was not on [facility] PTA per chart review. Patient with newly diagnosed atrial fibrillation and a CHA2DS2-VASc score of at least 3 (age, hypertension, and heart failure). Cardiology planning to  transition to [facility] once it is clear that no invasive procedures will need to be done this admission. Pharmacy has been consulted to initiate and manage heparin infusion.   Baseline Labs: PT 15.0, INR 1.2, Hgb 12.3, Hct 37.9, Plt 215   Goal of Therapy:  Heparin level 0.3-0.7 units/ml Monitor platelets by anticoagulation protocol: Yes   Date Time HL Rate/Comment  3/6 1745 < 0.10 1300/subtherapeutic 3/7 0259 0.60 1550/therapeutic x1 3/7 0836 0.54 1550/therapeutic x2  3/8 0416 0.24 1550/subtherapeutic  3/8 1324 0.33 1750/therapeutic x1  3/8 1912 0.24 1750/subtherapeutic 3/9 0505 0.32 1900/therapeutic x 1 3/9 1115 0.24 1900/subtherapeutic 3/9 1757 0.36 2050/therapeutic x1 3/10 0007 0.31 2050/therapeutic x 2 3/11     0402    0.41    2050/therapeutic X 4 3/12     0442    0.40    2050/therapeutic X 5  3/13     0447    0.88    2050/elevated   Plan:  3/13:  HL @ 0447 = 0.88,  elevated - will decrease heparin drip rate to 1900 units/hr and recheck HL 6 hrs after rate change Continue to monitor H&H and platelets daily while on heparin infusion   Sandra Hines D 10/03/2023 6:10 AM

## 2023-10-03 NOTE — Interval H&P Note (Signed)
 History and Physical Interval Note:  10/03/2023 7:30 AM  Sandra Hines  has presented today for surgery, with the diagnosis of severe mitral valve regurgitation.  The various methods of treatment have been discussed with the patient and family. After consideration of risks, benefits and other options for treatment, the patient has consented to  Procedure(s): RIGHT/LEFT HEART CATH AND CORONARY ANGIOGRAPHY (N/A) as a surgical intervention.  The patient's history has been reviewed, patient examined, no change in status, stable for surgery.  I have reviewed the patient's chart and labs.  Questions were answered to the patient's satisfaction.    Cath Lab Visit (complete for each Cath Lab visit)  Clinical Evaluation Leading to the Procedure:   ACS: No.  Non-ACS:    Anginal/Heart Failure Classification: NYHA class IV  Anti-ischemic medical therapy: Maximal Therapy (2 or more classes of medications)  Non-Invasive Test Results: No non-invasive testing performed  Prior CABG: No previous CABG  [name]

## 2023-10-03 NOTE — Discharge Summary (Signed)
 Physician Discharge Summary   Patient: Sandra Hines MRN: [id number] DOB: February 09, 1971  Admit date:     09/25/2023  Discharge date: 10/03/23  Discharge Physician: Loyce Dys   PCP: Ronnald Ramp, MD   Recommendations at discharge:  Being transferred to [facility] for further management  Discharge Diagnoses:  Atrial fibrillation with rapid ventricular response - converted to sinus rhythm   Severe mitral regurgitation  Acute CHF (congestive heart failure) HFpEF in setting of severe MR Acute bronchitis Question CAP No formal COPD dx but possible this is contributing given tobacco hx Essential hypertension Dyslipidemia Anxiety and depression Class 1 obesity based on BMI:   Hospital Course:  Sandra Hines is a 53 y.o. female with medical history significant for essential hypertension and cerebral aneurysm s/p repair, who presented to the emergency room with acute onset of palpitations 03/05 with a feeling of generalized weakness and malaise over the last couple weeks.    03/05: in ED, Afib RVR w/ rate 170s, CXR mild cardiomegaly, vascular congestion, question sequelae from granulomatous disease. In ED, received Ativan, Lopressor IV push, started Cardizem gtt but no response so started on amiodarone bolus/gtt. Cardiology consulted  03/06: requiring BiPap overnight, on HFNC this morning, starting tx for possible pneumonia/bronchitis today w/ abx steroids nebs, cardiology is adjusting medications for Afib, remains high HR but improved into the afternoon. 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 Converted to sinus.  03/07: on [place] O2 overnight. HR controlled, off dilt this morning, continue amio gtt into tomorrow, cardiology following and anticipate LHC and TEE next week. Holding lasix for now d/t AKI. Back on BiPap this afternoon/evening.  03/08: remaining on BiPap overnight, Cr improved so re-started IV lasix. Cardio to add ntg  paste, will try to avoid ntg gtt and art line if able. Diuresing well.  03/09: improved today, off BIPap, good UOP -2L, Cr continues trend down, NSR. Plan to po amiodarone tomorrow.  03/10: po amiodarone started. Repeat limited echo. R/L Cath planned for tomorrow.  3/12: Patient underwent TEE that did not show any evidence of vegetation, no more apical thrombus, ejection fraction estimated to be 55%, no evidence of shunt on bubble study.  There was findings of severe mitral valve regurgitation with rheumatic appearing leaflets predominantly anterior leaflets. 3/13: Underwent right and left heart cath today: No significant CAD on cath. Still quite volume up by [facility]. RA 10, RV 63/10, PA 63/28, PW 20 with large v-waves. LVEDP 25.  Advance heart failure team was consulted and patient was seen by Dr. Gala Romney who recommended transfer to [facility] for optimization and possible need for MR surgery hopefully next week.  Assessment and Plan:  Atrial fibrillation with rapid ventricular response - converted to sinus rhythm   Cardiology following - [facility] Underwent right and left heart cath today: No significant CAD on cath. Still quite volume up by RHC. RA 10, RV 63/10, PA 63/28, PW 20 with large v-waves. LVEDP 25.  Advance heart failure team was consulted and patient was seen by Dr. Gala Romney who recommended transfer to [facility] for optimization and possible need for MR surgery hopefully next week. Continue p.o. amiodarone as recommended by cardiologist CHA2DS2-VASc score is 3, currently patient on heparin drip   Severe mitral regurgitation  Question undiagnosed rheumatic valvular heart disease  Status post TEE, did not show any vegetation other findings as noted above Status post cardiac catheterization today 3/13 that showed no significant coronary artery  disease but rather severe MR as well as volume overload.    Acute CHF (congestive heart failure) HFpEF in setting of severe MR as above  Diuresis  restarted given improvement in Cr - doing well  Cardiology following  Patient initiated on spironolactone, Entresto by advanced heart failure team Status post left and right heart catheterization   Acute bronchitis Question CAP No formal COPD dx but possible this is contributing given tobacco hx  Lung sounds much improved today Has completed 5 days course of ceftriaxone and azithromycin Duonebs scheduled + prn Completed 5 days course of steroid therapy   Essential hypertension Bisoprolol and amlodipine discontinued as recommended by cardiology Continue other GDMT   Dyslipidemia Continue statin therapy   Anxiety and depression Continue Wellbutrin SR, BuSpar, Cymbalta.     Class 1 obesity based on BMI: Body mass index is 33.04 kg/m.  Counseled on weight loss when medically stable   DVT prophylaxis: heparin   Code Status: FULL CODE   Disposition: Being transferred to [facility] for advanced heart failure consultation as well as cardiothoracic surgeon evaluation for possible severe MR surgery.     Consultants: Cardiology Procedures performed: TEE and left and right heart catheterization Disposition: Being transferred to [facility] Diet recommendation:  Cardiac diet DISCHARGE MEDICATION: Allergies as of 10/03/2023       Reactions   Penicillins Hives   Has patient had a PCN reaction causing immediate rash, facial/tongue/throat swelling, SOB or lightheadedness with hypotension: No Has patient had a PCN reaction causing severe rash involving mucus membranes or skin necrosis: No Has patient had a PCN reaction that required hospitalization: No Has patient had a PCN reaction occurring within the last 10 years: No If all of the above answers are "NO", then may proceed with Cephalosporin use.        Medication List     STOP taking these medications    amLODipine 10 MG tablet Commonly known as: NORVASC       TAKE these medications    acetaminophen 325 MG  tablet Commonly known as: TYLENOL Take 2 tablets (650 mg total) by mouth every 4 (four) hours as needed for headache or mild pain (pain score 1-3).   acetaminophen-caffeine 500-65 MG Tabs per tablet Commonly known as: EXCEDRIN TENSION HEADACHE Take 2 tablets by mouth every 8 (eight) hours as needed (headaches).   amiodarone 400 MG tablet Commonly known as: PACERONE Take 1 tablet (400 mg total) by mouth 2 (two) times daily.   buPROPion 150 MG 12 hr tablet Commonly known as: Wellbutrin SR 1 tablet daily for 3 days, then 1 tablet twice daily. Stop smoking 14 days after starting medication   busPIRone 5 MG tablet Commonly known as: BUSPAR Take 1 tablet (5 mg total) by mouth 3 (three) times daily.   cyclobenzaprine 5 MG tablet Commonly known as: FLEXERIL Take 1 tablet (5 mg total) by mouth 3 (three) times daily as needed for muscle spasms.   DULoxetine 20 MG capsule Commonly known as: Cymbalta Take 2 capsules (40 mg total) by mouth daily.   furosemide 10 MG/ML injection Commonly known as: LASIX Inject 8 mLs (80 mg total) into the vein 2 (two) times daily.   heparin 16109 UT/250ML infusion Inject 1,900 Units/hr into the vein continuous.   hydrALAZINE 20 MG/ML injection Commonly known as: APRESOLINE Inject 0.5 mLs (10 mg total) into the vein every 20 (twenty) minutes as needed (high blood pressure).   ipratropium-albuterol 0.5-2.5 (3) MG/3ML Soln Commonly known as: DUONEB  Take 3 mLs by nebulization every 2 (two) hours as needed.   labetalol 5 MG/ML injection Commonly known as: NORMODYNE Inject 2 mLs (10 mg total) into the vein every 10 (ten) minutes as needed.   LORazepam 1 MG tablet Commonly known as: ATIVAN Take 1 tablet (1 mg total) by mouth every 6 (six) hours as needed for anxiety.   magnesium hydroxide 400 MG/5ML suspension Commonly known as: MILK OF MAGNESIA Take 30 mLs by mouth daily as needed for mild constipation or moderate constipation.   morphine (PF) 2  MG/ML injection Inject 1 mL (2 mg total) into the vein every 2 (two) hours as needed.   ondansetron 4 MG/2ML Soln injection Commonly known as: ZOFRAN Inject 2 mLs (4 mg total) into the vein every 6 (six) hours as needed for nausea.   potassium chloride SA 20 MEQ tablet Commonly known as: KLOR-CON M Take 2 tablets (40 mEq total) by mouth daily.   rosuvastatin 40 MG tablet Commonly known as: CRESTOR Take 1 tablet (40 mg total) by mouth daily.   sacubitril-valsartan 24-26 MG Commonly known as: ENTRESTO Take 1 tablet by mouth 2 (two) times daily.   spironolactone 25 MG tablet Commonly known as: ALDACTONE Take 0.5 tablets (12.5 mg total) by mouth daily.   traZODone 50 MG tablet Commonly known as: DESYREL Take 0.5-1 tablets (25-50 mg total) by mouth at bedtime as needed for sleep.        Discharge Exam: Filed Weights   09/25/23 1703 09/26/23 0137 10/01/23 2300  Weight: 94.8 kg 95.7 kg 89.7 kg   Physical Exam Constitutional:      General: She is not in acute distress. Cardiovascular:     Rate and Rhythm: Normal rate and regular rhythm.  Pulmonary:     Effort: No respiratory distress.     Breath sounds: Rales (faint) present. No wheezing or rhonchi (mild, scattred coarse breath sounds).  Abdominal:     General: There is no distension.  Musculoskeletal:     Right lower leg: No edema.     Left lower leg: No edema.  Neurological:     General: No focal deficit present.     Mental Status: She is alert.  Psychiatric:        Mood and Affect: Mood and affect normal.   Condition at discharge: fair  The results of significant diagnostics from this hospitalization (including imaging, microbiology, ancillary and laboratory) are listed below for reference.   Imaging Studies: CARDIAC CATHETERIZATION Result Date: 10/03/2023 Conclusions: No angiographically significant atherosclerotic coronary artery disease.  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 Moderately elevated  left heart, right heart, and pulmonary artery pressures.  Large v-waves observed on PCWP tracing consistent with the patient's severe mitral regurgitation. Low normal to mildly reduced Fick cardiac output/index. Recommendations: Escalate diuresis and consider consultation with advanced heart failure team for optimization of acute diastolic heart failure in the setting of severe mitral valve regurgitation. Cardiac surgery consultation for mitral valve replacement.  Response to medical therapy will dictate if this can be done as an outpatient or if the patient will need transfer to [facility] for inpatient evaluation. Resume heparin infusion 2 hours after TR band removal. Primary prevention of coronary artery disease. Yvonne Kendall, MD [facility]  ECHO TEE Result Date: 10/03/2023    TRANSESOPHOGEAL ECHO REPORT   Patient Name:   Sandra Hines Date of Exam: 10/02/2023 Medical Rec #:  [id number]    Height:  67.0 in Accession #:    [id number]   Weight:       197.8 lb Date of Birth:  [birth year]   BSA:          2.013 m Patient Age:    52 years     BP:           121/88 mmHg Patient Gender: F            HR:           59 bpm. Exam Location:  [facility] Procedure: 3D Echo, Transesophageal Echo, Cardiac Doppler, Color Doppler and            Saline Contrast Bubble Study (Both Spectral and Color Flow Doppler            were utilized during procedure). Indications:     Severe Mitral valve regurgitation  History:         Patient has prior history of Echocardiogram examinations, most                  recent 10/01/2023. Risk Factors:Hypertension.  Sonographer:     Cristela Blue Referring Phys:  [id number] [facility] CAREY Diagnosing Phys: Julien Nordmann MD PROCEDURE: After discussion of the risks and benefits of a TEE, an informed consent was obtained from the patient. TEE procedure time was 30 minutes. The transesophogeal probe was passed without difficulty through the esophogus of the patient. Imaged were obtained with the patient in a  left lateral decubitus position. Local oropharyngeal anesthetic was provided with Cetacaine and viscous lidocaine. Sedation performed by different physician. The patient was monitored while under deep sedation. Image quality was excellent. The patient's vital signs; including heart rate, blood pressure, and oxygen saturation; remained stable throughout the procedure. The patient developed no complications during the procedure.  IMPRESSIONS  1. Left ventricular ejection fraction, by estimation, is 55 to 60%. The left ventricle has normal function. The left ventricle has no regional wall motion abnormalities.  2. Right ventricular systolic function is low normal. The right ventricular size is normal.  3. The mitral valve is rheumatic, both anterior and posterior leaflets involved. Severe mitral valve regurgitation, central and eccentric jet noted. No evidence of mitral stenosis.  4. The aortic valve is normal in structure. Aortic valve regurgitation is not visualized. No aortic stenosis is present.  5. The inferior vena cava is normal in size with greater than 50% respiratory variability, suggesting right atrial pressure of 3 mmHg.  6. No left atrial/left atrial appendage thrombus was detected.  7. 3D performed of the mitral valve and demonstrates rheumatic mitral valve. Conclusion(s)/Recommendation(s): hemodynamically significant mitral valvular heart disease as detailed above. FINDINGS  Left Ventricle: Left ventricular ejection fraction, by estimation, is 55 to 60%. The left ventricle has normal function. The left ventricle has no regional wall motion abnormalities. The left ventricular internal cavity size was normal in size. There is  no left ventricular hypertrophy. Right Ventricle: The right ventricular size is normal. No increase in right ventricular wall thickness. Right ventricular systolic function is low normal. Left Atrium: Left atrial size was normal in size. No left atrial/left atrial appendage thrombus  was detected. Right Atrium: Right atrial size was normal in size. Pericardium: There is no evidence of pericardial effusion. Mitral Valve: The mitral valve is rheumatic. There is moderate thickening of the mitral valve leaflet(s). There is mild calcification of the mitral valve leaflet(s). Severe mitral valve regurgitation. No evidence of mitral valve stenosis. Tricuspid Valve: The tricuspid valve  is normal in structure. Tricuspid valve regurgitation is not demonstrated. No evidence of tricuspid stenosis. Aortic Valve: The aortic valve is normal in structure. Aortic valve regurgitation is not visualized. No aortic stenosis is present. Pulmonic Valve: The pulmonic valve was normal in structure. Pulmonic valve regurgitation is not visualized. No evidence of pulmonic stenosis. Aorta: The aortic root is normal in size and structure. Venous: The inferior vena cava is normal in size with greater than 50% respiratory variability, suggesting right atrial pressure of 3 mmHg. IAS/Shunts: No atrial level shunt detected by color flow Doppler. Agitated saline contrast was given intravenously to evaluate for intracardiac shunting. Additional Comments: 3D was performed not requiring image post processing on an independent workstation and was abnormal. MR Peak grad:    116.2 mmHg MR Mean grad:    65.0 mmHg MR Vmax:         539.00 cm/s MR Vmean:        363.0 cm/s MR PISA:         3.08 cm MR PISA Eff ROA: 51 mm MR PISA Radius:  0.70 cm Julien Nordmann MD Electronically signed by Julien Nordmann MD Signature Date/Time: 10/02/2023/5:56:00 PM    Final (Updated)    ECHOCARDIOGRAM LIMITED Result Date: 10/01/2023    ECHOCARDIOGRAM LIMITED REPORT   Patient Name:   Sandra Hines Date of Exam: 10/01/2023 Medical Rec #:  [id number]    Height:       67.0 in Accession #:    [id number]   Weight:       211.0 lb Date of Birth:  Mar 05, 1971   BSA:          2.069 m Patient Age:    52 years     BP:           123/92 mmHg Patient Gender: F            HR:            58 bpm. Exam Location:  [facility] Procedure: Limited Echo, Cardiac Doppler and Color Doppler (Both Spectral and            Color Flow Doppler were utilized during procedure). Indications:     Mitral valve disorder I05.9  History:         Patient has prior history of Echocardiogram examinations, most                  recent 09/27/2023. Risk Factors:Hypertension.  Sonographer:     Cristela Blue Referring Phys:  [id number] CADENCE H FURTH Diagnosing Phys: Yvonne Kendall MD IMPRESSIONS  1. Left ventricular ejection fraction, by estimation, is 55 to 60%. The left ventricle has normal function. The left ventricular internal cavity size was severely dilated. There is mild left ventricular hypertrophy.  2. Left atrial size was severely dilated.  3. 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 No evidence of mitral stenosis.  4. The aortic valve is tricuspid. Aortic valve regurgitation is trivial.  5. There is normal pulmonary artery systolic pressure.  6. The inferior vena cava is normal in size with greater than 50% respiratory variability, suggesting right atrial pressure of 3 mmHg. FINDINGS  Left Ventricle: Left ventricular ejection fraction, by estimation, is 55 to 60%. The left ventricle has normal function. The left ventricular internal cavity size was severely dilated. There is mild left ventricular hypertrophy. Right Ventricle: There is normal pulmonary artery systolic pressure. The tricuspid regurgitant velocity is 1.89 m/s, and with an assumed right atrial pressure of  3 mmHg, the estimated right ventricular systolic pressure is 17.3 mmHg. Left Atrium: Left atrial size was severely dilated. Pericardium: There is no evidence of pericardial effusion. Mitral Valve: The mitral valve is rheumatic. There is severe thickening of the mitral valve leaflet(s). There is moderate calcification of the mitral valve leaflet(s). Severe mitral valve regurgitation. No evidence of mitral valve stenosis. MV peak  gradient, 16.8 mmHg. The mean mitral valve gradient is 4.0 mmHg. Tricuspid Valve: The tricuspid valve is normal in structure. Tricuspid valve regurgitation is trivial. Aortic Valve: The aortic valve is tricuspid. Aortic valve regurgitation is trivial. Pulmonic Valve: The pulmonic valve was not well visualized. Pulmonic valve regurgitation is not visualized. No evidence of pulmonic stenosis. Venous: The inferior vena cava is normal in size with greater than 50% respiratory variability, suggesting right atrial pressure of 3 mmHg. Additional Comments: Spectral Doppler performed.  LEFT VENTRICLE PLAX 2D LVIDd:         6.10 cm LVIDs:         4.10 cm LV PW:         1.20 cm LV IVS:        1.30 cm  LEFT ATRIUM            Index LA Vol (A4C): 125.0 ml 60.42 ml/m  MITRAL VALVE                  TRICUSPID VALVE MV Area (PHT): 1.76 cm       TR Peak grad:   14.3 mmHg MV Peak grad:  16.8 mmHg      TR Vmax:        189.00 cm/s MV Mean grad:  4.0 mmHg MV Vmax:       2.05 m/s MV Vmean:      92.4 cm/s MV Decel Time: 430 msec MR Peak grad:    123.7 mmHg MR Mean grad:    78.0 mmHg MR Vmax:         556.00 cm/s MR Vmean:        413.0 cm/s MR PISA:         7.60 cm MR PISA Eff ROA: 54 mm MR PISA Radius:  1.10 cm MV E velocity: 155.00 cm/s MV A velocity: 90.70 cm/s MV E/A ratio:  1.71 Yvonne Kendall MD Electronically signed by Yvonne Kendall MD Signature Date/Time: 10/01/2023/4:13:14 PM    Final    DG Chest Port 1 View Result Date: 09/28/2023 CLINICAL DATA:  [id number] Respiratory distress [id number] EXAM: PORTABLE CHEST 1 VIEW COMPARISON:  September 25, 2023 FINDINGS: The cardiomediastinal silhouette is unchanged and enlarged in contour. Favor small RIGHT pleural effusion. No pneumothorax. Increased diffuse bilateral airspace opacities with vascular indistinctness and peribronchial cuffing. IMPRESSION: 1. Constellation of findings are favored to reflect pulmonary edema, increased. Differential considerations include atypical infection. 2. RIGHT  pleural effusion. Electronically Signed   By: Meda Klinefelter M.D.   On: 09/28/2023 08:32   ECHOCARDIOGRAM COMPLETE Result Date: 09/27/2023    ECHOCARDIOGRAM REPORT   Patient Name:   Sandra Hines Date of Exam: 09/26/2023 Medical Rec #:  [id number]    Height:       67.0 in Accession #:    [id number]   Weight:       211.0 lb Date of Birth:  [birth year]   BSA:          2.069 m Patient Age:    52 years     BP:  150/137 mmHg Patient Gender: F            HR:           118 bpm. Exam Location:  [facility] Procedure: 2D Echo, Cardiac Doppler and Color Doppler (Both Spectral and Color            Flow Doppler were utilized during procedure). Indications:     Atrial Fibrillation  History:         Patient has no prior history of Echocardiogram examinations.                  CHF, Arrythmias:Atrial Fibrillation; Risk Factors:Hypertension,                  Dyslipidemia and Current Smoker.  Sonographer:     Mikki Harbor Referring Phys:  [id number] JAN A MANSY Diagnosing Phys: Julien Nordmann MD IMPRESSIONS  1. Left ventricular ejection fraction, by estimation, is 45 to 50%. The left ventricle has mildly decreased function. The left ventricle demonstrates global hypokinesis. The left ventricular internal cavity size was mildly dilated. There is mild left ventricular hypertrophy. Left ventricular diastolic parameters are indeterminate.  2. Right ventricular systolic function is mildly reduced. The right ventricular size is normal. There is normal pulmonary artery systolic pressure. The estimated right ventricular systolic pressure is 25.8 mmHg.  3. Left atrial size was severely dilated.  4. 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 No evidence of mitral stenosis. The mean mitral valve gradient is 11.0 mmHg. Moderate mitral annular calcification.  5. Tricuspid valve regurgitation is mild to moderate.  6. The aortic valve is tricuspid. Aortic valve regurgitation is mild. No aortic stenosis is present.  7.  There is borderline dilatation of the ascending aorta, measuring 38 mm.  8. The inferior vena cava is normal in size with greater than 50% respiratory variability, suggesting right atrial pressure of 3 mmHg. FINDINGS  Left Ventricle: Left ventricular ejection fraction, by estimation, is 45 to 50%. The left ventricle has mildly decreased function. The left ventricle demonstrates global hypokinesis. Strain was performed and the global longitudinal strain is indeterminate. The left ventricular internal cavity size was mildly dilated. There is mild left ventricular hypertrophy. Left ventricular diastolic parameters are indeterminate. Right Ventricle: The right ventricular size is normal. No increase in right ventricular wall thickness. Right ventricular systolic function is mildly reduced. There is normal pulmonary artery systolic pressure. The tricuspid regurgitant velocity is 2.39 m/s, and with an assumed right atrial pressure of 3 mmHg, the estimated right ventricular systolic pressure is 25.8 mmHg. Left Atrium: Left atrial size was severely dilated. Right Atrium: Right atrial size was normal in size. Pericardium: There is no evidence of pericardial effusion. Mitral Valve: The mitral valve is rheumatic. There is moderate thickening of the mitral valve leaflet(s). There is mild calcification of the mitral valve leaflet(s). Moderate mitral annular calcification. Severe mitral valve regurgitation. No evidence of  mitral valve stenosis. MV peak gradient, 19.2 mmHg. The mean mitral valve gradient is 11.0 mmHg. Tricuspid Valve: The tricuspid valve is normal in structure. Tricuspid valve regurgitation is mild to moderate. No evidence of tricuspid stenosis. Aortic Valve: The aortic valve is tricuspid. Aortic valve regurgitation is mild. No aortic stenosis is present. Aortic valve mean gradient measures 6.0 mmHg. Aortic valve peak gradient measures 11.2 mmHg. Aortic valve area, by VTI measures 3.69 cm. Pulmonic Valve: The  pulmonic valve was normal in structure. Pulmonic valve regurgitation is not visualized. No evidence of pulmonic stenosis. Aorta:  The aortic root is normal in size and structure. There is borderline dilatation of the ascending aorta, measuring 38 mm. Venous: The inferior vena cava is normal in size with greater than 50% respiratory variability, suggesting right atrial pressure of 3 mmHg. IAS/Shunts: No atrial level shunt detected by color flow Doppler. Additional Comments: 3D was performed not requiring image post processing on an independent workstation and was indeterminate.  LEFT VENTRICLE PLAX 2D LVIDd:         5.80 cm LVIDs:         4.20 cm LV PW:         1.30 cm LV IVS:        1.20 cm LVOT diam:     2.30 cm LV SV:         94 LV SV Index:   45 LVOT Area:     4.15 cm  RIGHT VENTRICLE RV Basal diam:  3.85 cm RV Mid diam:    2.90 cm RV S prime:     14.90 cm/s LEFT ATRIUM              Index        RIGHT ATRIUM           Index LA diam:        4.70 cm  2.27 cm/m   RA Area:     19.00 cm LA Vol (A2C):   125.0 ml 60.42 ml/m  RA Volume:   57.00 ml  27.55 ml/m LA Vol (A4C):   84.3 ml  40.75 ml/m LA Biplane Vol: 107.0 ml 51.72 ml/m  AORTIC VALVE                     PULMONIC VALVE AV Area (Vmax):    3.34 cm      PV Vmax:       0.98 m/s AV Area (Vmean):   3.14 cm      PV Peak grad:  3.9 mmHg AV Area (VTI):     3.69 cm AV Vmax:           167.33 cm/s AV Vmean:          113.267 cm/s AV VTI:            0.254 m AV Peak Grad:      11.2 mmHg AV Mean Grad:      6.0 mmHg LVOT Vmax:         134.50 cm/s LVOT Vmean:        85.500 cm/s LVOT VTI:          0.226 m LVOT/AV VTI ratio: 0.89  AORTA Ao Root diam: 3.60 cm Ao Asc diam:  3.80 cm MITRAL VALVE                  TRICUSPID VALVE MV Area (PHT): 3.21 cm       TR Peak grad:   22.8 mmHg MV Area VTI:   2.97 cm       TR Vmax:        239.00 cm/s MV Peak grad:  19.2 mmHg MV Mean grad:  11.0 mmHg      SHUNTS MV Vmax:       2.19 m/s       Systemic VTI:  0.23 m MV Vmean:      159.0  cm/s     Systemic Diam: 2.30 cm MV Decel Time: 236 msec MR Peak grad:    112.6 mmHg MR Mean grad:  72.0 mmHg MR Vmax:         530.50 cm/s MR Vmean:        396.5 cm/s MR PISA:         9.05 cm MR PISA Eff ROA: 144 mm MR PISA Radius:  1.20 cm MV E velocity: 161.00 cm/s Julien Nordmann MD Electronically signed by Julien Nordmann MD Signature Date/Time: 09/26/2023/3:43:45 PM    Final (Updated)    CT Angio Chest Pulmonary Embolism (PE) W or WO Contrast Result Date: 09/26/2023 CLINICAL DATA:  New onset atrial fib with RVR, not feeling well the past few days, with shortness of breath. EXAM: CT ANGIOGRAPHY CHEST WITH CONTRAST TECHNIQUE: Multidetector CT imaging of the chest was performed using the standard protocol during bolus administration of intravenous contrast. Multiplanar CT image reconstructions and MIPs were obtained to evaluate the vascular anatomy. RADIATION DOSE REDUCTION: This exam was performed according to the departmental dose-optimization program which includes automated exposure control, adjustment of the mA and/or kV according to patient size and/or use of iterative reconstruction technique. CONTRAST:  OMNIPAQUE IOHEXOL 350 MG/ML SOLN COMPARISON:  Portable chest yesterday, chest radiograph 08/23/2015, and chest CT with contrast both 08/23/2015. FINDINGS: Cardiovascular: Since 2017, increased cardiomegaly with left chamber predominance. There is an enlarged pulmonary trunk measuring 3.6 cm indicating arterial hypertension, previously was 2.4 cm. Also noted is reflux of contrast into the IVC and hepatic veins which may suggest right heart dysfunction or tricuspid regurgitation. There is no increased RV/LV ratio and no embolic arterial filling defect is seen. Central pulmonary veins are prominent. There is mild aortic atherosclerosis. Aortic opacification is insufficient to assess the lumen. The great vessels branch normally. Slight aneurysmal prominence is seen in the mid ascending segment which is  4.3 cm on 7:49 burden the aortic root measures 4 cm at the sinuses of Valsalva on 7:52. There is no substantial pericardial effusion. There are no visible coronary calcifications. Mediastinum/Nodes: Multiple calcified bihilar and mediastinal lymph nodes are again shown. There is a 1.7 cm hypodense nodule posteriorly in the right lobe of the thyroid gland. This was not seen previously. Nonemergent follow-up ultrasound is recommended. Axillary spaces are clear. No noncalcified adenopathy is seen in the chest. There are retained secretions versus aspirate in the thoracic trachea. The main bronchi are clear.  The thoracic esophagus is unremarkable. Lungs/Pleura: There is interlobular septal thickening with a basal gradient in the lungs consistent with interstitial edema, moderate. Findings likely due to CHF or fluid overload. There is patchy haziness in the posterior lower lobes which most likely ground-glass edema. Diffuse bronchial thickening is seen and could be congestive, due to bronchitis or reactive airway disease. There are scattered calcified granulomas in both lungs. There is a stable 8 x 7 mm left lower lobe lateral basal nodule on 5:121, stable 4 mm left lower lobe pleural-based nodule laterally on 5:106. Bones are consistent with benign nodules given the length of stability. There is patchy dense peripheral airspace disease in the right upper lobe, a small amount in the right lower lobe perihilar area. This could be due to asymmetric airspace edema or superimposed pneumonia. Upper Abdomen: There are calcified splenic granulomas, occasional calcified hepatic granulomas. No calcified gallstones are seen. There is pericholecystic edema, in this case probably due to congestive edema if there are no localizing symptoms. If there are localizing symptoms, ultrasound may be helpful for further study. No other significant upper abdominal findings. Abdominal aortic atherosclerosis. Musculoskeletal: Degenerative  change and mild kyphosis thoracic spine. No  acute or other significant osseous findings. No mass in the visualized chest wall. Review of the MIP images confirms the above findings. IMPRESSION: 1. No evidence of arterial embolus. 2. Increased cardiomegaly with left chamber predominance since 2017. 3. Enlarged pulmonary trunk 3.6 cm indicating arterial hypertension. 4. Reflux of contrast into the IVC and hepatic veins which may suggest right heart dysfunction or tricuspid regurgitation. 5. 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 Recommend annual imaging followup by CTA or MRA. This recommendation follows 2010 ACCF/AHA/AATS/ACR/ASA/SCA/SCAI/SIR/STS/ SVM Guidelines for the Diagnosis and Management of Patients with Thoracic Aortic Disease. Circulation. 2010; 121: Z610-R604. Aortic aneurysm NOS (ICD10-I71.9). 6. Aortic atherosclerosis. 7. Interstitial edema with a basal gradient in the lungs, with patchy haziness in the posterior lower lobes most likely ground-glass edema. Findings consistent with CHF or fluid overload. 8. Diffuse bronchial thickening which could be congestive, due to bronchitis or reactive airway disease. 9. Patchy dense peripheral airspace disease in the right upper lobe, a small amount in the right lower lobe perihilar area. This could be due to asymmetric airspace edema or superimposed pneumonia. 10. Pericholecystic edema, probably due to congestive edema if there are no localizing symptoms. If there are localizing symptoms, ultrasound may be helpful for further study. 11. 1.7 cm hypodense nodule posteriorly in the right lobe of the thyroid gland. Nonemergent follow-up ultrasound is recommended. 12. Stable left lower lobe nodules. 13. Retained secretions versus aspirate in the thoracic trachea. Aortic Atherosclerosis (ICD10-I70.0). Electronically Signed   By: Almira Bar M.D.   On: 09/26/2023 02:46   DG Chest Portable 1 View Result Date: 09/25/2023 CLINICAL DATA:  Cough and atrial fibrillation.  EXAM: PORTABLE CHEST 1 VIEW COMPARISON:  August 23, 2015 FINDINGS: The cardiac silhouette is mildly enlarged. Mild prominence of the central pulmonary vasculature is seen. Ill-defined subcentimeter calcified pulmonary nodules are seen scattered throughout both lungs. No focal consolidation, pleural effusion or pneumothorax is identified. The visualized skeletal structures are unremarkable. IMPRESSION: 1. Mild cardiomegaly with mild central pulmonary vascular congestion. 2. Findings likely consistent with sequelae associated with prior granulomatous disease. Electronically Signed   By: Aram Candela M.D.   On: 09/25/2023 20:20    Microbiology: Results for orders placed or performed during the hospital encounter of 09/25/23  Resp panel by RT-PCR (RSV, Flu A&B, Covid) Anterior Nasal Swab     Status: None   Collection Time: 09/25/23  5:22 PM   Specimen: Anterior Nasal Swab  Result Value Ref Range Status   SARS Coronavirus 2 by RT PCR NEGATIVE NEGATIVE Final    Comment: (NOTE) SARS-CoV-2 target nucleic acids are NOT DETECTED.  The SARS-CoV-2 RNA is generally detectable in upper respiratory specimens during the acute phase of infection. The lowest concentration of SARS-CoV-2 viral copies this assay can detect is 138 copies/mL. A negative result does not preclude SARS-Cov-2 infection and should not be used as the sole basis for treatment or other patient management decisions. A negative result may occur with  improper specimen collection/handling, submission of specimen other than nasopharyngeal swab, presence of viral mutation(s) within the areas targeted by this assay, and inadequate number of viral copies(<138 copies/mL). A negative result must be combined with clinical observations, patient history, and epidemiological information. The expected result is Negative.  Fact Sheet for Patients:  BloggerCourse.com  Fact Sheet for Healthcare Providers:   SeriousBroker.it  This test is no t yet approved or cleared by the [place] FDA and  has been authorized for detection and/or diagnosis of SARS-CoV-2 by FDA under an  Emergency Use Authorization (EUA). This EUA will remain  in effect (meaning this test can be used) for the duration of the COVID-19 declaration under Section 564(b)(1) of the Act, 21 U.S.C.section 360bbb-3(b)(1), unless the authorization is terminated  or revoked sooner.       Influenza A by PCR NEGATIVE NEGATIVE Final   Influenza B by PCR NEGATIVE NEGATIVE Final    Comment: (NOTE) The Xpert Xpress SARS-CoV-2/FLU/RSV plus assay is intended as an aid in the diagnosis of influenza from Nasopharyngeal swab specimens and should not be used as a sole basis for treatment. Nasal washings and aspirates are unacceptable for Xpert Xpress SARS-CoV-2/FLU/RSV testing.  Fact Sheet for Patients: BloggerCourse.com  Fact Sheet for Healthcare Providers: SeriousBroker.it  This test is not yet approved or cleared by the [place] FDA and has been authorized for detection and/or diagnosis of SARS-CoV-2 by FDA under an Emergency Use Authorization (EUA). This EUA will remain in effect (meaning this test can be used) for the duration of the COVID-19 declaration under Section 564(b)(1) of the Act, 21 U.S.C. section 360bbb-3(b)(1), unless the authorization is terminated or revoked.     Resp Syncytial Virus by PCR NEGATIVE NEGATIVE Final    Comment: (NOTE) Fact Sheet for Patients: BloggerCourse.com  Fact Sheet for Healthcare Providers: SeriousBroker.it  This test is not yet approved or cleared by the [place] FDA and has been authorized for detection and/or diagnosis of SARS-CoV-2 by FDA under an Emergency Use Authorization (EUA). This EUA will remain in effect (meaning this test can be used) for  the duration of the COVID-19 declaration under Section 564(b)(1) of the Act, 21 U.S.C. section 360bbb-3(b)(1), unless the authorization is terminated or revoked.  Performed at [facility], [address] [place], [place] [address]   MRSA Next Gen by PCR, Nasal     Status: None   Collection Time: 09/26/23  1:39 AM   Specimen: Nasal Mucosa; Nasal Swab  Result Value Ref Range Status   MRSA by PCR Next Gen NOT DETECTED NOT DETECTED Final    Comment: (NOTE) The GeneXpert MRSA Assay (FDA approved for NASAL specimens only), is one component of a comprehensive MRSA colonization surveillance program. It is not intended to diagnose MRSA infection nor to guide or monitor treatment for MRSA infections. Test performance is not FDA approved in patients less than 64 years old. Performed at [facility], [address] [place], [place] [address]   Culture, blood (Routine X 2) w Reflex to ID Panel     Status: None   Collection Time: 09/26/23  3:31 AM   Specimen: BLOOD  Result Value Ref Range Status   Specimen Description BLOOD BLOOD LEFT HAND  Final   Special Requests   Final    BOTTLES DRAWN AEROBIC AND ANAEROBIC Blood Culture adequate volume   Culture   Final    NO GROWTH 5 DAYS Performed at [facility], [address] [place], [place] [address]    Report Status 10/01/2023 FINAL  Final  Culture, blood (Routine X 2) w Reflex to ID Panel     Status: None   Collection Time: 09/26/23  3:40 AM   Specimen: BLOOD  Result Value Ref Range Status   Specimen Description BLOOD BLOOD RIGHT HAND  Final   Special Requests   Final    BOTTLES DRAWN AEROBIC AND ANAEROBIC Blood Culture adequate volume   Culture   Final    NO GROWTH 5 DAYS Performed at [facility], [address] [place], [place] [address]    Report Status 10/01/2023 FINAL  Final    Labs: CBC: Recent Labs  Lab 09/29/23 0505 09/30/23 0007 10/01/23 0402 10/01/23 1017 10/03/23 0447  10/03/23 0810 10/03/23 0813  WBC 12.0* 12.3* 10.3 10.5 9.8  --   --   NEUTROABS  --   --   --   --  5.6  --   --   HGB 10.9* 11.2* 11.4* 11.6* 11.6* 11.9* 12.2  HCT 33.1* 34.1* 34.8* 36.2 35.3* 35.0* 36.0  MCV 85.1 85.3 85.5 85.4 85.3  --   --   PLT 124* 145* 166 196 206  --   --    Basic Metabolic Panel: Recent Labs  Lab 09/29/23 0504 09/30/23 0007 10/01/23 1017 10/02/23 0442 10/03/23 0447 10/03/23 0810 10/03/23 0813  NA 135 136 139 139 137 138 141  K 3.3* 3.7 3.1* 3.6 4.2 4.2 4.3  CL 98 98 100 103 105  --   --   CO2 29 29 29 27 22   --   --   GLUCOSE 108* 157* 116* 100* 151*  --   --   BUN 21* 18 20 21* 19  --   --   CREATININE 0.87 0.82 0.96 0.91 0.85  --   --   CALCIUM 7.8* 8.3* 8.9 8.7* 8.6*  --   --   PHOS  --   --   --  3.5 3.4  --   --    Liver Function Tests: Recent Labs  Lab 09/30/23 0007 10/02/23 0442 10/03/23 0447  AST 29  --   --   ALT 81*  --   --   ALKPHOS 116  --   --   BILITOT 0.6  --   --   PROT 6.9  --   --   ALBUMIN 3.0* 2.9* 2.9*   CBG: No results for input(s): "GLUCAP" in the last 168 hours.  Discharge time spent:  38 minutes.  Signed: Loyce Dys, MD [facility] 10/03/2023

## 2023-10-03 NOTE — Anesthesia Postprocedure Evaluation (Signed)
 Anesthesia Post Note  Patient: Sandra Hines  Procedure(s) Performed: ECHOCARDIOGRAM, TRANSESOPHAGEAL  Patient location during evaluation: Specials Recovery Anesthesia Type: General Level of consciousness: awake and alert Pain management: pain level controlled Vital Signs Assessment: post-procedure vital signs reviewed and stable Respiratory status: spontaneous breathing, nonlabored ventilation, respiratory function stable and patient connected to nasal cannula oxygen Cardiovascular status: blood pressure returned to baseline and stable Postop Assessment: no apparent nausea or vomiting Anesthetic complications: no   No notable events documented.   Last Vitals:  Vitals:   10/03/23 1108 10/03/23 1243  BP: 130/87 118/81  Pulse: (!) 56 (!) 54  Resp: 16 18  Temp: 36.8 C 36.7 C  SpO2: 99% 98%    Last Pain:  Vitals:   10/03/23 1243  TempSrc: Oral  PainSc:                  Corinda Gubler

## 2023-10-03 NOTE — Progress Notes (Signed)
 Report called to Misty Stanley, Charity fundraiser at [facility]. Patient will be going to room 13.

## 2023-10-03 NOTE — H&P (Signed)
 History and Physical    Patient: Sandra Hines BMW:[id number] DOB: [birth year] DOA: 10/03/2023 DOS: the patient was seen and examined on 10/03/2023 PCP: Ronnald Ramp, MD  Patient coming from: Outside Hospital  Chief Complaint: No chief complaint on file.  HPI: Sandra Hines is a 53 y.o. female with medical history significant of essential hypertension, cerebral aneurysm s/p repair, tobacco abuse who was admitted to [facility] on March 5 with acute onset of palpitations with, generalized weakness and malaise.  Also cough, dyspnea, orthopnea and PND.  Patient was found to be in A-fib with RVR heart rate up to 176.  She was admitted with atrial fibrillation with rapid ventricular response.  Started on IV heparin.  Echocardiogram obtained.  It shows EF of 45 to 50% and CTA chest showed 4.3 cm ascending aortic aneurysm but no PE.  Cardiology consulted.  Patient later was diagnosed with acute heart failure.  Echo showed severe MR.  Patient had cardiac catheterization today that shows no coronary artery disease.  TEE showed severe MR.  Dr. Gala Romney of cardiology consulted and recommended transfer to [facility] to optimize diastolic heart failure and possible MVR here.  Review of Systems: As mentioned in the history of present illness. All other systems reviewed and are negative. Past Medical History:  Diagnosis Date   Hypertension    Past Surgical History:  Procedure Laterality Date   RIGHT/LEFT HEART CATH AND CORONARY ANGIOGRAPHY N/A 10/03/2023   Procedure: RIGHT/LEFT HEART CATH AND CORONARY ANGIOGRAPHY;  Surgeon: Yvonne Kendall, MD;  Location: [facility] INVASIVE CV LAB;  Service: Cardiovascular;  Laterality: N/A;   Social History:  reports that she has been smoking cigarettes. She has never used smokeless tobacco. She reports that she does not drink alcohol and does not use drugs.  Allergies  Allergen Reactions   Penicillins Hives    Has patient had a PCN  reaction causing immediate rash, facial/tongue/throat swelling, SOB or lightheadedness with hypotension: No Has patient had a PCN reaction causing severe rash involving mucus membranes or skin necrosis: No Has patient had a PCN reaction that required hospitalization: No Has patient had a PCN reaction occurring within the last 10 years: No If all of the above answers are "NO", then may proceed with Cephalosporin use.     No family history on file.  Prior to Admission medications   Medication Sig Start Date End Date Taking? Authorizing Provider  acetaminophen (TYLENOL) 325 MG tablet Take 2 tablets (650 mg total) by mouth every 4 (four) hours as needed for headache or mild pain (pain score 1-3). 10/03/23  Yes Loyce Dys, MD  acetaminophen-caffeine (EXCEDRIN TENSION HEADACHE) 500-65 MG TABS per tablet Take 2 tablets by mouth every 8 (eight) hours as needed (headaches). 10/03/23  Yes Loyce Dys, MD  amiodarone (PACERONE) 400 MG tablet Take 1 tablet (400 mg total) by mouth 2 (two) times daily. 10/03/23  Yes Loyce Dys, MD  buPROPion (WELLBUTRIN SR) 150 MG 12 hr tablet 1 tablet daily for 3 days, then 1 tablet twice daily. Stop smoking 14 days after starting medication 07/19/23 11/16/23 Yes Merita Norton T, FNP  busPIRone (BUSPAR) 5 MG tablet Take 1 tablet (5 mg total) by mouth 3 (three) times daily. 07/19/23  Yes Jacky Kindle, FNP  cyclobenzaprine (FLEXERIL) 5 MG tablet Take 1 tablet (5 mg total) by mouth 3 (three) times daily as needed for muscle spasms. 10/03/23  Yes Loyce Dys, MD  DULoxetine (CYMBALTA) 20 MG capsule  Take 2 capsules (40 mg total) by mouth daily. 08/15/23  Yes Simmons-Robinson, Makiera, MD  heparin 13086 UT/250ML infusion Inject 1,900 Units/hr into the vein continuous. 10/03/23  Yes Loyce Dys, MD  ipratropium-albuterol (DUONEB) 0.5-2.5 (3) MG/3ML SOLN Take 3 mLs by nebulization every 2 (two) hours as needed. Patient taking differently: Take 3 mLs by nebulization every  2 (two) hours as needed (shortness of breath). 10/03/23  Yes Djan, Scarlette Calico, MD  LORazepam (ATIVAN) 1 MG tablet Take 1 tablet (1 mg total) by mouth every 6 (six) hours as needed for anxiety. 10/03/23  Yes Loyce Dys, MD  magnesium hydroxide (MILK OF MAGNESIA) 400 MG/5ML suspension Take 30 mLs by mouth daily as needed for mild constipation or moderate constipation. 10/03/23  Yes Loyce Dys, MD  potassium chloride SA (KLOR-CON M) 20 MEQ tablet Take 2 tablets (40 mEq total) by mouth daily. 10/03/23  Yes Loyce Dys, MD  rosuvastatin (CRESTOR) 40 MG tablet Take 1 tablet (40 mg total) by mouth daily. 07/19/23  Yes Jacky Kindle, FNP  sacubitril-valsartan (ENTRESTO) 24-26 MG Take 1 tablet by mouth 2 (two) times daily. 10/03/23  Yes Loyce Dys, MD  spironolactone (ALDACTONE) 25 MG tablet Take 0.5 tablets (12.5 mg total) by mouth daily. 10/03/23  Yes Loyce Dys, MD  traZODone (DESYREL) 50 MG tablet Take 0.5-1 tablets (25-50 mg total) by mouth at bedtime as needed for sleep. 07/19/23  Yes Jacky Kindle, FNP  furosemide (LASIX) 10 MG/ML injection Inject 8 mLs (80 mg total) into the vein 2 (two) times daily. Patient not taking: Reported on 10/03/2023 10/03/23   Loyce Dys, MD  hydrALAZINE (APRESOLINE) 20 MG/ML injection Inject 0.5 mLs (10 mg total) into the vein every 20 (twenty) minutes as needed (high blood pressure). Patient not taking: Reported on 10/03/2023 10/03/23   Loyce Dys, MD  labetalol (NORMODYNE) 5 MG/ML injection Inject 2 mLs (10 mg total) into the vein every 10 (ten) minutes as needed. Patient not taking: Reported on 10/03/2023 10/03/23   Loyce Dys, MD  Morphine Sulfate (MORPHINE, PF,) 2 MG/ML injection Inject 1 mL (2 mg total) into the vein every 2 (two) hours as needed. Patient not taking: Reported on 10/03/2023 10/03/23   Loyce Dys, MD  ondansetron [facility]) 4 MG/2ML SOLN injection Inject 2 mLs (4 mg total) into the vein every 6 (six) hours as needed for  nausea. Patient not taking: Reported on 10/03/2023 10/03/23   Loyce Dys, MD    Physical Exam: Vitals:   10/03/23 1858 10/03/23 1911  BP:  (!) 131/95  Pulse:  (!) 54  Resp:  20  Temp:  98.5 F (36.9 C)  TempSrc:  Oral  Weight: 91.6 kg   Height:  5\' 7"  (1.702 m)   Constitutional: Depressed, acutely ill looking NAD, calm, comfortable Eyes: PERRL, lids and conjunctivae normal ENMT: Mucous membranes are moist. Posterior pharynx clear of any exudate or lesions.Normal dentition.  Neck: normal, supple, no masses, no thyromegaly Respiratory: clear to auscultation bilaterally, no wheezing, no crackles. Normal respiratory effort. No accessory muscle use.  Cardiovascular: Regular rate and rhythm, grade 4 systolic murmur/ rubs / gallops. No extremity edema. 2+ pedal pulses. No carotid bruits.  Abdomen: no tenderness, no masses palpated. No hepatosplenomegaly. Bowel sounds positive.  Musculoskeletal: Good range of motion, no joint swelling or tenderness, Skin: no rashes, lesions, ulcers. No induration Neurologic: CN 2-12 grossly intact. Sensation intact, DTR normal. Strength 5/5 in all 4.  Psychiatric: Normal judgment and insight. Alert and oriented x 3. Normal mood  Data Reviewed:  Temperature 98.6, blood pressure 109/96, pulse 73 respiratory 30 oxygen sat 90% on room air.  Glucose 151, albumin 2.9, hemoglobin 12.2 cardiac cath done today showed no significant atherosclerotic coronary artery disease  Assessment and Plan:  #1 acute diastolic heart failure: Based on TEE, EF is 55 to 60%.  Patient will continue with diuresis, has been on amlodipine and beta-blocker which has been stopped.  Entresto and spironolactone started.  Also IV Lasix.  Will continue further management by cardiology to optimize patient.  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  TCTS at [facility] aware.  Will continue supportive care.  #3 paroxysmal atrial fibrillation: Patient was originally admitted  with A-fib with RVR.  Currently converted to sinus rhythm.  Rate is controlled.  On IV heparin drip.  Will continue.  #4 hypokalemia: Potassium repleted  #5 tobacco abuse: Counseling provided.  Will offer nicotine patch    Advance Care Planning:   Code Status: Full Code   Consults: Dr. Gala Romney, cardiologist  Family Communication: No family at bedside  Severity of Illness: The appropriate patient status for this patient is INPATIENT. Inpatient status is judged to be reasonable and necessary in order to provide the required intensity of service to ensure the patient's safety. The patient's presenting symptoms, physical exam findings, and initial radiographic and laboratory data in the context of their chronic comorbidities is felt to place them at high risk for further clinical deterioration. Furthermore, it is not anticipated that the patient will be medically stable for discharge from the hospital within 2 midnights of admission.   * I certify that at the point of admission it is my clinical judgment that the patient will require inpatient hospital care spanning beyond 2 midnights from the point of admission due to high intensity of service, high risk for further deterioration and high frequency of surveillance required.*  AuthorLonia Blood, MD 10/03/2023 10:48 PM  For on call review [email or web address].

## 2023-10-03 NOTE — Progress Notes (Signed)
 PHARMACY - ANTICOAGULATION CONSULT NOTE  Pharmacy Consult for Heparin Infusion Indication: atrial fibrillation  Allergies  Allergen Reactions   Penicillins Hives    Has patient had a PCN reaction causing immediate rash, facial/tongue/throat swelling, SOB or lightheadedness with hypotension: No Has patient had a PCN reaction causing severe rash involving mucus membranes or skin necrosis: No Has patient had a PCN reaction that required hospitalization: No Has patient had a PCN reaction occurring within the last 10 years: No If all of the above answers are "NO", then may proceed with Cephalosporin use.    Patient Measurements: Weight: 91.6 kg (201 lb 14.4 oz) Heparin Dosing Weight: 82.6 kg  Vital Signs: Temp: 98.5 F (36.9 C) (03/13 1911) Temp Source: Oral (03/13 1911) BP: 131/95 (03/13 1911) Pulse Rate: 54 (03/13 1911)  Labs: Recent Labs    10/01/23 0402 10/01/23 1017 10/02/23 0442 10/03/23 0447 10/03/23 0810 10/03/23 0813 10/03/23 2204  HGB 11.4* 11.6*  --  11.6* 11.9* 12.2  --   HCT 34.8* 36.2  --  35.3* 35.0* 36.0  --   PLT 166 196  --  206  --   --   --   HEPARINUNFRC 0.41  --  0.40 0.88*  --   --  0.35  CREATININE  --  0.96 0.91 0.85  --   --   --    Estimated Creatinine Clearance: 92 mL/min (by C-G formula based on SCr of 0.85 mg/dL).  Assessment: Sandra Hines is a 53 y.o. female presenting with HF symptoms Patient with newly diagnosed atrial fibrillation and a CHA2DS2-VASc score of at least 3 (age, hypertension, and heart failure) and started on heparin at [facility]. Heparin level therapeutic on 1900 units/hr. Heparin held for cath today and resumed ~1200.  Heparin level tonight after transfer from [facility] came back therapeutic at 0.35, on heparin infusion at 1900 units/hr. No s/sx of bleeding or infusion issues.  Goal of Therapy:  Heparin level 0.3-0.7 units/ml Monitor platelets by anticoagulation protocol: Yes   Plan:  Continue heparin at 1900 units/hr.  Daily  heparin level and CBC  Thank you for allowing pharmacy to participate in this patient's care,  Sherron Monday, PharmD, BCCCP Clinical Pharmacist  Phone: [phone] 10/03/2023 10:35 PM  Please check AMION for all [facility] Pharmacy phone numbers After 10:00 PM, call Main Pharmacy [phone]

## 2023-10-03 NOTE — Progress Notes (Signed)
 PHARMACY - ANTICOAGULATION CONSULT NOTE  Pharmacy Consult for Heparin Infusion Indication: atrial fibrillation  Allergies  Allergen Reactions   Penicillins Hives    Has patient had a PCN reaction causing immediate rash, facial/tongue/throat swelling, SOB or lightheadedness with hypotension: No Has patient had a PCN reaction causing severe rash involving mucus membranes or skin necrosis: No Has patient had a PCN reaction that required hospitalization: No Has patient had a PCN reaction occurring within the last 10 years: No If all of the above answers are "NO", then may proceed with Cephalosporin use.    Patient Measurements: Height: 5\' 7"  (170.2 cm) Weight: 89.7 kg (197 lb 12 oz) IBW/kg (Calculated) : 61.6 Heparin Dosing Weight: 82.6 kg  Vital Signs: Temp: 98.2 F (36.8 C) (03/13 0702) Temp Source: Oral (03/13 0702) BP: 121/76 (03/13 1015) Pulse Rate: 59 (03/13 1015)  Labs: Recent Labs    10/01/23 0402 10/01/23 1017 10/02/23 0442 10/03/23 0447 10/03/23 0810 10/03/23 0813  HGB 11.4* 11.6*  --  11.6* 11.9* 12.2  HCT 34.8* 36.2  --  35.3* 35.0* 36.0  PLT 166 196  --  206  --   --   HEPARINUNFRC 0.41  --  0.40 0.88*  --   --   CREATININE  --  0.96 0.91 0.85  --   --    Estimated Creatinine Clearance: 91 mL/min (by C-G formula based on SCr of 0.85 mg/dL).  Medical History: Past Medical History:  Diagnosis Date   Hypertension    Medications:  Scheduled:   [MAR Hold] amiodarone  400 mg Oral BID   [MAR Hold] buPROPion  150 mg Oral Daily   [MAR Hold] busPIRone  5 mg Oral TID   [MAR Hold] Chlorhexidine Gluconate Cloth  6 each Topical Daily   [MAR Hold] DULoxetine  40 mg Oral Daily   furosemide  40 mg Intravenous Once   furosemide  80 mg Intravenous BID   [MAR Hold] potassium chloride  40 mEq Oral Daily   [MAR Hold] rosuvastatin  40 mg Oral Daily   sacubitril-valsartan  1 tablet Oral BID   sodium chloride flush  3 mL Intravenous Q12H   spironolactone  12.5 mg Oral  Daily   Assessment: Sandra Hines is a 53 y.o. female presenting with palpitations. PMH significant for AVM s/p repair, TUD, HTN. Patient was not on [facility] PTA per chart review. Patient with newly diagnosed atrial fibrillation and a CHA2DS2-VASc score of at least 3 (age, hypertension, and heart failure). Cardiology planning to transition to [facility] once it is clear that no invasive procedures will need to be done this admission. Pharmacy has been consulted to initiate and manage heparin infusion.   Baseline Labs: PT 15.0, INR 1.2, Hgb 12.3, Hct 37.9, Plt 215   Goal of Therapy:  Heparin level 0.3-0.7 units/ml Monitor platelets by anticoagulation protocol: Yes   Date Time HL Rate/Comment  3/6 1745 < 0.10 1300/subtherapeutic 3/7 0259 0.60 1550/therapeutic x1 3/7 0836 0.54 1550/therapeutic x2  3/8 0416 0.24 1550/subtherapeutic  3/8 1324 0.33 1750/therapeutic x1  3/8 1912 0.24 1750/subtherapeutic 3/9 0505 0.32 1900/therapeutic x 1 3/9 1115 0.24 1900/subtherapeutic 3/9 1757 0.36 2050/therapeutic x1 3/10 0007 0.31 2050/therapeutic x 2 3/11     0402    0.41    2050/therapeutic X 4 3/12     0442    0.40    2050/therapeutic X 5  3/13     0447    0.88    2050/elevated, rate decreased to 1900u/h  Plan:  Radial band removed at 1000. Will resume heparin infusion today at 1200 with previous rate of 1900 units/hour.  Next HL ordered for 6hrs after heparin resumed (3/13@1800 ). Continue to monitor H&H and platelets daily while on heparin infusion.  Sandra Hines PharmD, BCPS 10/03/2023 10:28 AM

## 2023-10-03 NOTE — Progress Notes (Signed)
 Spoke to [facility] and 2A Director Misty Stanley) regarding permission/exception for Ms Cona's children to visit.  Was granted permission from [facility] and [facility] for children to visit. Pt is to be transferred to [facility] for surgery. Requested that Ms Fahr notify staff nurse when children are expected to visit so that appropriate people can be notified (front dest, etc).

## 2023-10-03 NOTE — Progress Notes (Addendum)
 A consult to HF Navigation Team has been placed. Unfortunately, this patient no longer meets criteria due to following with advanced HF Team. Will sign off, but continue to follow alongside Advanced Heart Failure Team. Please feel free to reach out with any questions or medication assistance needs.   Please do not hesitate to reach out with questions or concerns,  Enos Fling, PharmD, CPP, BCPS Heart Failure Pharmacist  Phone - [phone] 10/03/2023 7:16 AM

## 2023-10-03 NOTE — Consult Note (Signed)
 Advanced Heart Failure Team Consult Note   Primary Physician: Ronnald Ramp, MD Cardiologist:  Julien Nordmann, MD AHF: Dr. 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 Pola Corn, Internal Medicine.    Sandra Hines is a 53 y/o woman with HTN, cerebral anuerysm s/p repair and tobacco abuse.    Admitted 09/25/23 with HF symptoms. Found to be in AF. Chest CTA showed no PE, arterial HTN, 4.3cm ascending aortic aneurysm, interstitial edema, possible PNA.The patient was given a dose of IV dilt without improvement.    AF resolved with amio.    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 No CAD  - RA 10 PA 63/28 (40) PCWP 29 with v waves to 70 Fick 4.7/2.3 SVR 1600  Post cath, was placed on IV Lasix and started on afterload reduction. Now transferred to [facility] for surgical consultation for MVR and continued HF management by AHF team.   Reports improvement in breathing. No current resting dyspnea. SCr 0.85>>1.07 w/ diuresis yesterday. K 4.5. Maintaining NSR.      Echo 09/26/23 1. Left ventricular ejection fraction, by estimation, is 45 to 50%. The  left ventricle has mildly decreased function. The left ventricle  demonstrates global hypokinesis. The left ventricular internal cavity size  was mildly dilated. There is mild left  ventricular hypertrophy. Left ventricular diastolic parameters are  indeterminate.   2. Right ventricular systolic function is mildly reduced. The right  ventricular size is normal. There is normal pulmonary artery systolic  pressure. The estimated right ventricular systolic pressure is 25.8 mmHg.   3. Left atrial size was severely dilated.   4. 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 No   evidence of mitral stenosis. The mean mitral valve gradient is 11.0 mmHg.  Moderate mitral annular calcification.   5. Tricuspid valve regurgitation is mild to moderate.   6. The aortic valve is tricuspid. Aortic valve regurgitation is mild. No  aortic stenosis is present.   7. There is borderline dilatation of the ascending aorta, measuring 38  mm.   8. The inferior vena cava is normal in size with greater than 50%  respiratory variability, suggesting right atrial pressure of 3 mmHg.    Home Medications Prior to Admission medications   Medication Sig Start Date End Date Taking? Authorizing Provider  acetaminophen (TYLENOL) 325 MG tablet Take 2 tablets (650 mg total) by mouth every 4 (four) hours as needed for headache or mild pain (pain score 1-3). 10/03/23   Loyce Dys, MD  acetaminophen-caffeine (EXCEDRIN TENSION HEADACHE) 500-65 MG TABS per tablet Take 2 tablets by mouth every 8 (eight) hours as needed (headaches). 10/03/23   Loyce Dys, MD  amiodarone (PACERONE) 400 MG tablet Take 1 tablet (400 mg total) by mouth 2 (two) times daily. 10/03/23   Loyce Dys, MD  amLODipine (NORVASC) 10 MG tablet Take 1 tablet (10 mg total) by mouth daily. 09/05/23   Ravon Mcilhenny-Robinson, Tawanna Cooler, MD  buPROPion (WELLBUTRIN SR) 150 MG 12 hr tablet 1 tablet daily for 3 days, then 1 tablet twice daily. Stop smoking 14 days after starting medication Patient not taking: Reported on 09/26/2023 07/19/23 11/16/23  Jacky Kindle, FNP  busPIRone Orbie Hurst)  5 MG tablet Take 1 tablet (5 mg total) by mouth 3 (three) times daily. 07/19/23   Jacky Kindle, FNP  cyclobenzaprine (FLEXERIL) 5 MG tablet Take 1 tablet (5 mg total) by mouth 3 (three) times daily as needed for muscle spasms. 10/03/23   Loyce Dys, MD  DULoxetine (CYMBALTA) 20 MG capsule Take 2 capsules (40 mg total) by mouth daily. 08/15/23   Nettie Cromwell-Robinson, Tawanna Cooler, MD  furosemide (LASIX) 10 MG/ML injection Inject 8 mLs (80 mg total) into the vein 2 (two)  times daily. 10/03/23   Loyce Dys, MD  heparin 78295 UT/250ML infusion Inject 1,900 Units/hr into the vein continuous. 10/03/23   Loyce Dys, MD  hydrALAZINE (APRESOLINE) 20 MG/ML injection Inject 0.5 mLs (10 mg total) into the vein every 20 (twenty) minutes as needed (high blood pressure). 10/03/23   Loyce Dys, MD  ipratropium-albuterol (DUONEB) 0.5-2.5 (3) MG/3ML SOLN Take 3 mLs by nebulization every 2 (two) hours as needed. 10/03/23   Loyce Dys, MD  labetalol (NORMODYNE) 5 MG/ML injection Inject 2 mLs (10 mg total) into the vein every 10 (ten) minutes as needed. 10/03/23   Loyce Dys, MD  LORazepam (ATIVAN) 1 MG tablet Take 1 tablet (1 mg total) by mouth every 6 (six) hours as needed for anxiety. 10/03/23   Loyce Dys, MD  magnesium hydroxide (MILK OF MAGNESIA) 400 MG/5ML suspension Take 30 mLs by mouth daily as needed for mild constipation or moderate constipation. 10/03/23   Loyce Dys, MD  Morphine Sulfate (MORPHINE, PF,) 2 MG/ML injection Inject 1 mL (2 mg total) into the vein every 2 (two) hours as needed. 10/03/23   Loyce Dys, MD  ondansetron (ZOFRAN) 4 MG/2ML SOLN injection Inject 2 mLs (4 mg total) into the vein every 6 (six) hours as needed for nausea. 10/03/23   Loyce Dys, MD  potassium chloride SA (KLOR-CON M) 20 MEQ tablet Take 2 tablets (40 mEq total) by mouth daily. 10/03/23   Loyce Dys, MD  rosuvastatin (CRESTOR) 40 MG tablet Take 1 tablet (40 mg total) by mouth daily. 07/19/23   Jacky Kindle, FNP  sacubitril-valsartan (ENTRESTO) 24-26 MG Take 1 tablet by mouth 2 (two) times daily. 10/03/23   Loyce Dys, MD  spironolactone (ALDACTONE) 25 MG tablet Take 0.5 tablets (12.5 mg total) by mouth daily. 10/03/23   Loyce Dys, MD  traZODone (DESYREL) 50 MG tablet Take 0.5-1 tablets (25-50 mg total) by mouth at bedtime as needed for sleep. 07/19/23   Jacky Kindle, FNP    Past Medical History: Past Medical History:  Diagnosis Date   Hypertension      Past Surgical History: Past Surgical History:  Procedure Laterality Date   RIGHT/LEFT HEART CATH AND CORONARY ANGIOGRAPHY N/A 10/03/2023   Procedure: RIGHT/LEFT HEART CATH AND CORONARY ANGIOGRAPHY;  Surgeon: Yvonne Kendall, MD;  Location: [facility] INVASIVE CV LAB;  Service: Cardiovascular;  Laterality: N/A;   TEE WITHOUT CARDIOVERSION N/A 10/02/2023   Procedure: ECHOCARDIOGRAM, TRANSESOPHAGEAL;  Surgeon: Antonieta Iba, MD;  Location: [facility] ORS;  Service: Cardiovascular;  Laterality: N/A;    Family History: History reviewed. No pertinent family history.  Social History: Social History   Socioeconomic History   Marital status: Single    Spouse name: Not on file   Number of children: 8   Years of education: Not on file   Highest education level: Some college, no degree  Occupational History   Not on file  Tobacco  Use   Smoking status: Every Day    Current packs/day: 1.00    Types: Cigarettes   Smokeless tobacco: Never  Vaping Use   Vaping status: Never Used  Substance and Sexual Activity   Alcohol use: No   Drug use: No   Sexual activity: Yes    Birth control/protection: None, Condom  Other Topics Concern   Not on file  Social History Narrative   Not on file   Social Drivers of Health   Financial Resource Strain: Medium Risk (09/30/2023)   Overall Financial Resource Strain (CARDIA)    Difficulty of Paying Living Expenses: Somewhat hard  Food Insecurity: No Food Insecurity (10/03/2023)   Hunger Vital Sign    Worried About Running Out of Food in the Last Year: Never true    Ran Out of Food in the Last Year: Never true  Transportation Needs: No Transportation Needs (10/03/2023)   PRAPARE - Administrator, Civil Service (Medical): No    Lack of Transportation (Non-Medical): No  Physical Activity: Not on file  Stress: Not on file  Social Connections: Moderately Integrated (10/03/2023)   Social Connection and Isolation Panel [NHANES]    Frequency of  Communication with Friends and Family: Three times a week    Frequency of Social Gatherings with Friends and Family: Once a week    Attends Religious Services: 1 to 4 times per year    Active Member of [employer] or Organizations: Yes    Attends Banker Meetings: 1 to 4 times per year    Marital Status: Never married    Allergies:  Allergies  Allergen Reactions   Penicillins Hives    Has patient had a PCN reaction causing immediate rash, facial/tongue/throat swelling, SOB or lightheadedness with hypotension: No Has patient had a PCN reaction causing severe rash involving mucus membranes or skin necrosis: No Has patient had a PCN reaction that required hospitalization: No Has patient had a PCN reaction occurring within the last 10 years: No If all of the above answers are "NO", then may proceed with Cephalosporin use.     Objective:    Vital Signs:   Temp:  [97.8 F (36.6 C)-98.6 F (37 C)] 98.1 F (36.7 C) (03/14 0819) Pulse Rate:  [54-60] 60 (03/14 0819) Resp:  [16-20] 20 (03/14 0819) BP: (113-131)/(77-95) 113/77 (03/14 0819) SpO2:  [96 %-100 %] 96 % (03/14 0411) Weight:  [90.9 kg-91.6 kg] 90.9 kg (03/14 0500)    Weight change: Filed Weights   10/03/23 1858 10/04/23 0500  Weight: 91.6 kg 90.9 kg    Intake/Output:   Intake/Output Summary (Last 24 hours) at 10/04/2023 1105 Last data filed at 10/04/2023 0953 Gross per 24 hour  Intake 318.29 ml  Output 780 ml  Net -461.71 ml      Physical Exam    General:  fatigued appearing. No resp difficulty HEENT: normal Neck: supple. JVP 8-9 cm . Carotids 2+ bilat; no bruits. No lymphadenopathy or thyromegaly appreciated. Cor: PMI nondisplaced. Regular rate & rhythm. 3/6 MR  Lungs: clear Abdomen: soft, nontender, nondistended. No hepatosplenomegaly. No bruits or masses. Good bowel sounds. Extremities: no cyanosis, clubbing, rash, edema Neuro: alert & orientedx3, cranial nerves grossly intact. moves all 4 extremities  w/o difficulty. Affect pleasant   Telemetry   Sinus bradycardia 56 bpm, personally reviewed   EKG    N/A  Labs   Basic Metabolic Panel: Recent Labs  Lab 09/30/23 0007 10/01/23 1017 10/02/23 0442 10/03/23 0447 10/03/23 0810 10/03/23  0813 10/04/23 0556  NA 136 139 139 137 138 141 135  K 3.7 3.1* 3.6 4.2 4.2 4.3 4.5  CL 98 100 103 105  --   --  104  CO2 29 29 27 22   --   --  23  GLUCOSE 157* 116* 100* 151*  --   --  112*  BUN 18 20 21* 19  --   --  12  CREATININE 0.82 0.96 0.91 0.85  --   --  1.07*  CALCIUM 8.3* 8.9 8.7* 8.6*  --   --  8.9  PHOS  --   --  3.5 3.4  --   --   --     Liver Function Tests: Recent Labs  Lab 09/30/23 0007 10/02/23 0442 10/03/23 0447  AST 29  --   --   ALT 81*  --   --   ALKPHOS 116  --   --   BILITOT 0.6  --   --   PROT 6.9  --   --   ALBUMIN 3.0* 2.9* 2.9*   No results for input(s): "LIPASE", "AMYLASE" in the last 168 hours. No results for input(s): "AMMONIA" in the last 168 hours.  CBC: Recent Labs  Lab 09/30/23 0007 10/01/23 0402 10/01/23 1017 10/03/23 0447 10/03/23 0810 10/03/23 0813 10/04/23 0556  WBC 12.3* 10.3 10.5 9.8  --   --  10.4  NEUTROABS  --   --   --  5.6  --   --   --   HGB 11.2* 11.4* 11.6* 11.6* 11.9* 12.2 12.3  HCT 34.1* 34.8* 36.2 35.3* 35.0* 36.0 39.0  MCV 85.3 85.5 85.4 85.3  --   --  86.7  PLT 145* 166 196 206  --   --  228    Cardiac Enzymes: No results for input(s): "CKTOTAL", "CKMB", "CKMBINDEX", "TROPONINI" in the last 168 hours.  BNP: BNP (last 3 results) Recent Labs    09/25/23 1711  BNP 700.0*    ProBNP (last 3 results) No results for input(s): "PROBNP" in the last 8760 hours.   CBG: No results for input(s): "GLUCAP" in the last 168 hours.  Coagulation Studies: No results for input(s): "LABPROT", "INR" in the last 72 hours.   Imaging   No results found.    Medications:     Current Medications:  amiodarone  400 mg Oral BID   busPIRone  5 mg Oral TID   DULoxetine   40 mg Oral Daily   furosemide  80 mg Intravenous BID   potassium chloride SA  40 mEq Oral Daily   rosuvastatin  40 mg Oral Daily   sacubitril-valsartan  1 tablet Oral BID   sodium chloride flush  3 mL Intravenous Q12H   spironolactone  12.5 mg Oral Daily    Infusions:  sodium chloride     heparin 1,900 Units/hr (10/04/23 0151)      Patient Profile   53 y/o woman with HTN, cerebral anuerysm s/p repair and tobacco abuse, admitted w/ acute CHF and found to be in afib. 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 R/LHC w/ no CAD and elevated biventricular filling pressures. Now transferred from [facility] for surgical consultation for MVR and continued HF management by AHF team.   Assessment/Plan   1. Acute diastolic/valvular HF - Echo 09/26/23 EF 45-50%. RV mildly reduced severe MR  - TEE 10/02/23 EF EF 55-60% RV low normal severe MR - L/R cath 10/03/23. 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 PAF - now in NSR on po amio. Continue - continue heaprin gtt   4. Hypokalemia - resolved, K 4.5 today  - continue spiro    5. Tobacco use  - cessation advised     Length of Stay: 1  Oakley Orban, PA-C  10/04/2023, 11:05 AM  Advanced Heart Failure Team Pager [phone] (M-F; 7a - 5p)  Please contact [facility] for night-coverage after hours (4p -7a ) and weekends on [email or web address]

## 2023-10-03 NOTE — Progress Notes (Signed)
 PHARMACY - ANTICOAGULATION CONSULT NOTE  Pharmacy Consult for Heparin Infusion Indication: atrial fibrillation  Allergies  Allergen Reactions   Penicillins Hives    Has patient had a PCN reaction causing immediate rash, facial/tongue/throat swelling, SOB or lightheadedness with hypotension: No Has patient had a PCN reaction causing severe rash involving mucus membranes or skin necrosis: No Has patient had a PCN reaction that required hospitalization: No Has patient had a PCN reaction occurring within the last 10 years: No If all of the above answers are "NO", then may proceed with Cephalosporin use.    Patient Measurements: Weight: 91.6 kg (201 lb 14.4 oz) Heparin Dosing Weight: 82.6 kg  Vital Signs: Temp: 98.5 F (36.9 C) (03/13 1911) Temp Source: Oral (03/13 1911) BP: 131/95 (03/13 1911) Pulse Rate: 54 (03/13 1911)  Labs: Recent Labs    10/01/23 0402 10/01/23 1017 10/02/23 0442 10/03/23 0447 10/03/23 0810 10/03/23 0813  HGB 11.4* 11.6*  --  11.6* 11.9* 12.2  HCT 34.8* 36.2  --  35.3* 35.0* 36.0  PLT 166 196  --  206  --   --   HEPARINUNFRC 0.41  --  0.40 0.88*  --   --   CREATININE  --  0.96 0.91 0.85  --   --    Estimated Creatinine Clearance: 92 mL/min (by C-G formula based on SCr of 0.85 mg/dL).  Assessment: Sandra Hines is a 53 y.o. female presenting with HF symptoms Patient with newly diagnosed atrial fibrillation and a CHA2DS2-VASc score of at least 3 (age, hypertension, and heart failure) and started on heparin at [facility]. Heparin level therapeutic on 1900 units/hr. Heparin held for cath today and resumed ~1200.  Goal of Therapy:  Heparin level 0.3-0.7 units/ml Monitor platelets by anticoagulation protocol: Yes   Plan:  Continue heparin at 1900 units/hr.  F/u heparin level now Daily heparin level and CBC  Christoper Fabian, PharmD, BCPS Please see amion for complete clinical pharmacist phone list 10/03/2023 9:19 PM

## 2023-10-03 NOTE — Consult Note (Signed)
 Advanced Heart Failure Team Consult Note   Primary Physician: Ronnald Ramp, MD Cardiologist:  Julien Nordmann, MD  Reason for Consultation: Acute diastolic HF/severe MR  HPI:    Sandra Hines is seen today for evaluation of acute diastolic HF/Severe MR at the request of Dr. Okey Dupre.   Sandra Hines is a 53 y/o woman with HTN,cerebral anuerysm s/p repair, tobacco abuse   Admitted 09/25/23 with HF symptoms. Found to be in AF. Chest CTA showed no PE, arterial HTN, 4.3cm ascending aortic aneurysm, interstitial edema, possible PNA.The patient was given a dose of IV dilt without improvement.   AF resolved with amio.   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 No CAD  - RA 10 PA 63/28 (40) PCWP 29 with v waves to 70 Fick 4.7/2.3 SVR 1600  Breathing better but still SOB with exertion.   Home Medications Prior to Admission medications   Medication Sig Start Date End Date Taking? Authorizing Provider  amLODipine (NORVASC) 10 MG tablet Take 1 tablet (10 mg total) by mouth daily. 09/05/23  Yes Simmons-Robinson, Makiera, MD  busPIRone (BUSPAR) 5 MG tablet Take 1 tablet (5 mg total) by mouth 3 (three) times daily. 07/19/23  Yes Jacky Kindle, FNP  DULoxetine (CYMBALTA) 20 MG capsule Take 2 capsules (40 mg total) by mouth daily. 08/15/23  Yes Simmons-Robinson, Makiera, MD  rosuvastatin (CRESTOR) 40 MG tablet Take 1 tablet (40 mg total) by mouth daily. 07/19/23  Yes Jacky Kindle, FNP  traZODone (DESYREL) 50 MG tablet Take 0.5-1 tablets (25-50 mg total) by mouth at bedtime as needed for sleep. 07/19/23  Yes Jacky Kindle, FNP  buPROPion (WELLBUTRIN SR) 150 MG 12 hr tablet 1 tablet daily for 3 days, then 1 tablet twice daily. Stop smoking 14 days after starting medication Patient not taking: Reported on 09/26/2023 07/19/23 11/16/23  Jacky Kindle, FNP    Past Medical History: Past Medical History:  Diagnosis  Date   Hypertension     Past Surgical History: History reviewed. No pertinent surgical history.  Family History: History reviewed. No pertinent family history.  Social History: Social History   Socioeconomic History   Marital status: Single    Spouse name: Not on file   Number of children: 8   Years of education: Not on file   Highest education level: Some college, no degree  Occupational History   Not on file  Tobacco Use   Smoking status: Every Day    Current packs/day: 1.00    Types: Cigarettes   Smokeless tobacco: Never  Vaping Use   Vaping status: Never Used  Substance and Sexual Activity   Alcohol use: No   Drug use: No   Sexual activity: Yes    Birth control/protection: None, Condom  Other Topics Concern   Not on file  Social History Narrative   Not on file   Social Drivers of Health   Financial Resource Strain: Medium Risk (09/30/2023)   Overall Financial Resource Strain (CARDIA)    Difficulty of Paying Living Expenses: Somewhat hard  Food Insecurity: No Food Insecurity (09/26/2023)   Hunger Vital Sign    Worried About Running Out of Food in the Last Year: Never true    Ran Out of Food in the Last Year: Never true  Transportation Needs: No Transportation Needs (09/30/2023)   PRAPARE - Transportation    Lack of Transportation (  Medical): No    Lack of Transportation (Non-Medical): No  Physical Activity: Not on file  Stress: Not on file  Social Connections: Not on file    Allergies:  Allergies  Allergen Reactions   Penicillins Hives    Has patient had a PCN reaction causing immediate rash, facial/tongue/throat swelling, SOB or lightheadedness with hypotension: No Has patient had a PCN reaction causing severe rash involving mucus membranes or skin necrosis: No Has patient had a PCN reaction that required hospitalization: No Has patient had a PCN reaction occurring within the last 10 years: No If all of the above answers are "NO", then may proceed with  Cephalosporin use.     Objective:    Vital Signs:   Temp:  [97.9 F (36.6 C)-98.3 F (36.8 C)] 98.2 F (36.8 C) (03/13 1108) Pulse Rate:  [0-73] 56 (03/13 1108) Resp:  [11-30] 16 (03/13 1108) BP: (99-139)/(69-104) 130/87 (03/13 1108) SpO2:  [90 %-100 %] 99 % (03/13 1108) Last BM Date : 10/02/23  Weight change: Filed Weights   09/25/23 1703 09/26/23 0137 10/01/23 2300  Weight: 94.8 kg 95.7 kg 89.7 kg    Intake/Output:   Intake/Output Summary (Last 24 hours) at 10/03/2023 1150 Last data filed at 10/03/2023 1001 Gross per 24 hour  Intake 510 ml  Output 600 ml  Net -90 ml      Physical Exam    General:  Sitting up in bed . No resp difficulty HEENT: normal edentuolous Neck: supple. JVP . Carotids 2+ bilat; no bruits. No lymphadenopathy or thyromegaly appreciated. Cor: PMI nondisplaced. Regular rate & rhythm. 3+ MR Lungs: clear Abdomen: obese soft, nontender, nondistended. No hepatosplenomegaly. No bruits or masses. Good bowel sounds. Extremities: no cyanosis, clubbing, rash, edema Neuro: alert & orientedx3, cranial nerves grossly intact. moves all 4 extremities w/o difficulty. Affect pleasant   Telemetry   Sinus 50-60s Personally reviewed  EKG    Sinus LVH. 68  Initial ECG AF 184 Personally reviewed  Labs   Basic Metabolic Panel: Recent Labs  Lab 09/29/23 0504 09/30/23 0007 10/01/23 1017 10/02/23 0442 10/03/23 0447 10/03/23 0810 10/03/23 0813  NA 135 136 139 139 137 138 141  K 3.3* 3.7 3.1* 3.6 4.2 4.2 4.3  CL 98 98 100 103 105  --   --   CO2 29 29 29 27 22   --   --   GLUCOSE 108* 157* 116* 100* 151*  --   --   BUN 21* 18 20 21* 19  --   --   CREATININE 0.87 0.82 0.96 0.91 0.85  --   --   CALCIUM 7.8* 8.3* 8.9 8.7* 8.6*  --   --   PHOS  --   --   --  3.5 3.4  --   --     Liver Function Tests: Recent Labs  Lab 09/30/23 0007 10/02/23 0442 10/03/23 0447  AST 29  --   --   ALT 81*  --   --   ALKPHOS 116  --   --   BILITOT 0.6  --   --    PROT 6.9  --   --   ALBUMIN 3.0* 2.9* 2.9*   No results for input(s): "LIPASE", "AMYLASE" in the last 168 hours. No results for input(s): "AMMONIA" in the last 168 hours.  CBC: Recent Labs  Lab 09/29/23 0505 09/30/23 0007 10/01/23 0402 10/01/23 1017 10/03/23 0447 10/03/23 0810 10/03/23 0813  WBC 12.0* 12.3* 10.3 10.5 9.8  --   --  NEUTROABS  --   --   --   --  5.6  --   --   HGB 10.9* 11.2* 11.4* 11.6* 11.6* 11.9* 12.2  HCT 33.1* 34.1* 34.8* 36.2 35.3* 35.0* 36.0  MCV 85.1 85.3 85.5 85.4 85.3  --   --   PLT 124* 145* 166 196 206  --   --     Cardiac Enzymes: No results for input(s): "CKTOTAL", "CKMB", "CKMBINDEX", "TROPONINI" in the last 168 hours.  BNP: BNP (last 3 results) Recent Labs    09/25/23 1711  BNP 700.0*    ProBNP (last 3 results) No results for input(s): "PROBNP" in the last 8760 hours.   CBG: No results for input(s): "GLUCAP" in the last 168 hours.  Coagulation Studies: No results for input(s): "LABPROT", "INR" in the last 72 hours.   Imaging   CARDIAC CATHETERIZATION Result Date: 10/03/2023 Conclusions: No angiographically significant atherosclerotic coronary artery disease.  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 Moderately elevated left heart, right heart, and pulmonary artery pressures.  Large v-waves observed on PCWP tracing consistent with the patient's severe mitral regurgitation. Low normal to mildly reduced Fick cardiac output/index. Recommendations: Escalate diuresis and consider consultation with advanced heart failure team for optimization of acute diastolic heart failure in the setting of severe mitral valve regurgitation. Cardiac surgery consultation for mitral valve replacement.  Response to medical therapy will dictate if this can be done as an outpatient or if the patient will need transfer to [facility] for inpatient evaluation. Resume heparin infusion 2 hours after TR band removal. Primary prevention of coronary artery  disease. Yvonne Kendall, MD [facility]  ECHO TEE Result Date: 10/03/2023    TRANSESOPHOGEAL ECHO REPORT   Patient Name:   Sandra Hines Date of Exam: 10/02/2023 Medical Rec #:  [id number]    Height:       67.0 in Accession #:    [id number]   Weight:       197.8 lb Date of Birth:  1971-02-14   BSA:          2.013 m Patient Age:    52 years     BP:           121/88 mmHg Patient Gender: F            HR:           59 bpm. Exam Location:  [facility] Procedure: 3D Echo, Transesophageal Echo, Cardiac Doppler, Color Doppler and            Saline Contrast Bubble Study (Both Spectral and Color Flow Doppler            were utilized during procedure). Indications:     Severe Mitral valve regurgitation  History:         Patient has prior history of Echocardiogram examinations, most                  recent 10/01/2023. Risk Factors:Hypertension.  Sonographer:     Cristela Blue Referring Phys:  [id number] [facility] CAREY Diagnosing Phys: Julien Nordmann MD PROCEDURE: After discussion of the risks and benefits of a TEE, an informed consent was obtained from the patient. TEE procedure time was 30 minutes. The transesophogeal probe was passed without difficulty through the esophogus of the patient. Imaged were obtained with the patient in a left lateral decubitus position. Local oropharyngeal anesthetic was provided with Cetacaine and viscous lidocaine. Sedation performed by different physician. The patient was monitored while  under deep sedation. Image quality was excellent. The patient's vital signs; including heart rate, blood pressure, and oxygen saturation; remained stable throughout the procedure. The patient developed no complications during the procedure.  IMPRESSIONS  1. Left ventricular ejection fraction, by estimation, is 55 to 60%. The left ventricle has normal function. The left ventricle has no regional wall motion abnormalities.  2. Right ventricular systolic function is low normal. The right ventricular size is normal.  3. The  mitral valve is rheumatic, both anterior and posterior leaflets involved. Severe mitral valve regurgitation, central and eccentric jet noted. No evidence of mitral stenosis.  4. The aortic valve is normal in structure. Aortic valve regurgitation is not visualized. No aortic stenosis is present.  5. The inferior vena cava is normal in size with greater than 50% respiratory variability, suggesting right atrial pressure of 3 mmHg.  6. No left atrial/left atrial appendage thrombus was detected.  7. 3D performed of the mitral valve and demonstrates rheumatic mitral valve. Conclusion(s)/Recommendation(s): hemodynamically significant mitral valvular heart disease as detailed above. FINDINGS  Left Ventricle: Left ventricular ejection fraction, by estimation, is 55 to 60%. The left ventricle has normal function. The left ventricle has no regional wall motion abnormalities. The left ventricular internal cavity size was normal in size. There is  no left ventricular hypertrophy. Right Ventricle: The right ventricular size is normal. No increase in right ventricular wall thickness. Right ventricular systolic function is low normal. Left Atrium: Left atrial size was normal in size. No left atrial/left atrial appendage thrombus was detected. Right Atrium: Right atrial size was normal in size. Pericardium: There is no evidence of pericardial effusion. Mitral Valve: The mitral valve is rheumatic. There is moderate thickening of the mitral valve leaflet(s). There is mild calcification of the mitral valve leaflet(s). Severe mitral valve regurgitation. No evidence of mitral valve stenosis. Tricuspid Valve: The tricuspid valve is normal in structure. Tricuspid valve regurgitation is not demonstrated. No evidence of tricuspid stenosis. Aortic Valve: The aortic valve is normal in structure. Aortic valve regurgitation is not visualized. No aortic stenosis is present. Pulmonic Valve: The pulmonic valve was normal in structure. Pulmonic  valve regurgitation is not visualized. No evidence of pulmonic stenosis. Aorta: The aortic root is normal in size and structure. Venous: The inferior vena cava is normal in size with greater than 50% respiratory variability, suggesting right atrial pressure of 3 mmHg. IAS/Shunts: No atrial level shunt detected by color flow Doppler. Agitated saline contrast was given intravenously to evaluate for intracardiac shunting. Additional Comments: 3D was performed not requiring image post processing on an independent workstation and was abnormal. MR Peak grad:    116.2 mmHg MR Mean grad:    65.0 mmHg MR Vmax:         539.00 cm/s MR Vmean:        363.0 cm/s MR PISA:         3.08 cm MR PISA Eff ROA: 51 mm MR PISA Radius:  0.70 cm Julien Nordmann MD Electronically signed by Julien Nordmann MD Signature Date/Time: 10/02/2023/5:56:00 PM    Final (Updated)      Medications:     Current Medications:  amiodarone  400 mg Oral BID   buPROPion  150 mg Oral Daily   busPIRone  5 mg Oral TID   Chlorhexidine Gluconate Cloth  6 each Topical Daily   DULoxetine  40 mg Oral Daily   furosemide  80 mg Intravenous BID   potassium chloride  40 mEq Oral Daily  rosuvastatin  40 mg Oral Daily   sacubitril-valsartan  1 tablet Oral BID   sodium chloride flush  3 mL Intravenous Q12H   spironolactone  12.5 mg Oral Daily    Infusions:  sodium chloride Stopped (10/03/23 1000)   sodium chloride     heparin        Assessment/Plan   1. Acute diastolic/valvular HF - Echo 09/26/23 EF 45-50%. RV mildly reduced severe MR  - TEE 10/02/23 EF EF 55-60% RV low normal severe MR - L/R cath 10/03/23. No CAD  - RA 10 PA 63/28 (40) PCWP 29 with v waves to 70 Fick 4.7/2.3 SVR 1600 - Needs diuresis and afterload reduction - Stop amlodipine and b-blocker - Start Entresto and spiro - IV lasix  2. 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 PAF - now in NSR  on po amio. Continue - continue heaprin  4. Hypokalemia - supp as needed. Start spiro  5. Tobacco use  - cessation advised   Discussed with [facility], [facility] and TCTS. Will plan transfer to [facility] on [facility] service with AHF consultation. Possible MVR early next week.    Length of Stay: 8  Arvilla Meres, MD  10/03/2023, 11:50 AM  Advanced Heart Failure Team Pager [phone] (M-F; 7a - 5p)  Please contact [facility] for night-coverage after hours (4p -7a ) and weekends on [email or web address]

## 2023-10-03 NOTE — Progress Notes (Signed)
 Patient left 2A alert with no distress noted by stretcher with [facility]. Patient's youngest 3 children visited prior to her leaving.

## 2023-10-04 ENCOUNTER — Other Ambulatory Visit: Payer: Self-pay

## 2023-10-04 ENCOUNTER — Encounter (HOSPITAL_COMMUNITY)

## 2023-10-04 ENCOUNTER — Inpatient Hospital Stay (HOSPITAL_COMMUNITY)

## 2023-10-04 ENCOUNTER — Encounter (HOSPITAL_COMMUNITY): Payer: Self-pay | Admitting: Internal Medicine

## 2023-10-04 DIAGNOSIS — I480 Paroxysmal atrial fibrillation: Secondary | ICD-10-CM

## 2023-10-04 DIAGNOSIS — I5033 Acute on chronic diastolic (congestive) heart failure: Secondary | ICD-10-CM

## 2023-10-04 DIAGNOSIS — I5031 Acute diastolic (congestive) heart failure: Secondary | ICD-10-CM

## 2023-10-04 DIAGNOSIS — I340 Nonrheumatic mitral (valve) insufficiency: Secondary | ICD-10-CM | POA: Diagnosis not present

## 2023-10-04 LAB — BASIC METABOLIC PANEL
Anion gap: 8 (ref 5–15)
BUN: 12 mg/dL (ref 6–20)
CO2: 23 mmol/L (ref 22–32)
Calcium: 8.9 mg/dL (ref 8.9–10.3)
Chloride: 104 mmol/L (ref 98–111)
Creatinine, Ser: 1.07 mg/dL — ABNORMAL HIGH (ref 0.44–1.00)
GFR, Estimated: >60 mL/min (ref >60)
Glucose, Bld: 112 mg/dL — ABNORMAL HIGH (ref 70–99)
Potassium: 4.5 mmol/L (ref 3.5–5.1)
Sodium: 135 mmol/L (ref 135–145)

## 2023-10-04 LAB — CBC
HCT: 39 % (ref 36.0–46.0)
Hemoglobin: 12.3 g/dL (ref 12.0–15.0)
MCH: 27.3 pg (ref 26.0–34.0)
MCHC: 31.5 g/dL (ref 30.0–36.0)
MCV: 86.7 fL (ref 80.0–100.0)
Platelets: 228 10*3/uL (ref 150–400)
RBC: 4.5 MIL/uL (ref 3.87–5.11)
RDW: 14.8 % (ref 11.5–15.5)
WBC: 10.4 10*3/uL (ref 4.0–10.5)
nRBC: 0 % (ref 0.0–0.2)

## 2023-10-04 LAB — HEPARIN LEVEL (UNFRACTIONATED): Heparin Unfractionated: 0.5 [IU]/mL (ref 0.30–0.70)

## 2023-10-04 LAB — PULMONARY FUNCTION TEST
FEF 25-75 Pre: 1.47 L/s
FEF2575-%Pred-Pre: 50 %
FEV1-%Pred-Pre: 53 %
FEV1-Pre: 1.64 L
FEV1FVC-%Pred-Pre: 97 %
FEV6-%Pred-Pre: 55 %
FEV6-Pre: 2.1 L
FEV6FVC-%Pred-Pre: 102 %
FVC-%Pred-Pre: 53 %
FVC-Pre: 2.1 L
Pre FEV1/FVC ratio: 78 %
Pre FEV6/FVC Ratio: 100 %

## 2023-10-04 MED ORDER — TRANEXAMIC ACID (OHS) BOLUS VIA INFUSION
15.0000 mg/kg | INTRAVENOUS | Status: AC
  Administered 2023-10-07: 1363.5 mg via INTRAVENOUS
  Filled 2023-10-04: qty 1364

## 2023-10-04 MED ORDER — POTASSIUM CHLORIDE 2 MEQ/ML IV SOLN
80.0000 meq | INTRAVENOUS | Status: DC
Start: 2023-10-07 — End: 2023-10-07
  Filled 2023-10-04: qty 40

## 2023-10-04 MED ORDER — ORAL CARE MOUTH RINSE: 15.0000 mL | OROMUCOSAL | Status: DC | PRN

## 2023-10-04 MED ORDER — MANNITOL 20 % IV SOLN
INTRAVENOUS | Status: DC
  Filled 2023-10-04 (×2): qty 13

## 2023-10-04 MED ORDER — VANCOMYCIN HCL 1000 MG IV SOLR
INTRAVENOUS | Status: DC
  Filled 2023-10-04: qty 20

## 2023-10-04 MED ORDER — DEXMEDETOMIDINE HCL IN NACL 400 MCG/100ML IV SOLN
0.1000 ug/kg/h | INTRAVENOUS | Status: AC
  Administered 2023-10-07: .7 ug/kg/h via INTRAVENOUS
  Filled 2023-10-04: qty 100

## 2023-10-04 MED ORDER — NITROGLYCERIN IN D5W 200-5 MCG/ML-% IV SOLN
2.0000 ug/min | INTRAVENOUS | Status: DC
  Filled 2023-10-04: qty 250

## 2023-10-04 MED ORDER — HYDRALAZINE HCL 10 MG PO TABS
10.0000 mg | ORAL_TABLET | Freq: Three times a day (TID) | ORAL | Status: DC
  Administered 2023-10-04 – 2023-10-05 (×3): 10 mg via ORAL
  Filled 2023-10-04 (×3): qty 1

## 2023-10-04 MED ORDER — CEFAZOLIN SODIUM-DEXTROSE 2-4 GM/100ML-% IV SOLN
2.0000 g | INTRAVENOUS | Status: AC
Start: 2023-10-07 — End: 2023-10-07
  Administered 2023-10-07 (×2): 2 g via INTRAVENOUS
  Filled 2023-10-04: qty 100

## 2023-10-04 MED ORDER — PLASMA-LYTE A IV SOLN
INTRAVENOUS | Status: DC
  Filled 2023-10-04: qty 2.5

## 2023-10-04 MED ORDER — HEPARIN 30,000 UNITS/1000 ML (OHS) CELLSAVER SOLUTION
Status: DC
  Filled 2023-10-04: qty 1000

## 2023-10-04 MED ORDER — PHENYLEPHRINE HCL-NACL 20-0.9 MG/250ML-% IV SOLN
30.0000 ug/min | INTRAVENOUS | Status: AC
  Administered 2023-10-07: 30 ug/min via INTRAVENOUS
  Filled 2023-10-04: qty 250

## 2023-10-04 MED ORDER — NOREPINEPHRINE 4 MG/250ML-% IV SOLN
0.0000 ug/min | INTRAVENOUS | Status: DC
  Filled 2023-10-04: qty 250

## 2023-10-04 MED ORDER — TRANEXAMIC ACID 1000 MG/10ML IV SOLN
1.5000 mg/kg/h | INTRAVENOUS | Status: AC
  Administered 2023-10-07: 1.5 mg/kg/h via INTRAVENOUS
  Filled 2023-10-04: qty 25

## 2023-10-04 MED ORDER — CEFAZOLIN SODIUM-DEXTROSE 2-4 GM/100ML-% IV SOLN
2.0000 g | INTRAVENOUS | Status: DC
  Filled 2023-10-04: qty 100

## 2023-10-04 MED ORDER — VANCOMYCIN HCL 1500 MG/300ML IV SOLN
1500.0000 mg | INTRAVENOUS | Status: AC
  Administered 2023-10-07: 1500 mg via INTRAVENOUS
  Filled 2023-10-04: qty 300

## 2023-10-04 MED ORDER — MILRINONE LACTATE IN DEXTROSE 20-5 MG/100ML-% IV SOLN
0.3000 ug/kg/min | INTRAVENOUS | Status: DC
  Filled 2023-10-04: qty 100

## 2023-10-04 MED ORDER — TRANEXAMIC ACID (OHS) PUMP PRIME SOLUTION
2.0000 mg/kg | INTRAVENOUS | Status: DC
  Filled 2023-10-04: qty 1.82

## 2023-10-04 MED ORDER — MORPHINE SULFATE (PF) 2 MG/ML IV SOLN: 1.0000 mg | INTRAVENOUS | Status: DC | PRN

## 2023-10-04 MED ORDER — EPINEPHRINE HCL 5 MG/250ML IV SOLN IN NS
0.0000 ug/min | INTRAVENOUS | Status: DC
  Filled 2023-10-04: qty 250

## 2023-10-04 MED ORDER — INSULIN REGULAR(HUMAN) IN NACL 100-0.9 UT/100ML-% IV SOLN
INTRAVENOUS | Status: AC
  Administered 2023-10-07: 1.3 [IU]/h via INTRAVENOUS
  Filled 2023-10-04: qty 100

## 2023-10-04 NOTE — Progress Notes (Signed)
 PHARMACY - ANTICOAGULATION CONSULT NOTE  Pharmacy Consult for Heparin Infusion Indication: atrial fibrillation  Allergies  Allergen Reactions   Penicillins Hives    Has patient had a PCN reaction causing immediate rash, facial/tongue/throat swelling, SOB or lightheadedness with hypotension: No Has patient had a PCN reaction causing severe rash involving mucus membranes or skin necrosis: No Has patient had a PCN reaction that required hospitalization: No Has patient had a PCN reaction occurring within the last 10 years: No If all of the above answers are "NO", then may proceed with Cephalosporin use.    Patient Measurements: Height: 5\' 7"  (170.2 cm) Weight: 90.9 kg (200 lb 8 oz) IBW/kg (Calculated) : 61.6 Heparin Dosing Weight: 82.6 kg  Vital Signs: Temp: 97.9 F (36.6 C) (03/14 1236) Temp Source: Axillary (03/14 1236) BP: 117/72 (03/14 1448) Pulse Rate: 57 (03/14 1236)  Labs: Recent Labs    10/02/23 0442 10/02/23 0442 10/03/23 0447 10/03/23 0810 10/03/23 0813 10/03/23 2204 10/04/23 0556  HGB  --    < > 11.6* 11.9* 12.2  --  12.3  HCT  --    < > 35.3* 35.0* 36.0  --  39.0  PLT  --   --  206  --   --   --  228  HEPARINUNFRC 0.40  --  0.88*  --   --  0.35 0.50  CREATININE 0.91  --  0.85  --   --   --  1.07*   < > = values in this interval not displayed.   Estimated Creatinine Clearance: 72.8 mL/min (A) (by C-G formula based on SCr of 1.07 mg/dL (H)).  Assessment: ALEE GRESSMAN is a 53 y.o. female presenting with HF symptoms Patient with newly diagnosed atrial fibrillation and a CHA2DS2-VASc score of at least 3 (age, hypertension, and heart failure) and started on heparin at [facility]. Heparin level therapeutic on 1900 units/hr. Heparin held for cath  and resumed after 3/13  Heparin level  therapeutic at 0.5 on heparin infusion at 1900 units/hr. No s/sx of bleeding or infusion issues. Cbc stable planning OR 3/17  Goal of Therapy:  Heparin level 0.3-0.7 units/ml Monitor  platelets by anticoagulation protocol: Yes   Plan:  Continue heparin at 1900 units/hr.  Daily heparin level and CBC    Leota Sauers Pharm.D. CPP, BCPS Clinical Pharmacist [phone] 10/04/2023 3:24 PM    Please check AMION for all [facility] phone numbers After 10:00 PM, call Main Pharmacy [phone]

## 2023-10-04 NOTE — TOC Initial Note (Signed)
 Transition of Care [facility]) - Initial/Assessment Note    Patient Details  Name: Sandra Hines MRN: [id number] Date of Birth: 03/04/1971  Transition of [facility]) CM/SW Contact:    Nicanor Bake Phone Number: [phone] 10/04/2023, 2:59 PM  Clinical Narrative: HF CSW met with pt at bedside. Pt stated that she lives with adult and minor children in the home. Pt stated that she has no history of HH services. Pt stated that she does not use any equipment. Pt stated that she has a scale at home. Pt stated that she has a PCP. CSW explained that a hospital follow up appt. Will be scheduled closer to dc. Pt agrees. 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 Pt stated that  her brother will provide transportation home at dc.   Patient inquired about Medicaid. Pt stated that she wants regular insurance through [employer] and unsure about how she was given this plan. CSW will send information to the financial counselors.  TOC will continue following.                         Patient Goals and CMS Choice            Expected Discharge Plan and Services                                              Prior Living Arrangements/Services                       Activities of Daily Living   ADL Screening (condition at time of admission) Independently performs ADLs?: Yes (appropriate for developmental age) Is the patient deaf or have difficulty hearing?: No Does the patient have difficulty seeing, even when wearing glasses/contacts?: No Does the patient have difficulty concentrating, remembering, or making decisions?: No  Permission Sought/Granted                  Emotional Assessment              Admission diagnosis:  Acute CHF (congestive heart failure) (HCC) [I50.9] A-fib (HCC) [I48.91] Acute exacerbation of CHF (congestive heart failure) (HCC) [I50.9] Patient Active Problem List   Diagnosis Date Noted   Acute exacerbation of CHF  (congestive heart failure) (HCC) 10/03/2023   Acute diastolic CHF (congestive heart failure) (HCC) 09/29/2023   Severe mitral valve regurgitation 09/28/2023   Atrial fibrillation with rapid ventricular response (HCC) 09/25/2023   Acute CHF (congestive heart failure) (HCC) 09/25/2023   Essential hypertension 09/25/2023   Anxiety and depression 09/25/2023   Dyslipidemia 09/25/2023   Acute bronchitis due to other specified organisms 09/05/2023   Mood disorder (HCC) 07/20/2023   Arthritis 07/20/2023   Annual physical exam 07/19/2023   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 07/19/2023   Positive screening for depression on 9-item Patient Health Questionnaire (PHQ-9) 07/19/2023   Establishing care with new doctor, encounter for 07/19/2023   Moderate tobacco dependence 07/19/2023   Encounter for screening for HIV 07/19/2023   Encounter for hepatitis C screening test for low risk patient 07/19/2023   Primary hypertension 07/19/2023   Screen for colon cancer 07/19/2023   Screening mammogram for breast cancer 07/19/2023   Screening for lung cancer 07/19/2023   BMI 32.0-32.9,adult 07/19/2023  Perimenopausal symptom 07/19/2023   PCP:  Ronnald Ramp, MD Pharmacy:   [facility] [address] [place] - [address] [place] [address] Phone: [phone] Fax: [phone]  [facility] [address] [place] [address] Phone: [phone] Fax: [phone]     Social Drivers of Health (SDOH) Social History: SDOH Screenings   Food Insecurity: No Food Insecurity (10/03/2023)  Housing: Low Risk  (10/03/2023)  Transportation Needs: No Transportation Needs (10/03/2023)  Utilities: Not At Risk (10/03/2023)  Depression (PHQ2-9): High Risk (09/25/2023)  Financial Resource Strain: Medium Risk (09/30/2023)  Social Connections: Moderately Integrated  (10/03/2023)  Tobacco Use: High Risk (10/04/2023)   SDOH Interventions:     Readmission Risk Interventions     No data to display

## 2023-10-04 NOTE — Plan of Care (Signed)
  Problem: Clinical Measurements: Goal: Respiratory complications will improve Outcome: Progressing Goal: Cardiovascular complication will be avoided Outcome: Progressing   Problem: Activity: Goal: Risk for activity intolerance will decrease Outcome: Progressing   Problem: Nutrition: Goal: Adequate nutrition will be maintained Outcome: Progressing   Problem: Coping: Goal: Level of anxiety will decrease Outcome: Progressing   Problem: Safety: Goal: Ability to remain free from injury will improve Outcome: Progressing   Problem: Clinical Measurements: Goal: Respiratory complications will improve Outcome: Progressing Goal: Cardiovascular complication will be avoided Outcome: Progressing   Problem: Safety: Goal: Ability to remain free from injury will improve Outcome: Progressing

## 2023-10-04 NOTE — H&P (View-Only) (Signed)
 [address]       [place] [address]             [id number]        Sandra Hines [facility] Medical Record #[id number] Date of Birth: [birth year]  Referring: Loyce Dys, MD Primary Care: Ronnald Ramp, MD Primary Cardiologist:Timothy Mariah Milling, MD  Chief Complaint: "Heart fluttering", palpitations(atrial fibrillation with RVR) Reason for consultation: Severe rheumatic mitral regurgitation  History of Present Illness:     This is a 53 year old female with a past medical history of essential hypertension, hyperlipidemia, tobacco abuse, cerebral aneurysm (repaired at [facility] in 2019) who presented to [facility] on 09/25/2023 with acute onset of palpitations, shortness of breath, generalized weakness and malaise, and cough (upper URI symptoms not improving). She was found to be in a hypertensive emergency (BP 211/154),and atrial fibrillation with RVR. EKG showed suspected atrial fibrillation with a rate of 184 with repolarization abnormality. Chest x ray showed mild cardiomegaly with mild central pulmonary vascular congestion. Cardiology was consulted.She was put on a Cardizem drip initially but due to lack of response, was then placed on Amiodarone drip. She was given Bisoprolol and Amlodipine for BP control. Lipid profile was done and she was restarted on Crestor. She did require bi pap briefly then HFNC. She was treated with Tessalon Perles antibiotics, steroids, and nebs for possible bronchitis/early PNA. She was diuresed with Lasix. 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  She was put on a Heparin drip for atrial fibrillation. Echocardiogram done  09/26/2023 showed LVEF 45-50%, severe mitral regurgitation (mean MV gradient is 11 mmHg), mitral valve is rheumatic, mild to moderate TR, no AI/AS. She converted to sinus rhythm and was transitioned to oral Amiodarone. Bisoprolol bid was continued. Cardiac catheterization was then done 10/03/2023 showed no significant atherosclerotic coronary artery disease. Large v waves on PCEP consistent with her severe mitral regurgitation. She was then transferred to [facility] for further evaluation and treatment on 10/03/2023. Cardiothoracic surgery (Dr. Leafy Ro) has been consulted for consideration of mitral valve repair/replacement. She remains in sinus rhythm on oral Amiodarone and Bisoprolol and is on Heparin drip. She states she is feeling better since original admission but still has chest "heaviness" this am.   Current Activity/ Functional Status: Patient is independent with mobility/ambulation, transfers, ADL's, IADL's.   Zubrod Score: At the time of surgery this patient's most appropriate activity status/level should be described as: []     0    Normal activity, no symptoms [x]     1    Restricted in physical strenuous activity but ambulatory, able to do  light work []     2    Ambulatory and capable of self care, unable to do work activities, up and about more than 50% of the time                            []     3    Only limited self care, in bed greater than 50% of waking hours []     4    Completely disabled, no self care, confined to bed or chair []     5    Moribund  Past Medical History:  Diagnosis Date   Hypertension     Past Surgical History:  Procedure Laterality Date   RIGHT/LEFT HEART CATH AND CORONARY ANGIOGRAPHY N/A 10/03/2023   Procedure: RIGHT/LEFT HEART CATH AND CORONARY ANGIOGRAPHY;  Surgeon: Yvonne Kendall, MD;  Location: [facility] INVASIVE CV LAB;  Service: Cardiovascular;  Laterality: N/A;    Social History   Tobacco Use  Smoking Status Every Day   Current packs/day: 1.00    Types: Cigarettes  Smokeless Tobacco Never    Social History   Substance and Sexual Activity  Alcohol Use No  She describes her alcohol use a couple of drinks per week (Brandy), depending on the day 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     Current Facility-Administered Medications  Medication Dose Route Frequency Provider Last Rate Last Admin   0.9 %  sodium chloride infusion  250 mL Intravenous PRN Rometta Emery, MD       acetaminophen (TYLENOL) tablet 650 mg  650 mg Oral Q4H PRN Rometta Emery, MD       acetaminophen-caffeine (EXCEDRIN TENSION HEADACHE) 500-65 MG per tablet 2 tablet  2 tablet Oral Q8H PRN Rometta Emery, MD       amiodarone (PACERONE) tablet 400 mg  400 mg Oral BID Earlie Lou L, MD   400 mg at 10/04/23 0839   busPIRone (BUSPAR) tablet 5 mg  5 mg Oral TID Rometta Emery, MD   5 mg at 10/04/23 0841   cyclobenzaprine (FLEXERIL) tablet 5 mg  5 mg Oral TID PRN Rometta Emery, MD       DULoxetine (CYMBALTA) DR capsule 40 mg  40 mg Oral Daily Earlie Lou L, MD   40 mg at 10/04/23 0839   furosemide (LASIX) injection 80 mg  80 mg Intravenous BID Earlie Lou L, MD   80 mg at 10/04/23 0845   heparin ADULT infusion 100 units/mL (25000 units/242mL)  1,900 Units/hr Intravenous Continuous Rometta Emery, MD 19 mL/hr at 10/04/23 0151 1,900 Units/hr at 10/04/23 0151   hydrALAZINE (APRESOLINE) injection 10 mg  10 mg Intravenous Q20 Min PRN Rometta Emery, MD       ipratropium-albuterol (DUONEB) 0.5-2.5 (3) MG/3ML nebulizer solution 3 mL  3 mL Nebulization Q2H PRN Rometta Emery, MD       labetalol (NORMODYNE) injection 10 mg  10 mg Intravenous Q10 min PRN Rometta Emery, MD       LORazepam (ATIVAN) tablet 1 mg  1 mg Oral Q6H PRN Rometta Emery, MD   1 mg at 10/04/23 0846   magnesium hydroxide (MILK OF MAGNESIA) suspension 30 mL  30 mL Oral Daily PRN Rometta Emery, MD       morphine (PF) 2 MG/ML injection 2 mg  2 mg Intravenous Q2H PRN Rometta Emery, MD       ondansetron (ZOFRAN) injection 4 mg  4 mg Intravenous Q6H PRN Rometta Emery, MD  Oral care mouth rinse  15 mL Mouth Rinse PRN Earlie Lou L, MD       potassium chloride SA (KLOR-CON M) CR tablet 40 mEq  40 mEq Oral Daily Earlie Lou L, MD   40 mEq at 10/04/23 0842   rosuvastatin (CRESTOR) tablet 40 mg  40 mg Oral Daily Rometta Emery, MD   40 mg at 10/04/23 9147   sacubitril-valsartan (ENTRESTO) 24-26 mg per tablet  1 tablet Oral BID Rometta Emery, MD   1 tablet at 10/04/23 0841   sodium chloride flush (NS) 0.9 % injection 3 mL  3 mL Intravenous Q12H Earlie Lou L, MD   3 mL at 10/03/23 2227   sodium chloride flush (NS) 0.9 % injection 3 mL  3 mL Intravenous PRN Rometta Emery, MD       spironolactone (ALDACTONE) tablet 12.5 mg  12.5 mg Oral Daily Earlie Lou L, MD   12.5 mg at 10/04/23 0841   traZODone (DESYREL) tablet 25-50 mg  25-50 mg Oral QHS PRN Rometta Emery, MD   50 mg at 10/03/23 2156    Medications Prior to Admission  Medication Sig Dispense Refill Last Dose/Taking   acetaminophen (TYLENOL) 325 MG tablet Take 2 tablets (650 mg total) by mouth every 4 (four) hours as needed for headache or mild pain (pain score 1-3).   Past Week   acetaminophen-caffeine (EXCEDRIN TENSION HEADACHE) 500-65 MG TABS per tablet Take 2 tablets by mouth every 8 (eight) hours as needed (headaches).   Past Week   amiodarone (PACERONE) 400 MG  tablet Take 1 tablet (400 mg total) by mouth 2 (two) times daily.   Past Week   buPROPion (WELLBUTRIN SR) 150 MG 12 hr tablet 1 tablet daily for 3 days, then 1 tablet twice daily. Stop smoking 14 days after starting medication 60 tablet 3 Past Week   busPIRone (BUSPAR) 5 MG tablet Take 1 tablet (5 mg total) by mouth 3 (three) times daily. 90 tablet 2 Past Week   cyclobenzaprine (FLEXERIL) 5 MG tablet Take 1 tablet (5 mg total) by mouth 3 (three) times daily as needed for muscle spasms.   Past Week   DULoxetine (CYMBALTA) 20 MG capsule Take 2 capsules (40 mg total) by mouth daily. 180 capsule 1 Past Week   heparin 82956 UT/250ML infusion Inject 1,900 Units/hr into the vein continuous.   10/03/2023 Morning   ipratropium-albuterol (DUONEB) 0.5-2.5 (3) MG/3ML SOLN Take 3 mLs by nebulization every 2 (two) hours as needed. (Patient taking differently: Take 3 mLs by nebulization every 2 (two) hours as needed (shortness of breath).)   Past Week   LORazepam (ATIVAN) 1 MG tablet Take 1 tablet (1 mg total) by mouth every 6 (six) hours as needed for anxiety.   Past Week   magnesium hydroxide (MILK OF MAGNESIA) 400 MG/5ML suspension Take 30 mLs by mouth daily as needed for mild constipation or moderate constipation.   Past Week   potassium chloride SA (KLOR-CON M) 20 MEQ tablet Take 2 tablets (40 mEq total) by mouth daily.   Past Week   rosuvastatin (CRESTOR) 40 MG tablet Take 1 tablet (40 mg total) by mouth daily. 90 tablet 3 Past Week   sacubitril-valsartan (ENTRESTO) 24-26 MG Take 1 tablet by mouth 2 (two) times daily.   Past Week   spironolactone (ALDACTONE) 25 MG tablet Take 0.5 tablets (12.5 mg total) by mouth daily.   Past Week   traZODone (DESYREL) 50 MG tablet Take 0.5-1 tablets (25-50  mg total) by mouth at bedtime as needed for sleep. 90 tablet 0 Past Week   furosemide (LASIX) 10 MG/ML injection Inject 8 mLs (80 mg total) into the vein 2 (two) times daily. (Patient not taking: Reported on 10/03/2023)   Not  Taking   hydrALAZINE (APRESOLINE) 20 MG/ML injection Inject 0.5 mLs (10 mg total) into the vein every 20 (twenty) minutes as needed (high blood pressure). (Patient not taking: Reported on 10/03/2023)   Not Taking   labetalol (NORMODYNE) 5 MG/ML injection Inject 2 mLs (10 mg total) into the vein every 10 (ten) minutes as needed. (Patient not taking: Reported on 10/03/2023)   Not Taking   Morphine Sulfate (MORPHINE, PF,) 2 MG/ML injection Inject 1 mL (2 mg total) into the vein every 2 (two) hours as needed. (Patient not taking: Reported on 10/03/2023)   Not Taking   ondansetron (ZOFRAN) 4 MG/2ML SOLN injection Inject 2 mLs (4 mg total) into the vein every 6 (six) hours as needed for nausea. (Patient not taking: Reported on 10/03/2023)   Not Taking   Family History: Mother-alive, age 55, has hypertension Father- decreased at age 51    Review of Systems:    Cardiac Review of Systems: Y or  [   N ]= no  Chest Pain [   Y ]  Exertional SOB  [ Y ]  Orthopnea [ Y ]   Pedal Edema [ N  ]    Palpitations [ Y ] Syncope  [N  ]   Presyncope [   ]  General Review of Systems: [Y] = yes [ N ]=no Constitional: fatigue [ Y ]; nausea [Y  ];  fever [N  ];                                                       Dental: Last Dentist visit: October 20,2023. She has upper and lower dentures  Resp: cough [ Y ];  wheezing[ N ];  hemoptysis[N  ];  GI:  vomiting[N  ];  melena[ N ];  hematochezia [ N ];  GU: hematuria[ N ];              Heme/Lymph:anemia[ Y ];  Neuro: stroke[N  ];   seizures[ N ];    Psych:depression[ Y ]; anxiety[Y  ];  Endocrine: diabetes[ N ];                 Physical Exam: BP 113/77 (BP Location: Left Arm)   Pulse 60   Temp 98.1 F (36.7 C) (Oral)   Resp 20   Ht 5\' 7"  (1.702 m)   Wt 90.9 kg   SpO2 96%   BMI 31.40 kg/m    General appearance: alert, cooperative, and no distress Head: Normocephalic, without obvious abnormality, atraumatic Neck: no carotid bruit and supple, symmetrical,  trachea midline Resp: clear to auscultation bilaterally Cardio: RRR, grade IV/VI murmur heard best at apex GI: Soft, mild obesity, non tender, bowel sounds present Extremities: No LE edema. Palpable DP/PT bilaterally Neurologic: Grossly normal  Diagnostic Studies & Laboratory data:     Recent Radiology Findings:   CARDIAC CATHETERIZATION Result Date: 10/03/2023 Conclusions: No angiographically significant atherosclerotic coronary artery disease.  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 Moderately elevated left heart, right heart, and pulmonary artery pressures.  Large v-waves  observed on PCWP tracing consistent with the patient's severe mitral regurgitation. Low normal to mildly reduced Fick cardiac output/index. Recommendations: Escalate diuresis and consider consultation with advanced heart failure team for optimization of acute diastolic heart failure in the setting of severe mitral valve regurgitation. Cardiac surgery consultation for mitral valve replacement.  Response to medical therapy will dictate if this can be done as an outpatient or if the patient will need transfer to [facility] for inpatient evaluation. Resume heparin infusion 2 hours after TR band removal. Primary prevention of coronary artery disease. Yvonne Kendall, MD [facility]  ECHO TEE Result Date: 10/03/2023    TRANSESOPHOGEAL ECHO REPORT   Patient Name:   Sandra Hines Date of Exam: 10/02/2023 Medical Rec #:  [id number]    Height:       67.0 in Accession #:    [id number]   Weight:       197.8 lb Date of Birth:  Aug 31, 1970   BSA:          2.013 m Patient Age:    52 years     BP:           121/88 mmHg Patient Gender: F            HR:           59 bpm. Exam Location:  [facility] Procedure: 3D Echo, Transesophageal Echo, Cardiac Doppler, Color Doppler and            Saline Contrast Bubble Study (Both Spectral and Color Flow Doppler            were utilized during procedure). Indications:     Severe Mitral valve regurgitation   History:         Patient has prior history of Echocardiogram examinations, most                  recent 10/01/2023. Risk Factors:Hypertension.  Sonographer:     Cristela Blue Referring Phys:  [id number] [facility] CAREY Diagnosing Phys: Julien Nordmann MD PROCEDURE: After discussion of the risks and benefits of a TEE, an informed consent was obtained from the patient. TEE procedure time was 30 minutes. The transesophogeal probe was passed without difficulty through the esophogus of the patient. Imaged were obtained with the patient in a left lateral decubitus position. Local oropharyngeal anesthetic was provided with Cetacaine and viscous lidocaine. Sedation performed by different physician. The patient was monitored while under deep sedation. Image quality was excellent. The patient's vital signs; including heart rate, blood pressure, and oxygen saturation; remained stable throughout the procedure. The patient developed no complications during the procedure.  IMPRESSIONS  1. Left ventricular ejection fraction, by estimation, is 55 to 60%. The left ventricle has normal function. The left ventricle has no regional wall motion abnormalities.  2. Right ventricular systolic function is low normal. The right ventricular size is normal.  3. The mitral valve is rheumatic, both anterior and posterior leaflets involved. Severe mitral valve regurgitation, central and eccentric jet noted. No evidence of mitral stenosis.  4. The aortic valve is normal in structure. Aortic valve regurgitation is not visualized. No aortic stenosis is present.  5. The inferior vena cava is normal in size with greater than 50% respiratory variability, suggesting right atrial pressure of 3 mmHg.  6. No left atrial/left atrial appendage thrombus was detected.  7. 3D performed of the mitral valve and demonstrates rheumatic mitral valve. Conclusion(s)/Recommendation(s): hemodynamically significant mitral valvular heart disease as detailed above. FINDINGS  Left  Ventricle: Left ventricular ejection fraction, by estimation, is 55 to 60%. The left ventricle has normal function. The left ventricle has no regional wall motion abnormalities. The left ventricular internal cavity size was normal in size. There is  no left ventricular hypertrophy. Right Ventricle: The right ventricular size is normal. No increase in right ventricular wall thickness. Right ventricular systolic function is low normal. Left Atrium: Left atrial size was normal in size. No left atrial/left atrial appendage thrombus was detected. Right Atrium: Right atrial size was normal in size. Pericardium: There is no evidence of pericardial effusion. Mitral Valve: The mitral valve is rheumatic. There is moderate thickening of the mitral valve leaflet(s). There is mild calcification of the mitral valve leaflet(s). Severe mitral valve regurgitation. No evidence of mitral valve stenosis. Tricuspid Valve: The tricuspid valve is normal in structure. Tricuspid valve regurgitation is not demonstrated. No evidence of tricuspid stenosis. Aortic Valve: The aortic valve is normal in structure. Aortic valve regurgitation is not visualized. No aortic stenosis is present. Pulmonic Valve: The pulmonic valve was normal in structure. Pulmonic valve regurgitation is not visualized. No evidence of pulmonic stenosis. Aorta: The aortic root is normal in size and structure. Venous: The inferior vena cava is normal in size with greater than 50% respiratory variability, suggesting right atrial pressure of 3 mmHg. IAS/Shunts: No atrial level shunt detected by color flow Doppler. Agitated saline contrast was given intravenously to evaluate for intracardiac shunting. Additional Comments: 3D was performed not requiring image post processing on an independent workstation and was abnormal. MR Peak grad:    116.2 mmHg MR Mean grad:    65.0 mmHg MR Vmax:         539.00 cm/s MR Vmean:        363.0 cm/s MR PISA:         3.08 cm MR PISA Eff ROA: 51  mm MR PISA Radius:  0.70 cm Julien Nordmann MD Electronically signed by Julien Nordmann MD Signature Date/Time: 10/02/2023/5:56:00 PM    Final (Updated)      I have independently reviewed the above radiologic studies and discussed with the patient   Recent Lab Findings: Lab Results  Component Value Date   WBC 10.4 10/04/2023   HGB 12.3 10/04/2023   HCT 39.0 10/04/2023   PLT 228 10/04/2023   GLUCOSE 112 (H) 10/04/2023   CHOL 175 09/26/2023   TRIG 79 09/26/2023   HDL 41 09/26/2023   LDLCALC 118 (H) 09/26/2023   ALT 81 (H) 09/30/2023   AST 29 09/30/2023   NA 135 10/04/2023   K 4.5 10/04/2023   CL 104 10/04/2023   CREATININE 1.07 (H) 10/04/2023   BUN 12 10/04/2023   CO2 23 10/04/2023   TSH 1.091 09/25/2023   INR 1.2 09/26/2023   HGBA1C 5.8 (H) 07/19/2023   Assessment / Plan:   Severe rheumatic mitral regurgitation-on Heparin drip. Dr. Leafy Ro to evaluate for MVR (likely on Monday) 2. Acute diastolic heart failure-on Lasix 80 mg IV bid, Entresto 24/26 bid and Spironolactone 12.5 mg daily 3. Recently diagnosed PAF-Likely MAZE while in OR. On Amiodarone 400 mg bid 4. History of tobacco abuse-encouraged cessation 5. 4.3 cm ascending thoracic aortic aneurysm on CTA-will need further yearly surveillance with CTA and follow up after discharge 6. Anxiety and depression-on Buspar and Cymbalta  I  spent 20 minutes counseling the patient face to face.   Doree Fudge PA-C 10/04/2023 9:13 AM  Agree with above 53 yo female with no previous rheuamtic fever history presented  with HTN and rapid afib and CHF. Work up reveals EF 55%, severe rheumatic mitral regurgitation and moderate ascending aortic aneurysm. Pt has been cathed without CAD has moderate PHTN. Currently having AHF manage diuresis. I feel she needs to have her mitral valve replaced and at her age will use mechanical prosthesis. Will do pulm vein isolation maze and consider replacing ascending aorta since it measures just under  4.5 cm on CT scan. Pt understands all the risks and goals and recovery from surgery and she wishes to proceed

## 2023-10-04 NOTE — Progress Notes (Signed)
 PROGRESS NOTE    Sandra Hines  OZH:[id number] DOB: Jun 07, 1971 DOA: 10/03/2023 PCP: Ronnald Ramp, MD  52/F with history of hypertension, cerebral aneurysm sp repair presented to the ED with palpitations, generalized weakness and malaise, in the ED she was noted to be in A-fib RVR and chest x-ray noted CHF -3/6 placed on BiPAP and high flow, started on antibiotics, diuretics and diltiazem, echo noted EF 45-50%, severe MR -3/7 started on Amio -3/8 diuretics continued BiPAP overnight -3/10 switch to oral amiodarone, repeat limited echo -3/12 TEE, EF 55%, severe mitral regurgitation with rheumatic appearing leaflets -3/13 right and left heart cath, no significant CAD, RA 10, RV 63/10, PA 63/28, wedge 20, LVEDP 25, seen by heart failure team and transferred to [facility] for mitral valve surgery   Subjective: Breathing better overall, anxious about surgery  Assessment and Plan:  Severe mitral regurgitation  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   Acute bronchitis Question CAP No formal COPD dx but possible this is contributing given tobacco hx  -Suspect symptoms were all secondary to CHF, completed a course of steroids and antibiotics at [facility]   Essential hypertension -Stable, meds as above   Dyslipidemia -Continue statin therapy   Anxiety and depression -Continue Wellbutrin SR, BuSpar, Cymbalta.     Class 1 obesity based on BMI: Body mass index is 33.04 kg/m.  Counseled on weight loss when medically stable    DVT prophylaxis: heparin gtt   Code Status: FULL CODE Family Communication: None present Disposition Plan:   Consultants:    Procedures:   Antimicrobials:    Objective: Vitals:   10/03/23 2025 10/04/23 0411 10/04/23 0500 10/04/23 0819  BP:  (!) 120/95  113/77  Pulse:    60  Resp: 16 18  20   Temp:  97.8 F (36.6 C)  98.1 F (36.7 C)  TempSrc:  Oral  Oral  SpO2:  96%    Weight:   90.9 kg   Height:        Intake/Output Summary (Last 24 hours) at 10/04/2023 1135 Last data filed at 10/04/2023 6295 Gross per 24 hour  Intake 318.29 ml  Output 780 ml  Net -461.71 ml   Filed Weights   10/03/23 1858 10/04/23 0500  Weight: 91.6 kg 90.9 kg    Examination:  General exam: Appears calm and comfortable, AAOx3 HEENT: Positive JVD CVS: S1-S2, regular rhythm, systolic murmur Lungs: Clear bilaterally Abdomen: Soft, nontender, bowel sounds present Extremities: No edema Skin: No rashes Psychiatry:  Mood & affect appropriate.  Anxious    Data Reviewed:   CBC: Recent Labs  Lab 09/30/23 0007 10/01/23 0402 10/01/23 1017 10/03/23 0447 10/03/23 0810 10/03/23 0813 10/04/23 0556  WBC 12.3* 10.3 10.5 9.8  --   --  10.4  NEUTROABS  --   --   --  5.6  --   --   --   HGB 11.2* 11.4* 11.6* 11.6* 11.9* 12.2 12.3  HCT 34.1* 34.8* 36.2 35.3* 35.0* 36.0 39.0  MCV 85.3 85.5 85.4 85.3  --   --  86.7  PLT 145* 166 196 206  --   --  228   Basic Metabolic Panel: Recent Labs  Lab 09/30/23 0007 10/01/23 1017 10/02/23 0442 10/03/23 0447 10/03/23 0810 10/03/23 0813 10/04/23 0556  NA 136 139 139 137 138 141 135  K 3.7 3.1* 3.6 4.2 4.2 4.3 4.5  CL 98 100 103 105  --   --  104  CO2 29 29 27 22   --   --  23  GLUCOSE 157* 116* 100* 151*  --   --  112*  BUN 18 20 21* 19  --   --  12  CREATININE 0.82 0.96 0.91 0.85  --   --  1.07*  CALCIUM 8.3* 8.9 8.7* 8.6*  --   --  8.9  PHOS  --   --  3.5 3.4  --   --   --    GFR: Estimated Creatinine Clearance: 72.8 mL/min (A) (by C-G  formula based on SCr of 1.07 mg/dL (H)). Liver Function Tests: Recent Labs  Lab 09/30/23 0007 10/02/23 0442 10/03/23 0447  AST 29  --   --   ALT 81*  --   --   ALKPHOS 116  --   --   BILITOT 0.6  --   --   PROT 6.9  --   --   ALBUMIN 3.0* 2.9* 2.9*   No results for input(s): "LIPASE", "AMYLASE" in the last 168 hours. No results for input(s): "AMMONIA" in the last 168 hours. Coagulation Profile: No results for input(s): "INR", "PROTIME" in the last 168 hours. Cardiac Enzymes: No results for input(s): "CKTOTAL", "CKMB", "CKMBINDEX", "TROPONINI" in the last 168 hours. BNP (last 3 results) No results for input(s): "PROBNP" in the last 8760 hours. HbA1C: No results for input(s): "HGBA1C" in the last 72 hours. CBG: No results for input(s): "GLUCAP" in the last 168 hours. Lipid Profile: No results for input(s): "CHOL", "HDL", "LDLCALC", "TRIG", "CHOLHDL", "LDLDIRECT" in the last 72 hours. Thyroid Function Tests: No results for input(s): "TSH", "T4TOTAL", "FREET4", "T3FREE", "THYROIDAB" in the last 72 hours. Anemia Panel: No results for input(s): "VITAMINB12", "FOLATE", "FERRITIN", "TIBC", "IRON", "RETICCTPCT" in the last 72 hours. Urine analysis:    Component Value Date/Time   COLORURINE YELLOW (A) 11/26/2020 0041   APPEARANCEUR CLEAR (A) 11/26/2020 0041   APPEARANCEUR Cloudy 08/15/2014 0519   LABSPEC 1.026 11/26/2020 0041   LABSPEC 1.017 08/15/2014 0519   PHURINE 5.0 11/26/2020 0041   GLUCOSEU NEGATIVE 11/26/2020 0041   GLUCOSEU Negative 08/15/2014 0519   HGBUR NEGATIVE 11/26/2020 0041   BILIRUBINUR NEGATIVE 11/26/2020 0041   BILIRUBINUR Negative 08/15/2014 0519   KETONESUR NEGATIVE 11/26/2020 0041   PROTEINUR NEGATIVE 11/26/2020 0041   NITRITE NEGATIVE 11/26/2020 0041   LEUKOCYTESUR NEGATIVE 11/26/2020 0041   LEUKOCYTESUR Negative 08/15/2014 0519   Sepsis Labs: @LABRCNTIP (procalcitonin:4,lacticidven:4)  ) Recent Results (from the past 240 hours)  Resp panel by  RT-PCR (RSV, Flu A&B, Covid) Anterior Nasal Swab     Status: None   Collection Time: 09/25/23  5:22 PM   Specimen: Anterior Nasal Swab  Result Value Ref Range Status   SARS Coronavirus 2 by RT PCR NEGATIVE NEGATIVE Final    Comment: (NOTE) SARS-CoV-2 target nucleic acids are NOT DETECTED.  The SARS-CoV-2 RNA is generally detectable in upper respiratory specimens during the acute phase of infection. The lowest concentration of SARS-CoV-2 viral copies this assay can detect is 138 copies/mL. A negative result does not preclude SARS-Cov-2 infection and should not be used as  the sole basis for treatment or other patient management decisions. A negative result may occur with  improper specimen collection/handling, submission of specimen other than nasopharyngeal swab, presence of viral mutation(s) within the areas targeted by this assay, and inadequate number of viral copies(<138 copies/mL). A negative result must be combined with clinical observations, patient history, and epidemiological information. The expected result is Negative.  Fact Sheet for Patients:  BloggerCourse.com  Fact Sheet for Healthcare Providers:  SeriousBroker.it  This test is no t yet approved or cleared by the [place] FDA and  has been authorized for detection and/or diagnosis of SARS-CoV-2 by FDA under an Emergency Use Authorization (EUA). This EUA will remain  in effect (meaning this test can be used) for the duration of the COVID-19 declaration under Section 564(b)(1) of the Act, 21 U.S.C.section 360bbb-3(b)(1), unless the authorization is terminated  or revoked sooner.       Influenza A by PCR NEGATIVE NEGATIVE Final   Influenza B by PCR NEGATIVE NEGATIVE Final    Comment: (NOTE) The Xpert Xpress SARS-CoV-2/FLU/RSV plus assay is intended as an aid in the diagnosis of influenza from Nasopharyngeal swab specimens and should not be used as a sole basis  for treatment. Nasal washings and aspirates are unacceptable for Xpert Xpress SARS-CoV-2/FLU/RSV testing.  Fact Sheet for Patients: BloggerCourse.com  Fact Sheet for Healthcare Providers: SeriousBroker.it  This test is not yet approved or cleared by the [place] FDA and has been authorized for detection and/or diagnosis of SARS-CoV-2 by FDA under an Emergency Use Authorization (EUA). This EUA will remain in effect (meaning this test can be used) for the duration of the COVID-19 declaration under Section 564(b)(1) of the Act, 21 U.S.C. section 360bbb-3(b)(1), unless the authorization is terminated or revoked.     Resp Syncytial Virus by PCR NEGATIVE NEGATIVE Final    Comment: (NOTE) Fact Sheet for Patients: BloggerCourse.com  Fact Sheet for Healthcare Providers: SeriousBroker.it  This test is not yet approved or cleared by the [place] FDA and has been authorized for detection and/or diagnosis of SARS-CoV-2 by FDA under an Emergency Use Authorization (EUA). This EUA will remain in effect (meaning this test can be used) for the duration of the COVID-19 declaration under Section 564(b)(1) of the Act, 21 U.S.C. section 360bbb-3(b)(1), unless the authorization is terminated or revoked.  Performed at [facility], [address] [place], [place] [address]   MRSA Next Gen by PCR, Nasal     Status: None   Collection Time: 09/26/23  1:39 AM   Specimen: Nasal Mucosa; Nasal Swab  Result Value Ref Range Status   MRSA by PCR Next Gen NOT DETECTED NOT DETECTED Final    Comment: (NOTE) The GeneXpert MRSA Assay (FDA approved for NASAL specimens only), is one component of a comprehensive MRSA colonization surveillance program. It is not intended to diagnose MRSA infection nor to guide or monitor treatment for MRSA infections. Test performance is not FDA approved in  patients less than 51 years old. Performed at [facility], [address] [place], [place] [address]   Culture, blood (Routine X 2) w Reflex to ID Panel     Status: None   Collection Time: 09/26/23  3:31 AM   Specimen: BLOOD  Result Value Ref Range Status   Specimen Description BLOOD BLOOD LEFT HAND  Final   Special Requests   Final    BOTTLES DRAWN AEROBIC AND ANAEROBIC Blood Culture adequate volume   Culture   Final  NO GROWTH 5 DAYS Performed at [facility], [address] [place], [place] [address]    Report Status 10/01/2023 FINAL  Final  Culture, blood (Routine X 2) w Reflex to ID Panel     Status: None   Collection Time: 09/26/23  3:40 AM   Specimen: BLOOD  Result Value Ref Range Status   Specimen Description BLOOD BLOOD RIGHT HAND  Final   Special Requests   Final    BOTTLES DRAWN AEROBIC AND ANAEROBIC Blood Culture adequate volume   Culture   Final    NO GROWTH 5 DAYS Performed at [facility], [address], [place], [place] [address]    Report Status 10/01/2023 FINAL  Final     Radiology Studies: CARDIAC CATHETERIZATION Result Date: 10/03/2023 Conclusions: No angiographically significant atherosclerotic coronary artery disease.  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 Moderately elevated left heart, right heart, and pulmonary artery pressures.  Large v-waves observed on PCWP tracing consistent with the patient's severe mitral regurgitation. Low normal to mildly reduced Fick cardiac output/index. Recommendations: Escalate diuresis and consider consultation with advanced heart failure team for optimization of acute diastolic heart failure in the setting of severe mitral valve regurgitation. Cardiac surgery consultation for mitral valve replacement.  Response to medical therapy will dictate if this can be done as an outpatient or if the patient will need transfer to [facility] for inpatient evaluation. Resume heparin infusion 2  hours after TR band removal. Primary prevention of coronary artery disease. Yvonne Kendall, MD [facility]  ECHO TEE Result Date: 10/03/2023    TRANSESOPHOGEAL ECHO REPORT   Patient Name:   Sandra Hines Date of Exam: 10/02/2023 Medical Rec #:  [id number]    Height:       67.0 in Accession #:    [id number]   Weight:       197.8 lb Date of Birth:  Apr 08, 1971   BSA:          2.013 m Patient Age:    52 years     BP:           121/88 mmHg Patient Gender: F            HR:           59 bpm. Exam Location:  [facility] Procedure: 3D Echo, Transesophageal Echo, Cardiac Doppler, Color Doppler and            Saline Contrast Bubble Study (Both Spectral and Color Flow Doppler            were utilized during procedure). Indications:     Severe Mitral valve regurgitation  History:         Patient has prior history of Echocardiogram examinations, most                  recent 10/01/2023. Risk Factors:Hypertension.  Sonographer:     Cristela Blue Referring Phys:  [id number] [facility] L CAREY Diagnosing Phys: Julien Nordmann MD PROCEDURE: After discussion of the risks and benefits of a TEE, an informed consent was obtained from the patient. TEE procedure time was 30 minutes. The transesophogeal probe was passed without difficulty through the esophogus of the patient. Imaged were obtained with the patient in a left lateral decubitus position. Local oropharyngeal anesthetic was provided with Cetacaine and viscous lidocaine. Sedation performed by different physician. The patient was monitored while under deep sedation. Image quality was excellent. The patient's vital signs; including heart rate, blood pressure, and oxygen  saturation; remained stable throughout the procedure. The patient developed no complications during the procedure.  IMPRESSIONS  1. Left ventricular ejection fraction, by estimation, is 55 to 60%. The left ventricle has normal function. The left ventricle has no regional wall motion abnormalities.  2. Right ventricular systolic  function is low normal. The right ventricular size is normal.  3. The mitral valve is rheumatic, both anterior and posterior leaflets involved. Severe mitral valve regurgitation, central and eccentric jet noted. No evidence of mitral stenosis.  4. The aortic valve is normal in structure. Aortic valve regurgitation is not visualized. No aortic stenosis is present.  5. The inferior vena cava is normal in size with greater than 50% respiratory variability, suggesting right atrial pressure of 3 mmHg.  6. No left atrial/left atrial appendage thrombus was detected.  7. 3D performed of the mitral valve and demonstrates rheumatic mitral valve. Conclusion(s)/Recommendation(s): hemodynamically significant mitral valvular heart disease as detailed above. FINDINGS  Left Ventricle: Left ventricular ejection fraction, by estimation, is 55 to 60%. The left ventricle has normal function. The left ventricle has no regional wall motion abnormalities. The left ventricular internal cavity size was normal in size. There is  no left ventricular hypertrophy. Right Ventricle: The right ventricular size is normal. No increase in right ventricular wall thickness. Right ventricular systolic function is low normal. Left Atrium: Left atrial size was normal in size. No left atrial/left atrial appendage thrombus was detected. Right Atrium: Right atrial size was normal in size. Pericardium: There is no evidence of pericardial effusion. Mitral Valve: The mitral valve is rheumatic. There is moderate thickening of the mitral valve leaflet(s). There is mild calcification of the mitral valve leaflet(s). Severe mitral valve regurgitation. No evidence of mitral valve stenosis. Tricuspid Valve: The tricuspid valve is normal in structure. Tricuspid valve regurgitation is not demonstrated. No evidence of tricuspid stenosis. Aortic Valve: The aortic valve is normal in structure. Aortic valve regurgitation is not visualized. No aortic stenosis is present.  Pulmonic Valve: The pulmonic valve was normal in structure. Pulmonic valve regurgitation is not visualized. No evidence of pulmonic stenosis. Aorta: The aortic root is normal in size and structure. Venous: The inferior vena cava is normal in size with greater than 50% respiratory variability, suggesting right atrial pressure of 3 mmHg. IAS/Shunts: No atrial level shunt detected by color flow Doppler. Agitated saline contrast was given intravenously to evaluate for intracardiac shunting. Additional Comments: 3D was performed not requiring image post processing on an independent workstation and was abnormal. MR Peak grad:    116.2 mmHg MR Mean grad:    65.0 mmHg MR Vmax:         539.00 cm/s MR Vmean:        363.0 cm/s MR PISA:         3.08 cm MR PISA Eff ROA: 51 mm MR PISA Radius:  0.70 cm Julien Nordmann MD Electronically signed by Julien Nordmann MD Signature Date/Time: 10/02/2023/5:56:00 PM    Final (Updated)      Scheduled Meds:  amiodarone  400 mg Oral BID   busPIRone  5 mg Oral TID   DULoxetine  40 mg Oral Daily   furosemide  80 mg Intravenous BID   potassium chloride SA  40 mEq Oral Daily   rosuvastatin  40 mg Oral Daily   sacubitril-valsartan  1 tablet Oral BID   sodium chloride flush  3 mL Intravenous Q12H   spironolactone  12.5 mg Oral Daily   Continuous Infusions:  sodium chloride  heparin 1,900 Units/hr (10/04/23 0151)     LOS: 1 day    Time spent:    Zannie Cove, MD [facility]   10/04/2023, 11:35 AM

## 2023-10-04 NOTE — Progress Notes (Signed)
 RT NOTE:  RT completed bedside PFT per Dr. Leafy Ro orders. Good pt effort.

## 2023-10-04 NOTE — Progress Notes (Signed)
 Heart Failure Navigator Progress Note  Assessed for Heart & Vascular TOC clinic readiness.  Patient does not meet criteria due to Advanced Heart Failure Team consulted. .   Navigator will sign off at this time.   Rhae Hammock, BSN, Scientist, clinical (histocompatibility and immunogenetics) Only

## 2023-10-04 NOTE — Hospital Course (Addendum)
 HPI: This is a 53 year old female with a past medical history of essential hypertension, hyperlipidemia, tobacco abuse, cerebral aneurysm (repaired at [facility] in 2019) who presented to [facility] on 09/25/2023 with acute onset of palpitations, shortness of breath, generalized weakness and malaise, and cough (upper URI symptoms not improving). She was found to be in a hypertensive emergency (BP 211/154),and atrial fibrillation with RVR. EKG showed suspected atrial fibrillation with a rate of 184 with repolarization abnormality. Chest x ray showed mild cardiomegaly with mild central pulmonary vascular congestion. Cardiology was consulted.She was put on a Cardizem drip initially but due to lack of response, was then placed on Amiodarone drip. She was given Bisoprolol and Amlodipine for BP control. Lipid profile was done and she was restarted on Crestor. She did require bi pap briefly then HFNC. She was treated with Tessalon Perles antibiotics, steroids, and nebs for possible bronchitis/early PNA. She was diuresed with Lasix. 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  She was put on a Heparin drip for atrial fibrillation. Echocardiogram done 09/26/2023 showed LVEF 45-50%, severe mitral regurgitation (mean MV gradient is 11 mmHg), mitral valve is rheumatic, mild to moderate TR, no AI/AS. She converted to sinus rhythm and was transitioned to oral Amiodarone. Bisoprolol bid was continued. Cardiac catheterization was then done 10/03/2023 showed no significant atherosclerotic coronary artery disease. Large v waves on PCEP consistent with her severe mitral regurgitation. She was then transferred to [facility] for further  evaluation and treatment on 10/03/2023. Cardiothoracic surgery (Dr. Leafy Ro) has been consulted for consideration of mitral valve repair/replacement. Dr. Leafy Ro discussed the need for median sternotomy for mitral valve replacement (using a mechanical mitral valve), PVI Maze, and consideration of replacement of ascending thoracic aorta (ATAA).  Hospital Course: Patient underwent a median sternotomy for mitral valve replacement (mechanical) and PVI MAZE. The aortic valve dilation was not severe enough to warrant repair. Following the procedure he separated from cardiopulmonary bypass without difficulty and was transferred to the ICU in stable condition. She was extubated early the afternoon of surgery. SH was initially AAI paced (underlying sinus with bigeminy).  She was weaned off Neo Synephrine drip. Theone Murdoch and a line were removed on post op day one. Chest tubes and foley were removed on post op day 2. She had a fib with RVR on admission. She was not initially given Amiodarone or Lopressor  post op because of sinus arrest/atrial bigeminy. She has acute on chronic heart failure. Advanced heart failure followed her post op and gave her Lasix IV and Bidil 1/2 tablet tid 03/18. Potassium was up to 5.6 03/19 so she was given [facility] and recheck of BMET in the afternoon. She was started on low dose Coumadin. PT/INR were monitored daily. She was not started on Lovenox as she had thrombocytopenia post op. Platelets went as low as 71,000 but over the next several day increased. She went into a fib with CVR so she was started on oral Amiodarone 200 mg bid. EP was asked to evaluate as she had paroxysms of atrial fibrillation with bradycardia when she converts. She had moderate hyponatremia post op. Sodium went as low as 129. She was started on IV Ceftriaxone for possible bronchitis (leukocytosis and cough). She was felt stable  for transfer from the ICU to 4E for further convalescence on 03/20.  She has continued to  make ongoing improvements in her physical recovery.

## 2023-10-04 NOTE — Discharge Instructions (Addendum)
 Discharge Instructions:  1. You may shower, please wash incisions daily with soap and water and keep dry.  If you wish to cover wounds with dressing you may do so but please keep clean and change daily.  No tub baths or swimming until incisions have completely healed.  If your incisions become red or develop any drainage please call our office at [phone]  2. No Driving until cleared by Dr. Karolee Ohs office and you are no longer using narcotic pain medications  3. Monitor your weight daily.. Please use the same scale and weigh at same time... If you gain 5-10 lbs in 48 hours with associated lower extremity swelling, please contact our office at [phone]  4. Fever of 101.5 for at least 24 hours with no source, please contact our office at [phone]  5. Activity- up as tolerated, please walk at least 3 times per day.  Avoid strenuous activity, no lifting, pushing, or pulling with your arms over 8-10 lbs for a minimum of 6 weeks  6. If any questions or concerns arise, please do not hesitate to contact our office at [phone]  Vitamin K Foods and Warfarin Warfarin is a blood thinner (anticoagulant). Anticoagulant medicines help prevent blood clots from forming or getting bigger. Warfarin works by blocking the activity of vitamin K. Vitamin K promotes normal blood clotting. When you take warfarin, problems can occur from suddenly increasing or decreasing the amount of vitamin K that you eat from one day to the next. These problems can occur due to varying levels of warfarin in your blood. Problems may include blood clots or bleeding. What are tips for eating the right amount of vitamin K? Reading food labels Know which foods contain vitamin K. Read food labels. Use the lists below to understand serving sizes and the amount of vitamin K in one serving. If you take a multivitamin that contains vitamin K, be sure to take it every day. Meal planning To avoid problems when taking  warfarin: Eat a balanced diet that includes: Fresh fruits and vegetables. Whole grains. Low-fat dairy products. Lean proteins, such as fish, eggs, and lean cuts of meat. Avoid major changes in your diet. If you are going to change your diet, talk with your health care provider before making changes. Keep your intake of vitamin K consistent from day to day. Avoid eating large amounts of vitamin K one day and small amounts of vitamin K the next day. Work with a dietitian to develop a meal plan that works best for you.  What foods are high in vitamin K? Foods that are high in vitamin K contain more than 100 mcg (micrograms) per serving. These include: Broccoli (cooked from fresh) -  cup (78 g) has 110 mcg. Brussels sprouts (cooked from fresh) -  cup (78 g) has 109 mcg. Greens, beet (cooked from fresh) -  cup (72 g) has 350 mcg. Greens, collard (cooked from fresh) -  cup (66 g) has 263 mcg. Greens, turnip (cooked from fresh) -  cup (72 g) has 265 mcg. Green onions or scallions -  cup (50 g) has 105 mcg. Kale (cooked from fresh) -  cup (68 g) has 536 mcg. Parsley (raw) - 10 sprigs (10 g) has 164 mcg. Spinach (cooked from fresh) -  cup (90 g) has 444 mcg. Swiss chard (cooked from fresh) -  cup (88 g) has 287 mcg. The items listed above may not be a complete list of foods high in Vitamin K. Actual amounts of Vitamin  K may differ depending on processing. Contact a dietitian for more information. What foods have a moderate amount of vitamin K? Foods that have a moderate amount of vitamin K contain 25-100 mcg per serving. These include: Asparagus (cooked from fresh) - 4 spears (60 g) have 30 mcg. Black-eyed peas (dried) -  cup (85 g) has 32 mcg. Cabbage (cooked from fresh) -  cup (78 g) has 84 mcg. Cabbage (raw) -  cup (35 g) has 26 mcg. Kiwi fruit - 1 medium (69 g) has 27 mcg. Lettuce (raw) - 1 cup (36 g) has 45 mcg. Okra (cooked from fresh) -  cup (80 g) has 32 mcg. Prunes  (dried) - 5 prunes (47 g) have 25 mcg. Tuna, light, canned in oil - 3 oz (85 g) has 37 mcg. Watercress (raw) - 1 cup (34 g) has 85 mcg. The items listed above may not be a complete list of foods with a moderate amount of Vitamin K. Actual amounts of Vitamin K may differ depending on processing. Contact a dietitian for more information. What foods are low in vitamin K? Foods low in vitamin K contain less than 25 mcg per serving. These include: Artichoke - 1 medium (128 g) has 18 mcg. Avocado - 1 oz (21 g) has 6 mcg. Blueberries -  cup (73 g) has 14 mcg. Carrots (cooked from fresh) -  cup (78 g) has 11 mcg. Cauliflower (raw) -  cup (54 g) has 8 mcg. Cucumber with peel (raw) -  cup (52 g) has 9 mcg. Grapes -  cup (76 g) has 12 mcg. Mango - 1 medium (207 g) has 9 mcg. Mixed nuts - 1 cup (142 g) has 17 mcg. Pear - 1 medium (178 g) has 8 mcg. Peas (cooked from fresh) -  cup (80 g) has 20 mcg. Pickled cucumber - 1 spear (65 g) has 11 mcg. Sauerkraut (canned) -  cup (118 g) has 16 mcg. Soybeans (cooked from fresh) -  cup (86 g) has 16 mcg. Tomato (raw) - 1 medium (123 g) has 10 mcg. Tomato sauce (raw) -  cup (123 g) has 17 mcg. The items listed above may not be a complete list of foods low in Vitamin K. Actual amounts of Vitamin K may differ depending on processing. Contact a dietitian for more information. What foods do not have vitamin K? If a food contains less than 5 mcg per serving, it is considered to have no vitamin K. These foods include: Bread and cereal products. Cheese. Eggs. Fish and shellfish. Meat and poultry. Milk and dairy products. Seeds, such as sunflower or pumpkin seeds. The items listed above may not be a complete list of foods that do not have vitamin K. Actual amounts of vitamin K may differ depending on processing. Contact a dietitian for more information. Summary Warfarin is an anticoagulant that prevents blood clots from forming or getting bigger by  blocking the activity of vitamin K. It is important to know the amount of vitamin K that is in the foods you eat and to keep your intake of vitamin K consistent from day to day. Avoid major changes in your diet. If you are going to change your diet, talk with your health care provider before making changes. This information is not intended to replace advice given to you by your health care provider. Make sure you discuss any questions you have with your health care provider. Document Revised: 09/14/2020 Document Reviewed: 09/14/2020 [employer] Patient Education  2024 [employer]

## 2023-10-04 NOTE — Progress Notes (Signed)
 Heart Failure Navigator Progress Note  Patient was initially scheduled for a Heart & Vascular TOC appointment @ [facility].  Patient was transferred to [facility] in [place] and picked up by the Advanced Heart Failure Team. [facility] appointment cancelled at this time.  The Heart Failure Team will schedule her hospital  follow-up appointment with them prior to discharge.   Navigator will sign off at this time.  Roxy Horseman, RN, BSN [facility] Heart Failure Navigator Secure Chat Only

## 2023-10-04 NOTE — Progress Notes (Signed)
 CARDIAC REHAB PHASE I      Pre-op OHS education including OHS booklet, OHS handout, IS use, mobility importance, home needs at discharge and sternal precautions/move in the tube reviewed. All questions and concerns addressed. Will continue to follow.   Woodroe Chen, RN BSN 10/04/2023 12:08 PM

## 2023-10-04 NOTE — Consult Note (Addendum)
 [address]       [place] [address]             [id number]        Sandra Hines [facility] Medical Record #[id number] Date of Birth: [birth year]  Referring: Loyce Dys, MD Primary Care: Ronnald Ramp, MD Primary Cardiologist:Timothy Mariah Milling, MD  Chief Complaint: "Heart fluttering", palpitations(atrial fibrillation with RVR) Reason for consultation: Severe rheumatic mitral regurgitation  History of Present Illness:     This is a 53 year old female with a past medical history of essential hypertension, hyperlipidemia, tobacco abuse, cerebral aneurysm (repaired at [facility] in 2019) who presented to [facility] on 09/25/2023 with acute onset of palpitations, shortness of breath, generalized weakness and malaise, and cough (upper URI symptoms not improving). She was found to be in a hypertensive emergency (BP 211/154),and atrial fibrillation with RVR. EKG showed suspected atrial fibrillation with a rate of 184 with repolarization abnormality. Chest x ray showed mild cardiomegaly with mild central pulmonary vascular congestion. Cardiology was consulted.She was put on a Cardizem drip initially but due to lack of response, was then placed on Amiodarone drip. She was given Bisoprolol and Amlodipine for BP control. Lipid profile was done and she was restarted on Crestor. She did require bi pap briefly then HFNC. She was treated with Tessalon Perles antibiotics, steroids, and nebs for possible bronchitis/early PNA. She was diuresed with Lasix. 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  She was put on a Heparin drip for atrial fibrillation. Echocardiogram done  09/26/2023 showed LVEF 45-50%, severe mitral regurgitation (mean MV gradient is 11 mmHg), mitral valve is rheumatic, mild to moderate TR, no AI/AS. She converted to sinus rhythm and was transitioned to oral Amiodarone. Bisoprolol bid was continued. Cardiac catheterization was then done 10/03/2023 showed no significant atherosclerotic coronary artery disease. Large v waves on PCEP consistent with her severe mitral regurgitation. She was then transferred to [facility] for further evaluation and treatment on 10/03/2023. Cardiothoracic surgery (Dr. Leafy Ro) has been consulted for consideration of mitral valve repair/replacement. She remains in sinus rhythm on oral Amiodarone and Bisoprolol and is on Heparin drip. She states she is feeling better since original admission but still has chest "heaviness" this am.   Current Activity/ Functional Status: Patient is independent with mobility/ambulation, transfers, ADL's, IADL's.   Zubrod Score: At the time of surgery this patient's most appropriate activity status/level should be described as: []     0    Normal activity, no symptoms [x]     1    Restricted in physical strenuous activity but ambulatory, able to do  light work []     2    Ambulatory and capable of self care, unable to do work activities, up and about more than 50% of the time                            []     3    Only limited self care, in bed greater than 50% of waking hours []     4    Completely disabled, no self care, confined to bed or chair []     5    Moribund  Past Medical History:  Diagnosis Date   Hypertension     Past Surgical History:  Procedure Laterality Date   RIGHT/LEFT HEART CATH AND CORONARY ANGIOGRAPHY N/A 10/03/2023   Procedure: RIGHT/LEFT HEART CATH AND CORONARY ANGIOGRAPHY;  Surgeon: Yvonne Kendall, MD;  Location: [facility] INVASIVE CV LAB;  Service: Cardiovascular;  Laterality: N/A;    Social History   Tobacco Use  Smoking Status Every Day   Current packs/day: 1.00    Types: Cigarettes  Smokeless Tobacco Never    Social History   Substance and Sexual Activity  Alcohol Use No  She describes her alcohol use a couple of drinks per week (Brandy), depending on the day 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     Current Facility-Administered Medications  Medication Dose Route Frequency Provider Last Rate Last Admin   0.9 %  sodium chloride infusion  250 mL Intravenous PRN Rometta Emery, MD       acetaminophen (TYLENOL) tablet 650 mg  650 mg Oral Q4H PRN Rometta Emery, MD       acetaminophen-caffeine (EXCEDRIN TENSION HEADACHE) 500-65 MG per tablet 2 tablet  2 tablet Oral Q8H PRN Rometta Emery, MD       amiodarone (PACERONE) tablet 400 mg  400 mg Oral BID Earlie Lou L, MD   400 mg at 10/04/23 0839   busPIRone (BUSPAR) tablet 5 mg  5 mg Oral TID Rometta Emery, MD   5 mg at 10/04/23 0841   cyclobenzaprine (FLEXERIL) tablet 5 mg  5 mg Oral TID PRN Rometta Emery, MD       DULoxetine (CYMBALTA) DR capsule 40 mg  40 mg Oral Daily Earlie Lou L, MD   40 mg at 10/04/23 0839   furosemide (LASIX) injection 80 mg  80 mg Intravenous BID Earlie Lou L, MD   80 mg at 10/04/23 0845   heparin ADULT infusion 100 units/mL (25000 units/242mL)  1,900 Units/hr Intravenous Continuous Rometta Emery, MD 19 mL/hr at 10/04/23 0151 1,900 Units/hr at 10/04/23 0151   hydrALAZINE (APRESOLINE) injection 10 mg  10 mg Intravenous Q20 Min PRN Rometta Emery, MD       ipratropium-albuterol (DUONEB) 0.5-2.5 (3) MG/3ML nebulizer solution 3 mL  3 mL Nebulization Q2H PRN Rometta Emery, MD       labetalol (NORMODYNE) injection 10 mg  10 mg Intravenous Q10 min PRN Rometta Emery, MD       LORazepam (ATIVAN) tablet 1 mg  1 mg Oral Q6H PRN Rometta Emery, MD   1 mg at 10/04/23 0846   magnesium hydroxide (MILK OF MAGNESIA) suspension 30 mL  30 mL Oral Daily PRN Rometta Emery, MD       morphine (PF) 2 MG/ML injection 2 mg  2 mg Intravenous Q2H PRN Rometta Emery, MD       ondansetron (ZOFRAN) injection 4 mg  4 mg Intravenous Q6H PRN Rometta Emery, MD  Oral care mouth rinse  15 mL Mouth Rinse PRN Earlie Lou L, MD       potassium chloride SA (KLOR-CON M) CR tablet 40 mEq  40 mEq Oral Daily Earlie Lou L, MD   40 mEq at 10/04/23 0842   rosuvastatin (CRESTOR) tablet 40 mg  40 mg Oral Daily Rometta Emery, MD   40 mg at 10/04/23 9147   sacubitril-valsartan (ENTRESTO) 24-26 mg per tablet  1 tablet Oral BID Rometta Emery, MD   1 tablet at 10/04/23 0841   sodium chloride flush (NS) 0.9 % injection 3 mL  3 mL Intravenous Q12H Earlie Lou L, MD   3 mL at 10/03/23 2227   sodium chloride flush (NS) 0.9 % injection 3 mL  3 mL Intravenous PRN Rometta Emery, MD       spironolactone (ALDACTONE) tablet 12.5 mg  12.5 mg Oral Daily Earlie Lou L, MD   12.5 mg at 10/04/23 0841   traZODone (DESYREL) tablet 25-50 mg  25-50 mg Oral QHS PRN Rometta Emery, MD   50 mg at 10/03/23 2156    Medications Prior to Admission  Medication Sig Dispense Refill Last Dose/Taking   acetaminophen (TYLENOL) 325 MG tablet Take 2 tablets (650 mg total) by mouth every 4 (four) hours as needed for headache or mild pain (pain score 1-3).   Past Week   acetaminophen-caffeine (EXCEDRIN TENSION HEADACHE) 500-65 MG TABS per tablet Take 2 tablets by mouth every 8 (eight) hours as needed (headaches).   Past Week   amiodarone (PACERONE) 400 MG  tablet Take 1 tablet (400 mg total) by mouth 2 (two) times daily.   Past Week   buPROPion (WELLBUTRIN SR) 150 MG 12 hr tablet 1 tablet daily for 3 days, then 1 tablet twice daily. Stop smoking 14 days after starting medication 60 tablet 3 Past Week   busPIRone (BUSPAR) 5 MG tablet Take 1 tablet (5 mg total) by mouth 3 (three) times daily. 90 tablet 2 Past Week   cyclobenzaprine (FLEXERIL) 5 MG tablet Take 1 tablet (5 mg total) by mouth 3 (three) times daily as needed for muscle spasms.   Past Week   DULoxetine (CYMBALTA) 20 MG capsule Take 2 capsules (40 mg total) by mouth daily. 180 capsule 1 Past Week   heparin 82956 UT/250ML infusion Inject 1,900 Units/hr into the vein continuous.   10/03/2023 Morning   ipratropium-albuterol (DUONEB) 0.5-2.5 (3) MG/3ML SOLN Take 3 mLs by nebulization every 2 (two) hours as needed. (Patient taking differently: Take 3 mLs by nebulization every 2 (two) hours as needed (shortness of breath).)   Past Week   LORazepam (ATIVAN) 1 MG tablet Take 1 tablet (1 mg total) by mouth every 6 (six) hours as needed for anxiety.   Past Week   magnesium hydroxide (MILK OF MAGNESIA) 400 MG/5ML suspension Take 30 mLs by mouth daily as needed for mild constipation or moderate constipation.   Past Week   potassium chloride SA (KLOR-CON M) 20 MEQ tablet Take 2 tablets (40 mEq total) by mouth daily.   Past Week   rosuvastatin (CRESTOR) 40 MG tablet Take 1 tablet (40 mg total) by mouth daily. 90 tablet 3 Past Week   sacubitril-valsartan (ENTRESTO) 24-26 MG Take 1 tablet by mouth 2 (two) times daily.   Past Week   spironolactone (ALDACTONE) 25 MG tablet Take 0.5 tablets (12.5 mg total) by mouth daily.   Past Week   traZODone (DESYREL) 50 MG tablet Take 0.5-1 tablets (25-50  mg total) by mouth at bedtime as needed for sleep. 90 tablet 0 Past Week   furosemide (LASIX) 10 MG/ML injection Inject 8 mLs (80 mg total) into the vein 2 (two) times daily. (Patient not taking: Reported on 10/03/2023)   Not  Taking   hydrALAZINE (APRESOLINE) 20 MG/ML injection Inject 0.5 mLs (10 mg total) into the vein every 20 (twenty) minutes as needed (high blood pressure). (Patient not taking: Reported on 10/03/2023)   Not Taking   labetalol (NORMODYNE) 5 MG/ML injection Inject 2 mLs (10 mg total) into the vein every 10 (ten) minutes as needed. (Patient not taking: Reported on 10/03/2023)   Not Taking   Morphine Sulfate (MORPHINE, PF,) 2 MG/ML injection Inject 1 mL (2 mg total) into the vein every 2 (two) hours as needed. (Patient not taking: Reported on 10/03/2023)   Not Taking   ondansetron (ZOFRAN) 4 MG/2ML SOLN injection Inject 2 mLs (4 mg total) into the vein every 6 (six) hours as needed for nausea. (Patient not taking: Reported on 10/03/2023)   Not Taking   Family History: Mother-alive, age 55, has hypertension Father- decreased at age 51    Review of Systems:    Cardiac Review of Systems: Y or  [   N ]= no  Chest Pain [   Y ]  Exertional SOB  [ Y ]  Orthopnea [ Y ]   Pedal Edema [ N  ]    Palpitations [ Y ] Syncope  [N  ]   Presyncope [   ]  General Review of Systems: [Y] = yes [ N ]=no Constitional: fatigue [ Y ]; nausea [Y  ];  fever [N  ];                                                       Dental: Last Dentist visit: October 20,2023. She has upper and lower dentures  Resp: cough [ Y ];  wheezing[ N ];  hemoptysis[N  ];  GI:  vomiting[N  ];  melena[ N ];  hematochezia [ N ];  GU: hematuria[ N ];              Heme/Lymph:anemia[ Y ];  Neuro: stroke[N  ];   seizures[ N ];    Psych:depression[ Y ]; anxiety[Y  ];  Endocrine: diabetes[ N ];                 Physical Exam: BP 113/77 (BP Location: Left Arm)   Pulse 60   Temp 98.1 F (36.7 C) (Oral)   Resp 20   Ht 5\' 7"  (1.702 m)   Wt 90.9 kg   SpO2 96%   BMI 31.40 kg/m    General appearance: alert, cooperative, and no distress Head: Normocephalic, without obvious abnormality, atraumatic Neck: no carotid bruit and supple, symmetrical,  trachea midline Resp: clear to auscultation bilaterally Cardio: RRR, grade IV/VI murmur heard best at apex GI: Soft, mild obesity, non tender, bowel sounds present Extremities: No LE edema. Palpable DP/PT bilaterally Neurologic: Grossly normal  Diagnostic Studies & Laboratory data:     Recent Radiology Findings:   CARDIAC CATHETERIZATION Result Date: 10/03/2023 Conclusions: No angiographically significant atherosclerotic coronary artery disease.  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 Moderately elevated left heart, right heart, and pulmonary artery pressures.  Large v-waves  observed on PCWP tracing consistent with the patient's severe mitral regurgitation. Low normal to mildly reduced Fick cardiac output/index. Recommendations: Escalate diuresis and consider consultation with advanced heart failure team for optimization of acute diastolic heart failure in the setting of severe mitral valve regurgitation. Cardiac surgery consultation for mitral valve replacement.  Response to medical therapy will dictate if this can be done as an outpatient or if the patient will need transfer to [facility] for inpatient evaluation. Resume heparin infusion 2 hours after TR band removal. Primary prevention of coronary artery disease. Yvonne Kendall, MD [facility]  ECHO TEE Result Date: 10/03/2023    TRANSESOPHOGEAL ECHO REPORT   Patient Name:   Sandra Hines Date of Exam: 10/02/2023 Medical Rec #:  [id number]    Height:       67.0 in Accession #:    [id number]   Weight:       197.8 lb Date of Birth:  Aug 31, 1970   BSA:          2.013 m Patient Age:    52 years     BP:           121/88 mmHg Patient Gender: F            HR:           59 bpm. Exam Location:  [facility] Procedure: 3D Echo, Transesophageal Echo, Cardiac Doppler, Color Doppler and            Saline Contrast Bubble Study (Both Spectral and Color Flow Doppler            were utilized during procedure). Indications:     Severe Mitral valve regurgitation   History:         Patient has prior history of Echocardiogram examinations, most                  recent 10/01/2023. Risk Factors:Hypertension.  Sonographer:     Cristela Blue Referring Phys:  [id number] [facility] CAREY Diagnosing Phys: Julien Nordmann MD PROCEDURE: After discussion of the risks and benefits of a TEE, an informed consent was obtained from the patient. TEE procedure time was 30 minutes. The transesophogeal probe was passed without difficulty through the esophogus of the patient. Imaged were obtained with the patient in a left lateral decubitus position. Local oropharyngeal anesthetic was provided with Cetacaine and viscous lidocaine. Sedation performed by different physician. The patient was monitored while under deep sedation. Image quality was excellent. The patient's vital signs; including heart rate, blood pressure, and oxygen saturation; remained stable throughout the procedure. The patient developed no complications during the procedure.  IMPRESSIONS  1. Left ventricular ejection fraction, by estimation, is 55 to 60%. The left ventricle has normal function. The left ventricle has no regional wall motion abnormalities.  2. Right ventricular systolic function is low normal. The right ventricular size is normal.  3. The mitral valve is rheumatic, both anterior and posterior leaflets involved. Severe mitral valve regurgitation, central and eccentric jet noted. No evidence of mitral stenosis.  4. The aortic valve is normal in structure. Aortic valve regurgitation is not visualized. No aortic stenosis is present.  5. The inferior vena cava is normal in size with greater than 50% respiratory variability, suggesting right atrial pressure of 3 mmHg.  6. No left atrial/left atrial appendage thrombus was detected.  7. 3D performed of the mitral valve and demonstrates rheumatic mitral valve. Conclusion(s)/Recommendation(s): hemodynamically significant mitral valvular heart disease as detailed above. FINDINGS  Left  Ventricle: Left ventricular ejection fraction, by estimation, is 55 to 60%. The left ventricle has normal function. The left ventricle has no regional wall motion abnormalities. The left ventricular internal cavity size was normal in size. There is  no left ventricular hypertrophy. Right Ventricle: The right ventricular size is normal. No increase in right ventricular wall thickness. Right ventricular systolic function is low normal. Left Atrium: Left atrial size was normal in size. No left atrial/left atrial appendage thrombus was detected. Right Atrium: Right atrial size was normal in size. Pericardium: There is no evidence of pericardial effusion. Mitral Valve: The mitral valve is rheumatic. There is moderate thickening of the mitral valve leaflet(s). There is mild calcification of the mitral valve leaflet(s). Severe mitral valve regurgitation. No evidence of mitral valve stenosis. Tricuspid Valve: The tricuspid valve is normal in structure. Tricuspid valve regurgitation is not demonstrated. No evidence of tricuspid stenosis. Aortic Valve: The aortic valve is normal in structure. Aortic valve regurgitation is not visualized. No aortic stenosis is present. Pulmonic Valve: The pulmonic valve was normal in structure. Pulmonic valve regurgitation is not visualized. No evidence of pulmonic stenosis. Aorta: The aortic root is normal in size and structure. Venous: The inferior vena cava is normal in size with greater than 50% respiratory variability, suggesting right atrial pressure of 3 mmHg. IAS/Shunts: No atrial level shunt detected by color flow Doppler. Agitated saline contrast was given intravenously to evaluate for intracardiac shunting. Additional Comments: 3D was performed not requiring image post processing on an independent workstation and was abnormal. MR Peak grad:    116.2 mmHg MR Mean grad:    65.0 mmHg MR Vmax:         539.00 cm/s MR Vmean:        363.0 cm/s MR PISA:         3.08 cm MR PISA Eff ROA: 51  mm MR PISA Radius:  0.70 cm Julien Nordmann MD Electronically signed by Julien Nordmann MD Signature Date/Time: 10/02/2023/5:56:00 PM    Final (Updated)      I have independently reviewed the above radiologic studies and discussed with the patient   Recent Lab Findings: Lab Results  Component Value Date   WBC 10.4 10/04/2023   HGB 12.3 10/04/2023   HCT 39.0 10/04/2023   PLT 228 10/04/2023   GLUCOSE 112 (H) 10/04/2023   CHOL 175 09/26/2023   TRIG 79 09/26/2023   HDL 41 09/26/2023   LDLCALC 118 (H) 09/26/2023   ALT 81 (H) 09/30/2023   AST 29 09/30/2023   NA 135 10/04/2023   K 4.5 10/04/2023   CL 104 10/04/2023   CREATININE 1.07 (H) 10/04/2023   BUN 12 10/04/2023   CO2 23 10/04/2023   TSH 1.091 09/25/2023   INR 1.2 09/26/2023   HGBA1C 5.8 (H) 07/19/2023   Assessment / Plan:   Severe rheumatic mitral regurgitation-on Heparin drip. Dr. Leafy Ro to evaluate for MVR (likely on Monday) 2. Acute diastolic heart failure-on Lasix 80 mg IV bid, Entresto 24/26 bid and Spironolactone 12.5 mg daily 3. Recently diagnosed PAF-Likely MAZE while in OR. On Amiodarone 400 mg bid 4. History of tobacco abuse-encouraged cessation 5. 4.3 cm ascending thoracic aortic aneurysm on CTA-will need further yearly surveillance with CTA and follow up after discharge 6. Anxiety and depression-on Buspar and Cymbalta  I  spent 20 minutes counseling the patient face to face.   Doree Fudge PA-C 10/04/2023 9:13 AM  Agree with above 53 yo female with no previous rheuamtic fever history presented  with HTN and rapid afib and CHF. Work up reveals EF 55%, severe rheumatic mitral regurgitation and moderate ascending aortic aneurysm. Pt has been cathed without CAD has moderate PHTN. Currently having AHF manage diuresis. I feel she needs to have her mitral valve replaced and at her age will use mechanical prosthesis. Will do pulm vein isolation maze and consider replacing ascending aorta since it measures just under  4.5 cm on CT scan. Pt understands all the risks and goals and recovery from surgery and she wishes to proceed

## 2023-10-05 ENCOUNTER — Inpatient Hospital Stay (HOSPITAL_COMMUNITY)

## 2023-10-05 DIAGNOSIS — Z01810 Encounter for preprocedural cardiovascular examination: Secondary | ICD-10-CM

## 2023-10-05 DIAGNOSIS — I5033 Acute on chronic diastolic (congestive) heart failure: Secondary | ICD-10-CM | POA: Diagnosis not present

## 2023-10-05 LAB — PREGNANCY, URINE: Preg Test, Ur: NEGATIVE

## 2023-10-05 LAB — CBC
HCT: 42.4 % (ref 36.0–46.0)
Hemoglobin: 13.4 g/dL (ref 12.0–15.0)
MCH: 27.6 pg (ref 26.0–34.0)
MCHC: 31.6 g/dL (ref 30.0–36.0)
MCV: 87.4 fL (ref 80.0–100.0)
Platelets: 266 10*3/uL (ref 150–400)
RBC: 4.85 MIL/uL (ref 3.87–5.11)
RDW: 15 % (ref 11.5–15.5)
WBC: 10.2 10*3/uL (ref 4.0–10.5)
nRBC: 0 % (ref 0.0–0.2)

## 2023-10-05 LAB — SURGICAL PCR SCREEN
MRSA, PCR: NEGATIVE
Staphylococcus aureus: NEGATIVE

## 2023-10-05 LAB — BASIC METABOLIC PANEL
Anion gap: 9 (ref 5–15)
BUN: 15 mg/dL (ref 6–20)
CO2: 25 mmol/L (ref 22–32)
Calcium: 9 mg/dL (ref 8.9–10.3)
Chloride: 104 mmol/L (ref 98–111)
Creatinine, Ser: 1.08 mg/dL — ABNORMAL HIGH (ref 0.44–1.00)
GFR, Estimated: >60 mL/min (ref >60)
Glucose, Bld: 124 mg/dL — ABNORMAL HIGH (ref 70–99)
Potassium: 4.9 mmol/L (ref 3.5–5.1)
Sodium: 138 mmol/L (ref 135–145)

## 2023-10-05 LAB — HEPARIN LEVEL (UNFRACTIONATED): Heparin Unfractionated: 0.44 [IU]/mL (ref 0.30–0.70)

## 2023-10-05 MED ORDER — MUPIROCIN 2 % EX OINT: 1.0000 | TOPICAL_OINTMENT | Freq: Two times a day (BID) | CUTANEOUS | Status: DC

## 2023-10-05 MED ORDER — ISOSORB DINITRATE-HYDRALAZINE 20-37.5 MG PO TABS
1.0000 | ORAL_TABLET | Freq: Three times a day (TID) | ORAL | Status: DC
  Administered 2023-10-05 – 2023-10-06 (×5): 1 via ORAL
  Filled 2023-10-05 (×5): qty 1

## 2023-10-05 MED ORDER — FUROSEMIDE 10 MG/ML IJ SOLN
40.0000 mg | Freq: Two times a day (BID) | INTRAMUSCULAR | Status: DC
  Administered 2023-10-05 – 2023-10-06 (×3): 40 mg via INTRAVENOUS
  Filled 2023-10-05 (×3): qty 4

## 2023-10-05 NOTE — Plan of Care (Signed)
   Problem: Education: Goal: Knowledge of General Education information will improve Description Including pain rating scale, medication(s)/side effects and non-pharmacologic comfort measures Outcome: Progressing

## 2023-10-05 NOTE — Progress Notes (Signed)
 PHARMACY - ANTICOAGULATION CONSULT NOTE  Pharmacy Consult for Heparin Infusion Indication: atrial fibrillation  Allergies  Allergen Reactions   Penicillins Hives    Has patient had a PCN reaction causing immediate rash, facial/tongue/throat swelling, SOB or lightheadedness with hypotension: No Has patient had a PCN reaction causing severe rash involving mucus membranes or skin necrosis: No Has patient had a PCN reaction that required hospitalization: No Has patient had a PCN reaction occurring within the last 10 years: No If all of the above answers are "NO", then may proceed with Cephalosporin use.    Patient Measurements: Height: 5\' 7"  (170.2 cm) Weight: 91.6 kg (201 lb 14.4 oz) IBW/kg (Calculated) : 61.6 Heparin Dosing Weight: 82.6 kg  Vital Signs: Temp: 98 F (36.7 C) (03/15 0450) Temp Source: Oral (03/15 0450) BP: 139/77 (03/15 0450) Pulse Rate: 60 (03/15 0450)  Labs: Recent Labs    10/03/23 0447 10/03/23 0810 10/03/23 0813 10/03/23 2204 10/04/23 0556 10/05/23 0449  HGB 11.6*   < > 12.2  --  12.3 13.4  HCT 35.3*   < > 36.0  --  39.0 42.4  PLT 206  --   --   --  228 266  HEPARINUNFRC 0.88*  --   --  0.35 0.50 0.44  CREATININE 0.85  --   --   --  1.07* 1.08*   < > = values in this interval not displayed.   Estimated Creatinine Clearance: 72.4 mL/min (A) (by C-G formula based on SCr of 1.08 mg/dL (H)).  Assessment: Sandra Hines is a 53 y.o. female presenting with HF symptoms Patient with newly diagnosed atrial fibrillation and a CHA2DS2-VASc score of at least 3 (age, hypertension, and heart failure) and started on heparin at [facility]. Heparin level therapeutic on 1900 units/hr. Heparin held for cath  and resumed after 3/13  Heparin level  therapeutic at 0.44 on heparin infusion at 1900 units/hr. No s/sx of bleeding or infusion issues. Cbc stable planning OR 3/17  Goal of Therapy:  Heparin level 0.3-0.7 units/ml Monitor platelets by anticoagulation protocol: Yes    Plan:  Continue heparin at 1900 units/hr.  Daily heparin level and CBC Monitor for s/sx of bleeding   Thank you for involving pharmacy in the patient's care.   Theotis Burrow, PharmD PGY1 Acute Care Pharmacy Resident  10/05/2023 7:04 AM

## 2023-10-05 NOTE — Progress Notes (Signed)
 Patient ID: Sandra Hines, female   DOB: [birth year], 53 y.o.   MRN: [id number]     Advanced Heart Failure Rounding Note  Cardiologist: Julien Nordmann, MD  Chief Complaint: mitral regurgitation Subjective:    Patient is in NSR today.  Diuresed with IV Lasix, I/Os net negative 1644.    Objective:   Weight Range: 91.6 kg Body mass index is 31.62 kg/m.   Vital Signs:   Temp:  [97.8 F (36.6 C)-98 F (36.7 C)] 98 F (36.7 C) (03/15 0803) Pulse Rate:  [57-60] 57 (03/15 0803) Resp:  [18-20] 20 (03/15 0803) BP: (115-139)/(63-77) 115/63 (03/15 0803) SpO2:  [100 %] 100 % (03/15 0803) Weight:  [91.6 kg] 91.6 kg (03/15 0450) Last BM Date : 10/05/23  Weight change: Filed Weights   10/03/23 1858 10/04/23 0500 10/05/23 0450  Weight: 91.6 kg 90.9 kg 91.6 kg    Intake/Output:   Intake/Output Summary (Last 24 hours) at 10/05/2023 1224 Last data filed at 10/05/2023 0914 Gross per 24 hour  Intake 519.15 ml  Output 850 ml  Net -330.85 ml      Physical Exam    General:  Well appearing. No resp difficulty HEENT: Normal Neck: Supple. JVP 10 cm. Carotids 2+ bilat; no bruits. No lymphadenopathy or thyromegaly appreciated. Cor: PMI nondisplaced. Regular rate & rhythm. No rubs, gallops. 2/6 HSM apex.  Lungs: Clear Abdomen: Soft, nontender, nondistended. No hepatosplenomegaly. No bruits or masses. Good bowel sounds. Extremities: No cyanosis, clubbing, rash, edema Neuro: Alert & orientedx3, cranial nerves grossly intact. moves all 4 extremities w/o difficulty. Affect pleasant   Telemetry   NSR 60s (personally reviewed)   Labs    CBC Recent Labs    10/03/23 0447 10/03/23 0810 10/04/23 0556 10/05/23 0449  WBC 9.8  --  10.4 10.2  NEUTROABS 5.6  --   --   --   HGB 11.6*   < > 12.3 13.4  HCT 35.3*   < > 39.0 42.4  MCV 85.3  --  86.7 87.4  PLT 206  --  228 266   < > = values in this interval not displayed.   Basic Metabolic Panel Recent Labs    78/29/56 0447 10/03/23 0810  10/04/23 0556 10/05/23 0449  NA 137   < > 135 138  K 4.2   < > 4.5 4.9  CL 105  --  104 104  CO2 22  --  23 25  GLUCOSE 151*  --  112* 124*  BUN 19  --  12 15  CREATININE 0.85  --  1.07* 1.08*  CALCIUM 8.6*  --  8.9 9.0  PHOS 3.4  --   --   --    < > = values in this interval not displayed.   Liver Function Tests Recent Labs    10/03/23 0447  ALBUMIN 2.9*   No results for input(s): "LIPASE", "AMYLASE" in the last 72 hours. Cardiac Enzymes No results for input(s): "CKTOTAL", "CKMB", "CKMBINDEX", "TROPONINI" in the last 72 hours.  BNP: BNP (last 3 results) Recent Labs    09/25/23 1711  BNP 700.0*    ProBNP (last 3 results) No results for input(s): "PROBNP" in the last 8760 hours.   D-Dimer No results for input(s): "DDIMER" in the last 72 hours. Hemoglobin A1C No results for input(s): "HGBA1C" in the last 72 hours. Fasting Lipid Panel No results for input(s): "CHOL", "HDL", "LDLCALC", "TRIG", "CHOLHDL", "LDLDIRECT" in the last 72 hours. Thyroid Function Tests No results for input(s): "TSH", "  T4TOTAL", "T3FREE", "THYROIDAB" in the last 72 hours.  Invalid input(s): "FREET3"  Other results:   Imaging    No results found.   Medications:     Scheduled Medications:  amiodarone  400 mg Oral BID   busPIRone  5 mg Oral TID   DULoxetine  40 mg Oral Daily   [START ON 10/07/2023] epinephrine  0-10 mcg/min Intravenous To OR   [START ON 10/07/2023] heparin sodium (porcine) 2,500 Units, papaverine 30 mg in electrolyte-A (PLASMALYTE-A PH 7.4) 500 mL irrigation   Irrigation To OR   [START ON 10/07/2023] insulin   Intravenous To OR   isosorbide-hydrALAZINE  1 tablet Oral TID   [START ON 10/07/2023] [facility] Blood Cardioplegia vial (lidocaine/magnesium/mannitol 0.26g-4g-6.4g)   Intracoronary To OR   [START ON 10/07/2023] [facility] Blood Cardioplegia vial (lidocaine/magnesium/mannitol 0.26g-4g-6.4g)   Intracoronary To OR   mupirocin ointment  1 Application Nasal BID    [START ON 10/07/2023] phenylephrine  30-200 mcg/min Intravenous To OR   [START ON 10/07/2023] potassium chloride  80 mEq Other To OR   potassium chloride SA  40 mEq Oral Daily   rosuvastatin  40 mg Oral Daily   sodium chloride flush  3 mL Intravenous Q12H   spironolactone  12.5 mg Oral Daily   [START ON 10/07/2023] tranexamic acid  15 mg/kg Intravenous To OR   [START ON 10/07/2023] tranexamic acid  2 mg/kg Intracatheter To OR   [START ON 10/07/2023] vancomycin (VANCOCIN) 1,000 mg in sodium chloride 0.9 % 1,000 mL irrigation   Irrigation To OR    Infusions:  [START ON 10/07/2023]  ceFAZolin (ANCEF) IV     [START ON 10/07/2023]  ceFAZolin (ANCEF) IV     [START ON 10/07/2023] dexmedetomidine     [START ON 10/07/2023] heparin 30,000 units/NS 1000 mL solution for CELLSAVER     heparin 1,900 Units/hr (10/05/23 0645)   [START ON 10/07/2023] milrinone     [START ON 10/07/2023] nitroGLYCERIN     [START ON 10/07/2023] norepinephrine     [START ON 10/07/2023] tranexamic acid (CYKLOKAPRON) 2,500 mg in sodium chloride 0.9 % 250 mL (10 mg/mL) infusion     [START ON 10/07/2023] vancomycin      PRN Medications: acetaminophen, acetaminophen-caffeine, cyclobenzaprine, hydrALAZINE, ipratropium-albuterol, labetalol, LORazepam, magnesium hydroxide, ondansetron, mouth rinse, sodium chloride flush, traZODone     Assessment/Plan   1. Mitral regurgitation: TEE 3/12 with severe MR, rheumatic-appearing valve.  No mitral stenosis.  - Plan for mechanical mitral valve replacement by Dr. Leafy Ro on Monday.  2. Atrial fibrillation: Paroxysmal. Now in NSR on po amiodarone.  - Continue amiodarone 400 bid.  - Heparin gtt - Will need Maze with MVR. 3. 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  She remains mildly volume overloaded on exam. She diuresed well yesterday with IV Lasix. Needs afterload reduction with severe MR.  - Lasix 40 mg IV bid today.  - Stop Entresto  pre-surgery.  Will stop low dose hydralazine as well and start Bidil 1 tab tid.  - Continue spironolactone, watch K.   Length of Stay: 2  Marca Ancona, MD  10/05/2023, 12:24 PM  Advanced Heart Failure Team Pager [phone] (M-F; 7a - 5p)  Please contact [facility] for night-coverage after hours (5p -7a ) and weekends on [email or web address]

## 2023-10-05 NOTE — Progress Notes (Signed)
 VASCULAR LAB    Carotid duplex has been performed.  See CV proc for preliminary results.   Ronak Duquette, RVT 10/05/2023, 7:12 PM

## 2023-10-05 NOTE — Progress Notes (Signed)
 PROGRESS NOTE    Sandra Hines  TFT:[id number] DOB: [birth year] DOA: 10/03/2023 PCP: Ronnald Ramp, MD  52/F with history of hypertension, cerebral aneurysm sp repair presented to the ED with palpitations, generalized weakness and malaise, in the ED she was noted to be in A-fib RVR and chest x-ray noted CHF -3/6 placed on BiPAP and high flow, started on antibiotics, diuretics and diltiazem, echo noted EF 45-50%, severe MR -3/7 started on Amio -3/8 diuretics continued BiPAP overnight -3/10 switch to oral amiodarone, repeat limited echo -3/12 TEE, EF 55%, severe mitral regurgitation with rheumatic appearing leaflets -3/13 right and left heart cath, no significant CAD, RA 10, RV 63/10, PA 63/28, wedge 20, LVEDP 25, seen by heart failure team and transferred to [facility] for mitral valve surgery   Subjective: Breathing better overall, anxious about surgery  Assessment and Plan:  Severe mitral regurgitation  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   Acute bronchitis ?CAP No formal COPD dx but tobacco hx  -Suspect symptoms were all secondary to CHF, completed a course of steroids and antibiotics at [facility]   Essential hypertension -Stable, meds as above   Dyslipidemia -Continue statin therapy   Anxiety and depression -Continue Wellbutrin SR, BuSpar, Cymbalta.     Class 1 obesity based on BMI: Body mass index is 33.04 kg/m.  Counseled on weight loss when medically stable   DVT prophylaxis:  heparin gtt   Code Status: FULL CODE Family Communication: None present Disposition Plan:   Consultants:    Procedures:   Antimicrobials:    Objective: Vitals:   10/04/23 1448 10/04/23 1627 10/05/23 0450 10/05/23 0803  BP: 117/72 119/67 139/77 115/63  Pulse:  (!) 57 60 (!) 57  Resp:  18 20 20   Temp:  97.8 F (36.6 C) 98 F (36.7 C) 98 F (36.7 C)  TempSrc:  Oral Oral Oral  SpO2:   100% 100%  Weight:   91.6 kg   Height:        Intake/Output Summary (Last 24 hours) at 10/05/2023 1238 Last data filed at 10/05/2023 0914 Gross per 24 hour  Intake 519.15 ml  Output 850 ml  Net -330.85 ml   Filed Weights   10/03/23 1858 10/04/23 0500 10/05/23 0450  Weight: 91.6 kg 90.9 kg 91.6 kg    Examination:  Gen: Awake, Alert, Oriented X 3,  HEENT: no JVD Lungs: Good air movement bilaterally, CTAB CVS: S1S2/RRR, systolic murmur Abd: soft, Non tender, non distended, BS present Extremities: No edema Skin: no new rashes on exposed skin     Data Reviewed:   CBC: Recent Labs  Lab 10/01/23 0402 10/01/23 1017 10/03/23 0447 10/03/23 0810 10/03/23 0813 10/04/23 0556 10/05/23 0449  WBC 10.3 10.5 9.8  --   --  10.4 10.2  NEUTROABS  --   --  5.6  --   --   --   --   HGB 11.4* 11.6* 11.6* 11.9* 12.2 12.3 13.4  HCT 34.8* 36.2 35.3* 35.0* 36.0 39.0 42.4  MCV 85.5 85.4 85.3  --   --  86.7 87.4  PLT 166 196 206  --   --  228 266   Basic Metabolic Panel: Recent Labs  Lab 10/01/23 1017 10/02/23 0442 10/03/23 0447 10/03/23 0810 10/03/23 0813 10/04/23 0556 10/05/23 0449  NA 139 139 137 138 141 135 138  K 3.1* 3.6 4.2 4.2 4.3 4.5 4.9  CL 100 103 105  --   --  104 104  CO2 29 27 22   --   --  23 25  GLUCOSE 116* 100* 151*  --   --  112* 124*  BUN 20 21* 19  --   --  12 15  CREATININE 0.96 0.91 0.85  --   --  1.07* 1.08*  CALCIUM 8.9 8.7* 8.6*  --   --  8.9 9.0  PHOS  --  3.5 3.4  --   --   --   --    GFR: Estimated Creatinine Clearance: 72.4 mL/min (A) (by C-G formula  based on SCr of 1.08 mg/dL (H)). Liver Function Tests: Recent Labs  Lab 09/30/23 0007 10/02/23 0442 10/03/23 0447  AST 29  --   --   ALT 81*  --   --   ALKPHOS 116  --   --   BILITOT 0.6  --   --   PROT 6.9  --   --   ALBUMIN 3.0* 2.9* 2.9*   No results for input(s): "LIPASE", "AMYLASE" in the last 168 hours. No results for input(s): "AMMONIA" in the last 168 hours. Coagulation Profile: No results for input(s): "INR", "PROTIME" in the last 168 hours. Cardiac Enzymes: No results for input(s): "CKTOTAL", "CKMB", "CKMBINDEX", "TROPONINI" in the last 168 hours. BNP (last 3 results) No results for input(s): "PROBNP" in the last 8760 hours. HbA1C: No results for input(s): "HGBA1C" in the last 72 hours. CBG: No results for input(s): "GLUCAP" in the last 168 hours. Lipid Profile: No results for input(s): "CHOL", "HDL", "LDLCALC", "TRIG", "CHOLHDL", "LDLDIRECT" in the last 72 hours. Thyroid Function Tests: No results for input(s): "TSH", "T4TOTAL", "FREET4", "T3FREE", "THYROIDAB" in the last 72 hours. Anemia Panel: No results for input(s): "VITAMINB12", "FOLATE", "FERRITIN", "TIBC", "IRON", "RETICCTPCT" in the last 72 hours. Urine analysis:    Component Value Date/Time   COLORURINE YELLOW (A) 11/26/2020 0041   APPEARANCEUR CLEAR (A) 11/26/2020 0041   APPEARANCEUR Cloudy 08/15/2014 0519   LABSPEC 1.026 11/26/2020 0041   LABSPEC 1.017 08/15/2014 0519   PHURINE 5.0 11/26/2020 0041   GLUCOSEU NEGATIVE 11/26/2020 0041   GLUCOSEU Negative 08/15/2014 0519   HGBUR NEGATIVE 11/26/2020 0041   BILIRUBINUR NEGATIVE 11/26/2020 0041   BILIRUBINUR Negative 08/15/2014 0519   KETONESUR NEGATIVE 11/26/2020 0041   PROTEINUR NEGATIVE 11/26/2020 0041   NITRITE NEGATIVE 11/26/2020 0041   LEUKOCYTESUR NEGATIVE 11/26/2020 0041   LEUKOCYTESUR Negative 08/15/2014 0519   Sepsis Labs: @LABRCNTIP (procalcitonin:4,lacticidven:4)  ) Recent Results (from the past 240 hours)  Resp panel by RT-PCR (RSV,  Flu A&B, Covid) Anterior Nasal Swab     Status: None   Collection Time: 09/25/23  5:22 PM   Specimen: Anterior Nasal Swab  Result Value Ref Range Status   SARS Coronavirus 2 by RT PCR NEGATIVE NEGATIVE Final    Comment: (NOTE) SARS-CoV-2 target nucleic acids are NOT DETECTED.  The SARS-CoV-2 RNA is generally detectable in upper respiratory specimens during the acute phase of infection. The lowest concentration of SARS-CoV-2 viral copies this assay can detect is 138 copies/mL. A negative result does not preclude SARS-Cov-2 infection and should not be used  as the sole basis for treatment or other patient management decisions. A negative result may occur with  improper specimen collection/handling, submission of specimen other than nasopharyngeal swab, presence of viral mutation(s) within the areas targeted by this assay, and inadequate number of viral copies(<138 copies/mL). A negative result must be combined with clinical observations, patient history, and epidemiological information. The expected result is Negative.  Fact Sheet for Patients:  BloggerCourse.com  Fact Sheet for Healthcare Providers:  SeriousBroker.it  This test is no t yet approved or cleared by the [place] FDA and  has been authorized for detection and/or diagnosis of SARS-CoV-2 by FDA under an Emergency Use Authorization (EUA). This EUA will remain  in effect (meaning this test can be used) for the duration of the COVID-19 declaration under Section 564(b)(1) of the Act, 21 U.S.C.section 360bbb-3(b)(1), unless the authorization is terminated  or revoked sooner.       Influenza A by PCR NEGATIVE NEGATIVE Final   Influenza B by PCR NEGATIVE NEGATIVE Final    Comment: (NOTE) The Xpert Xpress SARS-CoV-2/FLU/RSV plus assay is intended as an aid in the diagnosis of influenza from Nasopharyngeal swab specimens and should not be used as a sole basis for  treatment. Nasal washings and aspirates are unacceptable for Xpert Xpress SARS-CoV-2/FLU/RSV testing.  Fact Sheet for Patients: BloggerCourse.com  Fact Sheet for Healthcare Providers: SeriousBroker.it  This test is not yet approved or cleared by the [place] FDA and has been authorized for detection and/or diagnosis of SARS-CoV-2 by FDA under an Emergency Use Authorization (EUA). This EUA will remain in effect (meaning this test can be used) for the duration of the COVID-19 declaration under Section 564(b)(1) of the Act, 21 U.S.C. section 360bbb-3(b)(1), unless the authorization is terminated or revoked.     Resp Syncytial Virus by PCR NEGATIVE NEGATIVE Final    Comment: (NOTE) Fact Sheet for Patients: BloggerCourse.com  Fact Sheet for Healthcare Providers: SeriousBroker.it  This test is not yet approved or cleared by the [place] FDA and has been authorized for detection and/or diagnosis of SARS-CoV-2 by FDA under an Emergency Use Authorization (EUA). This EUA will remain in effect (meaning this test can be used) for the duration of the COVID-19 declaration under Section 564(b)(1) of the Act, 21 U.S.C. section 360bbb-3(b)(1), unless the authorization is terminated or revoked.  Performed at [facility], [address] [place], [place] [address]   MRSA Next Gen by PCR, Nasal     Status: None   Collection Time: 09/26/23  1:39 AM   Specimen: Nasal Mucosa; Nasal Swab  Result Value Ref Range Status   MRSA by PCR Next Gen NOT DETECTED NOT DETECTED Final    Comment: (NOTE) The GeneXpert MRSA Assay (FDA approved for NASAL specimens only), is one component of a comprehensive MRSA colonization surveillance program. It is not intended to diagnose MRSA infection nor to guide or monitor treatment for MRSA infections. Test performance is not FDA approved in  patients less than 27 years old. Performed at [facility], [address] [place], [place] [address]   Culture, blood (Routine X 2) w Reflex to ID Panel     Status: None   Collection Time: 09/26/23  3:31 AM   Specimen: BLOOD  Result Value Ref Range Status   Specimen Description BLOOD BLOOD LEFT HAND  Final   Special Requests   Final    BOTTLES DRAWN AEROBIC AND ANAEROBIC Blood Culture adequate volume   Culture   Final  NO GROWTH 5 DAYS Performed at [facility], [address] [place], [place] [address]    Report Status 10/01/2023 FINAL  Final  Culture, blood (Routine X 2) w Reflex to ID Panel     Status: None   Collection Time: 09/26/23  3:40 AM   Specimen: BLOOD  Result Value Ref Range Status   Specimen Description BLOOD BLOOD RIGHT HAND  Final   Special Requests   Final    BOTTLES DRAWN AEROBIC AND ANAEROBIC Blood Culture adequate volume   Culture   Final    NO GROWTH 5 DAYS Performed at [facility], [address], [place], [place] [address]    Report Status 10/01/2023 FINAL  Final  Surgical PCR screen     Status: None   Collection Time: 10/05/23  2:47 AM   Specimen: Nasal Mucosa; Nasal Swab  Result Value Ref Range Status   MRSA, PCR NEGATIVE NEGATIVE Final   Staphylococcus aureus NEGATIVE NEGATIVE Final    Comment: (NOTE) The Xpert SA Assay (FDA approved for NASAL specimens in patients 71 years of age and older), is one component of a comprehensive surveillance program. It is not intended to diagnose infection nor to guide or monitor treatment. Performed at [facility], [address], [place], [place] [address]      Radiology Studies: No results found.    Scheduled Meds:  amiodarone  400 mg Oral BID   busPIRone  5 mg Oral TID   DULoxetine  40 mg Oral Daily   [START ON 10/07/2023] epinephrine  0-10 mcg/min Intravenous To OR   furosemide  40 mg Intravenous BID   [START ON 10/07/2023] heparin sodium (porcine) 2,500  Units, papaverine 30 mg in electrolyte-A (PLASMALYTE-A PH 7.4) 500 mL irrigation   Irrigation To OR   [START ON 10/07/2023] insulin   Intravenous To OR   isosorbide-hydrALAZINE  1 tablet Oral TID   [START ON 10/07/2023] [facility] Blood Cardioplegia vial (lidocaine/magnesium/mannitol 0.26g-4g-6.4g)   Intracoronary To OR   [START ON 10/07/2023] [facility] Blood Cardioplegia vial (lidocaine/magnesium/mannitol 0.26g-4g-6.4g)   Intracoronary To OR   mupirocin ointment  1 Application Nasal BID   [START ON 10/07/2023] phenylephrine  30-200 mcg/min Intravenous To OR   [START ON 10/07/2023] potassium chloride  80 mEq Other To OR   potassium chloride SA  40 mEq Oral Daily   rosuvastatin  40 mg Oral Daily   sodium chloride flush  3 mL Intravenous Q12H   spironolactone  12.5 mg Oral Daily   [START ON 10/07/2023] tranexamic acid  15 mg/kg Intravenous To OR   [START ON 10/07/2023] tranexamic acid  2 mg/kg Intracatheter To OR   [START ON 10/07/2023] vancomycin (VANCOCIN) 1,000 mg in sodium chloride 0.9 % 1,000 mL irrigation   Irrigation To OR   Continuous Infusions:  [START ON 10/07/2023]  ceFAZolin (ANCEF) IV     [START ON 10/07/2023]  ceFAZolin (ANCEF) IV     [START ON 10/07/2023] dexmedetomidine     [START ON 10/07/2023] heparin 30,000 units/NS 1000 mL solution for CELLSAVER     heparin 1,900 Units/hr (10/05/23 0645)   [START ON 10/07/2023] milrinone     [START ON 10/07/2023] nitroGLYCERIN     [START ON 10/07/2023] norepinephrine     [START ON 10/07/2023] tranexamic acid (CYKLOKAPRON) 2,500 mg in sodium chloride 0.9 % 250 mL (10 mg/mL) infusion     [START ON 10/07/2023] vancomycin       LOS: 2 days    Time spent:  Zannie Cove, MD [facility]   10/05/2023, 12:38 PM

## 2023-10-06 DIAGNOSIS — I5033 Acute on chronic diastolic (congestive) heart failure: Secondary | ICD-10-CM | POA: Diagnosis not present

## 2023-10-06 LAB — BASIC METABOLIC PANEL
Anion gap: 12 (ref 5–15)
BUN: 21 mg/dL — ABNORMAL HIGH (ref 6–20)
CO2: 22 mmol/L (ref 22–32)
Calcium: 9 mg/dL (ref 8.9–10.3)
Chloride: 99 mmol/L (ref 98–111)
Creatinine, Ser: 1.14 mg/dL — ABNORMAL HIGH (ref 0.44–1.00)
GFR, Estimated: 59 mL/min — ABNORMAL LOW (ref >60)
Glucose, Bld: 156 mg/dL — ABNORMAL HIGH (ref 70–99)
Potassium: 4.9 mmol/L (ref 3.5–5.1)
Sodium: 133 mmol/L — ABNORMAL LOW (ref 135–145)

## 2023-10-06 LAB — CBC
HCT: 40.8 % (ref 36.0–46.0)
Hemoglobin: 13.3 g/dL (ref 12.0–15.0)
MCH: 28.2 pg (ref 26.0–34.0)
MCHC: 32.6 g/dL (ref 30.0–36.0)
MCV: 86.6 fL (ref 80.0–100.0)
Platelets: 288 10*3/uL (ref 150–400)
RBC: 4.71 MIL/uL (ref 3.87–5.11)
RDW: 15.4 % (ref 11.5–15.5)
WBC: 12.9 10*3/uL — ABNORMAL HIGH (ref 4.0–10.5)
nRBC: 0 % (ref 0.0–0.2)

## 2023-10-06 LAB — HEPARIN LEVEL (UNFRACTIONATED)
Heparin Unfractionated: 0.84 [IU]/mL — ABNORMAL HIGH (ref 0.30–0.70)
Heparin Unfractionated: 0.8 [IU]/mL — ABNORMAL HIGH (ref 0.30–0.70)
Heparin Unfractionated: 0.84 [IU]/mL — ABNORMAL HIGH (ref 0.30–0.70)

## 2023-10-06 LAB — ABO/RH: ABO/RH(D): AB POS

## 2023-10-06 MED ORDER — CHLORHEXIDINE GLUCONATE 0.12 % MT SOLN
15.0000 mL | Freq: Once | OROMUCOSAL | Status: AC
  Administered 2023-10-07: 15 mL via OROMUCOSAL
  Filled 2023-10-06: qty 15

## 2023-10-06 MED ORDER — CHLORHEXIDINE GLUCONATE CLOTH 2 % EX PADS
6.0000 | MEDICATED_PAD | Freq: Once | CUTANEOUS | Status: AC
  Administered 2023-10-07: 6 via TOPICAL

## 2023-10-06 MED ORDER — METOPROLOL TARTRATE 12.5 MG HALF TABLET
12.5000 mg | ORAL_TABLET | Freq: Once | ORAL | Status: AC
  Administered 2023-10-07: 12.5 mg via ORAL
  Filled 2023-10-06: qty 1

## 2023-10-06 MED ORDER — OXYCODONE-ACETAMINOPHEN 5-325 MG PO TABS
1.0000 | ORAL_TABLET | Freq: Once | ORAL | Status: AC
  Administered 2023-10-06: 1 via ORAL
  Filled 2023-10-06: qty 1

## 2023-10-06 MED ORDER — BISACODYL 5 MG PO TBEC
5.0000 mg | DELAYED_RELEASE_TABLET | Freq: Once | ORAL | Status: AC
  Administered 2023-10-06: 5 mg via ORAL
  Filled 2023-10-06: qty 1

## 2023-10-06 MED ORDER — CHLORHEXIDINE GLUCONATE CLOTH 2 % EX PADS
6.0000 | MEDICATED_PAD | Freq: Once | CUTANEOUS | Status: AC
  Administered 2023-10-06: 6 via TOPICAL

## 2023-10-06 MED ORDER — TEMAZEPAM 15 MG PO CAPS: 15.0000 mg | ORAL_CAPSULE | Freq: Once | ORAL | Status: DC | PRN

## 2023-10-06 NOTE — Progress Notes (Signed)
 Pt still complaining of fingertip pain on RUE, informed Rathore, MD. Instructed to give Tylenol.  Bari Edward, RN

## 2023-10-06 NOTE — Progress Notes (Signed)
 PHARMACY - ANTICOAGULATION CONSULT NOTE  Pharmacy Consult for heparin Indication: atrial fibrillation  Labs: Recent Labs    10/04/23 0556 10/05/23 0449 10/06/23 0357  HGB 12.3 13.4 13.3  HCT 39.0 42.4 40.8  PLT 228 266 288  HEPARINUNFRC 0.50 0.44 0.84*  CREATININE 1.07* 1.08* 1.14*   Assessment: 52yo female supratherapeutic on heparin after several levels at goal; no infusion issues or signs of bleeding per RN.  Goal of Therapy:  Heparin level 0.3-0.7 units/ml   Plan:  Decrease heparin infusion by 2 units/kg/hr to 1700 units/hr. Check level in 6 hours.   Vernard Gambles, PharmD, BCPS 10/06/2023 6:09 AM

## 2023-10-06 NOTE — Plan of Care (Signed)
   Problem: Education: Goal: Knowledge of General Education information will improve Description Including pain rating scale, medication(s)/side effects and non-pharmacologic comfort measures Outcome: Progressing   Problem: Activity: Goal: Risk for activity intolerance will decrease Outcome: Progressing   Problem: Safety: Goal: Ability to remain free from injury will improve Outcome: Progressing

## 2023-10-06 NOTE — Progress Notes (Signed)
 Patient ID: Sandra Hines, female   DOB: [birth year], 53 y.o.   MRN: [id number]     Advanced Heart Failure Rounding Note  Cardiologist: Julien Nordmann, MD  Chief Complaint: mitral regurgitation Subjective:    Patient is in NSR today.  Diuresed with IV Lasix, weight down 1 lb.    Objective:   Weight Range: 90.8 kg Body mass index is 31.36 kg/m.   Vital Signs:   Temp:  [98.1 F (36.7 C)-98.2 F (36.8 C)] 98.1 F (36.7 C) (03/16 0358) Pulse Rate:  [53-59] 53 (03/16 0358) Resp:  [16-18] 18 (03/16 0358) BP: (111-135)/(72-79) 129/74 (03/16 0358) SpO2:  [100 %] 100 % (03/16 0358) Weight:  [90.8 kg] 90.8 kg (03/16 0358) Last BM Date : 10/05/23  Weight change: Filed Weights   10/04/23 0500 10/05/23 0450 10/06/23 0358  Weight: 90.9 kg 91.6 kg 90.8 kg    Intake/Output:   Intake/Output Summary (Last 24 hours) at 10/06/2023 1046 Last data filed at 10/06/2023 0402 Gross per 24 hour  Intake 1603.56 ml  Output 1925 ml  Net -321.44 ml      Physical Exam    General: NAD Neck: JVP 8 cm, no thyromegaly or thyroid nodule.  Lungs: Clear to auscultation bilaterally with normal respiratory effort. CV: Nondisplaced PMI.  Heart regular S1/S2, no S3/S4, 2/6 HSM apex.  No peripheral edema.   Abdomen: Soft, nontender, no hepatosplenomegaly, no distention.  Skin: Intact without lesions or rashes.  Neurologic: Alert and oriented x 3.  Psych: Normal affect. Extremities: No clubbing or cyanosis.  HEENT: Normal.   Telemetry   NSR 60s (personally reviewed)   Labs    CBC Recent Labs    10/05/23 0449 10/06/23 0357  WBC 10.2 12.9*  HGB 13.4 13.3  HCT 42.4 40.8  MCV 87.4 86.6  PLT 266 288   Basic Metabolic Panel Recent Labs    78/29/56 0449 10/06/23 0357  NA 138 133*  K 4.9 4.9  CL 104 99  CO2 25 22  GLUCOSE 124* 156*  BUN 15 21*  CREATININE 1.08* 1.14*  CALCIUM 9.0 9.0   Liver Function Tests No results for input(s): "AST", "ALT", "ALKPHOS", "BILITOT", "PROT",  "ALBUMIN" in the last 72 hours.  No results for input(s): "LIPASE", "AMYLASE" in the last 72 hours. Cardiac Enzymes No results for input(s): "CKTOTAL", "CKMB", "CKMBINDEX", "TROPONINI" in the last 72 hours.  BNP: BNP (last 3 results) Recent Labs    09/25/23 1711  BNP 700.0*    ProBNP (last 3 results) No results for input(s): "PROBNP" in the last 8760 hours.   D-Dimer No results for input(s): "DDIMER" in the last 72 hours. Hemoglobin A1C No results for input(s): "HGBA1C" in the last 72 hours. Fasting Lipid Panel No results for input(s): "CHOL", "HDL", "LDLCALC", "TRIG", "CHOLHDL", "LDLDIRECT" in the last 72 hours. Thyroid Function Tests No results for input(s): "TSH", "T4TOTAL", "T3FREE", "THYROIDAB" in the last 72 hours.  Invalid input(s): "FREET3"  Other results:   Imaging    VAS US CAROTID Result Date: 10/05/2023 Carotid Arterial Duplex Study Patient Name:  PERSEPHANIE LAATSCH  Date of Exam:   10/05/2023 Medical Rec #: [id number]     Accession #:    [id number] Date of Birth: 1970-08-14    Patient Gender: F Patient Age:   34 years Exam Location:  [facility] Procedure:      VAS US CAROTID Referring Phys: Eugenio Hoes --------------------------------------------------------------------------------  Indications:       Pre operative mitral valve replacement, pulmonary vein  isolation maze procedure and 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 Other Factors:     Atrial fibrillation, CHF. Comparison Study:  No prior study on file Performing Technologist: Sherren Kerns RVS  Examination Guidelines: A complete evaluation includes B-mode imaging, spectral Doppler, color Doppler, and power Doppler as needed of all accessible portions of each vessel. Bilateral testing is considered an integral part of a complete examination. Limited examinations for reoccurring indications may be performed  as noted.  Right Carotid Findings: +----------+--------+--------+--------+------------------+--------+           PSV cm/sEDV cm/sStenosisPlaque DescriptionComments +----------+--------+--------+--------+------------------+--------+ CCA Prox  69      12              heterogenous               +----------+--------+--------+--------+------------------+--------+ CCA Distal71      12              heterogenous               +----------+--------+--------+--------+------------------+--------+ ICA Prox  71      17              homogeneous                +----------+--------+--------+--------+------------------+--------+ ICA Mid   36      14                                         +----------+--------+--------+--------+------------------+--------+ ICA Distal52      19                                         +----------+--------+--------+--------+------------------+--------+ ECA       96      12                                         +----------+--------+--------+--------+------------------+--------+ +----------+--------+-------+--------+-------------------+           PSV cm/sEDV cmsDescribeArm Pressure (mmHG) +----------+--------+-------+--------+-------------------+ [id number]                                        +----------+--------+-------+--------+-------------------+ +---------+--------+--+--------+--+ VertebralPSV cm/s44EDV cm/s15 +---------+--------+--+--------+--+  Left Carotid Findings: +----------+--------+--------+--------+------------------+------------------+           PSV cm/sEDV cm/sStenosisPlaque DescriptionComments           +----------+--------+--------+--------+------------------+------------------+ CCA Prox  98      23                                intimal thickening +----------+--------+--------+--------+------------------+------------------+ CCA Distal72      21              homogeneous                           +----------+--------+--------+--------+------------------+------------------+ ICA Prox  39      14  homogeneous                          +----------+--------+--------+--------+------------------+------------------+ ICA Mid   38      17                                                   +----------+--------+--------+--------+------------------+------------------+ ICA Distal44      15                                tortuous           +----------+--------+--------+--------+------------------+------------------+ ECA       90      14                                                   +----------+--------+--------+--------+------------------+------------------+ +----------+--------+--------+--------+-------------------+           PSV cm/sEDV cm/sDescribeArm Pressure (mmHG) +----------+--------+--------+--------+-------------------+ [id number]                                         +----------+--------+--------+--------+-------------------+ +---------+--------+--+--------+--+ VertebralPSV cm/s87EDV cm/s23 +---------+--------+--+--------+--+   Summary: Right Carotid: The extracranial vessels were near-normal with only minimal wall                thickening or plaque. Left Carotid: The extracranial vessels were near-normal with only minimal wall               thickening or plaque. Vertebrals:  Bilateral vertebral arteries demonstrate antegrade flow. Subclavians: Normal flow hemodynamics were seen in bilateral subclavian              arteries. *See table(s) above for measurements and observations.     Preliminary      Medications:     Scheduled Medications:  amiodarone  400 mg Oral BID   busPIRone  5 mg Oral TID   DULoxetine  40 mg Oral Daily   [START ON 10/07/2023] epinephrine  0-10 mcg/min Intravenous To OR   furosemide  40 mg Intravenous BID   [START ON 10/07/2023] heparin sodium (porcine) 2,500 Units, papaverine 30 mg in electrolyte-A (PLASMALYTE-A PH  7.4) 500 mL irrigation   Irrigation To OR   [START ON 10/07/2023] insulin   Intravenous To OR   isosorbide-hydrALAZINE  1 tablet Oral TID   [START ON 10/07/2023] [facility] Blood Cardioplegia vial (lidocaine/magnesium/mannitol 0.26g-4g-6.4g)   Intracoronary To OR   [START ON 10/07/2023] [place] Blood Cardioplegia vial (lidocaine/magnesium/mannitol 0.26g-4g-6.4g)   Intracoronary To OR   [START ON 10/07/2023] phenylephrine  30-200 mcg/min Intravenous To OR   [START ON 10/07/2023] potassium chloride  80 mEq Other To OR   rosuvastatin  40 mg Oral Daily   sodium chloride flush  3 mL Intravenous Q12H   spironolactone  12.5 mg Oral Daily   [START ON 10/07/2023] tranexamic acid  15 mg/kg Intravenous To OR   [START ON 10/07/2023] tranexamic acid  2 mg/kg Intracatheter To OR   [START ON 10/07/2023] vancomycin (VANCOCIN) 1,000 mg in sodium chloride 0.9 % 1,000 mL irrigation  Irrigation To OR    Infusions:  [START ON 10/07/2023]  ceFAZolin (ANCEF) IV     [START ON 10/07/2023]  ceFAZolin (ANCEF) IV     [START ON 10/07/2023] dexmedetomidine     [START ON 10/07/2023] heparin 30,000 units/NS 1000 mL solution for CELLSAVER     heparin 1,700 Units/hr (10/06/23 0724)   [START ON 10/07/2023] milrinone     [START ON 10/07/2023] nitroGLYCERIN     [START ON 10/07/2023] norepinephrine     [START ON 10/07/2023] tranexamic acid (CYKLOKAPRON) 2,500 mg in sodium chloride 0.9 % 250 mL (10 mg/mL) infusion     [START ON 10/07/2023] vancomycin      PRN Medications: acetaminophen, acetaminophen-caffeine, cyclobenzaprine, hydrALAZINE, ipratropium-albuterol, labetalol, LORazepam, magnesium hydroxide, ondansetron, mouth rinse, sodium chloride flush, traZODone     Assessment/Plan   1. Mitral regurgitation: TEE 3/12 with severe MR, rheumatic-appearing valve.  No mitral stenosis.  - Plan for mechanical mitral valve replacement by Dr. Leafy Ro on Monday.  2. Atrial fibrillation: Paroxysmal. Now in NSR on po amiodarone.  - Continue  amiodarone 400 bid.  - Heparin gtt - Will need Maze with MVR. 3. 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  Needs afterload reduction with severe MR. Suspect mild volume overload.  - Lasix 40 mg IV bid today.  - Stopped Entresto pre-surgery.  Continue afterload reduction with Bidil 1 tab tid.  - Continue spironolactone, watch K.   Length of Stay: 3  Marca Ancona, MD  10/06/2023, 10:46 AM  Advanced Heart Failure Team Pager [phone] (M-F; 7a - 5p)  Please contact [facility] for night-coverage after hours (5p -7a ) and weekends on [email or web address]

## 2023-10-06 NOTE — Progress Notes (Signed)
 PHARMACY - ANTICOAGULATION CONSULT NOTE  Pharmacy Consult for Heparin Infusion Indication: atrial fibrillation  Allergies  Allergen Reactions   Penicillins Hives    Has patient had a PCN reaction causing immediate rash, facial/tongue/throat swelling, SOB or lightheadedness with hypotension: No Has patient had a PCN reaction causing severe rash involving mucus membranes or skin necrosis: No Has patient had a PCN reaction that required hospitalization: No Has patient had a PCN reaction occurring within the last 10 years: No If all of the above answers are "NO", then may proceed with Cephalosporin use.    Patient Measurements: Height: 5\' 7"  (170.2 cm) Weight: 90.8 kg (200 lb 3.2 oz) IBW/kg (Calculated) : 61.6 Heparin Dosing Weight: 82.6 kg  Vital Signs: Temp: 98.4 F (36.9 C) (03/16 2028) Temp Source: Oral (03/16 2028) BP: 152/91 (03/16 2028) Pulse Rate: 62 (03/16 2028)  Labs: Recent Labs    10/04/23 0556 10/05/23 0449 10/06/23 0357 10/06/23 1349 10/06/23 2051  HGB 12.3 13.4 13.3  --   --   HCT 39.0 42.4 40.8  --   --   PLT 228 266 288  --   --   HEPARINUNFRC 0.50 0.44 0.84* 0.80* 0.84*  CREATININE 1.07* 1.08* 1.14*  --   --    Estimated Creatinine Clearance: 68.3 mL/min (A) (by C-G formula based on SCr of 1.14 mg/dL (H)).  Assessment: Sandra Hines is a 53 y.o. female presenting with HF symptoms Patient with newly diagnosed atrial fibrillation and a CHA2DS2-VASc score of at least 3 (age, hypertension, and heart failure) and started on heparin at [facility]. Heparin held for cath  and resumed after 3/13  Heparin level remains slightly elevated. MVR tomorrow.  Goal of Therapy:  Heparin level 0.3-0.7 units/ml Monitor platelets by anticoagulation protocol: Yes   Plan:  Decrease heparin to 1400 units/h Daily heparin level and CBC  Fredonia Highland, PharmD, [place], [facility] Clinical Pharmacist [phone] Please check AMION for all [facility] Pharmacy numbers 10/06/2023

## 2023-10-06 NOTE — Progress Notes (Signed)
 PHARMACY - ANTICOAGULATION CONSULT NOTE  Pharmacy Consult for Heparin Infusion Indication: atrial fibrillation  Allergies  Allergen Reactions   Penicillins Hives    Has patient had a PCN reaction causing immediate rash, facial/tongue/throat swelling, SOB or lightheadedness with hypotension: No Has patient had a PCN reaction causing severe rash involving mucus membranes or skin necrosis: No Has patient had a PCN reaction that required hospitalization: No Has patient had a PCN reaction occurring within the last 10 years: No If all of the above answers are "NO", then may proceed with Cephalosporin use.    Patient Measurements: Height: 5\' 7"  (170.2 cm) Weight: 90.8 kg (200 lb 3.2 oz) IBW/kg (Calculated) : 61.6 Heparin Dosing Weight: 82.6 kg  Vital Signs: Temp: 98.1 F (36.7 C) (03/16 0358) Temp Source: Oral (03/16 0358) BP: 129/74 (03/16 0358) Pulse Rate: 53 (03/16 0358)  Labs: Recent Labs    10/04/23 0556 10/05/23 0449 10/06/23 0357 10/06/23 1349  HGB 12.3 13.4 13.3  --   HCT 39.0 42.4 40.8  --   PLT 228 266 288  --   HEPARINUNFRC 0.50 0.44 0.84* 0.80*  CREATININE 1.07* 1.08* 1.14*  --    Estimated Creatinine Clearance: 68.3 mL/min (A) (by C-G formula based on SCr of 1.14 mg/dL (H)).  Assessment: PAOLA ALESHIRE is a 53 y.o. female presenting with HF symptoms Patient with newly diagnosed atrial fibrillation and a CHA2DS2-VASc score of at least 3 (age, hypertension, and heart failure) and started on heparin at [facility]. Heparin held for cath  and resumed after 3/13  Heparin level supratherapeutic at 0.8 on 1700 units/hr. Cbc stable. No issues with infusion or signs of bleeding per RN. Planning OR 3/17 for MVR.   Goal of Therapy:  Heparin level 0.3-0.7 units/ml Monitor platelets by anticoagulation protocol: Yes   Plan:  Decrease heparin to 1600 units/hr  6 hour heparin level  Daily heparin level and CBC Monitor for s/sx of bleeding   Thank you for involving pharmacy in  the patient's care.   Theotis Burrow, PharmD PGY1 Acute Care Pharmacy Resident  10/06/2023 3:03 PM

## 2023-10-06 NOTE — Plan of Care (Signed)

## 2023-10-06 NOTE — Anesthesia Preprocedure Evaluation (Signed)
 Anesthesia Evaluation  Patient identified by MRN, date of birth, ID band Patient awake    Reviewed: Allergy & Precautions, NPO status , Patient's Chart, lab work & pertinent test results  History of Anesthesia Complications Negative for: history of anesthetic complications  Airway Mallampati: II  TM Distance: >3 FB Neck ROM: Full    Dental no notable dental hx. (+) Teeth Intact   Pulmonary neg sleep apnea, neg COPD, Current Smoker and Patient abstained from smoking.   Pulmonary exam normal breath sounds clear to auscultation       Cardiovascular Exercise Tolerance: Good METShypertension, Pt. on medications +CHF  (-) CAD and (-) Past MI (-) dysrhythmias + Valvular Problems/Murmurs MR  Rhythm:Regular Rate:Bradycardia  Severe MR  TEE IMPRESSIONS     1. Left ventricular ejection fraction, by estimation, is 55 to 60%. The  left ventricle has normal function. The left ventricle has no regional  wall motion abnormalities.   2. Right ventricular systolic function is low normal. The right  ventricular size is normal.   3. The mitral valve is rheumatic, both anterior and posterior leaflets  involved. Severe mitral valve regurgitation, central and eccentric jet  noted. No evidence of mitral stenosis.   4. The aortic valve is normal in structure. Aortic valve regurgitation is  not visualized. No aortic stenosis is present.   5. The inferior vena cava is normal in size with greater than 50%  respiratory variability, suggesting right atrial pressure of 3 mmHg.   6. No left atrial/left atrial appendage thrombus was detected.   7. 3D performed of the mitral valve and demonstrates rheumatic mitral  valve.  IMPRESSIONS     1. Left ventricular ejection fraction, by estimation, is 55 to 60%. The  left ventricle has normal function. The left ventricle has no regional  wall motion abnormalities.   2. Right ventricular systolic function is  low normal. The right  ventricular size is normal.   3. The mitral valve is rheumatic, both anterior and posterior leaflets  involved. Severe mitral valve regurgitation, central and eccentric jet  noted. No evidence of mitral stenosis.   4. The aortic valve is normal in structure. Aortic valve regurgitation is  not visualized. No aortic stenosis is present.   5. The inferior vena cava is normal in size with greater than 50%  respiratory variability, suggesting right atrial pressure of 3 mmHg.   6. No left atrial/left atrial appendage thrombus was detected.   7. 3D performed of the mitral valve and demonstrates rheumatic mitral  valve.     Neuro/Psych  PSYCHIATRIC DISORDERS Anxiety Depression    negative neurological ROS  negative psych ROS   GI/Hepatic ,neg GERD  ,,(+)     (-) substance abuse    Endo/Other  neg diabetes    Renal/GU negative Renal ROS     Musculoskeletal  (+) Arthritis ,    Abdominal   Peds  Hematology   Anesthesia Other Findings   Reproductive/Obstetrics                              Anesthesia Physical Anesthesia Plan  ASA: 4  Anesthesia Plan: General   Post-op Pain Management:    Induction: Intravenous  PONV Risk Score and Plan: 3 and Ondansetron and Treatment may vary due to age or medical condition  Airway Management Planned: Oral ETT  Additional Equipment: Arterial line, PA Cath, CVP, TEE, 3D TEE and Ultrasound Guidance Line Placement  Intra-op Plan:   Post-operative Plan:   Informed Consent: I have reviewed the patients History and Physical, chart, labs and discussed the procedure including the risks, benefits and alternatives for the proposed anesthesia with the patient or authorized representative who has indicated his/her understanding and acceptance.     Dental advisory given  Plan Discussed with: CRNA and Surgeon  Anesthesia Plan Comments:         Anesthesia Quick Evaluation

## 2023-10-06 NOTE — Progress Notes (Signed)
 PROGRESS NOTE    Sandra Hines  NWG:[id number] DOB: [birth year] DOA: 10/03/2023 PCP: Ronnald Ramp, MD  52/F with history of hypertension, cerebral aneurysm sp repair presented to the ED with palpitations, generalized weakness and malaise, in the ED she was noted to be in A-fib RVR and chest x-ray noted CHF -3/6 placed on BiPAP and high flow, started on antibiotics, diuretics and diltiazem, echo noted EF 45-50%, severe MR -3/7 started on Amio -3/8 diuretics continued BiPAP overnight -3/10 switch to oral amiodarone, repeat limited echo -3/12 TEE, EF 55%, severe mitral regurgitation with rheumatic appearing leaflets -3/13 right and left heart cath, no significant CAD, RA 10, RV 63/10, PA 63/28, wedge 20, LVEDP 25, seen by heart failure team and transferred to [facility] for mitral valve surgery   Subjective: -Complains of some stiffness and discomfort in her fingers, breathing better  Assessment and Plan:  Severe mitral regurgitation  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   Acute bronchitis ?CAP No formal COPD dx but tobacco hx  -Suspect symptoms were all secondary to CHF, completed a course of steroids and antibiotics at [facility]   Essential hypertension -Stable, meds as above   Dyslipidemia -Continue statin therapy   Anxiety and depression -Continue Wellbutrin SR, BuSpar, Cymbalta.     Class 1 obesity based on BMI: Body mass index is 33.04 kg/m.   Counseled on weight loss when medically stable   DVT prophylaxis: heparin gtt   Code Status: FULL CODE Family Communication: None present Disposition Plan:   Antimicrobials:    Objective: Vitals:   10/05/23 1454 10/05/23 1800 10/05/23 1940 10/06/23 0358  BP: 118/72 135/79 111/74 129/74  Pulse:   (!) 59 (!) 53  Resp:   16 18  Temp:   98.2 F (36.8 C) 98.1 F (36.7 C)  TempSrc:   Oral Oral  SpO2:   100% 100%  Weight:    90.8 kg  Height:        Intake/Output Summary (Last 24 hours) at 10/06/2023 1149 Last data filed at 10/06/2023 0402 Gross per 24 hour  Intake 1603.56 ml  Output 1925 ml  Net -321.44 ml   Filed Weights   10/04/23 0500 10/05/23 0450 10/06/23 0358  Weight: 90.9 kg 91.6 kg 90.8 kg    Examination:  Gen: Awake, Alert, Oriented X 3,  HEENT: no JVD Lungs: Good air movement bilaterally, CTAB CVS: S1S2/RRR, systolic murmur Abd: soft, Non tender, non distended, BS present Extremities: No edema Skin: no new rashes on exposed skin     Data Reviewed:   CBC: Recent Labs  Lab 10/01/23 1017 10/03/23 0447 10/03/23 0810 10/03/23 0813 10/04/23 0556 10/05/23 0449 10/06/23 0357  WBC 10.5 9.8  --   --  10.4 10.2 12.9*  NEUTROABS  --  5.6  --   --   --   --   --   HGB 11.6* 11.6* 11.9* 12.2 12.3 13.4 13.3  HCT 36.2 35.3* 35.0* 36.0 39.0 42.4 40.8  MCV 85.4 85.3  --   --  86.7 87.4 86.6  PLT 196 206  --   --  228 266 288   Basic Metabolic Panel: Recent Labs  Lab 10/02/23 0442 10/03/23 0447 10/03/23 0810 10/03/23 0813 10/04/23 0556 10/05/23 0449 10/06/23 0357  NA 139 137 138 141 135 138 133*  K 3.6 4.2 4.2 4.3 4.5 4.9 4.9  CL 103 105  --   --  104 104 99  CO2 27 22  --   --  23 25 22   GLUCOSE 100* 151*  --   --  112* 124* 156*  BUN 21* 19  --   --  12 15 21*  CREATININE 0.91 0.85  --   --  1.07* 1.08* 1.14*  CALCIUM 8.7* 8.6*  --   --  8.9 9.0 9.0  PHOS 3.5 3.4  --   --   --   --   --    GFR: Estimated Creatinine Clearance: 68.3 mL/min (A)  (by C-G formula based on SCr of 1.14 mg/dL (H)). Liver Function Tests: Recent Labs  Lab 09/30/23 0007 10/02/23 0442 10/03/23 0447  AST 29  --   --   ALT 81*  --   --   ALKPHOS 116  --   --   BILITOT 0.6  --   --   PROT 6.9  --   --   ALBUMIN 3.0* 2.9* 2.9*   No results for input(s): "LIPASE", "AMYLASE" in the last 168 hours. No results for input(s): "AMMONIA" in the last 168 hours. Coagulation Profile: No results for input(s): "INR", "PROTIME" in the last 168 hours. Cardiac Enzymes: No results for input(s): "CKTOTAL", "CKMB", "CKMBINDEX", "TROPONINI" in the last 168 hours. BNP (last 3 results) No results for input(s): "PROBNP" in the last 8760 hours. HbA1C: No results for input(s): "HGBA1C" in the last 72 hours. CBG: No results for input(s): "GLUCAP" in the last 168 hours. Lipid Profile: No results for input(s): "CHOL", "HDL", "LDLCALC", "TRIG", "CHOLHDL", "LDLDIRECT" in the last 72 hours. Thyroid Function Tests: No results for input(s): "TSH", "T4TOTAL", "FREET4", "T3FREE", "THYROIDAB" in the last 72 hours. Anemia Panel: No results for input(s): "VITAMINB12", "FOLATE", "FERRITIN", "TIBC", "IRON", "RETICCTPCT" in the last 72 hours. Urine analysis:    Component Value Date/Time   COLORURINE YELLOW (A) 11/26/2020 0041   APPEARANCEUR CLEAR (A) 11/26/2020 0041   APPEARANCEUR Cloudy 08/15/2014 0519   LABSPEC 1.026 11/26/2020 0041   LABSPEC 1.017 08/15/2014 0519   PHURINE 5.0 11/26/2020 0041   GLUCOSEU NEGATIVE 11/26/2020 0041   GLUCOSEU Negative 08/15/2014 0519   HGBUR NEGATIVE 11/26/2020 0041   BILIRUBINUR NEGATIVE 11/26/2020 0041   BILIRUBINUR Negative 08/15/2014 0519   KETONESUR NEGATIVE 11/26/2020 0041   PROTEINUR NEGATIVE 11/26/2020 0041   NITRITE NEGATIVE 11/26/2020 0041   LEUKOCYTESUR NEGATIVE 11/26/2020 0041   LEUKOCYTESUR Negative 08/15/2014 0519   Sepsis Labs: @LABRCNTIP (procalcitonin:4,lacticidven:4)  ) Recent Results (from the past 240 hours)  Surgical  PCR screen     Status: None   Collection Time: 10/05/23  2:47 AM   Specimen: Nasal Mucosa; Nasal Swab  Result Value Ref Range Status   MRSA, PCR NEGATIVE NEGATIVE Final   Staphylococcus aureus NEGATIVE NEGATIVE Final    Comment: (NOTE) The Xpert SA Assay (FDA approved for NASAL specimens in patients 7 years of age and older), is one component of a comprehensive surveillance program. It is not intended to diagnose infection nor to guide or monitor treatment. Performed at [facility], [address], [place], [place] [address]  Radiology Studies: VAS US CAROTID Result Date: 10/05/2023 Carotid Arterial Duplex Study Patient Name:  Sandra Hines  Date of Exam:   10/05/2023 Medical Rec #: [id number]     Accession #:    [id number] Date of Birth: September 28, 1970    Patient Gender: F Patient Age:   89 years Exam Location:  [facility] Procedure:      VAS US CAROTID Referring Phys: Eugenio Hoes --------------------------------------------------------------------------------  Indications:       Pre operative mitral valve replacement, pulmonary vein                    isolation maze procedure and 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 Other Factors:     Atrial fibrillation, CHF. Comparison Study:  No prior study on file Performing Technologist: Sherren Kerns RVS  Examination Guidelines: A complete evaluation includes B-mode imaging, spectral Doppler, color Doppler, and power Doppler as needed of all accessible portions of each vessel. Bilateral testing is considered an integral part of a complete examination. Limited examinations for reoccurring indications may be performed as noted.  Right Carotid Findings: +----------+--------+--------+--------+------------------+--------+           PSV cm/sEDV cm/sStenosisPlaque DescriptionComments +----------+--------+--------+--------+------------------+--------+ CCA  Prox  69      12              heterogenous               +----------+--------+--------+--------+------------------+--------+ CCA Distal71      12              heterogenous               +----------+--------+--------+--------+------------------+--------+ ICA Prox  71      17              homogeneous                +----------+--------+--------+--------+------------------+--------+ ICA Mid   36      14                                         +----------+--------+--------+--------+------------------+--------+ ICA Distal52      19                                         +----------+--------+--------+--------+------------------+--------+ ECA       96      12                                         +----------+--------+--------+--------+------------------+--------+ +----------+--------+-------+--------+-------------------+           PSV cm/sEDV cmsDescribeArm Pressure (mmHG) +----------+--------+-------+--------+-------------------+ [id number]                                        +----------+--------+-------+--------+-------------------+ +---------+--------+--+--------+--+ VertebralPSV cm/s44EDV cm/s15 +---------+--------+--+--------+--+  Left Carotid Findings: +----------+--------+--------+--------+------------------+------------------+           PSV cm/sEDV cm/sStenosisPlaque DescriptionComments           +----------+--------+--------+--------+------------------+------------------+  CCA Prox  98      23                                intimal thickening +----------+--------+--------+--------+------------------+------------------+ CCA Distal72      21              homogeneous                          +----------+--------+--------+--------+------------------+------------------+ ICA Prox  39      14              homogeneous                          +----------+--------+--------+--------+------------------+------------------+ ICA Mid    38      17                                                   +----------+--------+--------+--------+------------------+------------------+ ICA Distal44      15                                tortuous           +----------+--------+--------+--------+------------------+------------------+ ECA       90      14                                                   +----------+--------+--------+--------+------------------+------------------+ +----------+--------+--------+--------+-------------------+           PSV cm/sEDV cm/sDescribeArm Pressure (mmHG) +----------+--------+--------+--------+-------------------+ [id number]                                         +----------+--------+--------+--------+-------------------+ +---------+--------+--+--------+--+ VertebralPSV cm/s87EDV cm/s23 +---------+--------+--+--------+--+   Summary: Right Carotid: The extracranial vessels were near-normal with only minimal wall                thickening or plaque. Left Carotid: The extracranial vessels were near-normal with only minimal wall               thickening or plaque. Vertebrals:  Bilateral vertebral arteries demonstrate antegrade flow. Subclavians: Normal flow hemodynamics were seen in bilateral subclavian              arteries. *See table(s) above for measurements and observations.     Preliminary       Scheduled Meds:  amiodarone  400 mg Oral BID   busPIRone  5 mg Oral TID   DULoxetine  40 mg Oral Daily   [START ON 10/07/2023] epinephrine  0-10 mcg/min Intravenous To OR   furosemide  40 mg Intravenous BID   [START ON 10/07/2023] heparin sodium (porcine) 2,500 Units, papaverine 30 mg in electrolyte-A (PLASMALYTE-A PH 7.4) 500 mL irrigation   Irrigation To OR   [START ON 10/07/2023] insulin   Intravenous To OR   isosorbide-hydrALAZINE  1 tablet Oral TID   [START ON 10/07/2023] [place] Blood Cardioplegia vial (lidocaine/magnesium/mannitol 0.26g-4g-6.4g)   Intracoronary To OR    [  START ON 10/07/2023] [place] Blood Cardioplegia vial (lidocaine/magnesium/mannitol 0.26g-4g-6.4g)   Intracoronary To OR   [START ON 10/07/2023] phenylephrine  30-200 mcg/min Intravenous To OR   [START ON 10/07/2023] potassium chloride  80 mEq Other To OR   rosuvastatin  40 mg Oral Daily   sodium chloride flush  3 mL Intravenous Q12H   spironolactone  12.5 mg Oral Daily   [START ON 10/07/2023] tranexamic acid  15 mg/kg Intravenous To OR   [START ON 10/07/2023] tranexamic acid  2 mg/kg Intracatheter To OR   [START ON 10/07/2023] vancomycin (VANCOCIN) 1,000 mg in sodium chloride 0.9 % 1,000 mL irrigation   Irrigation To OR   Continuous Infusions:  [START ON 10/07/2023]  ceFAZolin (ANCEF) IV     [START ON 10/07/2023]  ceFAZolin (ANCEF) IV     [START ON 10/07/2023] dexmedetomidine     [START ON 10/07/2023] heparin 30,000 units/NS 1000 mL solution for CELLSAVER     heparin 1,700 Units/hr (10/06/23 0724)   [START ON 10/07/2023] milrinone     [START ON 10/07/2023] nitroGLYCERIN     [START ON 10/07/2023] norepinephrine     [START ON 10/07/2023] tranexamic acid (CYKLOKAPRON) 2,500 mg in sodium chloride 0.9 % 250 mL (10 mg/mL) infusion     [START ON 10/07/2023] vancomycin       LOS: 3 days    Time spent:    Zannie Cove, MD [facility]   10/06/2023, 11:49 AM

## 2023-10-06 NOTE — Progress Notes (Addendum)
 Pt complaining of pins/needles sensation in her right fingertips, capillary refill less than three seconds in all fingertips, pulse oximetry placed on right thumb, 97%, reverse barbeau completed with A wave result. Attempted to notify Loney Loh, MD. PRN Flexeril administered.   Bari Edward, RN

## 2023-10-07 ENCOUNTER — Encounter (HOSPITAL_COMMUNITY): Payer: Self-pay | Admitting: Internal Medicine

## 2023-10-07 ENCOUNTER — Other Ambulatory Visit: Payer: Self-pay

## 2023-10-07 ENCOUNTER — Inpatient Hospital Stay (HOSPITAL_COMMUNITY): Payer: Self-pay

## 2023-10-07 ENCOUNTER — Inpatient Hospital Stay (HOSPITAL_COMMUNITY)

## 2023-10-07 ENCOUNTER — Encounter: Admitting: Family

## 2023-10-07 ENCOUNTER — Encounter (HOSPITAL_COMMUNITY)
Admission: EM | Disposition: A | Discharge status: Home / Self Care | Payer: Self-pay | Source: Other Acute Inpatient Hospital | Attending: Thoracic Surgery (Cardiothoracic Vascular Surgery)

## 2023-10-07 DIAGNOSIS — I110 Hypertensive heart disease with heart failure: Secondary | ICD-10-CM

## 2023-10-07 DIAGNOSIS — R0689 Other abnormalities of breathing: Secondary | ICD-10-CM

## 2023-10-07 DIAGNOSIS — Z952 Presence of prosthetic heart valve: Secondary | ICD-10-CM

## 2023-10-07 DIAGNOSIS — I509 Heart failure, unspecified: Secondary | ICD-10-CM

## 2023-10-07 DIAGNOSIS — R7303 Prediabetes: Secondary | ICD-10-CM

## 2023-10-07 DIAGNOSIS — D62 Acute posthemorrhagic anemia: Secondary | ICD-10-CM

## 2023-10-07 DIAGNOSIS — I051 Rheumatic mitral insufficiency: Secondary | ICD-10-CM

## 2023-10-07 DIAGNOSIS — I10 Essential (primary) hypertension: Secondary | ICD-10-CM

## 2023-10-07 DIAGNOSIS — F17210 Nicotine dependence, cigarettes, uncomplicated: Secondary | ICD-10-CM

## 2023-10-07 DIAGNOSIS — I4891 Unspecified atrial fibrillation: Secondary | ICD-10-CM

## 2023-10-07 DIAGNOSIS — I340 Nonrheumatic mitral (valve) insufficiency: Secondary | ICD-10-CM

## 2023-10-07 DIAGNOSIS — I5033 Acute on chronic diastolic (congestive) heart failure: Secondary | ICD-10-CM | POA: Diagnosis not present

## 2023-10-07 HISTORY — PX: MITRAL VALVE REPLACEMENT: SHX147

## 2023-10-07 HISTORY — PX: INTRAOPERATIVE TRANSESOPHAGEAL ECHOCARDIOGRAM: SHX5062

## 2023-10-07 HISTORY — PX: CLIPPING OF ATRIAL APPENDAGE: SHX5773

## 2023-10-07 HISTORY — PX: MAZE: SHX5063

## 2023-10-07 LAB — POCT I-STAT 7, (LYTES, BLD GAS, ICA,H+H)
Acid-base deficit: 3 mmol/L — ABNORMAL HIGH (ref 0.0–2.0)
Acid-base deficit: 2 mmol/L (ref 0.0–2.0)
Acid-base deficit: 2 mmol/L (ref 0.0–2.0)
Acid-base deficit: 3 mmol/L — ABNORMAL HIGH (ref 0.0–2.0)
Acid-base deficit: 4 mmol/L — ABNORMAL HIGH (ref 0.0–2.0)
Acid-base deficit: 5 mmol/L — ABNORMAL HIGH (ref 0.0–2.0)
Acid-base deficit: 2 mmol/L (ref 0.0–2.0)
Acid-base deficit: 4 mmol/L — ABNORMAL HIGH (ref 0.0–2.0)
Bicarbonate: 24.4 mmol/L (ref 20.0–28.0)
Bicarbonate: 22.8 mmol/L (ref 20.0–28.0)
Bicarbonate: 22.4 mmol/L (ref 20.0–28.0)
Bicarbonate: 23.2 mmol/L (ref 20.0–28.0)
Bicarbonate: 21.8 mmol/L (ref 20.0–28.0)
Bicarbonate: 22.3 mmol/L (ref 20.0–28.0)
Bicarbonate: 23.3 mmol/L (ref 20.0–28.0)
Bicarbonate: 22.5 mmol/L (ref 20.0–28.0)
Calcium, Ion: 1.25 mmol/L (ref 1.15–1.40)
Calcium, Ion: 0.96 mmol/L — ABNORMAL LOW (ref 1.15–1.40)
Calcium, Ion: 1.09 mmol/L — ABNORMAL LOW (ref 1.15–1.40)
Calcium, Ion: 1.2 mmol/L (ref 1.15–1.40)
Calcium, Ion: 1.22 mmol/L (ref 1.15–1.40)
Calcium, Ion: 1.27 mmol/L (ref 1.15–1.40)
Calcium, Ion: 1.07 mmol/L — ABNORMAL LOW (ref 1.15–1.40)
Calcium, Ion: 1.23 mmol/L (ref 1.15–1.40)
HCT: 42 % (ref 36.0–46.0)
HCT: 29 % — ABNORMAL LOW (ref 36.0–46.0)
HCT: 28 % — ABNORMAL LOW (ref 36.0–46.0)
HCT: 32 % — ABNORMAL LOW (ref 36.0–46.0)
HCT: 27 % — ABNORMAL LOW (ref 36.0–46.0)
HCT: 30 % — ABNORMAL LOW (ref 36.0–46.0)
HCT: 33 % — ABNORMAL LOW (ref 36.0–46.0)
HCT: 33 % — ABNORMAL LOW (ref 36.0–46.0)
Hemoglobin: 14.3 g/dL (ref 12.0–15.0)
Hemoglobin: 9.9 g/dL — ABNORMAL LOW (ref 12.0–15.0)
Hemoglobin: 10.9 g/dL — ABNORMAL LOW (ref 12.0–15.0)
Hemoglobin: 9.5 g/dL — ABNORMAL LOW (ref 12.0–15.0)
Hemoglobin: 9.2 g/dL — ABNORMAL LOW (ref 12.0–15.0)
Hemoglobin: 10.2 g/dL — ABNORMAL LOW (ref 12.0–15.0)
Hemoglobin: 11.2 g/dL — ABNORMAL LOW (ref 12.0–15.0)
Hemoglobin: 11.2 g/dL — ABNORMAL LOW (ref 12.0–15.0)
O2 Saturation: 100 %
O2 Saturation: 100 %
O2 Saturation: 100 %
O2 Saturation: 100 %
O2 Saturation: 96 %
O2 Saturation: 95 %
O2 Saturation: 98 %
O2 Saturation: 97 %
Patient temperature: 35.2
Patient temperature: 36.4
Patient temperature: 36.6
Patient temperature: 36.1
Potassium: 5.1 mmol/L (ref 3.5–5.1)
Potassium: 5 mmol/L (ref 3.5–5.1)
Potassium: 5.1 mmol/L (ref 3.5–5.1)
Potassium: 5.4 mmol/L — ABNORMAL HIGH (ref 3.5–5.1)
Potassium: 4.7 mmol/L (ref 3.5–5.1)
Potassium: 5.4 mmol/L — ABNORMAL HIGH (ref 3.5–5.1)
Potassium: 6.5 mmol/L (ref 3.5–5.1)
Potassium: 5.9 mmol/L — ABNORMAL HIGH (ref 3.5–5.1)
Sodium: 134 mmol/L — ABNORMAL LOW (ref 135–145)
Sodium: 134 mmol/L — ABNORMAL LOW (ref 135–145)
Sodium: 132 mmol/L — ABNORMAL LOW (ref 135–145)
Sodium: 133 mmol/L — ABNORMAL LOW (ref 135–145)
Sodium: 136 mmol/L (ref 135–145)
Sodium: 134 mmol/L — ABNORMAL LOW (ref 135–145)
Sodium: 136 mmol/L (ref 135–145)
Sodium: 133 mmol/L — ABNORMAL LOW (ref 135–145)
TCO2: 26 mmol/L (ref 22–32)
TCO2: 24 mmol/L (ref 22–32)
TCO2: 24 mmol/L (ref 22–32)
TCO2: 24 mmol/L (ref 22–32)
TCO2: 23 mmol/L (ref 22–32)
TCO2: 24 mmol/L (ref 22–32)
TCO2: 25 mmol/L (ref 22–32)
TCO2: 24 mmol/L (ref 22–32)
pCO2 arterial: 49.7 mmHg — ABNORMAL HIGH (ref 32–48)
pCO2 arterial: 38.2 mmHg (ref 32–48)
pCO2 arterial: 40.1 mmHg (ref 32–48)
pCO2 arterial: 41.4 mmHg (ref 32–48)
pCO2 arterial: 40.9 mmHg (ref 32–48)
pCO2 arterial: 46.9 mmHg (ref 32–48)
pCO2 arterial: 41.6 mmHg (ref 32–48)
pCO2 arterial: 42.6 mmHg (ref 32–48)
pH, Arterial: 7.3 — ABNORMAL LOW (ref 7.35–7.45)
pH, Arterial: 7.385 (ref 7.35–7.45)
pH, Arterial: 7.341 — ABNORMAL LOW (ref 7.35–7.45)
pH, Arterial: 7.37 (ref 7.35–7.45)
pH, Arterial: 7.326 — ABNORMAL LOW (ref 7.35–7.45)
pH, Arterial: 7.282 — ABNORMAL LOW (ref 7.35–7.45)
pH, Arterial: 7.354 (ref 7.35–7.45)
pH, Arterial: 7.327 — ABNORMAL LOW (ref 7.35–7.45)
pO2, Arterial: 290 mmHg — ABNORMAL HIGH (ref 83–108)
pO2, Arterial: 343 mmHg — ABNORMAL HIGH (ref 83–108)
pO2, Arterial: 262 mmHg — ABNORMAL HIGH (ref 83–108)
pO2, Arterial: 349 mmHg — ABNORMAL HIGH (ref 83–108)
pO2, Arterial: 80 mmHg — ABNORMAL LOW (ref 83–108)
pO2, Arterial: 85 mmHg (ref 83–108)
pO2, Arterial: 105 mmHg (ref 83–108)
pO2, Arterial: 99 mmHg (ref 83–108)

## 2023-10-07 LAB — BASIC METABOLIC PANEL
Anion gap: 12 (ref 5–15)
Anion gap: 8 (ref 5–15)
BUN: 25 mg/dL — ABNORMAL HIGH (ref 6–20)
BUN: 24 mg/dL — ABNORMAL HIGH (ref 6–20)
CO2: 21 mmol/L — ABNORMAL LOW (ref 22–32)
CO2: 22 mmol/L (ref 22–32)
Calcium: 9.3 mg/dL (ref 8.9–10.3)
Calcium: 8.7 mg/dL — ABNORMAL LOW (ref 8.9–10.3)
Chloride: 99 mmol/L (ref 98–111)
Chloride: 104 mmol/L (ref 98–111)
Creatinine, Ser: 1.4 mg/dL — ABNORMAL HIGH (ref 0.44–1.00)
Creatinine, Ser: 1.43 mg/dL — ABNORMAL HIGH (ref 0.44–1.00)
GFR, Estimated: 46 mL/min — ABNORMAL LOW (ref >60)
GFR, Estimated: 45 mL/min — ABNORMAL LOW (ref >60)
Glucose, Bld: 145 mg/dL — ABNORMAL HIGH (ref 70–99)
Glucose, Bld: 129 mg/dL — ABNORMAL HIGH (ref 70–99)
Potassium: 5.1 mmol/L (ref 3.5–5.1)
Potassium: 6.1 mmol/L — ABNORMAL HIGH (ref 3.5–5.1)
Sodium: 132 mmol/L — ABNORMAL LOW (ref 135–145)
Sodium: 134 mmol/L — ABNORMAL LOW (ref 135–145)

## 2023-10-07 LAB — ECHO INTRAOPERATIVE TEE
AR max vel: 2.12 cm2
AV Area VTI: 2.03 cm2
AV Area mean vel: 2.12 cm2
AV Mean grad: 5.5 mmHg
AV Peak grad: 10.3 mmHg
Ao pk vel: 1.61 m/s
Area-P 1/2: 2.24 cm2
Height: 67 in
MV M vel: 4.78 m/s
MV Peak grad: 91.4 mmHg
MV VTI: 1.41 cm2
P 1/2 time: 624 ms
Radius: 0.6 cm
Weight: 3125.24 [oz_av]

## 2023-10-07 LAB — CBC
HCT: 42.5 % (ref 36.0–46.0)
HCT: 28 % — ABNORMAL LOW (ref 36.0–46.0)
HCT: 33.6 % — ABNORMAL LOW (ref 36.0–46.0)
Hemoglobin: 13.7 g/dL (ref 12.0–15.0)
Hemoglobin: 8.9 g/dL — ABNORMAL LOW (ref 12.0–15.0)
Hemoglobin: 10.5 g/dL — ABNORMAL LOW (ref 12.0–15.0)
MCH: 27.8 pg (ref 26.0–34.0)
MCH: 28.5 pg (ref 26.0–34.0)
MCH: 27.8 pg (ref 26.0–34.0)
MCHC: 32.2 g/dL (ref 30.0–36.0)
MCHC: 31.8 g/dL (ref 30.0–36.0)
MCHC: 31.3 g/dL (ref 30.0–36.0)
MCV: 86.2 fL (ref 80.0–100.0)
MCV: 89.7 fL (ref 80.0–100.0)
MCV: 88.9 fL (ref 80.0–100.0)
Platelets: 268 10*3/uL (ref 150–400)
Platelets: 58 10*3/uL — ABNORMAL LOW (ref 150–400)
Platelets: 75 10*3/uL — ABNORMAL LOW (ref 150–400)
RBC: 4.93 MIL/uL (ref 3.87–5.11)
RBC: 3.12 MIL/uL — ABNORMAL LOW (ref 3.87–5.11)
RBC: 3.78 MIL/uL — ABNORMAL LOW (ref 3.87–5.11)
RDW: 15.2 % (ref 11.5–15.5)
RDW: 15.4 % (ref 11.5–15.5)
RDW: 15.4 % (ref 11.5–15.5)
WBC: 11.7 10*3/uL — ABNORMAL HIGH (ref 4.0–10.5)
WBC: 16.8 10*3/uL — ABNORMAL HIGH (ref 4.0–10.5)
WBC: 16.9 10*3/uL — ABNORMAL HIGH (ref 4.0–10.5)
nRBC: 0 % (ref 0.0–0.2)
nRBC: 0 % (ref 0.0–0.2)
nRBC: 0 % (ref 0.0–0.2)

## 2023-10-07 LAB — POCT I-STAT, CHEM 8
BUN: 28 mg/dL — ABNORMAL HIGH (ref 6–20)
BUN: 25 mg/dL — ABNORMAL HIGH (ref 6–20)
BUN: 27 mg/dL — ABNORMAL HIGH (ref 6–20)
BUN: 27 mg/dL — ABNORMAL HIGH (ref 6–20)
Calcium, Ion: 1.28 mmol/L (ref 1.15–1.40)
Calcium, Ion: 1.08 mmol/L — ABNORMAL LOW (ref 1.15–1.40)
Calcium, Ion: 1.13 mmol/L — ABNORMAL LOW (ref 1.15–1.40)
Calcium, Ion: 1.2 mmol/L (ref 1.15–1.40)
Chloride: 101 mmol/L (ref 98–111)
Chloride: 101 mmol/L (ref 98–111)
Chloride: 103 mmol/L (ref 98–111)
Chloride: 102 mmol/L (ref 98–111)
Creatinine, Ser: 1.3 mg/dL — ABNORMAL HIGH (ref 0.44–1.00)
Creatinine, Ser: 1.3 mg/dL — ABNORMAL HIGH (ref 0.44–1.00)
Creatinine, Ser: 1.6 mg/dL — ABNORMAL HIGH (ref 0.44–1.00)
Creatinine, Ser: 1.6 mg/dL — ABNORMAL HIGH (ref 0.44–1.00)
Glucose, Bld: 118 mg/dL — ABNORMAL HIGH (ref 70–99)
Glucose, Bld: 129 mg/dL — ABNORMAL HIGH (ref 70–99)
Glucose, Bld: 143 mg/dL — ABNORMAL HIGH (ref 70–99)
Glucose, Bld: 125 mg/dL — ABNORMAL HIGH (ref 70–99)
HCT: 41 % (ref 36.0–46.0)
HCT: 32 % — ABNORMAL LOW (ref 36.0–46.0)
HCT: 35 % — ABNORMAL LOW (ref 36.0–46.0)
HCT: 26 % — ABNORMAL LOW (ref 36.0–46.0)
Hemoglobin: 13.9 g/dL (ref 12.0–15.0)
Hemoglobin: 10.9 g/dL — ABNORMAL LOW (ref 12.0–15.0)
Hemoglobin: 11.9 g/dL — ABNORMAL LOW (ref 12.0–15.0)
Hemoglobin: 8.8 g/dL — ABNORMAL LOW (ref 12.0–15.0)
Potassium: 5.1 mmol/L (ref 3.5–5.1)
Potassium: 4.6 mmol/L (ref 3.5–5.1)
Potassium: 6.2 mmol/L — ABNORMAL HIGH (ref 3.5–5.1)
Potassium: 5.1 mmol/L (ref 3.5–5.1)
Sodium: 135 mmol/L (ref 135–145)
Sodium: 131 mmol/L — ABNORMAL LOW (ref 135–145)
Sodium: 134 mmol/L — ABNORMAL LOW (ref 135–145)
Sodium: 132 mmol/L — ABNORMAL LOW (ref 135–145)
TCO2: 25 mmol/L (ref 22–32)
TCO2: 22 mmol/L (ref 22–32)
TCO2: 25 mmol/L (ref 22–32)
TCO2: 25 mmol/L (ref 22–32)

## 2023-10-07 LAB — TYPE AND SCREEN
ABO/RH(D): AB POS
Antibody Screen: NEGATIVE

## 2023-10-07 LAB — POCT I-STAT EG7
Acid-Base Excess: 0 mmol/L (ref 0.0–2.0)
Bicarbonate: 24.7 mmol/L (ref 20.0–28.0)
Calcium, Ion: 1.05 mmol/L — ABNORMAL LOW (ref 1.15–1.40)
HCT: 29 % — ABNORMAL LOW (ref 36.0–46.0)
Hemoglobin: 9.9 g/dL — ABNORMAL LOW (ref 12.0–15.0)
O2 Saturation: 76 %
Potassium: 4.6 mmol/L (ref 3.5–5.1)
Sodium: 134 mmol/L — ABNORMAL LOW (ref 135–145)
TCO2: 26 mmol/L (ref 22–32)
pCO2, Ven: 41.6 mmHg — ABNORMAL LOW (ref 44–60)
pH, Ven: 7.382 (ref 7.25–7.43)
pO2, Ven: 42 mmHg (ref 32–45)

## 2023-10-07 LAB — GLUCOSE, CAPILLARY
Glucose-Capillary: 107 mg/dL — ABNORMAL HIGH (ref 70–99)
Glucose-Capillary: 108 mg/dL — ABNORMAL HIGH (ref 70–99)
Glucose-Capillary: 124 mg/dL — ABNORMAL HIGH (ref 70–99)
Glucose-Capillary: 123 mg/dL — ABNORMAL HIGH (ref 70–99)
Glucose-Capillary: 127 mg/dL — ABNORMAL HIGH (ref 70–99)
Glucose-Capillary: 135 mg/dL — ABNORMAL HIGH (ref 70–99)
Glucose-Capillary: 121 mg/dL — ABNORMAL HIGH (ref 70–99)
Glucose-Capillary: 174 mg/dL — ABNORMAL HIGH (ref 70–99)
Glucose-Capillary: 138 mg/dL — ABNORMAL HIGH (ref 70–99)
Glucose-Capillary: 126 mg/dL — ABNORMAL HIGH (ref 70–99)

## 2023-10-07 LAB — SEDIMENTATION RATE: Sed Rate: 53 mm/h — ABNORMAL HIGH (ref 0–22)

## 2023-10-07 LAB — PROTIME-INR
INR: 1.7 — ABNORMAL HIGH (ref 0.8–1.2)
Prothrombin Time: 19.8 s — ABNORMAL HIGH (ref 11.4–15.2)

## 2023-10-07 LAB — PLATELET COUNT: Platelets: 43 10*3/uL — ABNORMAL LOW (ref 150–400)

## 2023-10-07 LAB — APTT: aPTT: 41 s — ABNORMAL HIGH (ref 24–36)

## 2023-10-07 LAB — HEMOGLOBIN AND HEMATOCRIT, BLOOD
HCT: 33.7 % — ABNORMAL LOW (ref 36.0–46.0)
Hemoglobin: 10.7 g/dL — ABNORMAL LOW (ref 12.0–15.0)

## 2023-10-07 LAB — HEPARIN LEVEL (UNFRACTIONATED): Heparin Unfractionated: 0.59 [IU]/mL (ref 0.30–0.70)

## 2023-10-07 LAB — MAGNESIUM: Magnesium: 3.9 mg/dL — ABNORMAL HIGH (ref 1.7–2.4)

## 2023-10-07 SURGERY — REPLACEMENT, MITRAL VALVE
Anesthesia: General | Site: Chest

## 2023-10-07 MED ORDER — BISACODYL 5 MG PO TBEC
10.0000 mg | DELAYED_RELEASE_TABLET | Freq: Every day | ORAL | Status: DC
  Administered 2023-10-08 – 2023-10-14 (×7): 10 mg via ORAL
  Filled 2023-10-07 (×7): qty 2

## 2023-10-07 MED ORDER — FENTANYL CITRATE (PF) 250 MCG/5ML IJ SOLN
INTRAMUSCULAR | Status: AC
  Filled 2023-10-07: qty 5

## 2023-10-07 MED ORDER — LACTATED RINGERS IV SOLN: INTRAVENOUS | Status: DC | PRN

## 2023-10-07 MED ORDER — DEXMEDETOMIDINE HCL IN NACL 400 MCG/100ML IV SOLN: 0.0000 ug/kg/h | INTRAVENOUS | Status: DC

## 2023-10-07 MED ORDER — LEVOFLOXACIN IN D5W 750 MG/150ML IV SOLN
750.0000 mg | INTRAVENOUS | Status: AC
  Administered 2023-10-08: 750 mg via INTRAVENOUS
  Filled 2023-10-07: qty 150

## 2023-10-07 MED ORDER — MIDAZOLAM HCL (PF) 10 MG/2ML IJ SOLN
INTRAMUSCULAR | Status: AC
  Filled 2023-10-07: qty 2

## 2023-10-07 MED ORDER — VANCOMYCIN HCL IN DEXTROSE 1-5 GM/200ML-% IV SOLN
1000.0000 mg | Freq: Once | INTRAVENOUS | Status: AC
  Administered 2023-10-07: 1000 mg via INTRAVENOUS
  Filled 2023-10-07: qty 200

## 2023-10-07 MED ORDER — IPRATROPIUM-ALBUTEROL 0.5-2.5 (3) MG/3ML IN SOLN
RESPIRATORY_TRACT | Status: AC
  Administered 2023-10-07: 3 mL
  Filled 2023-10-07: qty 3

## 2023-10-07 MED ORDER — SODIUM CHLORIDE 0.9% FLUSH
3.0000 mL | Freq: Two times a day (BID) | INTRAVENOUS | Status: DC
  Administered 2023-10-07 (×2): 3 mL via INTRAVENOUS

## 2023-10-07 MED ORDER — BUSPIRONE HCL 5 MG PO TABS
5.0000 mg | ORAL_TABLET | Freq: Three times a day (TID) | ORAL | Status: DC
  Administered 2023-10-08 – 2023-10-14 (×19): 5 mg via ORAL
  Filled 2023-10-07 (×19): qty 1

## 2023-10-07 MED ORDER — ASPIRIN 81 MG PO CHEW: 324.0000 mg | CHEWABLE_TABLET | Freq: Every day | ORAL | Status: DC

## 2023-10-07 MED ORDER — ASPIRIN 325 MG PO TBEC: 325.0000 mg | DELAYED_RELEASE_TABLET | Freq: Every day | ORAL | Status: DC

## 2023-10-07 MED ORDER — METOPROLOL TARTRATE 5 MG/5ML IV SOLN: 2.5000 mg | INTRAVENOUS | Status: DC | PRN

## 2023-10-07 MED ORDER — SODIUM BICARBONATE 8.4 % IV SOLN
50.0000 meq | Freq: Once | INTRAVENOUS | Status: AC
  Administered 2023-10-07: 50 meq via INTRAVENOUS

## 2023-10-07 MED ORDER — EPINEPHRINE PF 1 MG/ML IJ SOLN
INTRAMUSCULAR | Status: DC | PRN
  Administered 2023-10-07: .01 mg via INTRAVENOUS

## 2023-10-07 MED ORDER — DOCUSATE SODIUM 100 MG PO CAPS
200.0000 mg | ORAL_CAPSULE | Freq: Every day | ORAL | Status: DC
  Administered 2023-10-08 – 2023-10-14 (×7): 200 mg via ORAL
  Filled 2023-10-07 (×7): qty 2

## 2023-10-07 MED ORDER — DULOXETINE HCL 20 MG PO CPEP
40.0000 mg | ORAL_CAPSULE | Freq: Every day | ORAL | Status: DC
  Administered 2023-10-08 – 2023-10-14 (×7): 40 mg via ORAL
  Filled 2023-10-07 (×7): qty 2

## 2023-10-07 MED ORDER — PROPOFOL 10 MG/ML IV BOLUS
INTRAVENOUS | Status: DC | PRN
  Administered 2023-10-07 (×2): 50 mg via INTRAVENOUS
  Administered 2023-10-07: 30 mg via INTRAVENOUS

## 2023-10-07 MED ORDER — SODIUM CHLORIDE 0.9 % IV SOLN
INTRAVENOUS | Status: AC
Start: 2023-10-07 — End: 2023-10-08

## 2023-10-07 MED ORDER — METOPROLOL TARTRATE 25 MG/10 ML ORAL SUSPENSION: 12.5000 mg | Freq: Two times a day (BID) | ORAL | Status: DC

## 2023-10-07 MED ORDER — DEXTROSE 50 % IV SOLN: 0.0000 mL | INTRAVENOUS | Status: DC | PRN

## 2023-10-07 MED ORDER — LIDOCAINE 2% (20 MG/ML) 5 ML SYRINGE
INTRAMUSCULAR | Status: AC
  Filled 2023-10-07: qty 5

## 2023-10-07 MED ORDER — METOCLOPRAMIDE HCL 5 MG/ML IJ SOLN
10.0000 mg | Freq: Four times a day (QID) | INTRAMUSCULAR | Status: AC
  Administered 2023-10-07 – 2023-10-08 (×5): 10 mg via INTRAVENOUS
  Filled 2023-10-07 (×6): qty 2

## 2023-10-07 MED ORDER — MIDAZOLAM HCL 2 MG/2ML IJ SOLN
2.0000 mg | INTRAMUSCULAR | Status: DC | PRN
  Administered 2023-10-07: 2 mg via INTRAVENOUS
  Filled 2023-10-07: qty 2

## 2023-10-07 MED ORDER — OXYCODONE HCL 5 MG PO TABS: 5.0000 mg | ORAL_TABLET | ORAL | Status: DC | PRN

## 2023-10-07 MED ORDER — SODIUM CHLORIDE 0.9% FLUSH: 3.0000 mL | INTRAVENOUS | Status: DC | PRN

## 2023-10-07 MED ORDER — MORPHINE SULFATE (PF) 2 MG/ML IV SOLN
1.0000 mg | INTRAVENOUS | Status: DC | PRN
  Administered 2023-10-07 (×2): 2 mg via INTRAVENOUS
  Administered 2023-10-08: 4 mg via INTRAVENOUS
  Administered 2023-10-08 – 2023-10-09 (×3): 2 mg via INTRAVENOUS
  Filled 2023-10-07 (×5): qty 1
  Filled 2023-10-07: qty 2

## 2023-10-07 MED ORDER — TRAMADOL HCL 50 MG PO TABS: 50.0000 mg | ORAL_TABLET | ORAL | Status: DC | PRN

## 2023-10-07 MED ORDER — INSULIN REGULAR(HUMAN) IN NACL 100-0.9 UT/100ML-% IV SOLN: INTRAVENOUS | Status: DC

## 2023-10-07 MED ORDER — POTASSIUM CHLORIDE 10 MEQ/50ML IV SOLN: 10.0000 meq | INTRAVENOUS | Status: AC

## 2023-10-07 MED ORDER — ASPIRIN 325 MG PO TBEC
325.0000 mg | DELAYED_RELEASE_TABLET | Freq: Every day | ORAL | Status: DC
  Administered 2023-10-07: 325 mg via ORAL
  Filled 2023-10-07: qty 1

## 2023-10-07 MED ORDER — ARTIFICIAL TEARS OPHTHALMIC OINT
TOPICAL_OINTMENT | OPHTHALMIC | Status: DC | PRN
  Administered 2023-10-07: 1 via OPHTHALMIC

## 2023-10-07 MED ORDER — ROCURONIUM BROMIDE 10 MG/ML (PF) SYRINGE
PREFILLED_SYRINGE | INTRAVENOUS | Status: DC | PRN
  Administered 2023-10-07: 100 mg via INTRAVENOUS

## 2023-10-07 MED ORDER — CHLORHEXIDINE GLUCONATE 0.12 % MT SOLN
15.0000 mL | OROMUCOSAL | Status: AC
  Administered 2023-10-07: 15 mL via OROMUCOSAL
  Filled 2023-10-07: qty 15

## 2023-10-07 MED ORDER — MAGNESIUM SULFATE 4 GM/100ML IV SOLN
4.0000 g | Freq: Once | INTRAVENOUS | Status: AC
  Administered 2023-10-07: 4 g via INTRAVENOUS
  Filled 2023-10-07: qty 100

## 2023-10-07 MED ORDER — PANTOPRAZOLE SODIUM 40 MG PO TBEC
40.0000 mg | DELAYED_RELEASE_TABLET | Freq: Every day | ORAL | Status: DC
  Administered 2023-10-09 – 2023-10-14 (×6): 40 mg via ORAL
  Filled 2023-10-07 (×7): qty 1

## 2023-10-07 MED ORDER — SODIUM CHLORIDE 0.9% FLUSH
3.0000 mL | Freq: Two times a day (BID) | INTRAVENOUS | Status: DC
  Administered 2023-10-07 (×2): 10 mL via INTRAVENOUS

## 2023-10-07 MED ORDER — ONDANSETRON HCL 4 MG/2ML IJ SOLN
4.0000 mg | Freq: Four times a day (QID) | INTRAMUSCULAR | Status: DC | PRN
  Administered 2023-10-08 – 2023-10-10 (×2): 4 mg via INTRAVENOUS
  Filled 2023-10-07 (×2): qty 2

## 2023-10-07 MED ORDER — PROTAMINE SULFATE 10 MG/ML IV SOLN
INTRAVENOUS | Status: AC
  Filled 2023-10-07: qty 50

## 2023-10-07 MED ORDER — PROPOFOL 10 MG/ML IV BOLUS
INTRAVENOUS | Status: AC
Start: 2023-10-07 — End: ?
  Filled 2023-10-07: qty 20

## 2023-10-07 MED ORDER — ASPIRIN 81 MG PO CHEW
324.0000 mg | CHEWABLE_TABLET | Freq: Once | ORAL | Status: DC
  Filled 2023-10-07: qty 4

## 2023-10-07 MED ORDER — ACETAMINOPHEN 160 MG/5ML PO SOLN: 650.0000 mg | Freq: Once | ORAL | Status: AC

## 2023-10-07 MED ORDER — OXYCODONE HCL 5 MG PO TABS
5.0000 mg | ORAL_TABLET | ORAL | Status: DC | PRN
  Administered 2023-10-07 (×2): 5 mg via ORAL
  Administered 2023-10-08 – 2023-10-14 (×29): 10 mg via ORAL
  Filled 2023-10-07 (×17): qty 2
  Filled 2023-10-07: qty 1
  Filled 2023-10-07 (×10): qty 2
  Filled 2023-10-07: qty 1
  Filled 2023-10-07 (×3): qty 2

## 2023-10-07 MED ORDER — TRAMADOL HCL 50 MG PO TABS
50.0000 mg | ORAL_TABLET | ORAL | Status: DC | PRN
  Administered 2023-10-07: 50 mg via ORAL
  Administered 2023-10-08 – 2023-10-14 (×9): 100 mg via ORAL
  Filled 2023-10-07: qty 2
  Filled 2023-10-07: qty 1
  Filled 2023-10-07 (×8): qty 2

## 2023-10-07 MED ORDER — ROCURONIUM BROMIDE 10 MG/ML (PF) SYRINGE
PREFILLED_SYRINGE | INTRAVENOUS | Status: AC
  Filled 2023-10-07: qty 10

## 2023-10-07 MED ORDER — PHENYLEPHRINE 80 MCG/ML (10ML) SYRINGE FOR IV PUSH (FOR BLOOD PRESSURE SUPPORT)
PREFILLED_SYRINGE | INTRAVENOUS | Status: AC
  Filled 2023-10-07: qty 10

## 2023-10-07 MED ORDER — PROTAMINE SULFATE 10 MG/ML IV SOLN
INTRAVENOUS | Status: DC | PRN
  Administered 2023-10-07: 360 mg via INTRAVENOUS

## 2023-10-07 MED ORDER — METOPROLOL TARTRATE 12.5 MG HALF TABLET
12.5000 mg | ORAL_TABLET | Freq: Two times a day (BID) | ORAL | Status: DC
  Administered 2023-10-07: 12.5 mg via ORAL
  Filled 2023-10-07 (×2): qty 1

## 2023-10-07 MED ORDER — SODIUM CHLORIDE (PF) 0.9 % IJ SOLN
INTRAMUSCULAR | Status: AC
  Filled 2023-10-07: qty 10

## 2023-10-07 MED ORDER — BISACODYL 10 MG RE SUPP: 10.0000 mg | Freq: Every day | RECTAL | Status: DC

## 2023-10-07 MED ORDER — PHENYLEPHRINE HCL-NACL 20-0.9 MG/250ML-% IV SOLN: 0.0000 ug/min | INTRAVENOUS | Status: DC

## 2023-10-07 MED ORDER — ACETAMINOPHEN 160 MG/5ML PO SOLN
1000.0000 mg | Freq: Four times a day (QID) | ORAL | Status: AC
Start: 2023-10-08 — End: 2023-10-12
  Filled 2023-10-07: qty 40.6

## 2023-10-07 MED ORDER — PROPOFOL 500 MG/50ML IV EMUL
INTRAVENOUS | Status: DC | PRN
  Administered 2023-10-07: 50 ug/kg/min via INTRAVENOUS

## 2023-10-07 MED ORDER — LIDOCAINE HCL (CARDIAC) PF 100 MG/5ML IV SOSY
PREFILLED_SYRINGE | INTRAVENOUS | Status: DC | PRN
  Administered 2023-10-07: 100 mg via INTRAVENOUS

## 2023-10-07 MED ORDER — CALCIUM CHLORIDE 10 % IV SOLN
INTRAVENOUS | Status: AC
Start: 2023-10-07 — End: ?
  Filled 2023-10-07: qty 10

## 2023-10-07 MED ORDER — MIDAZOLAM HCL (PF) 5 MG/ML IJ SOLN
INTRAMUSCULAR | Status: DC | PRN
Start: 2023-10-07 — End: 2023-10-07
  Administered 2023-10-07: 1 mg via INTRAVENOUS
  Administered 2023-10-07: 2 mg via INTRAVENOUS
  Administered 2023-10-07: 1 mg via INTRAVENOUS
  Administered 2023-10-07: 2 mg via INTRAVENOUS

## 2023-10-07 MED ORDER — PANTOPRAZOLE SODIUM 40 MG IV SOLR
40.0000 mg | Freq: Every day | INTRAVENOUS | Status: AC
  Administered 2023-10-07: 40 mg via INTRAVENOUS
  Filled 2023-10-07: qty 10

## 2023-10-07 MED ORDER — HEPARIN SODIUM (PORCINE) 1000 UNIT/ML IJ SOLN
INTRAMUSCULAR | Status: DC | PRN
  Administered 2023-10-07: 36000 [IU] via INTRAVENOUS

## 2023-10-07 MED ORDER — EPHEDRINE SULFATE-NACL 50-0.9 MG/10ML-% IV SOSY
PREFILLED_SYRINGE | INTRAVENOUS | Status: DC | PRN
  Administered 2023-10-07 (×2): 5 mg via INTRAVENOUS

## 2023-10-07 MED ORDER — PROPOFOL 1000 MG/100ML IV EMUL
INTRAVENOUS | Status: AC
  Filled 2023-10-07: qty 100

## 2023-10-07 MED ORDER — EPHEDRINE 5 MG/ML INJ
INTRAVENOUS | Status: AC
  Filled 2023-10-07: qty 5

## 2023-10-07 MED ORDER — METHADONE HCL IV SYRINGE 10 MG/ML FOR CABG
0.3000 mg/kg | Freq: Once | INTRAMUSCULAR | Status: AC
  Administered 2023-10-07: 18 mg via INTRAVENOUS
  Filled 2023-10-07: qty 1.8

## 2023-10-07 MED ORDER — VANCOMYCIN HCL 1000 MG IV SOLR: INTRAVENOUS | Status: DC | PRN

## 2023-10-07 MED ORDER — SUGAMMADEX SODIUM 200 MG/2ML IV SOLN
INTRAVENOUS | Status: DC | PRN
  Administered 2023-10-07: 200 mg via INTRAVENOUS

## 2023-10-07 MED ORDER — ACETAMINOPHEN 500 MG PO TABS
1000.0000 mg | ORAL_TABLET | Freq: Four times a day (QID) | ORAL | Status: AC
  Administered 2023-10-07 – 2023-10-12 (×20): 1000 mg via ORAL
  Filled 2023-10-07 (×20): qty 2

## 2023-10-07 MED ORDER — PLASMA-LYTE A IV SOLN: INTRAVENOUS | Status: DC | PRN

## 2023-10-07 MED ORDER — SODIUM CHLORIDE 0.9% FLUSH
3.0000 mL | Freq: Two times a day (BID) | INTRAVENOUS | Status: DC
Start: 2023-10-07 — End: 2023-10-08
  Administered 2023-10-07 (×2): 3 mL via INTRAVENOUS

## 2023-10-07 MED ORDER — CALCIUM CHLORIDE 10 % IV SOLN
INTRAVENOUS | Status: DC | PRN
  Administered 2023-10-07 (×2): 200 mg via INTRAVENOUS
  Administered 2023-10-07 (×2): 300 mg via INTRAVENOUS

## 2023-10-07 MED ORDER — CHLORHEXIDINE GLUCONATE CLOTH 2 % EX PADS
6.0000 | MEDICATED_PAD | Freq: Every day | CUTANEOUS | Status: DC
  Administered 2023-10-07 – 2023-10-09 (×3): 6 via TOPICAL

## 2023-10-07 MED ORDER — NITROGLYCERIN IN D5W 200-5 MCG/ML-% IV SOLN: 0.0000 ug/min | INTRAVENOUS | Status: DC

## 2023-10-07 MED ORDER — ALBUMIN HUMAN 5 % IV SOLN
250.0000 mL | INTRAVENOUS | Status: DC | PRN
  Administered 2023-10-07: 12.5 g via INTRAVENOUS

## 2023-10-07 MED ORDER — ARTIFICIAL TEARS OPHTHALMIC OINT
TOPICAL_OINTMENT | OPHTHALMIC | Status: AC
  Filled 2023-10-07: qty 3.5

## 2023-10-07 MED ORDER — FENTANYL CITRATE (PF) 250 MCG/5ML IJ SOLN
INTRAMUSCULAR | Status: DC | PRN
  Administered 2023-10-07 (×2): 100 ug via INTRAVENOUS
  Administered 2023-10-07: 50 ug via INTRAVENOUS
  Administered 2023-10-07: 100 ug via INTRAVENOUS

## 2023-10-07 MED ORDER — ONDANSETRON HCL 4 MG/2ML IJ SOLN
INTRAMUSCULAR | Status: DC | PRN
  Administered 2023-10-07: 4 mg via INTRAVENOUS

## 2023-10-07 MED ORDER — ALBUMIN HUMAN 5 % IV SOLN: INTRAVENOUS | Status: DC | PRN

## 2023-10-07 MED ORDER — ROSUVASTATIN CALCIUM 20 MG PO TABS
40.0000 mg | ORAL_TABLET | Freq: Every day | ORAL | Status: DC
  Administered 2023-10-08 – 2023-10-13 (×6): 40 mg via ORAL
  Filled 2023-10-07 (×6): qty 2

## 2023-10-07 MED ORDER — VECURONIUM BROMIDE 10 MG IV SOLR
INTRAVENOUS | Status: DC | PRN
  Administered 2023-10-07: 2 mg via INTRAVENOUS

## 2023-10-07 MED ORDER — PHENYLEPHRINE 80 MCG/ML (10ML) SYRINGE FOR IV PUSH (FOR BLOOD PRESSURE SUPPORT)
PREFILLED_SYRINGE | INTRAVENOUS | Status: DC | PRN
  Administered 2023-10-07 (×5): 160 ug via INTRAVENOUS
  Administered 2023-10-07: 80 ug via INTRAVENOUS
  Administered 2023-10-07: 240 ug via INTRAVENOUS
  Administered 2023-10-07: 160 ug via INTRAVENOUS
  Administered 2023-10-07: 80 ug via INTRAVENOUS

## 2023-10-07 MED ORDER — ONDANSETRON HCL 4 MG/2ML IJ SOLN
INTRAMUSCULAR | Status: AC
  Filled 2023-10-07: qty 2

## 2023-10-07 MED ORDER — SODIUM CHLORIDE 0.9% FLUSH: 3.0000 mL | Freq: Two times a day (BID) | INTRAVENOUS | Status: DC

## 2023-10-07 MED ORDER — SUGAMMADEX SODIUM 200 MG/2ML IV SOLN
INTRAVENOUS | Status: AC
  Filled 2023-10-07: qty 2

## 2023-10-07 MED ORDER — 0.9 % SODIUM CHLORIDE (POUR BTL) OPTIME
TOPICAL | Status: DC | PRN
  Administered 2023-10-07: 5000 mL

## 2023-10-07 MED ORDER — VECURONIUM BROMIDE 10 MG IV SOLR
INTRAVENOUS | Status: AC
  Filled 2023-10-07: qty 10

## 2023-10-07 MED FILL — Lidocaine HCl Local Preservative Free (PF) Inj 2%: INTRAMUSCULAR | Qty: 14 | Status: AC

## 2023-10-07 MED FILL — Potassium Chloride Inj 2 mEq/ML: INTRAVENOUS | Qty: 40 | Status: AC

## 2023-10-07 MED FILL — Heparin Sodium (Porcine) Inj 1000 Unit/ML: Qty: 1000 | Status: AC

## 2023-10-07 SURGICAL SUPPLY — 77 items
ADAPTER CARDIO PERF ANTE/RETRO (ADAPTER) IMPLANT
ANTIFOG SOL W/FOAM PAD STRL (MISCELLANEOUS) ×3 IMPLANT
BAG DECANTER FOR FLEXI CONT (MISCELLANEOUS) ×4 IMPLANT
BLADE CLIPPER SURG (BLADE) ×4 IMPLANT
BLADE STERNUM SYSTEM 6 (BLADE) ×4 IMPLANT
BLADE SURG 11 STRL SS (BLADE) IMPLANT
BLADE SURG 15 STRL LF DISP TIS (BLADE) IMPLANT
CANISTER SUCT 3000ML PPV (MISCELLANEOUS) ×4 IMPLANT
CANN PRFSN 3/8XCNCT ST RT ANG (MISCELLANEOUS) ×3 IMPLANT
CANNULA NON VENT 20FR 12 (CANNULA) ×4 IMPLANT
CANNULA NON VENT 22FR 12 (CANNULA) ×4 IMPLANT
CANNULA PRFSN 3/8XCNCT RT ANG (MISCELLANEOUS) IMPLANT
CANNULA SUMP PERICARDIAL (CANNULA) ×4 IMPLANT
CANNULA VRC MALB SNGL STG 36FR (MISCELLANEOUS) IMPLANT
CATH HEART VENT LEFT (CATHETERS) ×4 IMPLANT
CATH RETROPLEGIA CORONARY 14FR (CATHETERS) IMPLANT
CATH ROBINSON RED A/P 18FR (CATHETERS) ×12 IMPLANT
CATH THOR STR 32F SOFT 20 RADI (CATHETERS) ×4 IMPLANT
CATH THORACIC 28FR RT ANG (CATHETERS) ×4 IMPLANT
CLAMP ISOLATOR SYNERGY LG (MISCELLANEOUS) IMPLANT
CLAMP SUTURE YELLOW 5 PAIRS (MISCELLANEOUS) ×4 IMPLANT
DEVICE SUT CK QUICK LOAD MINI (Prosthesis & Implant Heart) IMPLANT
DRAPE CV SPLIT W-CLR ANES SCRN (DRAPES) ×4 IMPLANT
DRAPE INCISE IOBAN 66X45 STRL (DRAPES) ×4 IMPLANT
DRAPE PERI GROIN 82X75IN TIB (DRAPES) ×4 IMPLANT
DRSG AQUACEL AG ADV 3.5X10 (GAUZE/BANDAGES/DRESSINGS) ×4 IMPLANT
ELECT CAUTERY BLADE 6.4 (BLADE) ×4 IMPLANT
ELECT REM PT RETURN 9FT ADLT (ELECTROSURGICAL) ×6 IMPLANT
ELECTRODE REM PT RTRN 9FT ADLT (ELECTROSURGICAL) ×8 IMPLANT
FELT TEFLON 1X6 (MISCELLANEOUS) ×4 IMPLANT
GAUZE SPONGE 4X4 12PLY STRL (GAUZE/BANDAGES/DRESSINGS) ×4 IMPLANT
GOWN STRL REUS W/ TWL LRG LVL3 (GOWN DISPOSABLE) ×24 IMPLANT
INSERT FOGARTY XLG (MISCELLANEOUS) ×4 IMPLANT
INST SIZER PENDITURE DISP (INSTRUMENTS) ×3 IMPLANT
INSTRUMENT SIZER PENDITUR DISP (INSTRUMENTS) IMPLANT
IRRIG SUCT STRYKERFLOW 2 WTIP (MISCELLANEOUS) IMPLANT
IRRIGATION SUCT STRKRFLW 2 WTP (MISCELLANEOUS) IMPLANT
KIT BASIN OR (CUSTOM PROCEDURE TRAY) ×4 IMPLANT
KIT SUT CK MINI COMBO 4X17 (Prosthesis & Implant Heart) IMPLANT
KIT TURNOVER KIT B (KITS) ×4 IMPLANT
LINE VENT (MISCELLANEOUS) IMPLANT
NS IRRIG 1000ML POUR BTL (IV SOLUTION) ×24 IMPLANT
ORGANIZER SUTURE GABBAY-FRATER (MISCELLANEOUS) ×4 IMPLANT
PACK E OPEN HEART (SUTURE) ×4 IMPLANT
PACK OPEN HEART (CUSTOM PROCEDURE TRAY) ×4 IMPLANT
PAD ARMBOARD POSITIONER FOAM (MISCELLANEOUS) ×8 IMPLANT
PAD ELECT DEFIB RADIOL ZOLL (MISCELLANEOUS) ×4 IMPLANT
PENCIL BUTTON HOLSTER BLD 10FT (ELECTRODE) ×4 IMPLANT
POSITIONER HEAD DONUT 9IN (MISCELLANEOUS) ×4 IMPLANT
SET MPS 3-ND DEL (MISCELLANEOUS) IMPLANT
SOLUTION ANTFG W/FOAM PAD STRL (MISCELLANEOUS) ×4 IMPLANT
SUT ETHIBOND 2 0 SH (SUTURE) IMPLANT
SUT ETHIBOND 2 0 SH 36X2 (SUTURE) IMPLANT
SUT ETHIBOND 3 0 SH 1 (SUTURE) IMPLANT
SUT MNCRL AB 4-0 PS2 18 (SUTURE) ×8 IMPLANT
SUT PROLENE 4 0 SH DA (SUTURE) ×12 IMPLANT
SUT PROLENE 4-0 RB1 .5 CRCL 36 (SUTURE) ×8 IMPLANT
SUT PROLENE 5 0 RB 2 (SUTURE) IMPLANT
SUT STEEL 6MS V (SUTURE) ×4 IMPLANT
SUT STEEL SZ 6 DBL 3X14 BALL (SUTURE) ×8 IMPLANT
SUT VIC AB 0 CTX36XBRD ANTBCTR (SUTURE) ×8 IMPLANT
SUT VIC AB 2-0 CT1 TAPERPNT 27 (SUTURE) ×8 IMPLANT
SYR BULB IRRIG 60ML STRL (SYRINGE) IMPLANT
SYS CLIP PENDITURE LAA 50 (Clip) ×3 IMPLANT
SYSTEM CLIP PENDITURE LAA 50 (Clip) IMPLANT
SYSTEM SAHARA CHEST DRAIN ATS (WOUND CARE) ×4 IMPLANT
TAG SUTURE CLAMP YLW 5PR (MISCELLANEOUS) ×3 IMPLANT
TAPE CLOTH SURG 4X10 WHT LF (GAUZE/BANDAGES/DRESSINGS) IMPLANT
TAPE PAPER 2X10 WHT MICROPORE (GAUZE/BANDAGES/DRESSINGS) IMPLANT
TOWEL GREEN STERILE (TOWEL DISPOSABLE) ×4 IMPLANT
TOWEL GREEN STERILE FF (TOWEL DISPOSABLE) ×4 IMPLANT
TRAY FOLEY SLVR 16FR TEMP STAT (SET/KITS/TRAYS/PACK) ×4 IMPLANT
UNDERPAD 30X36 HEAVY ABSORB (UNDERPADS AND DIAPERS) ×4 IMPLANT
VALVE ST JUDE MITRAL 29X24.2 (Prosthesis & Implant Heart) IMPLANT
VENT LEFT HEART 12002 (CATHETERS) ×3 IMPLANT
VRC MALLEABLE SINGLE STG 36FR (MISCELLANEOUS) ×3 IMPLANT
WATER STERILE IRR 1000ML POUR (IV SOLUTION) ×8 IMPLANT

## 2023-10-07 NOTE — Anesthesia Procedure Notes (Signed)
 Arterial Line Insertion Start/End3/17/2025 7:05 AM, 10/07/2023 7:10 AM Performed by: Flonnie Hailstone, CRNA, CRNA  Patient location: Pre-op. Preanesthetic checklist: patient identified, IV checked, site marked, risks and benefits discussed, surgical consent, monitors and equipment checked and pre-op evaluation Lidocaine 1% used for infiltration Left, radial was placed Catheter size: 20 G Hand hygiene performed , maximum sterile barriers used  and Seldinger technique used  Attempts: 2 Procedure performed without using ultrasound guided technique. Following insertion, dressing applied and Biopatch. Post procedure assessment: normal  Patient tolerated the procedure well with no immediate complications.

## 2023-10-07 NOTE — Interval H&P Note (Signed)
 History and Physical Interval Note:  10/07/2023 6:38 AM  Sandra Hines  has presented today for surgery, with the diagnosis of SEVERE MR AFIB.  The various methods of treatment have been discussed with the patient and family. After consideration of risks, benefits and other options for treatment, the patient has consented to  Procedure(s): REPLACEMENT, MITRAL VALVE (N/A) COX MAZE PROCEDURE (N/A) REPLACEMENT, AORTA, ASCENDING (N/A) ECHOCARDIOGRAM, TRANSESOPHAGEAL, INTRAOPERATIVE (N/A) as a surgical intervention.  The patient's history has been reviewed, patient examined, no change in status, stable for surgery.  I have reviewed the patient's chart and labs.  Questions were answered to the patient's satisfaction.     Eugenio Hoes

## 2023-10-07 NOTE — Anesthesia Procedure Notes (Signed)
 Procedure Name: Intubation Date/Time: 10/07/2023 7:47 AM  Performed by: Flonnie Hailstone, CRNAPre-anesthesia Checklist: Patient identified, Emergency Drugs available, Suction available and Patient being monitored Patient Re-evaluated:Patient Re-evaluated prior to induction Oxygen Delivery Method: Circle system utilized Preoxygenation: Pre-oxygenation with 100% oxygen Induction Type: IV induction Ventilation: Mask ventilation without difficulty Laryngoscope Size: Mac and 3 Grade View: Grade I Tube type: Oral Tube size: 7.0 mm Number of attempts: 1 Airway Equipment and Method: Stylet Placement Confirmation: ETT inserted through vocal cords under direct vision, positive ETCO2 and breath sounds checked- equal and bilateral Secured at: 22 cm Tube secured with: Tape Dental Injury: Teeth and Oropharynx as per pre-operative assessment  Comments: intubated by C. Roseana Rhine, CRNA; ebbs  cta

## 2023-10-07 NOTE — Progress Notes (Addendum)
 Patient ID: Sandra Hines, female   DOB: [birth year], 53 y.o.   MRN: [id number]     Advanced Heart Failure Rounding Note  Cardiologist: Julien Nordmann, MD  Chief Complaint: mitral regurgitation Subjective:    3/17: S/p MVR, pulmonary vein isolation MAZE and LAA occlusion 10/07/23. POD #0  Swann #s CVP not yet hooked up PA 42/27 (32) CO 4.99  CI 2.49  Currently on Neo at 15 mcg/min.   Intubated and sedated.   Objective:   Weight Range: 88.6 kg Body mass index is 30.59 kg/m.   Vital Signs:   Temp:  [98 F (36.7 C)-98.4 F (36.9 C)] 98.4 F (36.9 C) (03/16 2028) Pulse Rate:  [55-80] 80 (03/17 1125) Resp:  [3-22] 16 (03/17 1125) BP: (117-152)/(72-100) 118/72 (03/17 0715) SpO2:  [96 %-100 %] 96 % (03/17 1125) FiO2 (%):  [50 %] 50 % (03/17 1125) Weight:  [88.6 kg] 88.6 kg (03/17 0500) Last BM Date : 10/05/23  Weight change: Filed Weights   10/05/23 0450 10/06/23 0358 10/07/23 0500  Weight: 91.6 kg 90.8 kg 88.6 kg    Intake/Output:   Intake/Output Summary (Last 24 hours) at 10/07/2023 1129 Last data filed at 10/07/2023 1124 Gross per 24 hour  Intake 3674.53 ml  Output 1780 ml  Net 1894.53 ml      Physical Exam    General:  intubated and sedated HEENT: +ETT, +OG Neck: UTA JVD. Swann RIJ Cor: PMI nondisplaced. Regular rate & rhythm. No rubs, gallops or murmurs. +CT Lungs: clear GU: +foley Extremities: no cyanosis, clubbing, rash, edema  Neuro: sedated  Telemetry   Paced 80 (Personally reviewed)    Labs    CBC Recent Labs    10/06/23 0357 10/07/23 0417 10/07/23 0753 10/07/23 0947 10/07/23 0956 10/07/23 1024 10/07/23 1027  WBC 12.9* 11.7*  --   --   --   --   --   HGB 13.3 13.7   < > 10.7*   < > 8.8* 9.5*  HCT 40.8 42.5   < > 33.7*   < > 26.0* 28.0*  MCV 86.6 86.2  --   --   --   --   --   PLT 288 268  --  43*  --   --   --    < > = values in this interval not displayed.   Basic Metabolic Panel Recent Labs    02/54/27 0357 10/07/23 0417  10/07/23 0753 10/07/23 0929 10/07/23 0956 10/07/23 1024 10/07/23 1027  NA 133* 132*   < > 131*   < > 132* 132*  K 4.9 5.1   < > 6.2*   < > 5.1 5.1  CL 99 99   < > 101  --  102  --   CO2 22 21*  --   --   --   --   --   GLUCOSE 156* 145*   < > 143*  --  125*  --   BUN 21* 25*   < > 27*  --  27*  --   CREATININE 1.14* 1.40*   < > 1.60*  --  1.60*  --   CALCIUM 9.0 9.3  --   --   --   --   --    < > = values in this interval not displayed.   Liver Function Tests No results for input(s): "AST", "ALT", "ALKPHOS", "BILITOT", "PROT", "ALBUMIN" in the last 72 hours.  No results for input(s): "LIPASE", "AMYLASE" in the last  72 hours. Cardiac Enzymes No results for input(s): "CKTOTAL", "CKMB", "CKMBINDEX", "TROPONINI" in the last 72 hours.  BNP: BNP (last 3 results) Recent Labs    09/25/23 1711  BNP 700.0*    ProBNP (last 3 results) No results for input(s): "PROBNP" in the last 8760 hours.   D-Dimer No results for input(s): "DDIMER" in the last 72 hours. Hemoglobin A1C No results for input(s): "HGBA1C" in the last 72 hours. Fasting Lipid Panel No results for input(s): "CHOL", "HDL", "LDLCALC", "TRIG", "CHOLHDL", "LDLDIRECT" in the last 72 hours. Thyroid Function Tests No results for input(s): "TSH", "T4TOTAL", "T3FREE", "THYROIDAB" in the last 72 hours.  Invalid input(s): "FREET3"  Other results:  Imaging   No results found.  Medications:   Scheduled Medications:  [START ON 10/08/2023] acetaminophen  1,000 mg Oral Q6H   Or   [START ON 10/08/2023] acetaminophen (TYLENOL) oral liquid 160 mg/5 mL  1,000 mg Per Tube Q6H   acetaminophen (TYLENOL) oral liquid 160 mg/5 mL  650 mg Per Tube Once   amiodarone  400 mg Oral BID   aspirin  324 mg Oral Once   [START ON 10/08/2023] aspirin EC  325 mg Oral Daily   Or   [START ON 10/08/2023] aspirin  324 mg Per Tube Daily   [START ON 10/08/2023] bisacodyl  10 mg Oral Daily   Or   [START ON 10/08/2023] bisacodyl  10 mg Rectal Daily    [START ON 10/08/2023] busPIRone  5 mg Oral TID   chlorhexidine  15 mL Mouth/Throat NOW   [START ON 10/08/2023] docusate sodium  200 mg Oral Daily   [START ON 10/08/2023] DULoxetine  40 mg Oral Daily   metoCLOPramide (REGLAN) injection  10 mg Intravenous Q6H   metoprolol tartrate  12.5 mg Oral BID   Or   metoprolol tartrate  12.5 mg Per Tube BID   [START ON 10/09/2023] pantoprazole  40 mg Oral Daily   pantoprazole (PROTONIX) IV  40 mg Intravenous QHS   [START ON 10/08/2023] rosuvastatin  40 mg Oral Daily   [START ON 10/08/2023] sodium chloride flush  3 mL Intravenous Q12H   sodium chloride flush  3-10 mL Intravenous Q12H   sodium chloride flush  3-10 mL Intravenous Q12H   sodium chloride flush  3-10 mL Intravenous Q12H    Infusions:  sodium chloride     albumin human     dexmedetomidine (PRECEDEX) IV infusion     insulin     [START ON 10/08/2023] levofloxacin (LEVAQUIN) IV     magnesium sulfate     nitroGLYCERIN     phenylephrine (NEO-SYNEPHRINE) Adult infusion     potassium chloride     vancomycin      PRN Medications: albumin human, cyclobenzaprine, dextrose, metoprolol tartrate, midazolam, morphine injection, ondansetron (ZOFRAN) IV, oxyCODONE, [START ON 10/08/2023] sodium chloride flush, sodium chloride flush, sodium chloride flush, sodium chloride flush, traMADol  Assessment/Plan  1. Mitral regurgitation: TEE 3/12 with severe MR, rheumatic-appearing valve.  No mitral stenosis.  - S/p MVR, pulmonary vein isolation MAZE and LAA occlusion 10/07/23. POD #0 - Wean Neo as tolerated - Chest tubes per TCTS  2. Atrial fibrillation: Paroxysmal. Now in NSR on po amiodarone.  - Continue amiodarone 400 bid.  - Plan to start [facility] once CT and pacing wires out - S/p pulmonary vein isolation MAZE 10/07/23  3. 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    - Volume appears stable for now.  Will follow up on CVP.  - Stopped Entresto pre-surgery.   Currently off Bidil (BP currently stable, will reevaluate later today).  - Holding spiro for now.  4. Intubated - post-op remains intubated - PCCM following   CRITICAL CARE Performed by: Alen Bleacher  Total critical care time: 15 minutes  Critical care time was exclusive of separately billable procedures and treating other patients.  Critical care was necessary to treat or prevent imminent or life-threatening deterioration.  Critical care was time spent personally by me on the following activities: development of treatment plan with patient and/or surrogate as well as nursing, discussions with consultants, evaluation of patient's response to treatment, examination of patient, obtaining history from patient or surrogate, ordering and performing treatments and interventions, ordering and review of laboratory studies, ordering and review of radiographic studies, pulse oximetry and re-evaluation of patient's condition.   Length of Stay: 4  Alen Bleacher, NP  10/07/2023, 11:29 AM  Advanced Heart Failure Team Pager [phone] (M-F; 7a - 5p)  Please contact [facility] for night-coverage after hours (5p -7a ) and weekends on [email or web address]  Agree with above.  She is just back from the [facility] after mechanical MVR/Maze.   Remains intubated sedated. On low-dose NEO.   Ernestine Conrad numbers reviewed personally and look good. CTs dry.   Currently AV paced.   General:  Intubated sedated  HEENT: normal + ETT Neck: supple. + swan  Cor: Sternal dressing ok. Regular no rub Mechanical s1 (crisp) Lungs: clear Abdomen: soft, nontender, nondistended. Hypoactive BS Extremities: no cyanosis, clubbing, rash, edema Neuro: intubated/sedated  Stable immediately post-op with adequate hemodynamics and vent settings.   CTs are dry. Currently AV paced.   Wean Neo as tolerated. Work toward extubation. D/w CCM.   CRITICAL CARE Performed by: Arvilla Meres  Total critical care time: 42 minutes  Critical care  time was exclusive of separately billable procedures and treating other patients.  Critical care was necessary to treat or prevent imminent or life-threatening deterioration.  Critical care was time spent personally by me (independent of midlevel providers or residents) on the following activities: development of treatment plan with patient and/or surrogate as well as nursing, discussions with consultants, evaluation of patient's response to treatment, examination of patient, obtaining history from patient or surrogate, ordering and performing treatments and interventions, ordering and review of laboratory studies, ordering and review of radiographic studies, pulse oximetry and re-evaluation of patient's condition.  Arvilla Meres, MD  6:43 PM

## 2023-10-07 NOTE — Procedures (Signed)
 Extubation Procedure Note  Patient Details:   Name: Sandra Hines DOB: [birth year] MRN: [id number]   Airway Documentation:    Vent end date: 10/07/23 Vent end time: 1347   Evaluation  O2 sats: stable throughout Complications: No apparent complications Patient did tolerate procedure well. Bilateral Breath Sounds: Clear   Yes  NIF -25 VC MD at beside and notified of results. Positive cuff leak prior to extubation. Pt placed on 4L [name] 10/07/2023, 1:51 PM

## 2023-10-07 NOTE — Anesthesia Postprocedure Evaluation (Signed)
 Anesthesia Post Note  Patient: Sandra Hines  Procedure(s) Performed: MITRAL VALVE REPLACEMENT USING SJM MASTERS SERIES MECHANICAL MITRAL HEART VALVE SIZE (Chest) MAZE PROCEDURE ECHOCARDIOGRAM, TRANSESOPHAGEAL, INTRAOPERATIVE CLIPPING, LEFT ATRIAL APPENDAGE USING MEDTRONIC PENDITURE LAA EXCLUSION SYSTEM SIZE (Chest)     Patient location during evaluation: SICU Anesthesia Type: General Level of consciousness: sedated Pain management: pain level controlled Vital Signs Assessment: post-procedure vital signs reviewed and stable Respiratory status: patient remains intubated per anesthesia plan Cardiovascular status: stable Postop Assessment: no apparent nausea or vomiting Anesthetic complications: no   There were no known notable events for this encounter.  Last Vitals:  Vitals:   10/07/23 1143 10/07/23 1200  BP:    Pulse: 80 80  Resp: 18 16  Temp: (!) 35.2 C (!) 35.4 C  SpO2: 98% 99%    Last Pain:  Vitals:   10/07/23 1610  TempSrc:   PainSc: 6                  [place]

## 2023-10-07 NOTE — Consult Note (Signed)
 NAME:  Sandra Hines, MRN:  [id number], DOB:  1970/12/05, LOS: 4 ADMISSION DATE:  10/03/2023, CONSULTATION DATE:  10/07/2023 REFERRING MD: Dr. Leafy Ro, CHIEF COMPLAINT: Status post mitral valve replacement  History of Present Illness:  53 year old female who presented with shortness of breath, generalized weakness and malaise and palpitation at [facility], noted to be in hypertensive emergency she was diuresed and blood pressure was brought under control, also she was noted to be in A-fib with RVR heart rate ranging in 180s, she did not respond to Cardizem and then was given amiodarone with conversion to sinus rhythm.  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  Symptomatically she was treated, underwent cardiac cath which showed no obstructive coronary artery disease, patient was transferred to [facility], today she underwent mechanical mitral valve replacement and pulmonary vein isolation maze with occlusion of left atrial appendage.  Patient remained intubated was transferred to ICU.  PCCM was consulted for help evaluation medical management  Pertinent  Medical History  Hypertension Hyperlipidemia Tobacco dependence Cerebral aneurysm status postrepair at [facility] in 2019   Significant Hospital Events: Including procedures, antibiotic start and stop dates in addition to other pertinent events     Interim History / Subjective:  As above  Objective   Blood pressure 118/72, pulse 80, temperature 98.4 F (36.9 C), temperature source Oral, resp. rate 16, height 5\' 7"  (1.702 m), weight 88.6 kg, SpO2 98%. PAP: (8-29)/(0-12) 29/12  Vent Mode: SIMV;PSV;PRVC FiO2 (%):  [50 %] 50 % Set Rate:  [16 bmp] 16 bmp Vt Set:  [490 mL] 490 mL PEEP:  [5 cmH20] 5 cmH20 Pressure Support:  [10 cmH20] 10 cmH20   Intake/Output Summary (Last 24 hours) at 10/07/2023 1155 Last data filed at 10/07/2023 1125 Gross per 24 hour  Intake 3774.53 ml  Output 1780 ml   Net 1994.53 ml   Filed Weights   10/05/23 0450 10/06/23 0358 10/07/23 0500  Weight: 91.6 kg 90.8 kg 88.6 kg    Examination: General: Crtitically ill-appearing obese female, orally intubated HEENT: New Site/AT, eyes anicteric.  ETT and OGT in place Neuro: Sedated, not following commands.  Eyes are closed.  Pupils 3 mm bilateral reactive to light Chest: Central sternotomy incision looks clean and dry, coarse breath sounds, no wheezes or rhonchi.  Mediastinal and chest tube in place Heart: Paced rhythm, no murmurs or gallops Abdomen: Soft, nondistended, bowel sounds present Skin: No rash  Labs and images reviewed  Resolved Hospital Problem list     Assessment & Plan:  Severe mitral regurgitation is status post MVR Paroxysmal A-fib status post maze with LAA closure Continue aspirin and statin Continue amiodarone Chest tube management TCTS Continue to titrate Precedex with RASS goal 0/-1 Continue pain control with tramadol, oxycodone and morphine Closely monitor chest tube output  Acute respiratory insufficiency, postop Continue on protective ventilation VAP prevention bundle in place Rapid weaning protocol ordered is in place  Hypertension Holding antihypertensive for now, as patient is requiring low-dose phenylephrine  Hyperlipidemia Continue   Prediabetes Patient hemoglobin A1c is 5.8 Currently on insulin infusion, monitor fingerstick with goal 140-180  Expected perioperative blood loss anemia Thrombocytopenia due to CPB Monitor H/H and PLT counts Repeat platelet count is pending as it dropped down to 88-42  Obesity Diet and exercise counseling as appropriate   Best Practice (right click and "Reselect all SmartList Selections" daily)   Diet/type: NPO DVT prophylaxis: SCD GI prophylaxis: PPI Lines: Central line, Arterial  Line, and yes and it is still needed Foley:  Yes, and it is still needed Code Status:  full code Last date of multidisciplinary goals of care  discussion [Per primary team]   Labs   CBC: Recent Labs  Lab 10/03/23 0447 10/03/23 0810 10/04/23 0556 10/05/23 0449 10/06/23 0357 10/07/23 0417 10/07/23 0753 10/07/23 0929 10/07/23 0947 10/07/23 0956 10/07/23 1024 10/07/23 1027  WBC 9.8  --  10.4 10.2 12.9* 11.7*  --   --   --   --   --   --   NEUTROABS 5.6  --   --   --   --   --   --   --   --   --   --   --   HGB 11.6*   < > 12.3 13.4 13.3 13.7   < > 10.9* 10.7* 10.9* 8.8* 9.5*  HCT 35.3*   < > 39.0 42.4 40.8 42.5   < > 32.0* 33.7* 32.0* 26.0* 28.0*  MCV 85.3  --  86.7 87.4 86.6 86.2  --   --   --   --   --   --   PLT 206  --  228 266 288 268  --   --  43*  --   --   --    < > = values in this interval not displayed.    Basic Metabolic Panel: Recent Labs  Lab 10/02/23 0442 10/03/23 0447 10/03/23 0810 10/04/23 0556 10/05/23 0449 10/06/23 0357 10/07/23 0417 10/07/23 0753 10/07/23 0757 10/07/23 0836 10/07/23 0853 10/07/23 0858 10/07/23 0929 10/07/23 0956 10/07/23 1024 10/07/23 1027  NA 139 137   < > 135 138 133* 132* 135   < > 134*   < > 134* 131* 133* 132* 132*  K 3.6 4.2   < > 4.5 4.9 4.9 5.1 5.1   < > 4.6   < > 4.6 6.2* 5.4* 5.1 5.1  CL 103 105  --  104 104 99 99 101  --  103  --   --  101  --  102  --   CO2 27 22  --  23 25 22  21*  --   --   --   --   --   --   --   --   --   GLUCOSE 100* 151*  --  112* 124* 156* 145* 118*  --  129*  --   --  143*  --  125*  --   BUN 21* 19  --  12 15 21* 25* 28*  --  25*  --   --  27*  --  27*  --   CREATININE 0.91 0.85  --  1.07* 1.08* 1.14* 1.40* 1.30*  --  1.30*  --   --  1.60*  --  1.60*  --   CALCIUM 8.7* 8.6*  --  8.9 9.0 9.0 9.3  --   --   --   --   --   --   --   --   --   PHOS 3.5 3.4  --   --   --   --   --   --   --   --   --   --   --   --   --   --    < > = values in this interval not displayed.   GFR: Estimated Creatinine Clearance: 48.1 mL/min (A) (by C-G formula based on SCr  of 1.6 mg/dL (H)). Recent Labs  Lab 10/04/23 0556 10/05/23 0449  10/06/23 0357 10/07/23 0417  WBC 10.4 10.2 12.9* 11.7*    Liver Function Tests: Recent Labs  Lab 10/02/23 0442 10/03/23 0447  ALBUMIN 2.9* 2.9*   No results for input(s): "LIPASE", "AMYLASE" in the last 168 hours. No results for input(s): "AMMONIA" in the last 168 hours.  ABG    Component Value Date/Time   PHART 7.341 (L) 10/07/2023 1027   PCO2ART 41.4 10/07/2023 1027   PO2ART 349 (H) 10/07/2023 1027   HCO3 22.4 10/07/2023 1027   TCO2 24 10/07/2023 1027   ACIDBASEDEF 3.0 (H) 10/07/2023 1027   O2SAT 100 10/07/2023 1027     Coagulation Profile: No results for input(s): "INR", "PROTIME" in the last 168 hours.  Cardiac Enzymes: No results for input(s): "CKTOTAL", "CKMB", "CKMBINDEX", "TROPONINI" in the last 168 hours.  HbA1C: Hgb A1c MFr Bld  Date/Time Value Ref Range Status  07/19/2023 01:54 PM 5.8 (H) 4.8 - 5.6 % Final    Comment:             Prediabetes: 5.7 - 6.4          Diabetes: >6.4          Glycemic control for adults with diabetes: <7.0     CBG: No results for input(s): "GLUCAP" in the last 168 hours.  Review of Systems:   Unable to obtain as patient is intubated and sedated  Past Medical History:  She,  has a past medical history of Hypertension.   Surgical History:   Past Surgical History:  Procedure Laterality Date   RIGHT/LEFT HEART CATH AND CORONARY ANGIOGRAPHY N/A 10/03/2023   Procedure: RIGHT/LEFT HEART CATH AND CORONARY ANGIOGRAPHY;  Surgeon: Yvonne Kendall, MD;  Location: [facility] INVASIVE CV LAB;  Service: Cardiovascular;  Laterality: N/A;   TEE WITHOUT CARDIOVERSION N/A 10/02/2023   Procedure: ECHOCARDIOGRAM, TRANSESOPHAGEAL;  Surgeon: Antonieta Iba, MD;  Location: [facility] ORS;  Service: Cardiovascular;  Laterality: N/A;     Social History:   reports that she has been smoking cigarettes. She has never used smokeless tobacco. She reports that she does not drink alcohol and does not use drugs.   Family History:  Her family history is not  on file.   Allergies Allergies  Allergen Reactions   Penicillins Hives    Has patient had a PCN reaction causing immediate rash, facial/tongue/throat swelling, SOB or lightheadedness with hypotension: No Has patient had a PCN reaction causing severe rash involving mucus membranes or skin necrosis: No Has patient had a PCN reaction that required hospitalization: No Has patient had a PCN reaction occurring within the last 10 years: No If all of the above answers are "NO", then may proceed with Cephalosporin use.      Home Medications  Prior to Admission medications   Medication Sig Start Date End Date Taking? Authorizing Provider  acetaminophen (TYLENOL) 325 MG tablet Take 2 tablets (650 mg total) by mouth every 4 (four) hours as needed for headache or mild pain (pain score 1-3). 10/03/23  Yes Loyce Dys, MD  acetaminophen-caffeine (EXCEDRIN TENSION HEADACHE) 500-65 MG TABS per tablet Take 2 tablets by mouth every 8 (eight) hours as needed (headaches). 10/03/23  Yes Loyce Dys, MD  amiodarone (PACERONE) 400 MG tablet Take 1 tablet (400 mg total) by mouth 2 (two) times daily. 10/03/23  Yes Loyce Dys, MD  buPROPion (WELLBUTRIN SR) 150 MG 12 hr tablet 1 tablet daily for 3 days, then  1 tablet twice daily. Stop smoking 14 days after starting medication 07/19/23 11/16/23 Yes Merita Norton T, FNP  busPIRone (BUSPAR) 5 MG tablet Take 1 tablet (5 mg total) by mouth 3 (three) times daily. 07/19/23  Yes Jacky Kindle, FNP  cyclobenzaprine (FLEXERIL) 5 MG tablet Take 1 tablet (5 mg total) by mouth 3 (three) times daily as needed for muscle spasms. 10/03/23  Yes Loyce Dys, MD  DULoxetine (CYMBALTA) 20 MG capsule Take 2 capsules (40 mg total) by mouth daily. 08/15/23  Yes Simmons-Robinson, Makiera, MD  heparin 81191 UT/250ML infusion Inject 1,900 Units/hr into the vein continuous. 10/03/23  Yes Loyce Dys, MD  ipratropium-albuterol (DUONEB) 0.5-2.5 (3) MG/3ML SOLN Take 3 mLs by nebulization  every 2 (two) hours as needed. Patient taking differently: Take 3 mLs by nebulization every 2 (two) hours as needed (shortness of breath). 10/03/23  Yes Djan, Scarlette Calico, MD  LORazepam (ATIVAN) 1 MG tablet Take 1 tablet (1 mg total) by mouth every 6 (six) hours as needed for anxiety. 10/03/23  Yes Loyce Dys, MD  magnesium hydroxide (MILK OF MAGNESIA) 400 MG/5ML suspension Take 30 mLs by mouth daily as needed for mild constipation or moderate constipation. 10/03/23  Yes Loyce Dys, MD  potassium chloride SA (KLOR-CON M) 20 MEQ tablet Take 2 tablets (40 mEq total) by mouth daily. 10/03/23  Yes Loyce Dys, MD  rosuvastatin (CRESTOR) 40 MG tablet Take 1 tablet (40 mg total) by mouth daily. 07/19/23  Yes Jacky Kindle, FNP  sacubitril-valsartan (ENTRESTO) 24-26 MG Take 1 tablet by mouth 2 (two) times daily. 10/03/23  Yes Loyce Dys, MD  spironolactone (ALDACTONE) 25 MG tablet Take 0.5 tablets (12.5 mg total) by mouth daily. 10/03/23  Yes Loyce Dys, MD  traZODone (DESYREL) 50 MG tablet Take 0.5-1 tablets (25-50 mg total) by mouth at bedtime as needed for sleep. 07/19/23  Yes Jacky Kindle, FNP  furosemide (LASIX) 10 MG/ML injection Inject 8 mLs (80 mg total) into the vein 2 (two) times daily. Patient not taking: Reported on 10/03/2023 10/03/23   Loyce Dys, MD  hydrALAZINE (APRESOLINE) 20 MG/ML injection Inject 0.5 mLs (10 mg total) into the vein every 20 (twenty) minutes as needed (high blood pressure). Patient not taking: Reported on 10/03/2023 10/03/23   Loyce Dys, MD  labetalol (NORMODYNE) 5 MG/ML injection Inject 2 mLs (10 mg total) into the vein every 10 (ten) minutes as needed. Patient not taking: Reported on 10/03/2023 10/03/23   Loyce Dys, MD  Morphine Sulfate (MORPHINE, PF,) 2 MG/ML injection Inject 1 mL (2 mg total) into the vein every 2 (two) hours as needed. Patient not taking: Reported on 10/03/2023 10/03/23   Loyce Dys, MD  ondansetron [facility]) 4 MG/2ML SOLN  injection Inject 2 mLs (4 mg total) into the vein every 6 (six) hours as needed for nausea. Patient not taking: Reported on 10/03/2023 10/03/23   Loyce Dys, MD     Critical care time:     The patient is critically ill due to severe mitral regurgitation status post mitral valve replacement/acute respiratory insufficiency.  Critical care was necessary to treat or prevent imminent or life-threatening deterioration.  Critical care was time spent personally by me on the following activities: development of treatment plan with patient and/or surrogate as well as nursing, discussions with consultants, evaluation of patient's response to treatment, examination of patient, obtaining history from patient or surrogate, ordering and performing treatments and interventions, ordering  and review of laboratory studies, ordering and review of radiographic studies, pulse oximetry, re-evaluation of patient's condition and participation in multidisciplinary rounds.   During this encounter critical care time was devoted to patient care services described in this note for 36 minutes.     Cheri Fowler, MD [facility] See Amion for pager If no response to pager, please call [phone] until 7pm After 7pm, Please call E-link [phone]

## 2023-10-07 NOTE — Brief Op Note (Signed)
 10/03/2023 - 10/07/2023  10:19 AM  PATIENT:  Sandra Hines  53 y.o. female  PRE-OPERATIVE DIAGNOSIS:  SEVERE MITRAL REGURGITATION, ATRIAL FIBRILLATION  POST-OPERATIVE DIAGNOSIS:  SEVERE MITRAL REGURGITATION, ATRIAL FIBRILLATION  PROCEDURE:   MITRAL VALVE REPLACEMENT USING SJM MASTERS SERIES MECHANICAL MITRAL HEART VALVE SIZE   PULMONARY VEIN ISOLATION MAZE PROCEDURE    CLIPPING, LEFT ATRIAL APPENDAGE USING MEDTRONIC PENDITURE LAA EXCLUSION SYSTEM SIZE  ECHOCARDIOGRAM, TRANSESOPHAGEAL, INTRAOPERATIVE   SURGEON:   Eugenio Hoes, MD   PHYSICIAN ASSISTANT: Shreyas Piatkowski  ASSISTANTS: Velvet Bathe, RN, Scrub Person         Price, Britta Mccreedy, RN, RN First Assistant   ANESTHESIA:   general  EBL:   BLOOD ADMINISTERED:none  DRAINS:  mediastinal drains    LOCAL MEDICATIONS USED:  NONE  SPECIMEN:  Anterior mitral valve leaflet  DISPOSITION OF SPECIMEN:  PATHOLOGY  COUNTS:  correct  DICTATION: .Dragon Dictation  PLAN OF CARE: Admit to inpatient   PATIENT DISPOSITION:  ICU - intubated and hemodynamically stable.   Delay start of Pharmacological VTE agent (>24hrs) due to surgical blood loss or risk of bleeding: yes

## 2023-10-07 NOTE — Discharge Summary (Signed)
 [address]       [place] [address]             [phone]    Physician Discharge Summary  Patient ID: JALEIGH MCCROSKEY MRN: [id number] DOB/AGE: 01-19-1971 53 y.o.  Admit date: 10/03/2023 Discharge date: 10/14/2023  Admission Diagnoses:  Patient Active Problem List   Diagnosis Date Noted   S/P MVR (mitral valve replacement) 10/07/2023   Acute exacerbation of CHF (congestive heart failure) (HCC) 10/03/2023   Acute diastolic heart failure (HCC) 09/29/2023   Severe mitral valve regurgitation 09/28/2023   Atrial fibrillation with rapid ventricular response (HCC) 09/25/2023   Acute CHF (congestive heart failure) (HCC) 09/25/2023   Essential hypertension 09/25/2023   Anxiety and depression 09/25/2023   Dyslipidemia 09/25/2023   Acute bronchitis due to other specified organisms 09/05/2023   Mood disorder (HCC) 07/20/2023   Arthritis 07/20/2023   Annual physical exam 07/19/2023   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 07/19/2023   Positive screening for depression on 9-item Patient Health Questionnaire (PHQ-9) 07/19/2023   Establishing care with new doctor, encounter for 07/19/2023   Moderate tobacco dependence 07/19/2023   Encounter for screening for HIV 07/19/2023   Encounter for hepatitis C screening test for low risk patient 07/19/2023   Primary hypertension 07/19/2023   Screen for colon cancer 07/19/2023   Screening mammogram for breast cancer 07/19/2023   Screening for lung cancer 07/19/2023   BMI 32.0-32.9,adult 07/19/2023   Perimenopausal symptom 07/19/2023     Discharge Diagnoses:  Patient Active Problem List   Diagnosis Date Noted   S/P MVR (mitral valve replacement) 10/07/2023   Acute exacerbation of CHF (congestive heart failure) (HCC) 10/03/2023   Acute diastolic heart failure (HCC) 09/29/2023   Severe mitral valve regurgitation 09/28/2023   Atrial fibrillation with rapid  ventricular response (HCC) 09/25/2023   Acute CHF (congestive heart failure) (HCC) 09/25/2023   Essential hypertension 09/25/2023   Anxiety and depression 09/25/2023   Dyslipidemia 09/25/2023   Acute bronchitis due to other specified organisms 09/05/2023   Mood disorder (HCC) 07/20/2023   Arthritis 07/20/2023   Annual physical exam 07/19/2023   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 07/19/2023   Positive screening for depression on 9-item Patient Health Questionnaire (PHQ-9) 07/19/2023   Establishing care with new doctor, encounter for 07/19/2023   Moderate tobacco dependence 07/19/2023   Encounter for screening for HIV 07/19/2023   Encounter for hepatitis C screening test for low risk patient 07/19/2023   Primary hypertension 07/19/2023   Screen for colon cancer 07/19/2023   Screening mammogram for breast cancer 07/19/2023   Screening for lung cancer 07/19/2023   BMI 32.0-32.9,adult 07/19/2023   Perimenopausal symptom 07/19/2023     Discharged Condition: Stable  HPI: This is a 53 year old female with a past medical history of essential hypertension, hyperlipidemia, tobacco abuse, cerebral aneurysm (repaired at [facility] in 2019) who presented to [facility] on 09/25/2023 with acute onset of palpitations, shortness of breath, generalized weakness and malaise, and cough (upper URI symptoms not improving). She was found to be in a hypertensive emergency (BP 211/154),and atrial fibrillation with RVR. EKG showed suspected atrial fibrillation with a rate of 184 with repolarization abnormality. Chest x ray showed mild cardiomegaly with mild central pulmonary vascular congestion. Cardiology was consulted.She was put on a Cardizem drip initially but due to lack of response, was then placed on Amiodarone drip. She was  given Bisoprolol and Amlodipine for BP control. Lipid profile was done and she was restarted on Crestor. She  did require bi pap briefly then HFNC. She was treated with Tessalon Perles antibiotics, steroids, and nebs for possible bronchitis/early PNA. She was diuresed with Lasix. 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  She was put on a Heparin drip for atrial fibrillation. Echocardiogram done 09/26/2023 showed LVEF 45-50%, severe mitral regurgitation (mean MV gradient is 11 mmHg), mitral valve is rheumatic, mild to moderate TR, no AI/AS. She converted to sinus rhythm and was transitioned to oral Amiodarone. Bisoprolol bid was continued. Cardiac catheterization was then done 10/03/2023 showed no significant atherosclerotic coronary artery disease. Large v waves on PCEP consistent with her severe mitral regurgitation. She was then transferred to [facility] for further evaluation and treatment on 10/03/2023. Cardiothoracic surgery (Dr. Leafy Ro) has been consulted for consideration of mitral valve repair/replacement. Dr. Leafy Ro discussed the need for median sternotomy for mitral valve replacement (using a mechanical mitral valve), PVI Maze, and consideration of replacement of ascending thoracic aorta (ATAA).  Hospital Course: Patient underwent a median sternotomy for mitral valve replacement (mechanical) and PVI MAZE. The aortic valve dilation was not severe enough to warrant repair. Following the procedure he separated from cardiopulmonary bypass without difficulty and was transferred to the ICU in stable condition. She was extubated early the afternoon of surgery. SH was initially AAI paced (underlying sinus with bigeminy).  She was weaned off Neo Synephrine drip. Theone Murdoch and a line were removed on post op day one. Chest  tubes and foley were removed on post op day 2. She had a fib with RVR on admission. She was not initially given Amiodarone or Lopressor  post op because of sinus arrest/atrial bigeminy. She has acute on chronic heart failure. Advanced heart failure followed her post op and gave her Lasix IV and Bidil 1/2 tablet tid 03/18.  This is not able to be advanced to full GDMT but this will be pursued as an outpatient.  She does have borderline diabetes and sugars have been maintained in good control using standard measures.  Will require close ongoing outpatient management.  Potassium was up to 5.6 03/19 so she was given [facility] and recheck of BMET in the afternoon. She was started on low dose Coumadin. PT/INR were monitored daily. She was not started on Lovenox as she had thrombocytopenia post op. Platelets went as low as 71,000 but over the next several day increased to normal. She went into a fib with CVR so she was started on oral Amiodarone 200 mg bid. EP was asked to evaluate as she had paroxysms of atrial fibrillation with bradycardia when she converts.  These bradycardia events subsided over time and it is not felt that she will require a pacemaker.  She had moderate hyponatremia post op. Sodium went as low as 128.  She has received a course of diuretics but will not require ongoing diuresis at discharge.  She was started on IV Ceftriaxone for possible bronchitis (leukocytosis and cough).  Leukocytosis improved over time as did her symptoms.  She was felt stable for transfer from the ICU to 4E for further convalescence on 03/20.  She has continued to make ongoing improvements in her physical recovery.  Incisions are noted to be healing well without evidence of infection.  Oxygen has been weaned and she maintains good saturations on room air.  INR is therapeutic at 2.7.  This is expected acute blood loss anemia which is stabilized and she is on iron supplementation.  Overall, at the time of discharge she is felt to be  quite stable.  Consults: pulmonary/intensive care  Significant Diagnostic Studies:   DG Chest 2 View Result Date: 10/11/2023 CLINICAL DATA:  [id number] S/P MVR (mitral valve replacement) [id number]. EXAM: CHEST - 2 VIEW COMPARISON:  10/09/2023. FINDINGS: Low lung volume. Re-demonstration of left retrocardiac airspace opacity obscuring the left hemidiaphragm, descending thoracic aorta and blunting the left lateral costophrenic angle, suggesting combination of left lung atelectasis and/or consolidation with pleural effusion. There is probable mild interval progression since the prior study. Right lung and right lateral costophrenic angle are grossly clear. No pneumothorax on either side. Stable cardio-mediastinal silhouette. Stable widening of the superior mediastinum. Left atrial appendage closure device and prosthetic mitral valve noted. There are sternotomy wires. No acute osseous abnormalities. The soft tissues are within normal limits. IMPRESSION: Mild interval progression of left retrocardiac opacity, as described above. Electronically Signed   By: Jules Schick M.D.   On: 10/11/2023 07:58   DG Chest Port 1 View Result Date: 10/09/2023 CLINICAL DATA:  Status post mitral valve repair. EXAM: PORTABLE CHEST 1 VIEW COMPARISON:  Chest radiograph dated 10/08/2023. FINDINGS: Interval removal of the Swan-Ganz catheter. Right IJ catheter with tip over central SVC. Mediastinal drain in similar position. Stable cardiac silhouette. Similar degree vascular congestion. Slight improved aeration of the lungs. Left lung base atelectasis. Small bilateral pleural effusions. No pneumothorax. Median sternotomy wires. Mitral valve replacement. No acute osseous pathology. IMPRESSION: 1. Slight improved aeration of the lungs. 2. Small bilateral pleural effusions. Electronically Signed   By: Elgie Collard M.D.   On: 10/09/2023 09:56   DG Chest Port 1 View Result Date: 10/08/2023 CLINICAL DATA:  Status post mitral valve  replacement. EXAM: PORTABLE CHEST 1 VIEW COMPARISON:  Chest radiograph dated 10/07/2023. FINDINGS: Interval removal of endotracheal and enteric tubes. Right IJ Swan-Ganz catheter with tip over the right hilum. Cardiomegaly with vascular congestion slightly progressed since the prior radiograph. Small bilateral pleural effusions suspected. No pneumothorax. Median sternotomy wires and mitral bowel repair. No acute osseous pathology. IMPRESSION: Cardiomegaly with vascular congestion and small bilateral pleural effusions. Electronically Signed   By: Elgie Collard M.D.   On: 10/08/2023 09:16   DG Chest Port 1 View Result Date: 10/07/2023 CLINICAL DATA:  Status post mitral valve replacement EXAM: PORTABLE CHEST 1 VIEW COMPARISON:  X-ray 09/28/2023 and older FINDINGS: Status post median sternotomy. Prosthetic mitral valve. Atrial occlusion clip. Numerous tubes and lines. ET tube seen with tip proximally 4 cm above the carina. Mediastinal drains. Enteric tube extends beneath the diaphragm. Right IJ Swan-Ganz catheter tip overlying the right pulmonary artery. Enlarged cardiopericardial silhouette with some central vascular congestion. No pneumothorax or effusion. Underinflation. Overlapping cardiac leads. IMPRESSION: Acute surgical changes of mitral valve repair. Sternal wires. Numerous tubes and lines. Underinflation with enlarged heart and vascular congestion. No pneumothorax. Electronically Signed   By: Karen Kays M.D.   On: 10/07/2023 13:36   ECHO INTRAOPERATIVE TEE Result Date: 10/07/2023  *INTRAOPERATIVE TRANSESOPHAGEAL REPORT *  Patient Name:   LIANETTE BROUSSARD   Date of Exam: 10/07/2023 Medical Rec #:  [id number]      Height:       67.0 in Accession #:  [id number]     Weight:       195.3 lb Date of Birth:  [birth year]     BSA:          2.00 m Patient Age:    52 years       BP:           118/72 mmHg Patient Gender: F              HR:           56 bpm. Exam Location:  Anesthesiology Transesophogeal exam was  perform intraoperatively during surgical procedure. Patient was closely monitored under general anesthesia during the entirety of examination. Indications:     Rheumatic mitral insufficiency Sonographer:     Irving Burton Senior RDCS Performing Phys: Anice Paganini DO Diagnosing Phys: Anice Paganini DO PROCEDURE: Intraoperative Transesophogeal 3D imaging performed to better assess valves. Complications: No known complications during this procedure. POST-OP IMPRESSIONS _ Left Ventricle: The left ventricular function is normal without inotropic support. _ Right Ventricle: The right ventricular function is mildly reduced without inotropic support. _ Aorta: The aorta appears unchanged from pre-bypass. There is no dissection. _ Left Atrial Appendage: The left atrial appendage appears appropriately occluded. There is no residual flow visualized with color flow doppler. _ Aortic Valve: The aortic valve appears unchanged from pre-bypass. Trace AI. _ Mitral Valve: There is a mechanical valve in the mitral position. The valve is well seated with no paravalvular leak. Trace intravalvular regurgitation jets seen consistant with normal washing jets. Mean pressure gradient 5 mmHg. _ Tricuspid Valve: The tricuspid valve appears unchanged from pre-bypass. Trace TR. _ Pulmonic Valve: The pulmonic valve appears unchanged from pre-bypass. Trace PI. _ Pericardium: There is no pericardial effusion. PRE-OP FINDINGS  Left Ventricle: The left ventricle has normal systolic function, with an ejection fraction of 55-60%. The cavity size was mildly dilated. Right Ventricle: The right ventricle has mildly reduced systolic function. The cavity was moderately dialated. Left Atrium: No left atrial/left atrial appendage thrombus was detected.  Interatrial Septum: No atrial level shunt detected by color flow Doppler. There is no evidence of a patent foramen ovale. Pericardium: There is no evidence of pericardial effusion. There is no pleural effusion. Mitral Valve:  The mitral valve is rheumatic. Significant restriction of both the posterior and anterior mitral leaflets. Copatation point well below annulus, with notable coaptation defect. Multiple regurgitation jets. Mitral valve regurgitation is severe by color flow Doppler. Tricuspid Valve: The tricuspid valve was normal in structure. Tricuspid valve regurgitation is trivial by color flow Doppler and limitted to PA catheter site. Tricuspid annulus is dilated to 4.42cm Aortic Valve: The aortic valve is tricuspid. Aortic valve regurgitation is trivial by color flow Doppler. There is no stenosis of the aortic valve, with a calculated valve area of 2.03 cm. Pulmonic Valve: The pulmonic valve was normal in structure. Pulmonic valve regurgitation is trivial by color flow Doppler. Aorta: Ascending aorta measures 3.9cm in diameter. Aortic root measures 3.7cm +--------------+--------++ LEFT VENTRICLE          +----------------+---------++ +--------------+--------++  Diastology                PLAX 2D                 +----------------+---------++ +--------------+--------++  LV e' lateral:  8.97 cm/s LVOT diam:    2.20 cm   +----------------+---------++ +--------------+--------++  LV E/e' lateral:10.3      LVOT Area:    3.80 cm  +----------------+---------++ +--------------+--------++                        +--------------+--------++ +---------------+------+-------+  RIGHT VENTRICLE              +---------------+------+-------+ TAPSE (M-mode):1.6 cm2.37 cm +---------------+------+-------+ +------------------+------------++ AORTIC VALVE                   +------------------+------------++ AV Area (Vmax):   2.12 cm     +------------------+------------++ AV Area (Vmean):  2.12 cm     +------------------+------------++ AV Area (VTI):    2.03 cm     +------------------+------------++ AV Vmax:          160.50 cm/s  +------------------+------------++ AV Vmean:         100.550  cm/s +------------------+------------++ AV VTI:           0.269 m      +------------------+------------++ AV Peak Grad:     10.3 mmHg    +------------------+------------++ AV Mean Grad:     5.5 mmHg     +------------------+------------++ LVOT Vmax:        89.65 cm/s   +------------------+------------++ LVOT Vmean:       56.000 cm/s  +------------------+------------++ LVOT VTI:         0.144 m      +------------------+------------++ LVOT/AV VTI ratio:0.54         +------------------+------------++ AR PHT:           624 msec     +------------------+------------++  +--------------+-------++ AORTA                 +--------------+-------++ Ao Sinus diam:3.05 cm +--------------+-------++ Ao STJ diam:  2.9 cm  +--------------+-------++ +--------------+----------++     +---------------+-----------++ MITRAL VALVE                 TRICUSPID VALVE            +--------------+----------++     +---------------+-----------++ MV Area (PHT):2.24 cm       TR Peak grad:  12.8 mmHg   +--------------+----------++     +---------------+-----------++ MV Peak grad: 13.0 mmHg      TR Vmax:       179.00 cm/s +--------------+----------++     +---------------+-----------++ MV Mean grad: 5.0 mmHg   +--------------+----------++     +--------------+-------+ MV Vmax:      1.81 m/s       SHUNTS                +--------------+----------++     +--------------+-------+ MV Vmean:     104.0 cm/s     Systemic VTI: 0.14 m  +--------------+----------++     +--------------+-------+ MV VTI:       0.39 m         Systemic Diam:2.20 cm +--------------+----------++     +--------------+-------+ MV PHT:       98.02 msec +--------------+----------++ MV Decel Time:338 msec   +--------------+----------++ +----------------+-----------++ MR Peak grad:   91.4 mmHg   +----------------+-----------++ MR Mean grad:   53.0 mmHg   +----------------+-----------++ MR  Vmax:        478.00 cm/s +----------------+-----------++ MR Vmean:       340.5 cm/s  +----------------+-----------++ MR PISA:        2.26 cm    +----------------+-----------++ MR PISA Eff ROA:16 mm      +----------------+-----------++ MR PISA Radius: 0.60 cm     +----------------+-----------++ +--------------+----------++ MV E velocity:92.00 cm/s +--------------+----------++ MV A velocity:24.70 cm/s +--------------+----------++ MV E/A ratio: 3.72       +--------------+----------++  Anice Paganini Electronically signed by Anice Paganini Signature Date/Time: 10/07/2023/1:22:15 PM    Final    VAS US CAROTID  Result Date: 10/06/2023 Carotid Arterial Duplex Study Patient Name:  DILLYN JOAQUIN  Date of Exam:   10/05/2023 Medical Rec #: [id number]     Accession #:    [id number] Date of Birth: May 27, 1971    Patient Gender: F Patient Age:   52 years Exam Location:  [facility] Procedure:      VAS US CAROTID Referring Phys: Eugenio Hoes --------------------------------------------------------------------------------  Indications:       Pre operative mitral valve replacement, pulmonary vein                    isolation maze procedure and 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 Other Factors:     Atrial fibrillation, CHF. Comparison Study:  No prior study on file Performing Technologist: Sherren Kerns RVS  Examination Guidelines: A complete evaluation includes B-mode imaging, spectral Doppler, color Doppler, and power Doppler as needed of all accessible portions of each vessel. Bilateral testing is considered an integral part of a complete examination. Limited examinations for reoccurring indications may be performed as noted.  Right Carotid Findings: +----------+--------+--------+--------+------------------+--------+           PSV cm/sEDV cm/sStenosisPlaque DescriptionComments  +----------+--------+--------+--------+------------------+--------+ CCA Prox  69      12              heterogenous               +----------+--------+--------+--------+------------------+--------+ CCA Distal71      12              heterogenous               +----------+--------+--------+--------+------------------+--------+ ICA Prox  71      17              homogeneous                +----------+--------+--------+--------+------------------+--------+ ICA Mid   36      14                                         +----------+--------+--------+--------+------------------+--------+ ICA Distal52      19                                         +----------+--------+--------+--------+------------------+--------+ ECA       96      12                                         +----------+--------+--------+--------+------------------+--------+ +----------+--------+-------+--------+-------------------+           PSV cm/sEDV cmsDescribeArm Pressure (mmHG) +----------+--------+-------+--------+-------------------+ [id number]                                        +----------+--------+-------+--------+-------------------+ +---------+--------+--+--------+--+ VertebralPSV cm/s44EDV cm/s15 +---------+--------+--+--------+--+  Left Carotid Findings: +----------+--------+--------+--------+------------------+------------------+           PSV cm/sEDV cm/sStenosisPlaque DescriptionComments           +----------+--------+--------+--------+------------------+------------------+ CCA Prox  98      23                                intimal thickening +----------+--------+--------+--------+------------------+------------------+ CCA Distal72      21              homogeneous                          +----------+--------+--------+--------+------------------+------------------+ ICA Prox  39      14              homogeneous                           +----------+--------+--------+--------+------------------+------------------+ ICA Mid   38      17                                                   +----------+--------+--------+--------+------------------+------------------+ ICA Distal44      15                                tortuous           +----------+--------+--------+--------+------------------+------------------+ ECA       90      14                                                   +----------+--------+--------+--------+------------------+------------------+ +----------+--------+--------+--------+-------------------+           PSV cm/sEDV cm/sDescribeArm Pressure (mmHG) +----------+--------+--------+--------+-------------------+ [id number]                                         +----------+--------+--------+--------+-------------------+ +---------+--------+--+--------+--+ VertebralPSV cm/s87EDV cm/s23 +---------+--------+--+--------+--+   Summary: Right Carotid: The extracranial vessels were near-normal with only minimal wall                thickening or plaque. Left Carotid: The extracranial vessels were near-normal with only minimal wall               thickening or plaque. Vertebrals:  Bilateral vertebral arteries demonstrate antegrade flow. Subclavians: Normal flow hemodynamics were seen in bilateral subclavian              arteries. *See table(s) above for measurements and observations.  Electronically signed by Heath Lark on 10/06/2023 at 2:06:33 PM.    Final    CARDIAC CATHETERIZATION Result Date: 10/03/2023 Conclusions: No angiographically significant atherosclerotic coronary artery disease.  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 Moderately elevated left heart, right heart, and pulmonary artery pressures.  Large v-waves observed on PCWP tracing consistent with the patient's severe mitral regurgitation. Low normal to mildly reduced Fick cardiac output/index. Recommendations: Escalate  diuresis and consider consultation with advanced heart failure team for optimization of acute diastolic heart failure in the setting of severe mitral valve regurgitation. Cardiac surgery consultation for mitral valve replacement.  Response to medical therapy will dictate if this can be done as an  outpatient or if the patient will need transfer to [facility] for inpatient evaluation. Resume heparin infusion 2 hours after TR band removal. Primary prevention of coronary artery disease. Yvonne Kendall, MD [facility]  ECHO TEE Result Date: 10/03/2023    TRANSESOPHOGEAL ECHO REPORT   Patient Name:   IZZY DOUBEK Date of Exam: 10/02/2023 Medical Rec #:  [id number]    Height:       67.0 in Accession #:    [id number]   Weight:       197.8 lb Date of Birth:  1970-11-03   BSA:          2.013 m Patient Age:    52 years     BP:           121/88 mmHg Patient Gender: F            HR:           59 bpm. Exam Location:  [facility] Procedure: 3D Echo, Transesophageal Echo, Cardiac Doppler, Color Doppler and            Saline Contrast Bubble Study (Both Spectral and Color Flow Doppler            were utilized during procedure). Indications:     Severe Mitral valve regurgitation  History:         Patient has prior history of Echocardiogram examinations, most                  recent 10/01/2023. Risk Factors:Hypertension.  Sonographer:     Cristela Blue Referring Phys:  [id number] [facility] L CAREY Diagnosing Phys: Julien Nordmann MD PROCEDURE: After discussion of the risks and benefits of a TEE, an informed consent was obtained from the patient. TEE procedure time was 30 minutes. The transesophogeal probe was passed without difficulty through the esophogus of the patient. Imaged were obtained with the patient in a left lateral decubitus position. Local oropharyngeal anesthetic was provided with Cetacaine and viscous lidocaine. Sedation performed by different physician. The patient was monitored while under deep sedation. Image quality was  excellent. The patient's vital signs; including heart rate, blood pressure, and oxygen saturation; remained stable throughout the procedure. The patient developed no complications during the procedure.  IMPRESSIONS  1. Left ventricular ejection fraction, by estimation, is 55 to 60%. The left ventricle has normal function. The left ventricle has no regional wall motion abnormalities.  2. Right ventricular systolic function is low normal. The right ventricular size is normal.  3. The mitral valve is rheumatic, both anterior and posterior leaflets involved. Severe mitral valve regurgitation, central and eccentric jet noted. No evidence of mitral stenosis.  4. The aortic valve is normal in structure. Aortic valve regurgitation is not visualized. No aortic stenosis is present.  5. The inferior vena cava is normal in size with greater than 50% respiratory variability, suggesting right atrial pressure of 3 mmHg.  6. No left atrial/left atrial appendage thrombus was detected.  7. 3D performed of the mitral valve and demonstrates rheumatic mitral valve. Conclusion(s)/Recommendation(s): hemodynamically significant mitral valvular heart disease as detailed above. FINDINGS  Left Ventricle: Left ventricular ejection fraction, by estimation, is 55 to 60%. The left ventricle has normal function. The left ventricle has no regional wall motion abnormalities. The left ventricular internal cavity size was normal in size. There is  no left ventricular hypertrophy. Right Ventricle: The right ventricular size is normal. No increase in right ventricular wall thickness. Right ventricular systolic function is low normal. Left  Atrium: Left atrial size was normal in size. No left atrial/left atrial appendage thrombus was detected. Right Atrium: Right atrial size was normal in size. Pericardium: There is no evidence of pericardial effusion. Mitral Valve: The mitral valve is rheumatic. There is moderate thickening of the mitral valve  leaflet(s). There is mild calcification of the mitral valve leaflet(s). Severe mitral valve regurgitation. No evidence of mitral valve stenosis. Tricuspid Valve: The tricuspid valve is normal in structure. Tricuspid valve regurgitation is not demonstrated. No evidence of tricuspid stenosis. Aortic Valve: The aortic valve is normal in structure. Aortic valve regurgitation is not visualized. No aortic stenosis is present. Pulmonic Valve: The pulmonic valve was normal in structure. Pulmonic valve regurgitation is not visualized. No evidence of pulmonic stenosis. Aorta: The aortic root is normal in size and structure. Venous: The inferior vena cava is normal in size with greater than 50% respiratory variability, suggesting right atrial pressure of 3 mmHg. IAS/Shunts: No atrial level shunt detected by color flow Doppler. Agitated saline contrast was given intravenously to evaluate for intracardiac shunting. Additional Comments: 3D was performed not requiring image post processing on an independent workstation and was abnormal. MR Peak grad:    116.2 mmHg MR Mean grad:    65.0 mmHg MR Vmax:         539.00 cm/s MR Vmean:        363.0 cm/s MR PISA:         3.08 cm MR PISA Eff ROA: 51 mm MR PISA Radius:  0.70 cm Julien Nordmann MD Electronically signed by Julien Nordmann MD Signature Date/Time: 10/02/2023/5:56:00 PM    Final (Updated)    ECHOCARDIOGRAM LIMITED Result Date: 10/01/2023    ECHOCARDIOGRAM LIMITED REPORT   Patient Name:   SEMIRA STOLTZFUS Date of Exam: 10/01/2023 Medical Rec #:  [id number]    Height:       67.0 in Accession #:    [id number]   Weight:       211.0 lb Date of Birth:  1971/07/20   BSA:          2.069 m Patient Age:    52 years     BP:           123/92 mmHg Patient Gender: F            HR:           58 bpm. Exam Location:  [facility] Procedure: Limited Echo, Cardiac Doppler and Color Doppler (Both Spectral and            Color Flow Doppler were utilized during procedure). Indications:     Mitral valve disorder  I05.9  History:         Patient has prior history of Echocardiogram examinations, most                  recent 09/27/2023. Risk Factors:Hypertension.  Sonographer:     Cristela Blue Referring Phys:  [id number] CADENCE H FURTH Diagnosing Phys: Yvonne Kendall MD IMPRESSIONS  1. Left ventricular ejection fraction, by estimation, is 55 to 60%. The left ventricle has normal function. The left ventricular internal cavity size was severely dilated. There is mild left ventricular hypertrophy.  2. Left atrial size was severely dilated.  3. 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 No evidence of mitral stenosis.  4. The aortic valve is tricuspid. Aortic valve regurgitation is trivial.  5. There is normal pulmonary artery systolic pressure.  6. The inferior vena cava is normal  in size with greater than 50% respiratory variability, suggesting right atrial pressure of 3 mmHg. FINDINGS  Left Ventricle: Left ventricular ejection fraction, by estimation, is 55 to 60%. The left ventricle has normal function. The left ventricular internal cavity size was severely dilated. There is mild left ventricular hypertrophy. Right Ventricle: There is normal pulmonary artery systolic pressure. The tricuspid regurgitant velocity is 1.89 m/s, and with an assumed right atrial pressure of 3 mmHg, the estimated right ventricular systolic pressure is 17.3 mmHg. Left Atrium: Left atrial size was severely dilated. Pericardium: There is no evidence of pericardial effusion. Mitral Valve: The mitral valve is rheumatic. There is severe thickening of the mitral valve leaflet(s). There is moderate calcification of the mitral valve leaflet(s). Severe mitral valve regurgitation. No evidence of mitral valve stenosis. MV peak gradient, 16.8 mmHg. The mean mitral valve gradient is 4.0 mmHg. Tricuspid Valve: The tricuspid valve is normal in structure. Tricuspid valve regurgitation is trivial. Aortic Valve: The aortic valve is tricuspid. Aortic  valve regurgitation is trivial. Pulmonic Valve: The pulmonic valve was not well visualized. Pulmonic valve regurgitation is not visualized. No evidence of pulmonic stenosis. Venous: The inferior vena cava is normal in size with greater than 50% respiratory variability, suggesting right atrial pressure of 3 mmHg. Additional Comments: Spectral Doppler performed.  LEFT VENTRICLE PLAX 2D LVIDd:         6.10 cm LVIDs:         4.10 cm LV PW:         1.20 cm LV IVS:        1.30 cm  LEFT ATRIUM            Index LA Vol (A4C): 125.0 ml 60.42 ml/m  MITRAL VALVE                  TRICUSPID VALVE MV Area (PHT): 1.76 cm       TR Peak grad:   14.3 mmHg MV Peak grad:  16.8 mmHg      TR Vmax:        189.00 cm/s MV Mean grad:  4.0 mmHg MV Vmax:       2.05 m/s MV Vmean:      92.4 cm/s MV Decel Time: 430 msec MR Peak grad:    123.7 mmHg MR Mean grad:    78.0 mmHg MR Vmax:         556.00 cm/s MR Vmean:        413.0 cm/s MR PISA:         7.60 cm MR PISA Eff ROA: 54 mm MR PISA Radius:  1.10 cm MV E velocity: 155.00 cm/s MV A velocity: 90.70 cm/s MV E/A ratio:  1.71 Yvonne Kendall MD Electronically signed by Yvonne Kendall MD Signature Date/Time: 10/01/2023/4:13:14 PM    Final    DG Chest Port 1 View Result Date: 09/28/2023 CLINICAL DATA:  [id number] Respiratory distress [id number] EXAM: PORTABLE CHEST 1 VIEW COMPARISON:  September 25, 2023 FINDINGS: The cardiomediastinal silhouette is unchanged and enlarged in contour. Favor small RIGHT pleural effusion. No pneumothorax. Increased diffuse bilateral airspace opacities with vascular indistinctness and peribronchial cuffing. IMPRESSION: 1. Constellation of findings are favored to reflect pulmonary edema, increased. Differential considerations include atypical infection. 2. RIGHT pleural effusion. Electronically Signed   By: Meda Klinefelter M.D.   On: 09/28/2023 08:32   ECHOCARDIOGRAM COMPLETE Result Date: 09/27/2023    ECHOCARDIOGRAM REPORT   Patient Name:   TAMYRA FOJTIK Date of Exam: 09/26/2023  Medical  Rec #:  [id number]    Height:       67.0 in Accession #:    [id number]   Weight:       211.0 lb Date of Birth:  [birth year]   BSA:          2.069 m Patient Age:    52 years     BP:           150/137 mmHg Patient Gender: F            HR:           118 bpm. Exam Location:  [facility] Procedure: 2D Echo, Cardiac Doppler and Color Doppler (Both Spectral and Color            Flow Doppler were utilized during procedure). Indications:     Atrial Fibrillation  History:         Patient has no prior history of Echocardiogram examinations.                  CHF, Arrythmias:Atrial Fibrillation; Risk Factors:Hypertension,                  Dyslipidemia and Current Smoker.  Sonographer:     Mikki Harbor Referring Phys:  [id number] JAN A MANSY Diagnosing Phys: Julien Nordmann MD IMPRESSIONS  1. Left ventricular ejection fraction, by estimation, is 45 to 50%. The left ventricle has mildly decreased function. The left ventricle demonstrates global hypokinesis. The left ventricular internal cavity size was mildly dilated. There is mild left ventricular hypertrophy. Left ventricular diastolic parameters are indeterminate.  2. Right ventricular systolic function is mildly reduced. The right ventricular size is normal. There is normal pulmonary artery systolic pressure. The estimated right ventricular systolic pressure is 25.8 mmHg.  3. Left atrial size was severely dilated.  4. 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 No evidence of mitral stenosis. The mean mitral valve gradient is 11.0 mmHg. Moderate mitral annular calcification.  5. Tricuspid valve regurgitation is mild to moderate.  6. The aortic valve is tricuspid. Aortic valve regurgitation is mild. No aortic stenosis is present.  7. There is borderline dilatation of the ascending aorta, measuring 38 mm.  8. The inferior vena cava is normal in size with greater than 50% respiratory variability, suggesting right atrial pressure of 3 mmHg. FINDINGS  Left  Ventricle: Left ventricular ejection fraction, by estimation, is 45 to 50%. The left ventricle has mildly decreased function. The left ventricle demonstrates global hypokinesis. Strain was performed and the global longitudinal strain is indeterminate. The left ventricular internal cavity size was mildly dilated. There is mild left ventricular hypertrophy. Left ventricular diastolic parameters are indeterminate. Right Ventricle: The right ventricular size is normal. No increase in right ventricular wall thickness. Right ventricular systolic function is mildly reduced. There is normal pulmonary artery systolic pressure. The tricuspid regurgitant velocity is 2.39 m/s, and with an assumed right atrial pressure of 3 mmHg, the estimated right ventricular systolic pressure is 25.8 mmHg. Left Atrium: Left atrial size was severely dilated. Right Atrium: Right atrial size was normal in size. Pericardium: There is no evidence of pericardial effusion. Mitral Valve: The mitral valve is rheumatic. There is moderate thickening of the mitral valve leaflet(s). There is mild calcification of the mitral valve leaflet(s). Moderate mitral annular calcification. Severe mitral valve regurgitation. No evidence of  mitral valve stenosis. MV peak gradient, 19.2 mmHg. The mean mitral valve gradient is 11.0 mmHg. Tricuspid Valve: The tricuspid valve is normal in  structure. Tricuspid valve regurgitation is mild to moderate. No evidence of tricuspid stenosis. Aortic Valve: The aortic valve is tricuspid. Aortic valve regurgitation is mild. No aortic stenosis is present. Aortic valve mean gradient measures 6.0 mmHg. Aortic valve peak gradient measures 11.2 mmHg. Aortic valve area, by VTI measures 3.69 cm. Pulmonic Valve: The pulmonic valve was normal in structure. Pulmonic valve regurgitation is not visualized. No evidence of pulmonic stenosis. Aorta: The aortic root is normal in size and structure. There is borderline dilatation of the ascending  aorta, measuring 38 mm. Venous: The inferior vena cava is normal in size with greater than 50% respiratory variability, suggesting right atrial pressure of 3 mmHg. IAS/Shunts: No atrial level shunt detected by color flow Doppler. Additional Comments: 3D was performed not requiring image post processing on an independent workstation and was indeterminate.  LEFT VENTRICLE PLAX 2D LVIDd:         5.80 cm LVIDs:         4.20 cm LV PW:         1.30 cm LV IVS:        1.20 cm LVOT diam:     2.30 cm LV SV:         94 LV SV Index:   45 LVOT Area:     4.15 cm  RIGHT VENTRICLE RV Basal diam:  3.85 cm RV Mid diam:    2.90 cm RV S prime:     14.90 cm/s LEFT ATRIUM              Index        RIGHT ATRIUM           Index LA diam:        4.70 cm  2.27 cm/m   RA Area:     19.00 cm LA Vol (A2C):   125.0 ml 60.42 ml/m  RA Volume:   57.00 ml  27.55 ml/m LA Vol (A4C):   84.3 ml  40.75 ml/m LA Biplane Vol: 107.0 ml 51.72 ml/m  AORTIC VALVE                     PULMONIC VALVE AV Area (Vmax):    3.34 cm      PV Vmax:       0.98 m/s AV Area (Vmean):   3.14 cm      PV Peak grad:  3.9 mmHg AV Area (VTI):     3.69 cm AV Vmax:           167.33 cm/s AV Vmean:          113.267 cm/s AV VTI:            0.254 m AV Peak Grad:      11.2 mmHg AV Mean Grad:      6.0 mmHg LVOT Vmax:         134.50 cm/s LVOT Vmean:        85.500 cm/s LVOT VTI:          0.226 m LVOT/AV VTI ratio: 0.89  AORTA Ao Root diam: 3.60 cm Ao Asc diam:  3.80 cm MITRAL VALVE                  TRICUSPID VALVE MV Area (PHT): 3.21 cm       TR Peak grad:   22.8 mmHg MV Area VTI:   2.97 cm       TR Vmax:        239.00  cm/s MV Peak grad:  19.2 mmHg MV Mean grad:  11.0 mmHg      SHUNTS MV Vmax:       2.19 m/s       Systemic VTI:  0.23 m MV Vmean:      159.0 cm/s     Systemic Diam: 2.30 cm MV Decel Time: 236 msec MR Peak grad:    112.6 mmHg MR Mean grad:    72.0 mmHg MR Vmax:         530.50 cm/s MR Vmean:        396.5 cm/s MR PISA:         9.05 cm MR PISA Eff ROA: 144 mm MR PISA  Radius:  1.20 cm MV E velocity: 161.00 cm/s Julien Nordmann MD Electronically signed by Julien Nordmann MD Signature Date/Time: 09/26/2023/3:43:45 PM    Final (Updated)    CT Angio Chest Pulmonary Embolism (PE) W or WO Contrast Result Date: 09/26/2023 CLINICAL DATA:  New onset atrial fib with RVR, not feeling well the past few days, with shortness of breath. EXAM: CT ANGIOGRAPHY CHEST WITH CONTRAST TECHNIQUE: Multidetector CT imaging of the chest was performed using the standard protocol during bolus administration of intravenous contrast. Multiplanar CT image reconstructions and MIPs were obtained to evaluate the vascular anatomy. RADIATION DOSE REDUCTION: This exam was performed according to the departmental dose-optimization program which includes automated exposure control, adjustment of the mA and/or kV according to patient size and/or use of iterative reconstruction technique. CONTRAST:  OMNIPAQUE IOHEXOL 350 MG/ML SOLN COMPARISON:  Portable chest yesterday, chest radiograph 08/23/2015, and chest CT with contrast both 08/23/2015. FINDINGS: Cardiovascular: Since 2017, increased cardiomegaly with left chamber predominance. There is an enlarged pulmonary trunk measuring 3.6 cm indicating arterial hypertension, previously was 2.4 cm. Also noted is reflux of contrast into the IVC and hepatic veins which may suggest right heart dysfunction or tricuspid regurgitation. There is no increased RV/LV ratio and no embolic arterial filling defect is seen. Central pulmonary veins are prominent. There is mild aortic atherosclerosis. Aortic opacification is insufficient to assess the lumen. The great vessels branch normally. Slight aneurysmal prominence is seen in the mid ascending segment which is 4.3 cm on 7:49 burden the aortic root measures 4 cm at the sinuses of Valsalva on 7:52. There is no substantial pericardial effusion. There are no visible coronary calcifications. Mediastinum/Nodes: Multiple calcified bihilar  and mediastinal lymph nodes are again shown. There is a 1.7 cm hypodense nodule posteriorly in the right lobe of the thyroid gland. This was not seen previously. Nonemergent follow-up ultrasound is recommended. Axillary spaces are clear. No noncalcified adenopathy is seen in the chest. There are retained secretions versus aspirate in the thoracic trachea. The main bronchi are clear.  The thoracic esophagus is unremarkable. Lungs/Pleura: There is interlobular septal thickening with a basal gradient in the lungs consistent with interstitial edema, moderate. Findings likely due to CHF or fluid overload. There is patchy haziness in the posterior lower lobes which most likely ground-glass edema. Diffuse bronchial thickening is seen and could be congestive, due to bronchitis or reactive airway disease. There are scattered calcified granulomas in both lungs. There is a stable 8 x 7 mm left lower lobe lateral basal nodule on 5:121, stable 4 mm left lower lobe pleural-based nodule laterally on 5:106. Bones are consistent with benign nodules given the length of stability. There is patchy dense peripheral airspace disease in the right upper lobe, a small amount in the right lower lobe perihilar  area. This could be due to asymmetric airspace edema or superimposed pneumonia. Upper Abdomen: There are calcified splenic granulomas, occasional calcified hepatic granulomas. No calcified gallstones are seen. There is pericholecystic edema, in this case probably due to congestive edema if there are no localizing symptoms. If there are localizing symptoms, ultrasound may be helpful for further study. No other significant upper abdominal findings. Abdominal aortic atherosclerosis. Musculoskeletal: Degenerative change and mild kyphosis thoracic spine. No acute or other significant osseous findings. No mass in the visualized chest wall. Review of the MIP images confirms the above findings. IMPRESSION: 1. No evidence of arterial embolus. 2.  Increased cardiomegaly with left chamber predominance since 2017. 3. Enlarged pulmonary trunk 3.6 cm indicating arterial hypertension. 4. Reflux of contrast into the IVC and hepatic veins which may suggest right heart dysfunction or tricuspid regurgitation. 5. 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 Recommend annual imaging followup by CTA or MRA. This recommendation follows 2010 ACCF/AHA/AATS/ACR/ASA/SCA/SCAI/SIR/STS/ SVM Guidelines for the Diagnosis and Management of Patients with Thoracic Aortic Disease. Circulation. 2010; 121: J478-G956. Aortic aneurysm NOS (ICD10-I71.9). 6. Aortic atherosclerosis. 7. Interstitial edema with a basal gradient in the lungs, with patchy haziness in the posterior lower lobes most likely ground-glass edema. Findings consistent with CHF or fluid overload. 8. Diffuse bronchial thickening which could be congestive, due to bronchitis or reactive airway disease. 9. Patchy dense peripheral airspace disease in the right upper lobe, a small amount in the right lower lobe perihilar area. This could be due to asymmetric airspace edema or superimposed pneumonia. 10. Pericholecystic edema, probably due to congestive edema if there are no localizing symptoms. If there are localizing symptoms, ultrasound may be helpful for further study. 11. 1.7 cm hypodense nodule posteriorly in the right lobe of the thyroid gland. Nonemergent follow-up ultrasound is recommended. 12. Stable left lower lobe nodules. 13. Retained secretions versus aspirate in the thoracic trachea. Aortic Atherosclerosis (ICD10-I70.0). Electronically Signed   By: Almira Bar M.D.   On: 09/26/2023 02:46   DG Chest Portable 1 View Result Date: 09/25/2023 CLINICAL DATA:  Cough and atrial fibrillation. EXAM: PORTABLE CHEST 1 VIEW COMPARISON:  August 23, 2015 FINDINGS: The cardiac silhouette is mildly enlarged. Mild prominence of the central pulmonary vasculature is seen. Ill-defined subcentimeter calcified pulmonary nodules are seen  scattered throughout both lungs. No focal consolidation, pleural effusion or pneumothorax is identified. The visualized skeletal structures are unremarkable. IMPRESSION: 1. Mild cardiomegaly with mild central pulmonary vascular congestion. 2. Findings likely consistent with sequelae associated with prior granulomatous disease. Electronically Signed   By: Aram Candela M.D.   On: 09/25/2023 20:20     Results for orders placed or performed during the hospital encounter of 10/03/23 (from the past 48 hours)  Glucose, capillary     Status: Abnormal   Collection Time: 10/12/23  5:33 PM  Result Value Ref Range   Glucose-Capillary 147 (H) 70 - 99 mg/dL    Comment: Glucose reference range applies only to samples taken after fasting for at least 8 hours.  Glucose, capillary     Status: Abnormal   Collection Time: 10/12/23  9:04 PM  Result Value Ref Range   Glucose-Capillary 115 (H) 70 - 99 mg/dL    Comment: Glucose reference range applies only to samples taken after fasting for at least 8 hours.   Comment 1 Notify RN    Comment 2 Document in Chart   Glucose, capillary     Status: None   Collection Time: 10/13/23 12:32 AM  Result Value  Ref Range   Glucose-Capillary 97 70 - 99 mg/dL    Comment: Glucose reference range applies only to samples taken after fasting for at least 8 hours.   Comment 1 Notify RN    Comment 2 Document in Chart   Protime-INR     Status: Abnormal   Collection Time: 10/13/23  3:11 AM  Result Value Ref Range   Prothrombin Time 25.5 (H) 11.4 - 15.2 seconds   INR 2.3 (H) 0.8 - 1.2    Comment: (NOTE) INR goal varies based on device and disease states. Performed at [facility], [address] [place], [place] [address]   Basic metabolic panel     Status: Abnormal   Collection Time: 10/13/23  3:11 AM  Result Value Ref Range   Sodium 129 (L) 135 - 145 mmol/L   Potassium 3.9 3.5 - 5.1 mmol/L   Chloride 91 (L) 98 - 111 mmol/L   CO2 29 22 - 32 mmol/L   Glucose, Bld  121 (H) 70 - 99 mg/dL    Comment: Glucose reference range applies only to samples taken after fasting for at least 8 hours.   BUN 14 6 - 20 mg/dL   Creatinine, Ser 2.84 0.44 - 1.00 mg/dL   Calcium 8.4 (L) 8.9 - 10.3 mg/dL   GFR, Estimated >13 >24 mL/min    Comment: (NOTE) Calculated using the CKD-EPI Creatinine Equation (2021)    Anion gap 9 5 - 15    Comment: Performed at [facility], [address], [place], [place] [address]  Glucose, capillary     Status: Abnormal   Collection Time: 10/13/23  5:47 AM  Result Value Ref Range   Glucose-Capillary 103 (H) 70 - 99 mg/dL    Comment: Glucose reference range applies only to samples taken after fasting for at least 8 hours.   Comment 1 Notify RN    Comment 2 Document in Chart   CBC     Status: Abnormal   Collection Time: 10/13/23  8:48 AM  Result Value Ref Range   WBC 16.3 (H) 4.0 - 10.5 K/uL   RBC 3.24 (L) 3.87 - 5.11 MIL/uL   Hemoglobin 9.0 (L) 12.0 - 15.0 g/dL   HCT 72.5 (L) 36.6 - 44.0 %   MCV 86.1 80.0 - 100.0 fL   MCH 27.8 26.0 - 34.0 pg   MCHC 32.3 30.0 - 36.0 g/dL   RDW 34.7 42.5 - 95.6 %   Platelets 320 150 - 400 K/uL   nRBC 2.0 (H) 0.0 - 0.2 %    Comment: Performed at [facility], [address] [place], [place] [address]  Glucose, capillary     Status: Abnormal   Collection Time: 10/13/23 12:41 PM  Result Value Ref Range   Glucose-Capillary 155 (H) 70 - 99 mg/dL    Comment: Glucose reference range applies only to samples taken after fasting for at least 8 hours.  Glucose, capillary     Status: Abnormal   Collection Time: 10/13/23  3:51 PM  Result Value Ref Range   Glucose-Capillary 114 (H) 70 - 99 mg/dL    Comment: Glucose reference range applies only to samples taken after fasting for at least 8 hours.  Glucose, capillary     Status: Abnormal   Collection Time: 10/13/23  9:14 PM  Result Value Ref Range   Glucose-Capillary 127 (H) 70 - 99 mg/dL    Comment: Glucose reference range applies only to  samples taken  after fasting for at least 8 hours.  Protime-INR     Status: Abnormal   Collection Time: 10/14/23  3:48 AM  Result Value Ref Range   Prothrombin Time 28.6 (H) 11.4 - 15.2 seconds   INR 2.7 (H) 0.8 - 1.2    Comment: (NOTE) INR goal varies based on device and disease states. Performed at [facility], [address] [place], [place] [address]   Basic metabolic panel     Status: Abnormal   Collection Time: 10/14/23  3:48 AM  Result Value Ref Range   Sodium 128 (L) 135 - 145 mmol/L   Potassium 3.9 3.5 - 5.1 mmol/L   Chloride 92 (L) 98 - 111 mmol/L   CO2 30 22 - 32 mmol/L   Glucose, Bld 93 70 - 99 mg/dL    Comment: Glucose reference range applies only to samples taken after fasting for at least 8 hours.   BUN 10 6 - 20 mg/dL   Creatinine, Ser 6.04 0.44 - 1.00 mg/dL   Calcium 8.0 (L) 8.9 - 10.3 mg/dL   GFR, Estimated >54 >09 mL/min    Comment: (NOTE) Calculated using the CKD-EPI Creatinine Equation (2021)    Anion gap 6 5 - 15    Comment: Performed at [facility], [address] [place], [place] [address]  Glucose, capillary     Status: None   Collection Time: 10/14/23  6:16 AM  Result Value Ref Range   Glucose-Capillary 90 70 - 99 mg/dL    Comment: Glucose reference range applies only to samples taken after fasting for at least 8 hours.  Glucose, capillary     Status: Abnormal   Collection Time: 10/14/23 11:23 AM  Result Value Ref Range   Glucose-Capillary 117 (H) 70 - 99 mg/dL    Comment: Glucose reference range applies only to samples taken after fasting for at least 8 hours.   Comment 1 Notify RN    Comment 2 Document in Chart     Treatments: surgery:  Mitral Valve Replacement with a 29 mm St Jude Mechanical Prosthesis Pulmonary vein isolation MAZE with occlusion of the LAA with a 50 mm Medtronic clip by Dr. Leafy Ro on 10/07/2023.  Discharge Exam: Blood pressure 123/68, pulse 77, temperature 98.5 F (36.9 C), temperature source Oral, resp.  rate 19, height 5\' 7"  (1.702 m), weight 93.7 kg, SpO2 94%.    General appearance: alert, cooperative, and no distress Heart: regular rate and rhythm and crisp valve click Lungs: clear Abdomen: benign Extremities: no edema Wound: healing well   Discharge Medications:  The patient has been discharged on:   1.Beta Blocker:  Yes [   ]                              No   [   ]                              If No, reason:  2.Ace Inhibitor/ARB: Yes [   ]                                     No  [  n  ]  If No, reason:BP well controlled on current Rx and plan is to initiate GDMT as outpatient per AHF/cardiology  3.Statin:   Yes [  x ]                  No  [   ]                  If No, reason:  4.Ecasa:  Yes  [ x  ]                  No   [   ]                  If No, reason:  Patient had ACS upon admission:No  Plavix/P2Y12 inhibitor: Yes [   ]                                      No  [ x  ]     Discharge Instructions     Amb Referral to Cardiac Rehabilitation   Complete by: As directed    Diagnosis: Valve Replacement   Valve: Mitral   After initial evaluation and assessments completed: Virtual Based Care may be provided alone or in conjunction with Phase 2 Cardiac Rehab based on patient barriers.: Yes   Intensive Cardiac Rehabilitation (ICR) [facility] location only OR Traditional Cardiac Rehabilitation (TCR) *If criteria for ICR are not met will enroll in TCR [facility] only): Yes      Allergies as of 10/14/2023       Reactions   Penicillins Hives   Has patient had a PCN reaction causing immediate rash, facial/tongue/throat swelling, SOB or lightheadedness with hypotension: No Has patient had a PCN reaction causing severe rash involving mucus membranes or skin necrosis: No Has patient had a PCN reaction that required hospitalization: No Has patient had a PCN reaction occurring within the last 10 years: No If all of the above answers are "NO", then  may proceed with Cephalosporin use.        Medication List     STOP taking these medications    acetaminophen-caffeine 500-65 MG Tabs per tablet Commonly known as: EXCEDRIN TENSION HEADACHE   buPROPion 150 MG 12 hr tablet Commonly known as: Wellbutrin SR   cyclobenzaprine 5 MG tablet Commonly known as: FLEXERIL   furosemide 10 MG/ML injection Commonly known as: LASIX   heparin 87564 UT/250ML infusion   hydrALAZINE 20 MG/ML injection Commonly known as: APRESOLINE   labetalol 5 MG/ML injection Commonly known as: NORMODYNE   LORazepam 1 MG tablet Commonly known as: ATIVAN   morphine (PF) 2 MG/ML injection   ondansetron 4 MG/2ML Soln injection Commonly known as: ZOFRAN   potassium chloride SA 20 MEQ tablet Commonly known as: KLOR-CON M   sacubitril-valsartan 24-26 MG Commonly known as: ENTRESTO   spironolactone 25 MG tablet Commonly known as: ALDACTONE   traZODone 50 MG tablet Commonly known as: DESYREL       TAKE these medications    acetaminophen 325 MG tablet Commonly known as: TYLENOL Take 2 tablets (650 mg total) by mouth every 4 (four) hours as needed for headache or mild pain (pain score 1-3).   amiodarone 200 MG tablet Commonly known as: PACERONE Take 2 tablets (400 mg total) by mouth 2 (two) times daily. For 5 days, then 400 mg daily for 5 days, then 200 mg daily What  changed:  medication strength additional instructions   aspirin 81 MG chewable tablet Chew 1 tablet (81 mg total) by mouth daily.   busPIRone 5 MG tablet Commonly known as: BUSPAR Take 1 tablet (5 mg total) by mouth 3 (three) times daily.   DULoxetine 20 MG capsule Commonly known as: Cymbalta Take 2 capsules (40 mg total) by mouth daily.   Fe Fum-Vit C-Vit B12-FA Caps capsule Commonly known as: TRIGELS-F FORTE Take 1 capsule by mouth daily.   ipratropium-albuterol 0.5-2.5 (3) MG/3ML Soln Commonly known as: DUONEB Take 3 mLs by nebulization every 2 (two) hours as  needed (shortness of breath).   isosorbide-hydrALAZINE 20-37.5 MG tablet Commonly known as: BIDIL Take 0.5 tablets by mouth 3 (three) times daily.   magnesium hydroxide 400 MG/5ML suspension Commonly known as: MILK OF MAGNESIA Take 30 mLs by mouth daily as needed for mild constipation or moderate constipation.   oxyCODONE 5 MG immediate release tablet Commonly known as: Oxy IR/ROXICODONE Take 1 tablet (5 mg total) by mouth every 6 (six) hours as needed for up to 7 days for severe pain (pain score 7-10).   rosuvastatin 40 MG tablet Commonly known as: CRESTOR Take 1 tablet (40 mg total) by mouth daily.   warfarin 2.5 MG tablet Commonly known as: COUMADIN Take 1 tablet (2.5 mg total) by mouth as directed. Alternating schedule every 3 days, take 2.5 mg daily for 2 days, then 5 mg daily- then repeat 3 day cycle. Then per coumadin clinic Notes to patient: Take 2.5mg  this evening 3/24 (you had 2.5mg  yesterday 3/23)  Take 5mg  Tuesday evening 3/25 Then 2.5mg  Wed 3/26 and Thurs 3/27 Then 5mg  Friday 3/28 Repeat the pattern 2.5mg  x2 days then 5mg  1 day                Durable Medical Equipment  (From admission, onward)           Start     Ordered   10/14/23 0720  For home use only DME oxygen  Once       Question Answer Comment  Length of Need 6 Months   Mode or (Route) Nasal cannula   Liters per Minute 2   Frequency Continuous (stationary and portable oxygen unit needed)   Oxygen delivery system Gas      10/14/23 0720   10/11/23 1123  For home use only DME 3 n 1  Once        10/11/23 1122   10/11/23 1122  For home use only DME 4 wheeled rolling walker with seat  Once       Question Answer Comment  Patient needs a walker to treat with the following condition Physical deconditioning   Patient needs a walker to treat with the following condition S/P MVR (mitral valve repair)      10/11/23 1122            Follow-up Information     [facility] [place]. Go on 10/23/2023.   Specialty: Cardiothoracic Surgery Why: Appointment time is at 3:00 pm Contact information: [address] [place], [address] [place] [address] [phone]        [facility]. Go on 10/24/2023.   Why: Please arrive by 2:00 pm in order to have PA/LAT CXR taken PRIOR to office appoitnment withTCTS PA Contact information: [address] [place] [address]        Antonieta Iba, MD Follow up.   Specialty: Cardiology Contact information:  [address] [place] [address] [phone]         [facility] Follow up.   Specialty: Cardiology Contact information: [address] [place] [address] [phone]        [facility] [address] Office Follow up.   Specialty: Cardiology Why: Appointment is for PT/INR to be done (on Coumadin for mechanical MVR). Appointment time is at [employer] information: [address] [place] [address] [phone]                Signed:  Rowe Clack, PA-C 10/14/2023, 1:24 PM

## 2023-10-07 NOTE — Anesthesia Procedure Notes (Signed)
 Central Venous Catheter Insertion Performed by: [place] Nation, MD, anesthesiologist Start/End3/17/2025 7:10 AM, 10/07/2023 7:20 AM Patient location: Pre-op. Preanesthetic checklist: patient identified, IV checked, site marked, risks and benefits discussed, surgical consent, monitors and equipment checked, pre-op evaluation, timeout performed and anesthesia consent Lidocaine 1% used for infiltration and patient sedated Hand hygiene performed  and maximum sterile barriers used  Catheter size: 8.5 Fr PA cath was placed.MAC introducer Swan type:thermodilution PA Cath depth:45 Procedure performed using ultrasound guided technique. Ultrasound Notes:anatomy identified, needle tip was noted to be adjacent to the nerve/plexus identified, no ultrasound evidence of intravascular and/or intraneural injection and image(s) printed for medical record Attempts: 1 Following insertion, line sutured and dressing applied. Post procedure assessment: blood return through all ports, free fluid flow and no air  Patient tolerated the procedure well with no immediate complications.

## 2023-10-07 NOTE — Op Note (Signed)
 CARDIOVASCULAR SURGERY OPERATIVE NOTE  10/07/2023 Sandra Hines [id number]  Surgeon:  Ashley Akin, MD  First Assistant: Normand Sloop [facility]                               An experienced assistant was required given the complexity of this surgery and the standard of surgical care. The assistant was needed for exposure, dissection, suctioning, retraction of delicate tissues and sutures, instrument exchange and for overall help during this procedure.     Preoperative Diagnosis: Rheumatic Mitral Regurgitation                                            Atrail Fibrillation                                            Ascending aortic aneurysm  Postoperative Diagnosis:  Same   Procedure: Mitral Valve Replacement with a 29 mm St Jude Mechanical Prosthesis Pulmonary vein isolation MAZE with occlusion of the LAA with a 50 mm Medtronic clip  Anesthesia:  General Endotracheal   Clinical History/Surgical Indication:  53 yo female with no previous rheuamtic fever history presented with HTN and rapid afib and CHF. Work up reveals EF 55%, severe rheumatic mitral regurgitation and moderate ascending aortic aneurysm. Pt has been cathed without CAD has moderate PHTN. Currently having AHF manage diuresis. I feel she needs to have her mitral valve replaced and at her age will use mechanical prosthesis. Will do pulm vein isolation maze and consider replacing ascending aorta since it measures just under 4.5 cm on CT scan.   Findings: The left and right ventricle had normal ventricular function.  The mitral valve was rheumatic with chordal shortening and thickening to all aspects of the valve apparatus.  At the conclusion of the procedure there was a well-seated mechanical prosthesis.  Patient was admitted atrially paced rhythm.  The ascending aorta measured under 4 cm by TEE and appeared also that way visually.  It was not addressed with replacement.  The mean mitral valve gradient was 4  mmHg.  Preparation:  The patient was seen in the preoperative holding area and the correct patient, correct operation were confirmed with the patient after reviewing the medical record and catheterization. The consent was signed by me. Preoperative antibiotics were given. A pulmonary arterial line and radial arterial line were placed by the anesthesia team. The patient was taken back to the operating room and positioned supine on the operating room table. After being placed under general endotracheal anesthesia by the anesthesia team a foley catheter was placed. The neck, chest, abdomen, and both legs were prepped with betadine soap and solution and draped in the usual sterile manner. A surgical time-out was taken and the correct patient and operative procedure were confirmed with the nursing and anesthesia staff.  Operation: Median sternotomy incision was then created and the sternal divided the sternal saw.  Simultaneously heparin was delivered and a pericardial well developed.  The aorta was cannulated with a 20 [place] Starns aortic cannula and a single straight venous cannulas placed right atrial appendage and directed towards the inferior vena cava.  An additional right ankle venous cannulas placed in the superior vena cava.  With adequate confirmation of anticoagulation cardiopulmonary bypass was instituted. The right pulmonary veins were then isolated with the AtriCure clamp and 3 sets of 2 burns were created. An antegrade cardioplegia catheter was then placed in the ascending aorta and aortic cross-clamp placed.  Cold blood potassium cardioplegia was then delivered which was [facility] cardioplegia for total of 1300 cc.  Reanimation dose was delivered just prior to cross-clamp removal. The left pulmonary veins were then also isolated with the AtriClip for 3 sets of 2 burns and the left atrial appendage was occluded at its base with a 15 mm Medtronic clip. Left atrium was opened in the atrial groove  and adequate exposure of the mitral valve was obtained.  Secondary to its significant rheumatic features it was not a repairable valve and anterior leaflet was resected through 2-0 Ethibond sutures were then placed with the pledgets on the atrial side circle annularly with maintaining the posterior leaflet and chordal apparatus.  A 29 mm Saint Jude mechanical prosthesis was then secured with the cor knot system. The left atrium was then closed over a ventricular sump keeping the mechanical valve incompetent.  Patient was placed in a headdown position aortic cross-clamp was removed.  Multiple de-airing maneuvers were performed and ventricular and atrial pacing wires were placed and brought out the inferior stab with and secured.  Following adequate de-airing the sump was removed and the patient was weaned cardiopulmonary bypass on inotropic support.  Adequate hemodynamics protamine was delivered the patient was decannulated and sites oversewn necessary.  Chest tubes were brought inferior stab was and secured with adequate hemostasis the sternum was reapproximated with interrupted stainless steel wire and the presternal subcutaneous tissue and skin were closed in multiple layers absorbable suture.  Sterile dressings were applied.

## 2023-10-07 NOTE — Transfer of Care (Signed)
 Immediate Anesthesia Transfer of Care Note  Patient: Sandra Hines  Procedure(s) Performed: MITRAL VALVE REPLACEMENT USING SJM MASTERS SERIES MECHANICAL MITRAL HEART VALVE SIZE (Chest) MAZE PROCEDURE ECHOCARDIOGRAM, TRANSESOPHAGEAL, INTRAOPERATIVE CLIPPING, LEFT ATRIAL APPENDAGE USING MEDTRONIC PENDITURE LAA EXCLUSION SYSTEM SIZE (Chest)  Patient Location: ICU  Anesthesia Type:General  Level of Consciousness: sedated and Patient remains intubated per anesthesia plan  Airway & Oxygen Therapy: Patient remains intubated per anesthesia plan and Patient placed on Ventilator (see vital sign flow sheet for setting)  Post-op Assessment: Report given to RN and Post -op Vital signs reviewed and stable  Post vital signs: Reviewed and stable  Last Vitals:  Vitals Value Taken Time  BP 80/52 10/07/23 1125  Temp 36 10/07/23 1125  Pulse 80 10/07/23 1125  Resp 18 10/07/23 1125  SpO2 99 % 10/07/23 1125  Vitals shown include unfiled device data.  Dr. Leafy Ro & Dr. Charlynn Grimes at bedside  Patients Stated Pain Goal: 0 (10/06/23 9528)  Complications: There were no known notable events for this encounter.

## 2023-10-08 ENCOUNTER — Encounter (HOSPITAL_COMMUNITY): Payer: Self-pay | Admitting: Thoracic Surgery (Cardiothoracic Vascular Surgery)

## 2023-10-08 ENCOUNTER — Inpatient Hospital Stay (HOSPITAL_COMMUNITY)

## 2023-10-08 DIAGNOSIS — I051 Rheumatic mitral insufficiency: Secondary | ICD-10-CM | POA: Diagnosis not present

## 2023-10-08 DIAGNOSIS — I5033 Acute on chronic diastolic (congestive) heart failure: Secondary | ICD-10-CM | POA: Diagnosis not present

## 2023-10-08 DIAGNOSIS — R0689 Other abnormalities of breathing: Secondary | ICD-10-CM | POA: Diagnosis not present

## 2023-10-08 DIAGNOSIS — I10 Essential (primary) hypertension: Secondary | ICD-10-CM | POA: Diagnosis not present

## 2023-10-08 LAB — CBC
HCT: 33 % — ABNORMAL LOW (ref 36.0–46.0)
HCT: 32 % — ABNORMAL LOW (ref 36.0–46.0)
Hemoglobin: 10.2 g/dL — ABNORMAL LOW (ref 12.0–15.0)
Hemoglobin: 10 g/dL — ABNORMAL LOW (ref 12.0–15.0)
MCH: 27.8 pg (ref 26.0–34.0)
MCH: 27.8 pg (ref 26.0–34.0)
MCHC: 30.9 g/dL (ref 30.0–36.0)
MCHC: 31.3 g/dL (ref 30.0–36.0)
MCV: 89.9 fL (ref 80.0–100.0)
MCV: 88.9 fL (ref 80.0–100.0)
Platelets: 71 10*3/uL — ABNORMAL LOW (ref 150–400)
Platelets: 77 10*3/uL — ABNORMAL LOW (ref 150–400)
RBC: 3.67 MIL/uL — ABNORMAL LOW (ref 3.87–5.11)
RBC: 3.6 MIL/uL — ABNORMAL LOW (ref 3.87–5.11)
RDW: 15.5 % (ref 11.5–15.5)
RDW: 15.5 % (ref 11.5–15.5)
WBC: 15.9 10*3/uL — ABNORMAL HIGH (ref 4.0–10.5)
WBC: 24.4 10*3/uL — ABNORMAL HIGH (ref 4.0–10.5)
nRBC: 0 % (ref 0.0–0.2)
nRBC: 0 % (ref 0.0–0.2)

## 2023-10-08 LAB — GLUCOSE, CAPILLARY
Glucose-Capillary: 107 mg/dL — ABNORMAL HIGH (ref 70–99)
Glucose-Capillary: 127 mg/dL — ABNORMAL HIGH (ref 70–99)
Glucose-Capillary: 128 mg/dL — ABNORMAL HIGH (ref 70–99)
Glucose-Capillary: 131 mg/dL — ABNORMAL HIGH (ref 70–99)
Glucose-Capillary: 139 mg/dL — ABNORMAL HIGH (ref 70–99)
Glucose-Capillary: 140 mg/dL — ABNORMAL HIGH (ref 70–99)
Glucose-Capillary: 146 mg/dL — ABNORMAL HIGH (ref 70–99)
Glucose-Capillary: 150 mg/dL — ABNORMAL HIGH (ref 70–99)
Glucose-Capillary: 157 mg/dL — ABNORMAL HIGH (ref 70–99)
Glucose-Capillary: 168 mg/dL — ABNORMAL HIGH (ref 70–99)
Glucose-Capillary: 173 mg/dL — ABNORMAL HIGH (ref 70–99)
Glucose-Capillary: 205 mg/dL — ABNORMAL HIGH (ref 70–99)
Glucose-Capillary: 50 mg/dL — ABNORMAL LOW (ref 70–99)

## 2023-10-08 LAB — BASIC METABOLIC PANEL
Anion gap: 8 (ref 5–15)
Anion gap: 10 (ref 5–15)
BUN: 18 mg/dL (ref 6–20)
BUN: 19 mg/dL (ref 6–20)
CO2: 23 mmol/L (ref 22–32)
CO2: 23 mmol/L (ref 22–32)
Calcium: 8.6 mg/dL — ABNORMAL LOW (ref 8.9–10.3)
Calcium: 8.7 mg/dL — ABNORMAL LOW (ref 8.9–10.3)
Chloride: 99 mmol/L (ref 98–111)
Chloride: 95 mmol/L — ABNORMAL LOW (ref 98–111)
Creatinine, Ser: 1.02 mg/dL — ABNORMAL HIGH (ref 0.44–1.00)
Creatinine, Ser: 0.96 mg/dL (ref 0.44–1.00)
GFR, Estimated: >60 mL/min (ref >60)
GFR, Estimated: >60 mL/min (ref >60)
Glucose, Bld: 126 mg/dL — ABNORMAL HIGH (ref 70–99)
Glucose, Bld: 148 mg/dL — ABNORMAL HIGH (ref 70–99)
Potassium: 5.2 mmol/L — ABNORMAL HIGH (ref 3.5–5.1)
Potassium: 5.5 mmol/L — ABNORMAL HIGH (ref 3.5–5.1)
Sodium: 130 mmol/L — ABNORMAL LOW (ref 135–145)
Sodium: 128 mmol/L — ABNORMAL LOW (ref 135–145)

## 2023-10-08 LAB — MAGNESIUM
Magnesium: 2.5 mg/dL — ABNORMAL HIGH (ref 1.7–2.4)
Magnesium: 2.2 mg/dL (ref 1.7–2.4)

## 2023-10-08 LAB — SURGICAL PATHOLOGY

## 2023-10-08 MED ORDER — FUROSEMIDE 10 MG/ML IJ SOLN
40.0000 mg | Freq: Once | INTRAMUSCULAR | Status: AC
  Administered 2023-10-08: 40 mg via INTRAVENOUS
  Filled 2023-10-08: qty 4

## 2023-10-08 MED ORDER — ISOSORB DINITRATE-HYDRALAZINE 20-37.5 MG PO TABS
0.5000 | ORAL_TABLET | Freq: Three times a day (TID) | ORAL | Status: DC
  Administered 2023-10-08 – 2023-10-14 (×19): 0.5 via ORAL
  Filled 2023-10-08 (×19): qty 1

## 2023-10-08 MED ORDER — INSULIN ASPART 100 UNIT/ML IJ SOLN
0.0000 [IU] | INTRAMUSCULAR | Status: DC
  Administered 2023-10-08: 8 [IU] via SUBCUTANEOUS
  Administered 2023-10-08: 4 [IU] via SUBCUTANEOUS
  Administered 2023-10-08 (×2): 2 [IU] via SUBCUTANEOUS
  Administered 2023-10-09: 4 [IU] via SUBCUTANEOUS
  Administered 2023-10-09 (×3): 2 [IU] via SUBCUTANEOUS
  Administered 2023-10-10: 4 [IU] via SUBCUTANEOUS

## 2023-10-08 MED ORDER — ASPIRIN 81 MG PO CHEW
81.0000 mg | CHEWABLE_TABLET | Freq: Once | ORAL | Status: AC
  Administered 2023-10-08: 81 mg via ORAL
  Filled 2023-10-08: qty 1

## 2023-10-08 NOTE — Progress Notes (Signed)
 [address]       [place] [address]             [phone]      1 Day Post-Op  Procedure(s) (LRB): MITRAL VALVE REPLACEMENT USING SJM MASTERS SERIES MECHANICAL MITRAL HEART VALVE SIZE (N/A) MAZE PROCEDURE (N/A) ECHOCARDIOGRAM, TRANSESOPHAGEAL, INTRAOPERATIVE (N/A) CLIPPING, LEFT ATRIAL APPENDAGE USING MEDTRONIC PENDITURE LAA EXCLUSION SYSTEM SIZE   Total Length of Stay:  LOS: 5 days    SUBJECTIVE: Feels pressure from foley balloon Has discomfort  Vitals:   10/08/23 0630 10/08/23 0645  BP: (!) 152/96   Pulse: 80 80  Resp: 18 (!) 31  Temp: 99.3 F (37.4 C) 99.1 F (37.3 C)  SpO2: 94% 94%    Intake/Output      03/17 0701 03/18 0700 03/18 0701 03/19 0700   P.O. 480    I.V. (mL/kg) 2273.6 (23.5)    Blood 467    IV Piggyback 1796.4    Total Intake(mL/kg) 5017 (51.9)    Urine (mL/kg/hr) 2325 (1)    Blood 700    Chest Tube 620    Total Output 3645    Net +1372             sodium chloride 10 mL/hr at 10/08/23 0600   albumin human 999 mL/hr at 10/07/23 1400   dexmedetomidine (PRECEDEX) IV infusion Stopped (10/07/23 1334)   insulin 1.3 Units/hr (10/08/23 0600)   levofloxacin (LEVAQUIN) IV     nitroGLYCERIN Stopped (10/07/23 1215)   phenylephrine (NEO-SYNEPHRINE) Adult infusion Stopped (10/07/23 1313)    CBC    Component Value Date/Time   WBC 15.9 (H) 10/08/2023 0429   RBC 3.67 (L) 10/08/2023 0429   HGB 10.2 (L) 10/08/2023 0429   HGB 13.3 07/19/2023 1354   HCT 33.0 (L) 10/08/2023 0429   HCT 42.1 07/19/2023 1354   PLT 71 (L) 10/08/2023 0429   PLT 184 07/19/2023 1354   MCV 89.9 10/08/2023 0429   MCV 88 07/19/2023 1354   MCV 85 08/15/2014 0533   MCH 27.8 10/08/2023 0429   MCHC 30.9 10/08/2023 0429   RDW 15.5 10/08/2023 0429   RDW 12.9 07/19/2023 1354   RDW 14.4 08/15/2014 0533   LYMPHSABS 2.9 10/03/2023 0447   LYMPHSABS 2.2 07/19/2023 1354   LYMPHSABS 3.3 08/15/2014 0533   MONOABS 0.8 10/03/2023 0447   MONOABS  1.1 (H) 08/15/2014 0533   EOSABS 0.4 10/03/2023 0447   EOSABS 0.1 07/19/2023 1354   EOSABS 0.1 08/15/2014 0533   BASOSABS 0.1 10/03/2023 0447   BASOSABS 0.1 07/19/2023 1354   BASOSABS 0.1 08/15/2014 0533   CMP     Component Value Date/Time   NA 130 (L) 10/08/2023 0429   NA 139 07/19/2023 1354   NA 136 08/15/2014 0533   K 5.2 (H) 10/08/2023 0429   K 3.8 08/15/2014 0533   CL 99 10/08/2023 0429   CL 102 08/15/2014 0533   CO2 23 10/08/2023 0429   CO2 23 08/15/2014 0533   GLUCOSE 126 (H) 10/08/2023 0429   GLUCOSE 103 (H) 08/15/2014 0533   BUN 18 10/08/2023 0429   BUN 16 07/19/2023 1354   BUN 10 08/15/2014 0533   CREATININE 1.02 (H) 10/08/2023 0429   CREATININE 0.80 08/15/2014 0533   CALCIUM 8.6 (L) 10/08/2023 0429   CALCIUM 9.5 08/15/2014 0533   PROT 6.9 09/30/2023 0007   PROT 6.7 07/19/2023 1354   PROT 7.1 08/15/2014 0533   ALBUMIN 2.9 (L) 10/03/2023 0447  ALBUMIN 4.0 07/19/2023 1354   ALBUMIN 2.2 (L) 08/15/2014 0533   AST 29 09/30/2023 0007   AST 21 08/15/2014 0533   ALT 81 (H) 09/30/2023 0007   ALT 18 08/15/2014 0533   ALKPHOS 116 09/30/2023 0007   ALKPHOS 255 (H) 08/15/2014 0533   BILITOT 0.6 09/30/2023 0007   BILITOT 0.3 07/19/2023 1354   BILITOT 0.3 08/15/2014 0533   GFRNONAA >60 10/08/2023 0429   GFRNONAA >60 08/15/2014 0533   GFRNONAA >60 07/16/2013 1823   GFRAA >60 10/08/2017 2343   GFRAA >60 08/15/2014 0533   GFRAA >60 07/16/2013 1823   ABG    Component Value Date/Time   PHART 7.327 (L) 10/07/2023 1704   PCO2ART 42.6 10/07/2023 1704   PO2ART 99 10/07/2023 1704   HCO3 22.5 10/07/2023 1704   TCO2 24 10/07/2023 1704   ACIDBASEDEF 4.0 (H) 10/07/2023 1704   O2SAT 97 10/07/2023 1704   CBG (last 3)  Recent Labs    10/08/23 0447 10/08/23 0551 10/08/23 0653  GLUCAP 131* 173* 168*  EXAM Lungs: clear Card: RR valve click Ext: warm Neuro: alert   ASSESSMENT: POD #1 SP MV replacement (mechanical) and MAZE Hemodynamics good. Remove swan and  aline Will put to AAI. Has underlying sinus arrest with bigeminy Hold Amiodarone Lasix today Thrombocytopenia: expected post op. Will hold on anticoagulation until recovers Leave chest tubes OOB to chair   Eugenio Hoes, MD 10/08/2023

## 2023-10-08 NOTE — Progress Notes (Signed)
 NAME:  Sandra Hines, MRN:  [id number], DOB:  30-Jul-1970, LOS: 5 ADMISSION DATE:  10/03/2023, CONSULTATION DATE:  10/07/2023 REFERRING MD: Dr. Leafy Ro, CHIEF COMPLAINT: Status post mitral valve replacement  History of Present Illness:  53 year old female who presented with shortness of breath, generalized weakness and malaise and palpitation at [facility], noted to be in hypertensive emergency she was diuresed and blood pressure was brought under control, also she was noted to be in A-fib with RVR heart rate ranging in 180s, she did not respond to Cardizem and then was given amiodarone with conversion to sinus rhythm.  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  Symptomatically she was treated, underwent cardiac cath which showed no obstructive coronary artery disease, patient was transferred to [facility], today she underwent mechanical mitral valve replacement and pulmonary vein isolation maze with occlusion of left atrial appendage.  Patient remained intubated was transferred to ICU.  PCCM was consulted for help evaluation medical management  Pertinent  Medical History  Hypertension Hyperlipidemia Tobacco dependence Cerebral aneurysm status postrepair at [facility] in 2019   Significant Hospital Events: Including procedures, antibiotic start and stop dates in addition to other pertinent events     Interim History / Subjective:  Patient stated surgical site pain is better Complaining of irritation and discomfort around Foley catheter Remain afebrile  Objective   Blood pressure (!) 152/96, pulse 80, temperature 99.1 F (37.3 C), resp. rate (!) 31, height 5\' 7"  (1.702 m), weight 96.7 kg, SpO2 94%. PAP: (23-62)/(7-38) 45/21 CVP:  [4 mmHg-39 mmHg] 12 mmHg CO:  [4.2 L/min-6.3 L/min] 5.3 L/min CI:  [2.09 L/min/m2-3.13 L/min/m2] 2.6 L/min/m2  Vent Mode: PSV;CPAP FiO2 (%):  [40 %-50 %] 40 % Set Rate:  [4 bmp-16 bmp] 4 bmp Vt Set:  [490 mL] 490  mL PEEP:  [5 cmH20] 5 cmH20 Pressure Support:  [10 cmH20] 10 cmH20   Intake/Output Summary (Last 24 hours) at 10/08/2023 0754 Last data filed at 10/08/2023 0600 Gross per 24 hour  Intake 4715.15 ml  Output 3645 ml  Net 1070.15 ml   Filed Weights   10/06/23 0358 10/07/23 0500 10/08/23 0429  Weight: 90.8 kg 88.6 kg 96.7 kg    Examination: General: Middle-aged obese female, lying on the bed HEENT: [place]/AT, eyes anicteric.  moist mucus membranes Neuro: Alert, awake following commands Chest: Central sternotomy incision looks clean and dry, coarse breath sounds, no wheezes or rhonchi.  Mediastinal and chest tube in place Heart: Paced rhythm, no murmurs or gallops Abdomen: Soft, nontender, nondistended, bowel sounds present Skin: No rash   Labs and images reviewed  Resolved Hospital Problem list     Assessment & Plan:  Severe mitral regurgitation is status post MVR Paroxysmal A-fib status post maze with LAA closure Continue aspirin and statin Holding amiodarone as underlying rhythm is bigeminy with heart rate in 40s Chest tube management TCTS Chest tube output 620 cc in last 24h Continue pain control with tramadol, oxycodone and morphine Closely monitor chest tube output Holding anticoagulation due to low platelet count  Acute respiratory insufficiency, postop Patient was successfully extubated per rapid weaning protocol Encourage incentive spirometry, flutter valve and ambulation  Hypertension Started back on BiDil per advanced heart failure team  Hyperlipidemia Continue Crestor  Prediabetes Patient hemoglobin A1c is 5.8 Switch insulin infusion to sliding scale with CBG goal 140-180  Expected perioperative blood loss anemia Thrombocytopenia due to CPB Monitor H/H and PLT counts  Obesity Diet  and exercise counseling as appropriate   Best Practice (right click and "Reselect all SmartList Selections" daily)   Diet/type: Regular consistency DVT prophylaxis:  SCD GI prophylaxis: PPI Lines: Remove central line and arterial line Foley: Remove Foley catheter Code Status:  full code Last date of multidisciplinary goals of care discussion [Per primary team]   Labs   CBC: Recent Labs  Lab 10/03/23 0447 10/03/23 0810 10/06/23 0357 10/07/23 0417 10/07/23 0753 10/07/23 0947 10/07/23 0956 10/07/23 1150 10/07/23 1336 10/07/23 1512 10/07/23 1609 10/07/23 1704 10/08/23 0429  WBC 9.8   < > 12.9* 11.7*  --   --   --  16.8*  --   --  16.9*  --  15.9*  NEUTROABS 5.6  --   --   --   --   --   --   --   --   --   --   --   --   HGB 11.6*   < > 13.3 13.7   < > 10.7*   < > 8.9* 10.2* 11.2* 10.5* 11.2* 10.2*  HCT 35.3*   < > 40.8 42.5   < > 33.7*   < > 28.0* 30.0* 33.0* 33.6* 33.0* 33.0*  MCV 85.3   < > 86.6 86.2  --   --   --  89.7  --   --  88.9  --  89.9  PLT 206   < > 288 268  --  43*  --  58*  --   --  75*  --  71*   < > = values in this interval not displayed.    Basic Metabolic Panel: Recent Labs  Lab 10/02/23 0442 10/03/23 0447 10/03/23 0810 10/05/23 0449 10/06/23 0357 10/07/23 0417 10/07/23 0753 10/07/23 0836 10/07/23 0853 10/07/23 0929 10/07/23 0956 10/07/23 1024 10/07/23 1027 10/07/23 1336 10/07/23 1512 10/07/23 1525 10/07/23 1704 10/08/23 0429  NA 139 137   < > 138 133* 132*   < > 134*   < > 131*   < > 132*   < > 134* 136 134* 133* 130*  K 3.6 4.2   < > 4.9 4.9 5.1   < > 4.6   < > 6.2*   < > 5.1   < > 5.4* 6.5* 6.1* 5.9* 5.2*  CL 103 105   < > 104 99 99   < > 103  --  101  --  102  --   --   --  104  --  99  CO2 27 22   < > 25 22 21*  --   --   --   --   --   --   --   --   --  22  --  23  GLUCOSE 100* 151*   < > 124* 156* 145*   < > 129*  --  143*  --  125*  --   --   --  129*  --  126*  BUN 21* 19   < > 15 21* 25*   < > 25*  --  27*  --  27*  --   --   --  24*  --  18  CREATININE 0.91 0.85   < > 1.08* 1.14* 1.40*   < > 1.30*  --  1.60*  --  1.60*  --   --   --  1.43*  --  1.02*  CALCIUM 8.7* 8.6*   < > 9.0 9.0 9.3  --    --   --   --   --   --   --   --   --  8.7*  --  8.6*  MG  --   --   --   --   --   --   --   --   --   --   --   --   --   --   --  3.9*  --  2.5*  PHOS 3.5 3.4  --   --   --   --   --   --   --   --   --   --   --   --   --   --   --   --    < > = values in this interval not displayed.   GFR: Estimated Creatinine Clearance: 78.8 mL/min (A) (by C-G formula based on SCr of 1.02 mg/dL (H)). Recent Labs  Lab 10/07/23 0417 10/07/23 1150 10/07/23 1609 10/08/23 0429  WBC 11.7* 16.8* 16.9* 15.9*    Liver Function Tests: Recent Labs  Lab 10/02/23 0442 10/03/23 0447  ALBUMIN 2.9* 2.9*   No results for input(s): "LIPASE", "AMYLASE" in the last 168 hours. No results for input(s): "AMMONIA" in the last 168 hours.  ABG    Component Value Date/Time   PHART 7.327 (L) 10/07/2023 1704   PCO2ART 42.6 10/07/2023 1704   PO2ART 99 10/07/2023 1704   HCO3 22.5 10/07/2023 1704   TCO2 24 10/07/2023 1704   ACIDBASEDEF 4.0 (H) 10/07/2023 1704   O2SAT 97 10/07/2023 1704     Coagulation Profile: Recent Labs  Lab 10/07/23 1150  INR 1.7*    Cardiac Enzymes: No results for input(s): "CKTOTAL", "CKMB", "CKMBINDEX", "TROPONINI" in the last 168 hours.  HbA1C: Hgb A1c MFr Bld  Date/Time Value Ref Range Status  07/19/2023 01:54 PM 5.8 (H) 4.8 - 5.6 % Final    Comment:             Prediabetes: 5.7 - 6.4          Diabetes: >6.4          Glycemic control for adults with diabetes: <7.0     CBG: Recent Labs  Lab 10/08/23 0151 10/08/23 0352 10/08/23 0447 10/08/23 0551 10/08/23 0653  GLUCAP 139* 107* 131* 173* 168*      Cheri Fowler, MD [facility] See Amion for pager If no response to pager, please call [phone] until 7pm After 7pm, Please call E-link [phone]

## 2023-10-08 NOTE — Progress Notes (Addendum)
 Patient ID: Sandra Hines, female   DOB: [birth year], 53 y.o.   MRN: [id number]     Advanced Heart Failure Rounding Note  Cardiologist: Julien Nordmann, MD  Chief Complaint: mitral regurgitation Subjective:   3/17: S/p MVR, pulmonary vein isolation MAZE and LAA occlusion 10/07/23. POD # 1  Swann #s CVP 9 PA 39/11 (21) CO 5.28 CI 2.64  Stable off Neo.  Feels ok just complaining of post-op pain in her chest and pressure from the foley.   Objective:   Weight Range: 96.7 kg Body mass index is 33.38 kg/m.   Vital Signs:   Temp:  [95.4 F (35.2 C)-99.3 F (37.4 C)] 99.1 F (37.3 C) (03/18 0645) Pulse Rate:  [55-81] 80 (03/18 0645) Resp:  [9-31] 31 (03/18 0645) BP: (96-152)/(53-96) 152/96 (03/18 0630) SpO2:  [90 %-100 %] 94 % (03/18 0645) Arterial Line BP: (86-136)/(54-96) 90/57 (03/18 0004) FiO2 (%):  [40 %-50 %] 40 % (03/17 1313) Weight:  [96.7 kg] 96.7 kg (03/18 0429) Last BM Date : 10/05/23  Weight change: Filed Weights   10/06/23 0358 10/07/23 0500 10/08/23 0429  Weight: 90.8 kg 88.6 kg 96.7 kg   Intake/Output:   Intake/Output Summary (Last 24 hours) at 10/08/2023 0710 Last data filed at 10/08/2023 0600 Gross per 24 hour  Intake 5016.95 ml  Output 3645 ml  Net 1371.95 ml   CVP 9  Physical Exam    General:  weak appearing.  No respiratory difficulty HEENT: normal Neck: supple. JVD ~10 cm. Swann RIJ Cor: PMI nondisplaced. Regular rate & rhythm. No rubs, gallops or murmurs. +mid sternal CT Lungs: clear. Extremities: no cyanosis, clubbing, rash, edema  Neuro: alert & oriented x 3. Moves all 4 extremities w/o difficulty. Affect pleasant.   Telemetry   AV paced 70 (Personally reviewed)    Labs    CBC Recent Labs    10/07/23 1609 10/07/23 1704 10/08/23 0429  WBC 16.9*  --  15.9*  HGB 10.5* 11.2* 10.2*  HCT 33.6* 33.0* 33.0*  MCV 88.9  --  89.9  PLT 75*  --  71*   Basic Metabolic Panel Recent Labs    84/69/62 1525 10/07/23 1704 10/08/23 0429  NA  134* 133* 130*  K 6.1* 5.9* 5.2*  CL 104  --  99  CO2 22  --  23  GLUCOSE 129*  --  126*  BUN 24*  --  18  CREATININE 1.43*  --  1.02*  CALCIUM 8.7*  --  8.6*  MG 3.9*  --  2.5*   Liver Function Tests No results for input(s): "AST", "ALT", "ALKPHOS", "BILITOT", "PROT", "ALBUMIN" in the last 72 hours.  No results for input(s): "LIPASE", "AMYLASE" in the last 72 hours. Cardiac Enzymes No results for input(s): "CKTOTAL", "CKMB", "CKMBINDEX", "TROPONINI" in the last 72 hours.  BNP: BNP (last 3 results) Recent Labs    09/25/23 1711  BNP 700.0*    ProBNP (last 3 results) No results for input(s): "PROBNP" in the last 8760 hours.   D-Dimer No results for input(s): "DDIMER" in the last 72 hours. Hemoglobin A1C No results for input(s): "HGBA1C" in the last 72 hours. Fasting Lipid Panel No results for input(s): "CHOL", "HDL", "LDLCALC", "TRIG", "CHOLHDL", "LDLDIRECT" in the last 72 hours. Thyroid Function Tests No results for input(s): "TSH", "T4TOTAL", "T3FREE", "THYROIDAB" in the last 72 hours.  Invalid input(s): "FREET3"  Other results:  Imaging   DG Chest Port 1 View Result Date: 10/07/2023 CLINICAL DATA:  Status post mitral valve replacement  EXAM: PORTABLE CHEST 1 VIEW COMPARISON:  X-ray 09/28/2023 and older FINDINGS: Status post median sternotomy. Prosthetic mitral valve. Atrial occlusion clip. Numerous tubes and lines. ET tube seen with tip proximally 4 cm above the carina. Mediastinal drains. Enteric tube extends beneath the diaphragm. Right IJ Swan-Ganz catheter tip overlying the right pulmonary artery. Enlarged cardiopericardial silhouette with some central vascular congestion. No pneumothorax or effusion. Underinflation. Overlapping cardiac leads. IMPRESSION: Acute surgical changes of mitral valve repair. Sternal wires. Numerous tubes and lines. Underinflation with enlarged heart and vascular congestion. No pneumothorax. Electronically Signed   By: Karen Kays M.D.   On:  10/07/2023 13:36   ECHO INTRAOPERATIVE TEE Result Date: 10/07/2023  *INTRAOPERATIVE TRANSESOPHAGEAL REPORT *  Patient Name:   Sandra Hines   Date of Exam: 10/07/2023 Medical Rec #:  [id number]      Height:       67.0 in Accession #:    [id number]     Weight:       195.3 lb Date of Birth:  July 23, 1971     BSA:          2.00 m Patient Age:    52 years       BP:           118/72 mmHg Patient Gender: F              HR:           56 bpm. Exam Location:  Anesthesiology Transesophogeal exam was perform intraoperatively during surgical procedure. Patient was closely monitored under general anesthesia during the entirety of examination. Indications:     Rheumatic mitral insufficiency Sonographer:     Irving Burton Senior RDCS Performing Phys: Anice Paganini DO Diagnosing Phys: Anice Paganini DO PROCEDURE: Intraoperative Transesophogeal 3D imaging performed to better assess valves. Complications: No known complications during this procedure. POST-OP IMPRESSIONS _ Left Ventricle: The left ventricular function is normal without inotropic support. _ Right Ventricle: The right ventricular function is mildly reduced without inotropic support. _ Aorta: The aorta appears unchanged from pre-bypass. There is no dissection. _ Left Atrial Appendage: The left atrial appendage appears appropriately occluded. There is no residual flow visualized with color flow doppler. _ Aortic Valve: The aortic valve appears unchanged from pre-bypass. Trace AI. _ Mitral Valve: There is a mechanical valve in the mitral position. The valve is well seated with no paravalvular leak. Trace intravalvular regurgitation jets seen consistant with normal washing jets. Mean pressure gradient 5 mmHg. _ Tricuspid Valve: The tricuspid valve appears unchanged from pre-bypass. Trace TR. _ Pulmonic Valve: The pulmonic valve appears unchanged from pre-bypass. Trace PI. _ Pericardium: There is no pericardial effusion. PRE-OP FINDINGS  Left Ventricle: The left ventricle has normal systolic  function, with an ejection fraction of 55-60%. The cavity size was mildly dilated. Right Ventricle: The right ventricle has mildly reduced systolic function. The cavity was moderately dialated. Left Atrium: No left atrial/left atrial appendage thrombus was detected.  Interatrial Septum: No atrial level shunt detected by color flow Doppler. There is no evidence of a patent foramen ovale. Pericardium: There is no evidence of pericardial effusion. There is no pleural effusion. Mitral Valve: The mitral valve is rheumatic. Significant restriction of both the posterior and anterior mitral leaflets. Copatation point well below annulus, with notable coaptation defect. Multiple regurgitation jets. Mitral valve regurgitation is severe by color flow Doppler. Tricuspid Valve: The tricuspid valve was normal in structure. Tricuspid valve regurgitation is trivial by color flow Doppler and limitted to PA  catheter site. Tricuspid annulus is dilated to 4.42cm Aortic Valve: The aortic valve is tricuspid. Aortic valve regurgitation is trivial by color flow Doppler. There is no stenosis of the aortic valve, with a calculated valve area of 2.03 cm. Pulmonic Valve: The pulmonic valve was normal in structure. Pulmonic valve regurgitation is trivial by color flow Doppler. Aorta: Ascending aorta measures 3.9cm in diameter. Aortic root measures 3.7cm +--------------+--------++ LEFT VENTRICLE          +----------------+---------++ +--------------+--------++  Diastology                PLAX 2D                 +----------------+---------++ +--------------+--------++  LV e' lateral:  8.97 cm/s LVOT diam:    2.20 cm   +----------------+---------++ +--------------+--------++  LV E/e' lateral:10.3      LVOT Area:    3.80 cm  +----------------+---------++ +--------------+--------++                        +--------------+--------++ +---------------+------+-------+ RIGHT VENTRICLE               +---------------+------+-------+ TAPSE (M-mode):1.6 cm2.37 cm +---------------+------+-------+ +------------------+------------++ AORTIC VALVE                   +------------------+------------++ AV Area (Vmax):   2.12 cm     +------------------+------------++ AV Area (Vmean):  2.12 cm     +------------------+------------++ AV Area (VTI):    2.03 cm     +------------------+------------++ AV Vmax:          160.50 cm/s  +------------------+------------++ AV Vmean:         100.550 cm/s +------------------+------------++ AV VTI:           0.269 m      +------------------+------------++ AV Peak Grad:     10.3 mmHg    +------------------+------------++ AV Mean Grad:     5.5 mmHg     +------------------+------------++ LVOT Vmax:        89.65 cm/s   +------------------+------------++ LVOT Vmean:       56.000 cm/s  +------------------+------------++ LVOT VTI:         0.144 m      +------------------+------------++ LVOT/AV VTI ratio:0.54         +------------------+------------++ AR PHT:           624 msec     +------------------+------------++  +--------------+-------++ AORTA                 +--------------+-------++ Ao Sinus diam:3.05 cm +--------------+-------++ Ao STJ diam:  2.9 cm  +--------------+-------++ +--------------+----------++     +---------------+-----------++ MITRAL VALVE                 TRICUSPID VALVE            +--------------+----------++     +---------------+-----------++ MV Area (PHT):2.24 cm       TR Peak grad:  12.8 mmHg   +--------------+----------++     +---------------+-----------++ MV Peak grad: 13.0 mmHg      TR Vmax:       179.00 cm/s +--------------+----------++     +---------------+-----------++ MV Mean grad: 5.0 mmHg   +--------------+----------++     +--------------+-------+ MV Vmax:      1.81 m/s       SHUNTS                +--------------+----------++     +--------------+-------+  MV Vmean:     104.0 cm/s  Systemic VTI: 0.14 m  +--------------+----------++     +--------------+-------+ MV VTI:       0.39 m         Systemic Diam:2.20 cm +--------------+----------++     +--------------+-------+ MV PHT:       98.02 msec +--------------+----------++ MV Decel Time:338 msec   +--------------+----------++ +----------------+-----------++ MR Peak grad:   91.4 mmHg   +----------------+-----------++ MR Mean grad:   53.0 mmHg   +----------------+-----------++ MR Vmax:        478.00 cm/s +----------------+-----------++ MR Vmean:       340.5 cm/s  +----------------+-----------++ MR PISA:        2.26 cm    +----------------+-----------++ MR PISA Eff ROA:16 mm      +----------------+-----------++ MR PISA Radius: 0.60 cm     +----------------+-----------++ +--------------+----------++ MV E velocity:92.00 cm/s +--------------+----------++ MV A velocity:24.70 cm/s +--------------+----------++ MV E/A ratio: 3.72       +--------------+----------++  Anice Paganini Electronically signed by Anice Paganini Signature Date/Time: 10/07/2023/1:22:15 PM    Final     Medications:   Scheduled Medications:  acetaminophen  1,000 mg Oral Q6H   Or   acetaminophen (TYLENOL) oral liquid 160 mg/5 mL  1,000 mg Per Tube Q6H   aspirin  81 mg Oral Once   bisacodyl  10 mg Oral Daily   Or   bisacodyl  10 mg Rectal Daily   busPIRone  5 mg Oral TID   Chlorhexidine Gluconate Cloth  6 each Topical Daily   docusate sodium  200 mg Oral Daily   DULoxetine  40 mg Oral Daily   furosemide  40 mg Intravenous Once   insulin aspart  0-24 Units Subcutaneous Q4H   metoCLOPramide (REGLAN) injection  10 mg Intravenous Q6H   metoprolol tartrate  12.5 mg Oral BID   Or   metoprolol tartrate  12.5 mg Per Tube BID   [START ON 10/09/2023] pantoprazole  40 mg Oral Daily   pantoprazole (PROTONIX) IV  40 mg Intravenous QHS   rosuvastatin  40 mg Oral Daily   sodium chloride flush  3 mL  Intravenous Q12H   sodium chloride flush  3-10 mL Intravenous Q12H   sodium chloride flush  3-10 mL Intravenous Q12H   sodium chloride flush  3-10 mL Intravenous Q12H    Infusions:  sodium chloride 10 mL/hr at 10/08/23 0600   albumin human 999 mL/hr at 10/07/23 1400   dexmedetomidine (PRECEDEX) IV infusion Stopped (10/07/23 1334)   insulin 1.3 Units/hr (10/08/23 0600)   levofloxacin (LEVAQUIN) IV     nitroGLYCERIN Stopped (10/07/23 1215)   phenylephrine (NEO-SYNEPHRINE) Adult infusion Stopped (10/07/23 1313)    PRN Medications: albumin human, cyclobenzaprine, dextrose, metoprolol tartrate, midazolam, morphine injection, ondansetron (ZOFRAN) IV, oxyCODONE, sodium chloride flush, sodium chloride flush, sodium chloride flush, sodium chloride flush, traMADol  Assessment/Plan  1. Mitral regurgitation: TEE 3/12 with severe MR, rheumatic-appearing valve.  No mitral stenosis.  - S/p MVR, pulmonary vein isolation MAZE and LAA occlusion 10/07/23. POD # 1 - Stable off Neo - Chest tubes per TCTS  2. Atrial fibrillation: Paroxysmal. Maintaining NSR - Now off amiodarone - Plan to start [facility] once CT and pacing wires out - S/p pulmonary vein isolation MAZE 10/07/23  3. 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    - Volume mildly elevated. 40 IV lasix x1 this morning.  - Stopped Entresto pre-surgery.  Restart bidil 1/2 tab TID today - Holding spiro for now. K  5.2 today - Stop BB with underlying sinus arrest with bigeminy - Plan to pull swann today  4. Intubated - Extubated post-op  If she's more mobile later today can remove foley. Mobilize.    CRITICAL CARE Performed by: Alen Bleacher  Total critical care time: 14 minutes  Critical care time was exclusive of separately billable procedures and treating other patients.  Critical care was necessary to treat or prevent imminent or life-threatening deterioration.  Critical care was time spent  personally by me on the following activities: development of treatment plan with patient and/or surrogate as well as nursing, discussions with consultants, evaluation of patient's response to treatment, examination of patient, obtaining history from patient or surrogate, ordering and performing treatments and interventions, ordering and review of laboratory studies, ordering and review of radiographic studies, pulse oximetry and re-evaluation of patient's condition.   Length of Stay: 5  Alen Bleacher, NP  10/08/2023, 7:10 AM  Advanced Heart Failure Team Pager [phone] (M-F; 7a - 5p)  Please contact [facility] for night-coverage after hours (5p -7a ) and weekends on [email or web address]  Agree with above. POD #1 MVR/Maze. Off pressors. Ernestine Conrad numbers reviewed personally. Stable  Remains AV paced. BP high. K 5.2  Chest sore  General:  Sitting up in bed. No resp difficulty HEENT: normal Neck: supple. + swan. Carotids 2+ bilat; no bruits. No lymphadenopathy or thryomegaly appreciated. Cor: Sternal dressing ok  Regular rate & rhythm. Mechanical s1 Lungs: clear Abdomen: soft, nontender, nondistended. Hypoactive BS Extremities: no cyanosis, clubbing, rash, edema Neuro: alert & orientedx3, cranial nerves grossly intact. moves all 4 extremities w/o difficulty. Affect pleasant  Ernestine Conrad numbers look good off pressors. Volume up. Will give IV lasix. Can pull swan. Leave introduced one more day.   Start warfarin once EPW wires out per TCTS.   Add Bidil.   CRITICAL CARE Performed by: Arvilla Meres  Total critical care time: 40 minutes  Critical care time was exclusive of separately billable procedures and treating other patients.  Critical care was necessary to treat or prevent imminent or life-threatening deterioration.  Critical care was time spent personally by me (independent of midlevel providers or residents) on the following activities: development of treatment plan with patient and/or  surrogate as well as nursing, discussions with consultants, evaluation of patient's response to treatment, examination of patient, obtaining history from patient or surrogate, ordering and performing treatments and interventions, ordering and review of laboratory studies, ordering and review of radiographic studies, pulse oximetry and re-evaluation of patient's condition.  Arvilla Meres, MD  9:15 AM

## 2023-10-09 ENCOUNTER — Inpatient Hospital Stay (HOSPITAL_COMMUNITY)

## 2023-10-09 ENCOUNTER — Other Ambulatory Visit: Payer: Self-pay | Admitting: Cardiology

## 2023-10-09 DIAGNOSIS — I480 Paroxysmal atrial fibrillation: Secondary | ICD-10-CM

## 2023-10-09 DIAGNOSIS — Z952 Presence of prosthetic heart valve: Secondary | ICD-10-CM | POA: Diagnosis not present

## 2023-10-09 DIAGNOSIS — J9690 Respiratory failure, unspecified, unspecified whether with hypoxia or hypercapnia: Secondary | ICD-10-CM

## 2023-10-09 DIAGNOSIS — I340 Nonrheumatic mitral (valve) insufficiency: Secondary | ICD-10-CM | POA: Diagnosis not present

## 2023-10-09 LAB — BASIC METABOLIC PANEL
Anion gap: 6 (ref 5–15)
BUN: 21 mg/dL — ABNORMAL HIGH (ref 6–20)
CO2: 27 mmol/L (ref 22–32)
Calcium: 8.6 mg/dL — ABNORMAL LOW (ref 8.9–10.3)
Chloride: 96 mmol/L — ABNORMAL LOW (ref 98–111)
Creatinine, Ser: 1.01 mg/dL — ABNORMAL HIGH (ref 0.44–1.00)
GFR, Estimated: >60 mL/min (ref >60)
Glucose, Bld: 126 mg/dL — ABNORMAL HIGH (ref 70–99)
Potassium: 5.6 mmol/L — ABNORMAL HIGH (ref 3.5–5.1)
Sodium: 129 mmol/L — ABNORMAL LOW (ref 135–145)

## 2023-10-09 LAB — CBC
HCT: 31.1 % — ABNORMAL LOW (ref 36.0–46.0)
Hemoglobin: 9.8 g/dL — ABNORMAL LOW (ref 12.0–15.0)
MCH: 28.3 pg (ref 26.0–34.0)
MCHC: 31.5 g/dL (ref 30.0–36.0)
MCV: 89.9 fL (ref 80.0–100.0)
Platelets: 78 10*3/uL — ABNORMAL LOW (ref 150–400)
RBC: 3.46 MIL/uL — ABNORMAL LOW (ref 3.87–5.11)
RDW: 15.5 % (ref 11.5–15.5)
WBC: 26.6 10*3/uL — ABNORMAL HIGH (ref 4.0–10.5)
nRBC: 0 % (ref 0.0–0.2)

## 2023-10-09 LAB — GLUCOSE, CAPILLARY
Glucose-Capillary: 113 mg/dL — ABNORMAL HIGH (ref 70–99)
Glucose-Capillary: 116 mg/dL — ABNORMAL HIGH (ref 70–99)
Glucose-Capillary: 142 mg/dL — ABNORMAL HIGH (ref 70–99)
Glucose-Capillary: 145 mg/dL — ABNORMAL HIGH (ref 70–99)
Glucose-Capillary: 151 mg/dL — ABNORMAL HIGH (ref 70–99)
Glucose-Capillary: 186 mg/dL — ABNORMAL HIGH (ref 70–99)

## 2023-10-09 MED ORDER — WARFARIN - PHYSICIAN DOSING INPATIENT: Freq: Every day | Status: DC

## 2023-10-09 MED ORDER — LORATADINE 10 MG PO TABS
10.0000 mg | ORAL_TABLET | Freq: Every day | ORAL | Status: DC
  Administered 2023-10-09 – 2023-10-14 (×6): 10 mg via ORAL
  Filled 2023-10-09 (×6): qty 1

## 2023-10-09 MED ORDER — GUAIFENESIN 100 MG/5ML PO LIQD
5.0000 mL | ORAL | Status: DC | PRN
  Administered 2023-10-09 – 2023-10-11 (×7): 5 mL via ORAL
  Filled 2023-10-09 (×4): qty 5
  Filled 2023-10-09 (×2): qty 10
  Filled 2023-10-09: qty 5

## 2023-10-09 MED ORDER — FUROSEMIDE 10 MG/ML IJ SOLN
60.0000 mg | Freq: Two times a day (BID) | INTRAMUSCULAR | Status: AC
  Administered 2023-10-09 (×2): 60 mg via INTRAVENOUS
  Filled 2023-10-09 (×2): qty 6

## 2023-10-09 MED ORDER — SODIUM ZIRCONIUM CYCLOSILICATE 10 G PO PACK
10.0000 g | PACK | Freq: Once | ORAL | Status: AC
  Administered 2023-10-09: 10 g via ORAL
  Filled 2023-10-09: qty 1

## 2023-10-09 MED ORDER — WARFARIN SODIUM 2 MG PO TABS
2.0000 mg | ORAL_TABLET | Freq: Once | ORAL | Status: AC
  Administered 2023-10-09: 2 mg via ORAL
  Filled 2023-10-09: qty 1

## 2023-10-09 MED FILL — Sodium Chloride IV Soln 0.9%: INTRAVENOUS | Qty: 2000 | Status: AC

## 2023-10-09 MED FILL — Electrolyte-R (PH 7.4) Solution: INTRAVENOUS | Qty: 4000 | Status: AC

## 2023-10-09 MED FILL — Sodium Bicarbonate IV Soln 8.4%: INTRAVENOUS | Qty: 50 | Status: AC

## 2023-10-09 MED FILL — Sodium Chloride IV Soln 0.9%: INTRAVENOUS | Qty: 300 | Status: CN

## 2023-10-09 MED FILL — Heparin Sodium (Porcine) Inj 1000 Unit/ML: INTRAMUSCULAR | Qty: 10 | Status: AC

## 2023-10-09 NOTE — Progress Notes (Addendum)
 NAME:  Sandra Hines, MRN:  [id number], DOB:  07-21-1971, LOS: 6 ADMISSION DATE:  10/03/2023, CONSULTATION DATE:  10/07/2023 REFERRING MD: Dr. Leafy Ro, CHIEF COMPLAINT: Status post mitral valve replacement  History of Present Illness:  53 year old female who presented with shortness of breath, generalized weakness and malaise and palpitation at [facility], noted to be in hypertensive emergency she was diuresed and blood pressure was brought under control, also she was noted to be in A-fib with RVR heart rate ranging in 180s, she did not respond to Cardizem and then was given amiodarone with conversion to sinus rhythm.  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  Symptomatically she was treated, underwent cardiac cath which showed no obstructive coronary artery disease, patient was transferred to [facility], today she underwent mechanical mitral valve replacement and pulmonary vein isolation maze with occlusion of left atrial appendage.  Patient remained intubated was transferred to ICU.  PCCM was consulted for help evaluation medical management  Pertinent  Medical History  Hypertension Hyperlipidemia Tobacco dependence Cerebral aneurysm status postrepair at [facility] in 2019   Significant Hospital Events: Including procedures, antibiotic start and stop dates in addition to other pertinent events     Interim History / Subjective:  Patient stated feeling better, pain is controlled No overnight issues Appetite is coming back, no bowel movement yet Remain afebrile, though white count increased to 26   Objective   Blood pressure 105/67, pulse 60, temperature 97.8 F (36.6 C), temperature source Oral, resp. rate (!) 28, height 5\' 7"  (1.702 m), weight 95.5 kg, SpO2 94%.        Intake/Output Summary (Last 24 hours) at 10/09/2023 0823 Last data filed at 10/09/2023 0600 Gross per 24 hour  Intake 851.62 ml  Output 1330 ml  Net -478.38 ml   Filed  Weights   10/07/23 0500 10/08/23 0429 10/09/23 0500  Weight: 88.6 kg 96.7 kg 95.5 kg    Examination: General: Middle-age female, lying on the bed HEENT: /AT, eyes anicteric.  moist mucus membranes Neuro: Alert, awake following commands Chest: Central sternotomy incision is clean and dry coarse breath sounds, no wheezes or rhonchi.  Mediastinal and chest tube in place Heart: Regular rate and rhythm, no murmurs or gallops Abdomen: Soft, nontender, nondistended, bowel sounds present Skin: No rash  Labs and images reviewed WBC 26.6 X-ray chest reviewed myself: Showing improved aeration and decreased bilateral infiltrates  Resolved Hospital Problem list     Assessment & Plan:  Severe mitral regurgitation is status post MVR Paroxysmal A-fib status post maze with LAA closure Continue aspirin and statin Holding beta-blocker and amiodarone due to bradycardia, now heart rate is in 60s Chest tube management TCTS Chest tube output 230 cc in last 24h, defer to TCTS regarding removal of chest tubes Continue pain control with tramadol, oxycodone and morphine Closely monitor chest tube output Started on low-dose Coumadin  Acute respiratory insufficiency, postop Remain on 4 L nasal cannula oxygen Encourage incentive spirometry, flutter valve and ambulation She walked in the hallway once today  Hypertension Continue BiDil, blood pressure is controlled  Hyperlipidemia Continue Crestor  Prediabetes Patient hemoglobin A1c is 5.8 Continue sliding scale insulin CBG goal 140-180, currently at goal  Expected perioperative blood loss anemia Thrombocytopenia due to CPB Monitor H/H and PLT counts Platelet count is slowly improving, currently in 70s  Obesity Diet and exercise counseling provided  Hyponatremia Closely monitor electrolytes   Best Practice (right click and "Reselect  all SmartList Selections" daily)   Diet/type: Regular consistency DVT prophylaxis: Coumadin GI  prophylaxis: PPI Lines: N/A Foley: N/A Code Status:  full code Last date of multidisciplinary goals of care discussion [Per primary team]   Labs   CBC: Recent Labs  Lab 10/03/23 0447 10/03/23 0810 10/07/23 1150 10/07/23 1336 10/07/23 1609 10/07/23 1704 10/08/23 0429 10/08/23 1807 10/09/23 0500  WBC 9.8   < > 16.8*  --  16.9*  --  15.9* 24.4* 26.6*  NEUTROABS 5.6  --   --   --   --   --   --   --   --   HGB 11.6*   < > 8.9*   < > 10.5* 11.2* 10.2* 10.0* 9.8*  HCT 35.3*   < > 28.0*   < > 33.6* 33.0* 33.0* 32.0* 31.1*  MCV 85.3   < > 89.7  --  88.9  --  89.9 88.9 89.9  PLT 206   < > 58*  --  75*  --  71* 77* 78*   < > = values in this interval not displayed.    Basic Metabolic Panel: Recent Labs  Lab 10/03/23 0447 10/03/23 0810 10/07/23 0417 10/07/23 0753 10/07/23 1024 10/07/23 1027 10/07/23 1525 10/07/23 1704 10/08/23 0429 10/08/23 1807 10/09/23 0500  NA 137   < > 132*   < > 132*   < > 134* 133* 130* 128* 129*  K 4.2   < > 5.1   < > 5.1   < > 6.1* 5.9* 5.2* 5.5* 5.6*  CL 105   < > 99   < > 102  --  104  --  99 95* 96*  CO2 22   < > 21*  --   --   --  22  --  23 23 27   GLUCOSE 151*   < > 145*   < > 125*  --  129*  --  126* 148* 126*  BUN 19   < > 25*   < > 27*  --  24*  --  18 19 21*  CREATININE 0.85   < > 1.40*   < > 1.60*  --  1.43*  --  1.02* 0.96 1.01*  CALCIUM 8.6*   < > 9.3  --   --   --  8.7*  --  8.6* 8.7* 8.6*  MG  --   --   --   --   --   --  3.9*  --  2.5* 2.2  --   PHOS 3.4  --   --   --   --   --   --   --   --   --   --    < > = values in this interval not displayed.   GFR: Estimated Creatinine Clearance: 79.1 mL/min (A) (by C-G formula based on SCr of 1.01 mg/dL (H)). Recent Labs  Lab 10/07/23 1609 10/08/23 0429 10/08/23 1807 10/09/23 0500  WBC 16.9* 15.9* 24.4* 26.6*    Liver Function Tests: Recent Labs  Lab 10/03/23 0447  ALBUMIN 2.9*   No results for input(s): "LIPASE", "AMYLASE" in the last 168 hours. No results for input(s):  "AMMONIA" in the last 168 hours.  ABG    Component Value Date/Time   PHART 7.327 (L) 10/07/2023 1704   PCO2ART 42.6 10/07/2023 1704   PO2ART 99 10/07/2023 1704   HCO3 22.5 10/07/2023 1704   TCO2 24 10/07/2023 1704   ACIDBASEDEF 4.0 (H) 10/07/2023  1704   O2SAT 97 10/07/2023 1704     Coagulation Profile: Recent Labs  Lab 10/07/23 1150  INR 1.7*    Cardiac Enzymes: No results for input(s): "CKTOTAL", "CKMB", "CKMBINDEX", "TROPONINI" in the last 168 hours.  HbA1C: Hgb A1c MFr Bld  Date/Time Value Ref Range Status  07/19/2023 01:54 PM 5.8 (H) 4.8 - 5.6 % Final    Comment:             Prediabetes: 5.7 - 6.4          Diabetes: >6.4          Glycemic control for adults with diabetes: <7.0     CBG: Recent Labs  Lab 10/08/23 1535 10/08/23 1956 10/08/23 2355 10/09/23 0426 10/09/23 0758  GLUCAP 157* 146* 150* 116* 186*      Cheri Fowler, MD [facility] See Amion for pager If no response to pager, please call [phone] until 7pm After 7pm, Please call E-link [phone]

## 2023-10-09 NOTE — Progress Notes (Addendum)
 TCTS DAILY ICU PROGRESS NOTE                   [address]            [place] [id number]          [phone]   2 Days Post-Op Procedure(s) (LRB): MITRAL VALVE REPLACEMENT USING SJM MASTERS SERIES MECHANICAL MITRAL HEART VALVE SIZE (N/A) MAZE PROCEDURE (N/A) ECHOCARDIOGRAM, TRANSESOPHAGEAL, INTRAOPERATIVE (N/A) CLIPPING, LEFT ATRIAL APPENDAGE USING MEDTRONIC PENDITURE LAA EXCLUSION SYSTEM SIZE  Total Length of Stay:  LOS: 6 days   Subjective: Feels fairly well  Objective: Vital signs in last 24 hours: Temp:  [97.8 F (36.6 C)-99 F (37.2 C)] 98 F (36.7 C) (03/19 0000) Pulse Rate:  [46-70] 57 (03/19 0600) Cardiac Rhythm: Atrial paced (03/18 2000) Resp:  [14-39] 16 (03/19 0600) BP: (103-147)/(48-100) 127/74 (03/19 0600) SpO2:  [89 %-96 %] 95 % (03/19 0600) Weight:  [95.5 kg] 95.5 kg (03/19 0500)  Filed Weights   10/07/23 0500 10/08/23 0429 10/09/23 0500  Weight: 88.6 kg 96.7 kg 95.5 kg    Weight change: -1.179 kg   Hemodynamic parameters for last 24 hours: PAP: (54)/(23) 54/23 CVP:  [19 mmHg] 19 mmHg  Intake/Output from previous day: 03/18 0701 - 03/19 0700 In: 874.2 [P.O.:680; I.V.:85.5; IV Piggyback:108.8] Out: 1420 [Urine:1190; Chest Tube:230]  Intake/Output this shift: No intake/output data recorded.  Current Meds: Scheduled Meds:  acetaminophen  1,000 mg Oral Q6H   Or   acetaminophen (TYLENOL) oral liquid 160 mg/5 mL  1,000 mg Per Tube Q6H   bisacodyl  10 mg Oral Daily   Or   bisacodyl  10 mg Rectal Daily   busPIRone  5 mg Oral TID   Chlorhexidine Gluconate Cloth  6 each Topical Daily   docusate sodium  200 mg Oral Daily   DULoxetine  40 mg Oral Daily   insulin aspart  0-24 Units Subcutaneous Q4H   isosorbide-hydrALAZINE  0.5 tablet Oral TID   pantoprazole  40 mg Oral Daily   pantoprazole (PROTONIX) IV  40 mg Intravenous QHS   rosuvastatin  40 mg Oral Daily   Continuous Infusions:  albumin human 999 mL/hr at  10/07/23 1400   PRN Meds:.albumin human, cyclobenzaprine, metoprolol tartrate, midazolam, morphine injection, ondansetron (ZOFRAN) IV, oxyCODONE, traMADol  General appearance: alert, cooperative, and no distress Heart: irregularly irregular rhythm Lungs: clear to auscultation bilaterally Abdomen: benign Extremities: no edema Wound: dressing CDI  Lab Results: CBC: Recent Labs    10/08/23 0429 10/08/23 1807  WBC 15.9* 24.4*  HGB 10.2* 10.0*  HCT 33.0* 32.0*  PLT 71* 77*   BMET:  Recent Labs    10/08/23 1807 10/09/23 0500  NA 128* 129*  K 5.5* 5.6*  CL 95* 96*  CO2 23 27  GLUCOSE 148* 126*  BUN 19 21*  CREATININE 0.96 1.01*  CALCIUM 8.7* 8.6*    CMET: Lab Results  Component Value Date   WBC 24.4 (H) 10/08/2023   HGB 10.0 (L) 10/08/2023   HCT 32.0 (L) 10/08/2023   PLT 77 (L) 10/08/2023   GLUCOSE 126 (H) 10/09/2023   CHOL 175 09/26/2023   TRIG 79 09/26/2023   HDL 41 09/26/2023   LDLCALC 118 (H) 09/26/2023   ALT 81 (H) 09/30/2023   AST 29 09/30/2023   NA 129 (L) 10/09/2023   K 5.6 (H) 10/09/2023   CL 96 (L) 10/09/2023   CREATININE 1.01 (H) 10/09/2023   BUN 21 (H) 10/09/2023  CO2 27 10/09/2023   TSH 1.091 09/25/2023   INR 1.7 (H) 10/07/2023   HGBA1C 5.8 (H) 07/19/2023      PT/INR:  Recent Labs    10/07/23 1150  LABPROT 19.8*  INR 1.7*   Radiology: No results found.   Assessment/Plan: S/P Procedure(s) (LRB): MITRAL VALVE REPLACEMENT USING SJM MASTERS SERIES MECHANICAL MITRAL HEART VALVE SIZE (N/A) MAZE PROCEDURE (N/A) ECHOCARDIOGRAM, TRANSESOPHAGEAL, INTRAOPERATIVE (N/A) CLIPPING, LEFT ATRIAL APPENDAGE USING MEDTRONIC PENDITURE LAA EXCLUSION SYSTEM SIZE POD#2  1 Tm 99, HR 46-70, has been a paced at times, S BP 103-147- no inotropes or vasopressors- amio/beta blocker on hold 2 O2 sats good on 4 liters 3 good UOP, some not recorded, weight approx 7 kg>preop 4 BS mostly well controlled, routine protocols-, pre-diabetic 5  hyponatremia- trending lower, sodium 129- care with diuretics- defer to AHF  6 normal renal fxn, monitor potassium closely- K+ 5.6 this am 7 leukocytosis trending higher, WBC 26.6 - monitor, hopefully reactive inflammation 9 PCCM/AHF assisting w/ management- appreciate 10 CT 230 ml/24h- likely remove today 11 start low dose coumadin, 2 mh, hold lovenox w/ thrombocytopenia 12pulm hygiene and rehab- routine  Rowe Clack PA-C Pager [phone] 10/09/2023 7:19 AM  Agree with above

## 2023-10-09 NOTE — Progress Notes (Signed)
 Patient ID: Sandra Hines, female   DOB: [birth year], 53 y.o.   MRN: [id number]  TCTS Evening Rounds:  Hemodynamically stable in atrial fib with rate 60's most of the day. Not on any antiarrhythmics.  Ambulated.  UO good.  CT's out.

## 2023-10-09 NOTE — Progress Notes (Addendum)
 Patient ID: TOMEKO SCOVILLE, female   DOB: 03/09/1971, 53 y.o.   MRN: [id number]     Advanced Heart Failure Rounding Note  Cardiologist: Julien Nordmann, MD  Chief Complaint: mitral regurgitation Subjective:   3/17: S/p MVR, pulmonary vein isolation MAZE and LAA occlusion 10/07/23. POD # 2  Feels much better today. Pain improving.   Objective:   Weight Range: 95.5 kg Body mass index is 32.97 kg/m.   Vital Signs:   Temp:  [97.8 F (36.6 C)-99 F (37.2 C)] 98 F (36.7 C) (03/19 0000) Pulse Rate:  [46-70] 57 (03/19 0600) Resp:  [14-39] 16 (03/19 0600) BP: (103-147)/(48-100) 127/74 (03/19 0600) SpO2:  [89 %-96 %] 95 % (03/19 0600) Weight:  [95.5 kg] 95.5 kg (03/19 0500) Last BM Date : 10/05/23  Weight change: Filed Weights   10/07/23 0500 10/08/23 0429 10/09/23 0500  Weight: 88.6 kg 96.7 kg 95.5 kg   Intake/Output:   Intake/Output Summary (Last 24 hours) at 10/09/2023 0754 Last data filed at 10/09/2023 0600 Gross per 24 hour  Intake 874.22 ml  Output 1420 ml  Net -545.78 ml   CVP pending Physical Exam    General:  well appearing.  No respiratory difficulty HEENT: RIJ introducer Neck: supple. JVD difficult to see, does not appear significantly elevated.  Cor: PMI nondisplaced. Regular rate & rhythm. No rubs, gallops or murmurs. +CT Lungs: clear Abdomen: soft, nontender, nondistended. Good bowel sounds. Extremities: no cyanosis, clubbing, rash, edema  Neuro: alert & oriented x 3. Moves all 4 extremities w/o difficulty. Affect pleasant.   Telemetry   SB 50s (Personally reviewed)    Labs   CBC Recent Labs    10/08/23 1807 10/09/23 0500  WBC 24.4* 26.6*  HGB 10.0* 9.8*  HCT 32.0* 31.1*  MCV 88.9 89.9  PLT 77* 78*   Basic Metabolic Panel Recent Labs    62/13/08 0429 10/08/23 1807 10/09/23 0500  NA 130* 128* 129*  K 5.2* 5.5* 5.6*  CL 99 95* 96*  CO2 23 23 27   GLUCOSE 126* 148* 126*  BUN 18 19 21*  CREATININE 1.02* 0.96 1.01*  CALCIUM 8.6* 8.7* 8.6*   MG 2.5* 2.2  --    Liver Function Tests No results for input(s): "AST", "ALT", "ALKPHOS", "BILITOT", "PROT", "ALBUMIN" in the last 72 hours.  No results for input(s): "LIPASE", "AMYLASE" in the last 72 hours. Cardiac Enzymes No results for input(s): "CKTOTAL", "CKMB", "CKMBINDEX", "TROPONINI" in the last 72 hours.  BNP: BNP (last 3 results) Recent Labs    09/25/23 1711  BNP 700.0*   ProBNP (last 3 results) No results for input(s): "PROBNP" in the last 8760 hours.  D-Dimer No results for input(s): "DDIMER" in the last 72 hours. Hemoglobin A1C No results for input(s): "HGBA1C" in the last 72 hours. Fasting Lipid Panel No results for input(s): "CHOL", "HDL", "LDLCALC", "TRIG", "CHOLHDL", "LDLDIRECT" in the last 72 hours. Thyroid Function Tests No results for input(s): "TSH", "T4TOTAL", "T3FREE", "THYROIDAB" in the last 72 hours.  Invalid input(s): "FREET3"  Other results:  Imaging   No results found.  Medications:   Scheduled Medications:  acetaminophen  1,000 mg Oral Q6H   Or   acetaminophen (TYLENOL) oral liquid 160 mg/5 mL  1,000 mg Per Tube Q6H   bisacodyl  10 mg Oral Daily   Or   bisacodyl  10 mg Rectal Daily   busPIRone  5 mg Oral TID   Chlorhexidine Gluconate Cloth  6 each Topical Daily   docusate sodium  200  mg Oral Daily   DULoxetine  40 mg Oral Daily   insulin aspart  0-24 Units Subcutaneous Q4H   isosorbide-hydrALAZINE  0.5 tablet Oral TID   pantoprazole  40 mg Oral Daily   pantoprazole (PROTONIX) IV  40 mg Intravenous QHS   rosuvastatin  40 mg Oral Daily    Infusions:  albumin human 999 mL/hr at 10/07/23 1400    PRN Medications: albumin human, cyclobenzaprine, metoprolol tartrate, midazolam, morphine injection, ondansetron (ZOFRAN) IV, oxyCODONE, traMADol Assessment/Plan  1. Mitral regurgitation: TEE 3/12 with severe MR, rheumatic-appearing valve.  No mitral stenosis.  - S/p MVR, pulmonary vein isolation MAZE and LAA occlusion 10/07/23. POD #  2 - Stable off Neo - Chest tubes per TCTS, plan to come out today.  - Starting warfarin today per TCTS  2. Atrial fibrillation: Paroxysmal. Maintaining NSR - Now off amiodarone - Plan to start [facility] once CT and pacing wires out - S/p pulmonary vein isolation MAZE 10/07/23  3. 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    - Volume difficult to see. Weight down 3lbs but still up 9lbs from pre-op weight. -1.1L UOP yesterday with 40 IV lasix x1 yesterday. CVP 14 with a good waveform. Give 60 IV BID lasix today.  - Stopped Entresto pre-surgery.  Continue bidil 1/2 tab TID  - Holding spiro for now. K 5.6 today - Holding BB with underlying sinus arrest with bigeminy  4. Intubated - Extubated post-op  5. Hyperkalemia - K 5.6 today - Lokelma 10g today - BMET this afternoon.   Mobilize.   Length of Stay: 6  Alen Bleacher, NP  10/09/2023, 7:54 AM  Advanced Heart Failure Team Pager [phone] (M-F; 7a - 5p)  Please contact [facility] for night-coverage after hours (5p -7a ) and weekends on [email or web address]  Patient seen and examined with the above-signed Advanced Practice Provider and/or Housestaff. I personally reviewed laboratory data, imaging studies and relevant notes. I independently examined the patient and formulated the important aspects of the plan. I have edited the note to reflect any of my changes or salient points. I have personally discussed the plan with the patient and/or family.  POD #2 MVR/Maze  Sitting up. Chest sore. Denies SOB. Remains in NSR. CT/EPW out today. Warfarin started.   JVP up   General:  Sitting up in bed. No resp difficulty HEENT: normal Neck: supple. + JVD. Carotids 2+ bilat; no bruits. No lymphadenopathy or thryomegaly appreciated. Cor: Sternal wound ok Regular rate & rhythm. Mech s1 Lungs: clear Abdomen: soft, nontender, nondistended. No hepatosplenomegaly. No bruits or masses. Good bowel  sounds. Extremities: no cyanosis, clubbing, rash, 1+  edema Neuro: alert & orientedx3, cranial nerves grossly intact. moves all 4 extremities w/o difficulty. Affect pleasant  Improving post-op. Remains volume overloaded. Start IV lasix. Start warfarin per TCTS. Mobilize. Encourage IS.   Low threshold to add amio.   Arvilla Meres, MD  4:03 PM

## 2023-10-09 NOTE — TOC Progression Note (Signed)
 Transition of Care [facility]) - Progression Note    Patient Details  Name: Sandra Hines MRN: [id number] Date of Birth: [birth year]  [facility] CM/SW Contact  Nicanor Bake Phone Number: [phone] 10/09/2023, 11:07 AM  Clinical Narrative:   10:30 AM- HF CSW attempted to meet with pt at bedside. Pt was being seen by nursing staff. CSW will follow up at a more appropriate time.   TOC will continue following.          Expected Discharge Plan and Services                                               Social Determinants of Health (SDOH) Interventions SDOH Screenings   Food Insecurity: No Food Insecurity (10/03/2023)  Housing: Low Risk  (10/03/2023)  Transportation Needs: No Transportation Needs (10/03/2023)  Utilities: Not At Risk (10/03/2023)  Depression (PHQ2-9): High Risk (09/25/2023)  Financial Resource Strain: Medium Risk (09/30/2023)  Social Connections: Moderately Integrated (10/03/2023)  Tobacco Use: High Risk (10/07/2023)    Readmission Risk Interventions     No data to display

## 2023-10-10 DIAGNOSIS — R001 Bradycardia, unspecified: Secondary | ICD-10-CM

## 2023-10-10 DIAGNOSIS — E875 Hyperkalemia: Secondary | ICD-10-CM

## 2023-10-10 DIAGNOSIS — I480 Paroxysmal atrial fibrillation: Secondary | ICD-10-CM | POA: Diagnosis not present

## 2023-10-10 DIAGNOSIS — J9690 Respiratory failure, unspecified, unspecified whether with hypoxia or hypercapnia: Secondary | ICD-10-CM | POA: Diagnosis not present

## 2023-10-10 DIAGNOSIS — Z952 Presence of prosthetic heart valve: Secondary | ICD-10-CM | POA: Diagnosis not present

## 2023-10-10 DIAGNOSIS — I340 Nonrheumatic mitral (valve) insufficiency: Secondary | ICD-10-CM | POA: Diagnosis not present

## 2023-10-10 LAB — BASIC METABOLIC PANEL
Anion gap: 12 (ref 5–15)
BUN: 22 mg/dL — ABNORMAL HIGH (ref 6–20)
CO2: 23 mmol/L (ref 22–32)
Calcium: 8.7 mg/dL — ABNORMAL LOW (ref 8.9–10.3)
Chloride: 94 mmol/L — ABNORMAL LOW (ref 98–111)
Creatinine, Ser: 0.96 mg/dL (ref 0.44–1.00)
GFR, Estimated: >60 mL/min (ref >60)
Glucose, Bld: 118 mg/dL — ABNORMAL HIGH (ref 70–99)
Potassium: 4.4 mmol/L (ref 3.5–5.1)
Sodium: 129 mmol/L — ABNORMAL LOW (ref 135–145)

## 2023-10-10 LAB — CBC
HCT: 29 % — ABNORMAL LOW (ref 36.0–46.0)
Hemoglobin: 9.3 g/dL — ABNORMAL LOW (ref 12.0–15.0)
MCH: 27.8 pg (ref 26.0–34.0)
MCHC: 32.1 g/dL (ref 30.0–36.0)
MCV: 86.8 fL (ref 80.0–100.0)
Platelets: 97 10*3/uL — ABNORMAL LOW (ref 150–400)
RBC: 3.34 MIL/uL — ABNORMAL LOW (ref 3.87–5.11)
RDW: 15.4 % (ref 11.5–15.5)
WBC: 26.1 10*3/uL — ABNORMAL HIGH (ref 4.0–10.5)
nRBC: 0 % (ref 0.0–0.2)

## 2023-10-10 LAB — GLUCOSE, CAPILLARY
Glucose-Capillary: 104 mg/dL — ABNORMAL HIGH (ref 70–99)
Glucose-Capillary: 119 mg/dL — ABNORMAL HIGH (ref 70–99)
Glucose-Capillary: 161 mg/dL — ABNORMAL HIGH (ref 70–99)
Glucose-Capillary: 166 mg/dL — ABNORMAL HIGH (ref 70–99)
Glucose-Capillary: 275 mg/dL — ABNORMAL HIGH (ref 70–99)

## 2023-10-10 LAB — PROTIME-INR
INR: 1.4 — ABNORMAL HIGH (ref 0.8–1.2)
Prothrombin Time: 17.6 s — ABNORMAL HIGH (ref 11.4–15.2)

## 2023-10-10 MED ORDER — AMIODARONE HCL IN DEXTROSE 360-4.14 MG/200ML-% IV SOLN
30.0000 mg/h | INTRAVENOUS | Status: AC
  Administered 2023-10-10 – 2023-10-11 (×3): 30 mg/h via INTRAVENOUS
  Filled 2023-10-10 (×2): qty 200

## 2023-10-10 MED ORDER — AMIODARONE HCL IN DEXTROSE 360-4.14 MG/200ML-% IV SOLN
60.0000 mg/h | INTRAVENOUS | Status: AC
  Administered 2023-10-10 (×2): 60 mg/h via INTRAVENOUS
  Filled 2023-10-10 (×2): qty 200

## 2023-10-10 MED ORDER — SODIUM CHLORIDE 0.9% FLUSH
3.0000 mL | Freq: Two times a day (BID) | INTRAVENOUS | Status: DC
  Administered 2023-10-10 – 2023-10-13 (×8): 3 mL via INTRAVENOUS

## 2023-10-10 MED ORDER — POTASSIUM CHLORIDE CRYS ER 20 MEQ PO TBCR
40.0000 meq | EXTENDED_RELEASE_TABLET | Freq: Once | ORAL | Status: AC
  Administered 2023-10-10: 40 meq via ORAL
  Filled 2023-10-10: qty 2

## 2023-10-10 MED ORDER — SODIUM CHLORIDE 0.9 % IV SOLN
2.0000 g | INTRAVENOUS | Status: AC
Start: 2023-10-10 — End: 2023-10-14
  Administered 2023-10-10 – 2023-10-14 (×5): 2 g via INTRAVENOUS
  Filled 2023-10-10 (×5): qty 20

## 2023-10-10 MED ORDER — FUROSEMIDE 10 MG/ML IJ SOLN
60.0000 mg | Freq: Two times a day (BID) | INTRAMUSCULAR | Status: AC
  Administered 2023-10-10 (×2): 60 mg via INTRAVENOUS
  Filled 2023-10-10 (×2): qty 6

## 2023-10-10 MED ORDER — SODIUM CHLORIDE 0.9% FLUSH: 3.0000 mL | INTRAVENOUS | Status: DC | PRN

## 2023-10-10 MED ORDER — ENSURE ENLIVE PO LIQD
237.0000 mL | Freq: Two times a day (BID) | ORAL | Status: DC
  Administered 2023-10-10 – 2023-10-14 (×4): 237 mL via ORAL

## 2023-10-10 MED ORDER — INSULIN ASPART 100 UNIT/ML IJ SOLN
0.0000 [IU] | Freq: Four times a day (QID) | INTRAMUSCULAR | Status: DC
  Administered 2023-10-10: 4 [IU] via SUBCUTANEOUS
  Administered 2023-10-10: 12 [IU] via SUBCUTANEOUS
  Administered 2023-10-11 – 2023-10-12 (×5): 2 [IU] via SUBCUTANEOUS

## 2023-10-10 MED ORDER — AMIODARONE LOAD VIA INFUSION
150.0000 mg | Freq: Once | INTRAVENOUS | Status: AC
  Administered 2023-10-10: 150 mg via INTRAVENOUS
  Filled 2023-10-10: qty 83.34

## 2023-10-10 MED ORDER — AMIODARONE HCL 200 MG PO TABS: 200.0000 mg | ORAL_TABLET | Freq: Two times a day (BID) | ORAL | Status: DC

## 2023-10-10 MED ORDER — WARFARIN SODIUM 2.5 MG PO TABS
2.5000 mg | ORAL_TABLET | Freq: Once | ORAL | Status: AC
  Administered 2023-10-10: 2.5 mg via ORAL
  Filled 2023-10-10: qty 1

## 2023-10-10 MED ORDER — ~~LOC~~ CARDIAC SURGERY, PATIENT & FAMILY EDUCATION: Freq: Once | Status: AC

## 2023-10-10 MED ORDER — SODIUM CHLORIDE 0.9 % IV SOLN: 250.0000 mL | INTRAVENOUS | Status: AC | PRN

## 2023-10-10 NOTE — Progress Notes (Signed)
 NAME:  Sandra Hines, MRN:  [id number], DOB:  [birth year], LOS: 7 ADMISSION DATE:  10/03/2023, CONSULTATION DATE:  10/07/2023 REFERRING MD: Dr. Leafy Ro, CHIEF COMPLAINT: Status post mitral valve replacement  History of Present Illness:  53 year old female who presented with shortness of breath, generalized weakness and malaise and palpitation at [facility], noted to be in hypertensive emergency she was diuresed and blood pressure was brought under control, also she was noted to be in A-fib with RVR heart rate ranging in 180s, she did not respond to Cardizem and then was given amiodarone with conversion to sinus rhythm.  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  Symptomatically she was treated, underwent cardiac cath which showed no obstructive coronary artery disease, patient was transferred to [facility], today she underwent mechanical mitral valve replacement and pulmonary vein isolation maze with occlusion of left atrial appendage.  Patient remained intubated was transferred to ICU.  PCCM was consulted for help evaluation medical management  Pertinent  Medical History  Hypertension Hyperlipidemia Tobacco dependence Cerebral aneurysm status postrepair at [facility] in 2019   Significant Hospital Events: Including procedures, antibiotic start and stop dates in addition to other pertinent events     Interim History / Subjective:  No overnight issues, stated did not sleep well last night Remain afebrile Complaining of cough, white count remain elevated 26 Mediastinal and chest tubes were removed  Objective   Blood pressure (!) 130/119, pulse 77, temperature 98.4 F (36.9 C), temperature source Oral, resp. rate 20, height 5\' 7"  (1.702 m), weight 93.5 kg, SpO2 (!) 81%. CVP:  [17 mmHg-32 mmHg] 32 mmHg      Intake/Output Summary (Last 24 hours) at 10/10/2023 0746 Last data filed at 10/10/2023 0400 Gross per 24 hour  Intake 480 ml  Output 800  ml  Net -320 ml   Filed Weights   10/08/23 0429 10/09/23 0500 10/10/23 0500  Weight: 96.7 kg 95.5 kg 93.5 kg    Examination: General: Middle-aged female, lying on the bed HEENT: [place]/AT, eyes anicteric.  moist mucus membranes Neuro: Alert, awake following commands Chest: Central sternotomy incision looks clean and dry coarse breath sounds, no wheezes or rhonchi Heart: Bradycardic, intermittent junctional rhythm, no murmurs or gallops Abdomen: Soft, nontender, nondistended, bowel sounds present Skin: No rash   Labs reviewed WBC 26.1  Resolved Hospital Problem list     Assessment & Plan:  Severe mitral regurgitation is status post MVR Paroxysmal A-fib status post maze with LAA closure Intermittent junctional rhythm Continue aspirin and statin Started on amiodarone EP cardiology consulted Chest tube and mediastinal tubes were discontinued yesterday Continue pain control with tramadol, oxycodone and morphine Closely monitor chest tube output Started on low-dose Coumadin  Acute respiratory insufficiency, postop Acute bronchitis Remain on 2 L nasal cannula oxygen, titrate with O2 sat goal 92% Encourage incentive spirometry, flutter valve and ambulation She walked in the hallway twice today Started on IV ceftriaxone considering white count remain elevated and she is complaining of cough  Hypertension Continue BiDil Blood pressure remained controlled  Hyperlipidemia Continue Crestor  Prediabetes Blood sugars are controlled A1c is 5.8 Continue sliding scale insulin CBG goal 140-180, currently at goal  Expected perioperative blood loss anemia Thrombocytopenia due to CPB Monitor H/H and PLT counts Platelet count is slowly improving, currently 97  Obesity Diet and exercise counseling provided  Hyponatremia Hyperkalemia Serum sodium remained at 129 Hyperkalemia has corrected Closely monitor electrolytes   Best Practice (right  click and "Reselect all SmartList  Selections" daily)   Diet/type: Regular consistency DVT prophylaxis: Coumadin GI prophylaxis: PPI Lines: N/A Foley: N/A Code Status:  full code Last date of multidisciplinary goals of care discussion [Per primary team]   Labs   CBC: Recent Labs  Lab 10/07/23 1609 10/07/23 1704 10/08/23 0429 10/08/23 1807 10/09/23 0500 10/10/23 0217  WBC 16.9*  --  15.9* 24.4* 26.6* 26.1*  HGB 10.5* 11.2* 10.2* 10.0* 9.8* 9.3*  HCT 33.6* 33.0* 33.0* 32.0* 31.1* 29.0*  MCV 88.9  --  89.9 88.9 89.9 86.8  PLT 75*  --  71* 77* 78* 97*    Basic Metabolic Panel: Recent Labs  Lab 10/07/23 1525 10/07/23 1704 10/08/23 0429 10/08/23 1807 10/09/23 0500 10/10/23 0217  NA 134* 133* 130* 128* 129* 129*  K 6.1* 5.9* 5.2* 5.5* 5.6* 4.4  CL 104  --  99 95* 96* 94*  CO2 22  --  23 23 27 23   GLUCOSE 129*  --  126* 148* 126* 118*  BUN 24*  --  18 19 21* 22*  CREATININE 1.43*  --  1.02* 0.96 1.01* 0.96  CALCIUM 8.7*  --  8.6* 8.7* 8.6* 8.7*  MG 3.9*  --  2.5* 2.2  --   --    GFR: Estimated Creatinine Clearance: 82.3 mL/min (by C-G formula based on SCr of 0.96 mg/dL). Recent Labs  Lab 10/08/23 0429 10/08/23 1807 10/09/23 0500 10/10/23 0217  WBC 15.9* 24.4* 26.6* 26.1*    Liver Function Tests: No results for input(s): "AST", "ALT", "ALKPHOS", "BILITOT", "PROT", "ALBUMIN" in the last 168 hours.  No results for input(s): "LIPASE", "AMYLASE" in the last 168 hours. No results for input(s): "AMMONIA" in the last 168 hours.  ABG    Component Value Date/Time   PHART 7.327 (L) 10/07/2023 1704   PCO2ART 42.6 10/07/2023 1704   PO2ART 99 10/07/2023 1704   HCO3 22.5 10/07/2023 1704   TCO2 24 10/07/2023 1704   ACIDBASEDEF 4.0 (H) 10/07/2023 1704   O2SAT 97 10/07/2023 1704     Coagulation Profile: Recent Labs  Lab 10/07/23 1150 10/10/23 0217  INR 1.7* 1.4*    Cardiac Enzymes: No results for input(s): "CKTOTAL", "CKMB", "CKMBINDEX", "TROPONINI" in the last 168 hours.  HbA1C: Hgb A1c  MFr Bld  Date/Time Value Ref Range Status  07/19/2023 01:54 PM 5.8 (H) 4.8 - 5.6 % Final    Comment:             Prediabetes: 5.7 - 6.4          Diabetes: >6.4          Glycemic control for adults with diabetes: <7.0     CBG: Recent Labs  Lab 10/09/23 1146 10/09/23 1617 10/09/23 1956 10/09/23 2333 10/10/23 0404  GLUCAP 151* 145* 142* 113* 119*      Cheri Fowler, MD [facility] See Amion for pager If no response to pager, please call [phone] until 7pm After 7pm, Please call E-link [phone]

## 2023-10-10 NOTE — Progress Notes (Signed)
 Patient arrived at the unit,CHG bath given,vitals taken ,pt is in amiodarone drip with heart rate in 50s to 60s,vitals taken,CCMD notified,pt oriented to the unit

## 2023-10-10 NOTE — Consult Note (Signed)
 ELECTROPHYSIOLOGY CONSULT NOTE    Patient ID: Sandra Hines MRN: [id number], DOB/AGE: 51/51/1972 51 y.o.  Admit date: 10/03/2023 Date of Consult: 10/10/2023  Primary Physician: Ronnald Ramp, MD Primary Cardiologist: Julien Nordmann, MD  Electrophysiologist: New   Referring Provider: Dr. Leafy Ro  Patient Profile: Sandra Hines is a 53 y.o. female with a history of HFmrEF, severe MR, HTN,HTN, cerebral anuerysm s/p repair and tobacco abuse who is being seen today for the evaluation of post op AF and bradycardia at the request of Dr. Leafy Ro.  HPI:  Sandra Hines is a 53 y.o. female admitted to [facility] 3/5 with HF symptoms. Found to be in AF. Chest CTA showed no PE, arterial HTN, 4.3cm ascending aortic aneurysm, interstitial edema, possible PNA.The patient was given a dose of IV dilt without improvement.   Echo showed EF 45-50%. Started on amiodarone with some improvement.   Transferred to [facility] for optimization and surgical consultation.    TEE 10/02/23 EF EF 55-60% RV low normal severe MR - L/R cath 10/03/23. No CAD  - RA 10 PA 63/28 (40) PCWP 29 with v waves to 70 Fick 4.7/2.3 SVR 1600  Underwent Mechanical MVR 3/17 by Dr. Leafy Ro.  Had some sinus node dysfunction post op and po amiodarone and BB were held.   She has continued to have paroxysms of AF with bradycardia when she converts. EP asked to see for recommendations and consideration of pacing if needed.   Pt feeling OK at rest currently. Somewhat sore, but overall feeling better. Wants to go home.  Hopeful that she will not need a pacemaker.  Denies syncope or SOB.    Labs Potassium4.4 (03/20 0217) Magnesium  2.2 (03/18 1807) Creatinine, ser  0.96 (03/20 0217) PLT  97* (03/20 0217) HGB  9.3* (03/20 0217) WBC 26.1* (03/20 0217)  .     Allergies, Medical, Surgical, Social, and Family Histories have been reviewed and are referenced here-in when relevant for medical decision making.    Physical Exam: Vitals:    10/10/23 0500 10/10/23 0530 10/10/23 0700 10/10/23 0744  BP:   (!) 130/119   Pulse:  70 77   Resp:  (!) 25 20   Temp:    97.8 F (36.6 C)  TempSrc:    Oral  SpO2:  100% (!) 81%   Weight: 93.5 kg     Height:        GEN- NAD, A&O x 3, normal affect HEENT: Normocephalic, atraumatic Lungs- CTAB, Normal effort.  Heart- Regular rate and rhythm, No M/G/R.  GI- Soft, NT, ND.  Extremities- No clubbing, cyanosis, or edema   Radiology/Studies:   Echo 09/27/23 with EF 45-50%, severe MR, mild RV dysfunction.   R/LHC 3/13 with no significant CAD. Low normal to mildly reduced Fick CO/CI.  EKG: 3/18 shows ?AF in 70s with LBBB. Possibly intermittent sinus beats  (personally reviewed)  TELEMETRY:  Paroxysmal AF 70-100s with periods of sinus brady, especially after conversions.  Brief HRs in the 40s with one episode in the 30s, but no prolonged bradycardia. (personally reviewed)  Assessment/Plan:  Paroxysmal AF Underwent MAZE and LAA occlusion at time of MVR Suspect will improve as she gets further out from surgery. Agree with amiodarone, will use IV today.  She had bradycardia initially post op but has not required further pacing.  For the past several days bradycardia has not been prolonged and has primarily been post conversion.   Sinus bradycardia Will follow rates and rhythm for potential pacer. No  urgent indication.  If needs pacing, will need to discuss optimal time frame. Not currently on heparin, coumadin with INR trending up.   Severe MR s/p Mechanical MVR 10/07/23 With MAZE and LAA clipping as well Per primary.   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  HF team following as well.   Dr. Nelly Laurence has seen. Plan amiodarone gtt today and continue to follow.   For questions or updates, please contact [facility] Please consult [email or web address] for contact info under Cardiology/STEMI.  Dustin Flock, PA-C  10/10/2023 7:52 AM

## 2023-10-10 NOTE — Progress Notes (Addendum)
 Patient ID: Sandra Hines, female   DOB: Sep 12, 1970, 53 y.o.   MRN: [id number]     Advanced Heart Failure Rounding Note  Cardiologist: Julien Nordmann, MD  Chief Complaint: mitral regurgitation Subjective:   3/17: S/p MVR, pulmonary vein isolation MAZE and LAA occlusion 10/07/23. POD # 2  Continues to feel better. Overnight with intermittent a fib. Started on amio gtt.   Objective:   Weight Range: 93.5 kg Body mass index is 32.28 kg/m.   Vital Signs:   Temp:  [97.1 F (36.2 C)-98.4 F (36.9 C)] 98.3 F (36.8 C) (03/20 1110) Pulse Rate:  [46-93] 56 (03/20 1000) Resp:  [12-28] 18 (03/20 1000) BP: (97-147)/(59-128) 115/75 (03/20 1000) SpO2:  [81 %-100 %] 93 % (03/20 1000) Weight:  [93.5 kg] 93.5 kg (03/20 0500) Last BM Date : 10/05/23  Weight change: Filed Weights   10/08/23 0429 10/09/23 0500 10/10/23 0500  Weight: 96.7 kg 95.5 kg 93.5 kg   Intake/Output:   Intake/Output Summary (Last 24 hours) at 10/10/2023 1119 Last data filed at 10/10/2023 0800 Gross per 24 hour  Intake 366 ml  Output 700 ml  Net -334 ml   Physical Exam    General:  well appearing.  No respiratory difficulty HEENT: normal Neck: supple. JVD ~8 cm.  Cor: PMI nondisplaced. Regular rate & rhythm. No rubs, gallops or murmurs. Lungs: clear Abdomen: soft, nontender, nondistended. Good bowel sounds. Extremities: no cyanosis, clubbing, rash, trace BLE edema  Neuro: alert & oriented x 3. Moves all 4 extremities w/o difficulty. Affect pleasant.    Telemetry   PAF 60s-70s, intt SB 50s. Intt PVCs (Personally reviewed)    Labs   CBC Recent Labs    10/09/23 0500 10/10/23 0217  WBC 26.6* 26.1*  HGB 9.8* 9.3*  HCT 31.1* 29.0*  MCV 89.9 86.8  PLT 78* 97*   Basic Metabolic Panel Recent Labs    08/65/78 0429 10/08/23 1807 10/09/23 0500 10/10/23 0217  NA 130* 128* 129* 129*  K 5.2* 5.5* 5.6* 4.4  CL 99 95* 96* 94*  CO2 23 23 27 23   GLUCOSE 126* 148* 126* 118*  BUN 18 19 21* 22*  CREATININE  1.02* 0.96 1.01* 0.96  CALCIUM 8.6* 8.7* 8.6* 8.7*  MG 2.5* 2.2  --   --    Liver Function Tests No results for input(s): "AST", "ALT", "ALKPHOS", "BILITOT", "PROT", "ALBUMIN" in the last 72 hours.  No results for input(s): "LIPASE", "AMYLASE" in the last 72 hours. Cardiac Enzymes No results for input(s): "CKTOTAL", "CKMB", "CKMBINDEX", "TROPONINI" in the last 72 hours.  BNP: BNP (last 3 results) Recent Labs    09/25/23 1711  BNP 700.0*   ProBNP (last 3 results) No results for input(s): "PROBNP" in the last 8760 hours.  D-Dimer No results for input(s): "DDIMER" in the last 72 hours. Hemoglobin A1C No results for input(s): "HGBA1C" in the last 72 hours. Fasting Lipid Panel No results for input(s): "CHOL", "HDL", "LDLCALC", "TRIG", "CHOLHDL", "LDLDIRECT" in the last 72 hours. Thyroid Function Tests No results for input(s): "TSH", "T4TOTAL", "T3FREE", "THYROIDAB" in the last 72 hours.  Invalid input(s): "FREET3"  Other results:  Imaging   No results found.  Medications:   Scheduled Medications:  acetaminophen  1,000 mg Oral Q6H   Or   acetaminophen (TYLENOL) oral liquid 160 mg/5 mL  1,000 mg Per Tube Q6H   bisacodyl  10 mg Oral Daily   Or   bisacodyl  10 mg Rectal Daily   busPIRone  5 mg  Oral TID   [facility], Patient & Family Education   Does not apply Once   docusate sodium  200 mg Oral Daily   DULoxetine  40 mg Oral Daily   feeding supplement  237 mL Oral BID BM   furosemide  60 mg Intravenous BID   insulin aspart  0-24 Units Subcutaneous Q6H   isosorbide-hydrALAZINE  0.5 tablet Oral TID   loratadine  10 mg Oral Daily   pantoprazole  40 mg Oral Daily   rosuvastatin  40 mg Oral Daily   sodium chloride flush  3 mL Intravenous Q12H   warfarin  2.5 mg Oral ONCE-1600   Warfarin - Physician Dosing Inpatient   Does not apply q1600    Infusions:  sodium chloride     amiodarone 60 mg/hr (10/10/23 1118)   Followed by   amiodarone      cefTRIAXone (ROCEPHIN)  IV 2 g (10/10/23 0820)    PRN Medications: sodium chloride, cyclobenzaprine, guaiFENesin, ondansetron (ZOFRAN) IV, oxyCODONE, sodium chloride flush, traMADol Assessment/Plan  1. Mitral regurgitation: TEE 3/12 with severe MR, rheumatic-appearing valve.  No mitral stenosis.  - S/p MVR, pulmonary vein isolation MAZE and LAA occlusion 10/07/23. POD # 3 - Stable off Neo - chest tubes and pacing wires now out per TCTS (removed 3/19) - Back on warfarin  2. Atrial fibrillation: Paroxysmal. Back into a fib overnight. NSR this morning.  - Amiodarone restarted and EP consulted.  - S/p pulmonary vein isolation MAZE 10/07/23  3. 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    - Volume difficult to see. Weight now down trending. Closer to pre-op weights. Diuresed well with IV lasix yesterday (occurrences charted). Will repeat IV lasix 60 BID today. May be ready for PO tomorrow.  - Stopped Entresto pre-surgery.  Continue bidil 1/2 tab TID  - Holding spiro with recent hyperkalemia - Can consider adding SGLT2i - Holding BB with underlying sinus arrest with bigeminy  4. Intubated - Extubated post-op  5. Hyperkalemia - 4.4 today - s/p Lokelma yesterday  Continue to mobilize. Stable for floor transfer. [facility] to follow.   Length of Stay: 7  Alen Bleacher, NP  10/10/2023, 11:19 AM  Advanced Heart Failure Team Pager [phone] (M-F; 7a - 5p)  Please contact [facility] for night-coverage after hours (5p -7a ) and weekends on [email or web address]  Patient seen and examined with the above-signed Advanced Practice Provider and/or Housestaff. I personally reviewed laboratory data, imaging studies and relevant notes. I independently examined the patient and formulated the important aspects of the plan. I have edited the note to reflect any of my changes or salient points. I have personally discussed the plan with the patient and/or  family.  Feeling better today. Had AF overnight and now on IV amio. Currently sinus brady.   Diuresed modestly. Volume still up a bit. Chest sore. Denies SOB. Ambulating some  Warfarin started. INR 1.4  No bleeding  General:  Sitting up in bed No resp difficulty HEENT: normal Neck: supple.JVP 8-10 Carotids 2+ bilat; no bruits. No lymphadenopathy or thryomegaly appreciated. Cor: Sternal wound ok  Regular rate & rhythm. 2/6 TR mech s1  Lungs: clear Abdomen: soft, nontender, nondistended. No hepatosplenomegaly. No bruits or masses. Good bowel sounds. Extremities: no cyanosis, clubbing, rash, tr edema Neuro: alert & orientedx3, cranial nerves grossly intact. moves all 4 extremities w/o difficulty. Affect pleasant  Back in NSR on IV amio. Will continue.  Loading warfarin per TCTS.   Volume still up. Continue IV lasix.   Ambulate. Can go to floor. Encourage IS  Arvilla Meres, MD  12:28 PM

## 2023-10-10 NOTE — Progress Notes (Signed)
 TCTS DAILY ICU PROGRESS NOTE                   [address]            [place] [address]          [phone]   3 Days Post-Op Procedure(s) (LRB): MITRAL VALVE REPLACEMENT USING SJM MASTERS SERIES MECHANICAL MITRAL HEART VALVE SIZE (N/A) MAZE PROCEDURE (N/A) ECHOCARDIOGRAM, TRANSESOPHAGEAL, INTRAOPERATIVE (N/A) CLIPPING, LEFT ATRIAL APPENDAGE USING MEDTRONIC PENDITURE LAA EXCLUSION SYSTEM SIZE  Total Length of Stay:  LOS: 7 days   Subjective: Looks and feels better, more energetic, some cou  Objective: Vital signs in last 24 hours: Temp:  [97.1 F (36.2 C)-98.4 F (36.9 C)] 98.4 F (36.9 C) (03/20 0400) Pulse Rate:  [46-93] 77 (03/20 0700) Cardiac Rhythm: Atrial fibrillation (03/19 2000) Resp:  [12-28] 20 (03/20 0700) BP: (97-147)/(59-128) 130/119 (03/20 0700) SpO2:  [81 %-100 %] 81 % (03/20 0700) Weight:  [93.5 kg] 93.5 kg (03/20 0500)  Filed Weights   10/08/23 0429 10/09/23 0500 10/10/23 0500  Weight: 96.7 kg 95.5 kg 93.5 kg    Weight change: -1.982 kg   Hemodynamic parameters for last 24 hours: CVP:  [17 mmHg-32 mmHg] 32 mmHg  Intake/Output from previous day: 03/19 0701 - 03/20 0700 In: 480 [P.O.:480] Out: 800 [Urine:800]  Intake/Output this shift: No intake/output data recorded.  Current Meds: Scheduled Meds:  acetaminophen  1,000 mg Oral Q6H   Or   acetaminophen (TYLENOL) oral liquid 160 mg/5 mL  1,000 mg Per Tube Q6H   bisacodyl  10 mg Oral Daily   Or   bisacodyl  10 mg Rectal Daily   busPIRone  5 mg Oral TID   Chlorhexidine Gluconate Cloth  6 each Topical Daily   docusate sodium  200 mg Oral Daily   DULoxetine  40 mg Oral Daily   feeding supplement  237 mL Oral BID BM   insulin aspart  0-24 Units Subcutaneous Q4H   isosorbide-hydrALAZINE  0.5 tablet Oral TID   loratadine  10 mg Oral Daily   pantoprazole  40 mg Oral Daily   rosuvastatin  40 mg Oral Daily   Warfarin - Physician Dosing Inpatient   Does not apply q1600    Continuous Infusions:  albumin human 999 mL/hr at 10/07/23 1400   PRN Meds:.albumin human, cyclobenzaprine, guaiFENesin, metoprolol tartrate, morphine injection, ondansetron (ZOFRAN) IV, oxyCODONE, traMADol  General appearance: alert, cooperative, and no distress Heart: irregularly irregular rhythm Lungs: some basilar crackles Abdomen: benign Extremities: no edema Wound: incis healing well  Lab Results: CBC: Recent Labs    10/09/23 0500 10/10/23 0217  WBC 26.6* 26.1*  HGB 9.8* 9.3*  HCT 31.1* 29.0*  PLT 78* 97*   BMET:  Recent Labs    10/09/23 0500 10/10/23 0217  NA 129* 129*  K 5.6* 4.4  CL 96* 94*  CO2 27 23  GLUCOSE 126* 118*  BUN 21* 22*  CREATININE 1.01* 0.96  CALCIUM 8.6* 8.7*    CMET: Lab Results  Component Value Date   WBC 26.1 (H) 10/10/2023   HGB 9.3 (L) 10/10/2023   HCT 29.0 (L) 10/10/2023   PLT 97 (L) 10/10/2023   GLUCOSE 118 (H) 10/10/2023   CHOL 175 09/26/2023   TRIG 79 09/26/2023   HDL 41 09/26/2023   LDLCALC 118 (H) 09/26/2023   ALT 81 (H) 09/30/2023   AST 29 09/30/2023   NA 129 (L) 10/10/2023   K 4.4 10/10/2023  CL 94 (L) 10/10/2023   CREATININE 0.96 10/10/2023   BUN 22 (H) 10/10/2023   CO2 23 10/10/2023   TSH 1.091 09/25/2023   INR 1.4 (H) 10/10/2023   HGBA1C 5.8 (H) 07/19/2023      PT/INR:  Recent Labs    10/10/23 0217  LABPROT 17.6*  INR 1.4*   Radiology: No results found.   Assessment/Plan: S/P Procedure(s) (LRB): MITRAL VALVE REPLACEMENT USING SJM MASTERS SERIES MECHANICAL MITRAL HEART VALVE SIZE (N/A) MAZE PROCEDURE (N/A) ECHOCARDIOGRAM, TRANSESOPHAGEAL, INTRAOPERATIVE (N/A) CLIPPING, LEFT ATRIAL APPENDAGE USING MEDTRONIC PENDITURE LAA EXCLUSION SYSTEM SIZE  POD#3  1 afeb, s BP 97-140's, afib w/CVR, HR 30's-100's, start amio 200 bid per MD and get EP to see 2 sats good on 2 liters- cough w/ some sputum- may need to consider abx for bronchitis 3 voiding- not all measured, wt down 2 kg from  yesterday, + 5 kg from preop if accurate, AHF managing diuretic RX 4 INR 1.4- cont low dose coumadin- will give 2.5 5 thrombocytopenia improving trend, 97 K, not on lovenox 6 leukocytosis , WBC stable at 26 K- invasive lines, etc- out 7 H/H slightly lower, 9.3/29 8 K+ normal at 4.4 9 mod hyponatremia- stable w/sodium 129, care with diuretics, could consider fluid restriction if worsens 10 BUN slightly elev at 22, creat is normal 11 BS ok, usual protocols- borderline diabetic 12 tx to 4e   Rowe Clack PA-C Pager [phone] 10/10/2023 7:05 AM

## 2023-10-11 ENCOUNTER — Other Ambulatory Visit: Payer: Self-pay | Admitting: Family Medicine

## 2023-10-11 ENCOUNTER — Inpatient Hospital Stay (HOSPITAL_COMMUNITY)

## 2023-10-11 LAB — CBC
HCT: 28.4 % — ABNORMAL LOW (ref 36.0–46.0)
Hemoglobin: 9 g/dL — ABNORMAL LOW (ref 12.0–15.0)
MCH: 27.6 pg (ref 26.0–34.0)
MCHC: 31.7 g/dL (ref 30.0–36.0)
MCV: 87.1 fL (ref 80.0–100.0)
Platelets: 168 10*3/uL (ref 150–400)
RBC: 3.26 MIL/uL — ABNORMAL LOW (ref 3.87–5.11)
RDW: 15.6 % — ABNORMAL HIGH (ref 11.5–15.5)
WBC: 20.4 10*3/uL — ABNORMAL HIGH (ref 4.0–10.5)
nRBC: 0.1 % (ref 0.0–0.2)

## 2023-10-11 LAB — GLUCOSE, CAPILLARY
Glucose-Capillary: 149 mg/dL — ABNORMAL HIGH (ref 70–99)
Glucose-Capillary: 127 mg/dL — ABNORMAL HIGH (ref 70–99)
Glucose-Capillary: 130 mg/dL — ABNORMAL HIGH (ref 70–99)
Glucose-Capillary: 115 mg/dL — ABNORMAL HIGH (ref 70–99)

## 2023-10-11 LAB — BASIC METABOLIC PANEL
Anion gap: 16 — ABNORMAL HIGH (ref 5–15)
BUN: 22 mg/dL — ABNORMAL HIGH (ref 6–20)
CO2: 22 mmol/L (ref 22–32)
Calcium: 8.5 mg/dL — ABNORMAL LOW (ref 8.9–10.3)
Chloride: 92 mmol/L — ABNORMAL LOW (ref 98–111)
Creatinine, Ser: 1 mg/dL (ref 0.44–1.00)
GFR, Estimated: >60 mL/min (ref >60)
Glucose, Bld: 102 mg/dL — ABNORMAL HIGH (ref 70–99)
Potassium: 3.8 mmol/L (ref 3.5–5.1)
Sodium: 130 mmol/L — ABNORMAL LOW (ref 135–145)

## 2023-10-11 LAB — PROTIME-INR
INR: 1.6 — ABNORMAL HIGH (ref 0.8–1.2)
Prothrombin Time: 19.6 s — ABNORMAL HIGH (ref 11.4–15.2)

## 2023-10-11 MED ORDER — FUROSEMIDE 10 MG/ML IJ SOLN
60.0000 mg | Freq: Once | INTRAMUSCULAR | Status: AC
  Administered 2023-10-11: 60 mg via INTRAVENOUS
  Filled 2023-10-11: qty 6

## 2023-10-11 MED ORDER — AMIODARONE HCL 200 MG PO TABS
400.0000 mg | ORAL_TABLET | Freq: Two times a day (BID) | ORAL | Status: DC
  Administered 2023-10-11 – 2023-10-14 (×7): 400 mg via ORAL
  Filled 2023-10-11 (×7): qty 2

## 2023-10-11 MED ORDER — WARFARIN SODIUM 2 MG PO TABS
4.0000 mg | ORAL_TABLET | Freq: Once | ORAL | Status: AC
  Administered 2023-10-11: 4 mg via ORAL
  Filled 2023-10-11: qty 2

## 2023-10-11 MED ORDER — POTASSIUM CHLORIDE CRYS ER 20 MEQ PO TBCR
40.0000 meq | EXTENDED_RELEASE_TABLET | Freq: Once | ORAL | Status: AC
  Administered 2023-10-11: 40 meq via ORAL
  Filled 2023-10-11: qty 2

## 2023-10-11 NOTE — Progress Notes (Addendum)
 4 Days Post-Op Procedure(s) (LRB): MITRAL VALVE REPLACEMENT USING SJM MASTERS SERIES MECHANICAL MITRAL HEART VALVE SIZE (N/A) MAZE PROCEDURE (N/A) ECHOCARDIOGRAM, TRANSESOPHAGEAL, INTRAOPERATIVE (N/A) CLIPPING, LEFT ATRIAL APPENDAGE USING MEDTRONIC PENDITURE LAA EXCLUSION SYSTEM SIZE Subjective: Feels better, cough improved  Objective: Vital signs in last 24 hours: Temp:  [97.7 F (36.5 C)-98.6 F (37 C)] 97.7 F (36.5 C) (03/21 0357) Pulse Rate:  [43-90] 43 (03/21 0500) Cardiac Rhythm: Atrial fibrillation;Sinus bradycardia;Other (Comment) (03/20 2100) Resp:  [15-23] 22 (03/21 0500) BP: (105-134)/(68-121) 117/80 (03/21 0357) SpO2:  [92 %-96 %] 95 % (03/21 0500) Weight:  [97 kg] 97 kg (03/21 0500)  Hemodynamic parameters for last 24 hours:    Intake/Output from previous day: 03/20 0701 - 03/21 0700 In: 487.5 [P.O.:240; I.V.:147.5; IV Piggyback:100] Out: 400 [Urine:400] Intake/Output this shift: No intake/output data recorded.  General appearance: alert, cooperative, and no distress Heart: irregularly irregular rhythm Lungs: mildly dim in bases, clear Abdomen: benign Extremities: no edema Wound: incis healing well  Lab Results: Recent Labs    10/10/23 0217 10/11/23 0349  WBC 26.1* 20.4*  HGB 9.3* 9.0*  HCT 29.0* 28.4*  PLT 97* 168   BMET:  Recent Labs    10/10/23 0217 10/11/23 0349  NA 129* 130*  K 4.4 3.8  CL 94* 92*  CO2 23 22  GLUCOSE 118* 102*  BUN 22* 22*  CREATININE 0.96 1.00  CALCIUM 8.7* 8.5*    PT/INR:  Recent Labs    10/11/23 0349  LABPROT 19.6*  INR 1.6*   ABG    Component Value Date/Time   PHART 7.327 (L) 10/07/2023 1704   HCO3 22.5 10/07/2023 1704   TCO2 24 10/07/2023 1704   ACIDBASEDEF 4.0 (H) 10/07/2023 1704   O2SAT 97 10/07/2023 1704   CBG (last 3)  Recent Labs    10/10/23 1656 10/10/23 2330 10/11/23 0614  GLUCAP 275* 104* 149*    Meds Scheduled Meds:  acetaminophen  1,000 mg Oral Q6H   Or    acetaminophen (TYLENOL) oral liquid 160 mg/5 mL  1,000 mg Per Tube Q6H   bisacodyl  10 mg Oral Daily   Or   bisacodyl  10 mg Rectal Daily   busPIRone  5 mg Oral TID   docusate sodium  200 mg Oral Daily   DULoxetine  40 mg Oral Daily   feeding supplement  237 mL Oral BID BM   insulin aspart  0-24 Units Subcutaneous Q6H   isosorbide-hydrALAZINE  0.5 tablet Oral TID   loratadine  10 mg Oral Daily   pantoprazole  40 mg Oral Daily   rosuvastatin  40 mg Oral Daily   sodium chloride flush  3 mL Intravenous Q12H   Warfarin - Physician Dosing Inpatient   Does not apply q1600   Continuous Infusions:  sodium chloride     amiodarone 30 mg/hr (10/10/23 2037)   cefTRIAXone (ROCEPHIN)  IV Stopped (10/10/23 0850)   PRN Meds:.sodium chloride, cyclobenzaprine, guaiFENesin, ondansetron (ZOFRAN) IV, oxyCODONE, sodium chloride flush, traMADol  Xrays No results found.  Assessment/Plan: S/P Procedure(s) (LRB): MITRAL VALVE REPLACEMENT USING SJM MASTERS SERIES MECHANICAL MITRAL HEART VALVE SIZE (N/A) MAZE PROCEDURE (N/A) ECHOCARDIOGRAM, TRANSESOPHAGEAL, INTRAOPERATIVE (N/A) CLIPPING, LEFT ATRIAL APPENDAGE USING MEDTRONIC PENDITURE LAA EXCLUSION SYSTEM SIZE POD#4  1 afeb, s BP 105-134  variable rhythm-   brady,? HB,  afib, BBB, PVC's- on amio gtt- EP assisting w/management- trying to avoid pacer if possible 2 sats ok on 2 liters 3 weight uo 3 kg from  yesterday- doubt accuracy as has been diuresing- amt unknown as not all voids measured- AHF has been dosing diuretics 4 BS - conts SSI , one reading > 200- mostly controlled- will need close outpatient f/u for diabetes- no meds preop, last Hg A1C 5.8 in 12/24 5 sodium slightly improved to 130 6 renal fxn remains normal 7 WBC trending lower, on IV ceftriaxone for suspected Bronchitis, WBC 20.4 8 H/H fairly stable with slight lower trending, not at transfusion threshold, likely still equilibrating 8 thrombocytopenia resolved 9 INR 1.6 - will  give 4 mg coumadin today for mechanical valve 10 cont pulm hygiene and routine rehab modalities- CXR - atx, some ASDin right lower fields- pretty stable    LOS: 8 days    Rowe Clack PA-C Pager [phone] 10/11/2023

## 2023-10-11 NOTE — Plan of Care (Signed)

## 2023-10-11 NOTE — Addendum Note (Signed)
 Addended by: Bing Neighbors on: 10/11/2023 05:37 PM   Modules accepted: Orders

## 2023-10-11 NOTE — Progress Notes (Addendum)
 Patient Name: ANUM PALECEK Date of Encounter: 10/11/2023 [facility] Cardiologist: Julien Nordmann, MD   Interval Summary  .    Says her breathing is better today compared to yesterday. Some chest soreness still. Didn't feel like walking with PT this morning.   Vital Signs .    Vitals:   10/11/23 0500 10/11/23 0821 10/11/23 0856 10/11/23 0858  BP:  112/82 97/78 113/73  Pulse: (!) 43     Resp: (!) 22 18 19 18   Temp:  97.6 F (36.4 C)    TempSrc:  Oral    SpO2: 95%     Weight: 97 kg     Height:        Intake/Output Summary (Last 24 hours) at 10/11/2023 1036 Last data filed at 10/10/2023 1800 Gross per 24 hour  Intake 219.79 ml  Output 400 ml  Net -180.21 ml      10/11/2023    5:00 AM 10/10/2023    5:00 AM 10/09/2023    5:00 AM  Last 3 Weights  Weight (lbs) 213 lb 13.5 oz 206 lb 2.1 oz 210 lb 8 oz  Weight (kg) 97 kg 93.5 kg 95.482 kg      Telemetry/ECG    Episodes of paroxysmal atrial fibrillation, underlying sinus bradycardia/sinus rhythm - Personally Reviewed  Physical Exam .    GEN: No acute distress.   Neck: No JVD Cardiac: RRR, soft systolic murmur, mech valve click, rubs, or gallops.  Respiratory: Diminished in bases GI: Soft, nontender, non-distended  MS: No edema  Assessment & Plan .     Mitral regurgitation  -- TEE 3/12 with severe MR, rheumatic-appearing valve.  No mitral stenosis.  -- S/p mech MVR, pulmonary vein isolation MAZE and LAA occlusion 10/07/23 -- Stable off Neo, chest tubes and pacing wires now out per TCTS (removed 3/19) -- on coumadin   Paroxsymal Atrial fibrillation Sinus Bradycardia LBBB -- S/p pulmonary vein isolation MAZE 10/07/23 -- EP following -- amiodarone being transitioned to PO dosing   Acute on chronic HF mid range EF -- Echo 3/25 with EF 45-50%, severe MR, mild RV dysfunction.    -- Weights are variable, has been diuresing with IV lasix, CXR today with mild interval progression of left lung  atelectasis/pleural effusion -- received IV lasix 60mg  this morning, follow up UOP (has not been accurate on voids) -- Stopped Entresto pre-surgery.  Continue bidil 1/2 tab TID  -- spiro held with recent hyperkalemia -- can consider SLGT2 prior to discharge    Hyperkalemia -- K+ 3.8 today  Leukocytosis -- WBC 15.9>>24.4>>26.6>>20.4 -- on rocephin per primary  For questions or updates, please contact [facility] Please consult [email or web address] for contact info under        Signed, Laverda Page, NP    ATTENDING ATTESTATION:  After conducting a review of all available clinical information with the care team, interviewing the patient, and performing a physical exam, I agree with the findings and plan described in this note.   GEN: No acute distress.   HEENT:  MMM, no JVD, no scleral icterus Cardiac: irregular RR, no murmurs, rubs, or gallops.  + mechanical S1, well healed incision Respiratory: Decreased BS at bases GI: Soft, nontender, non-distended  MS: No edema; No deformity. Neuro:  Nonfocal  Vasc:  +2 radial pulses  Patient doing well after MVR, MAZE, and LAA ligation.  Biggest issues is sternal discomfort.  Agree with optimizing regimen prior to d/c.  Will hold on SGLT2i given need  for periwick.  BP well controlled now, would eventually start ARB or Entresto depending on BP over the next few days.  Alverda Skeans, MD Pager [phone]

## 2023-10-11 NOTE — Progress Notes (Signed)
 CARDIAC REHAB PHASE I   PRE:  Rate/Rhythm: 68 SR /bbb  BP:  Sitting: 113/73      SaO2: 96 2L/[place]  MODE:  Ambulation: 150 ft   POST:  Rate/Rhythm: 77 SR /bbb  BP:  Sitting: 141/81      SaO2: 96 2L/[place]   Pt ambulated in hallway using front wheel walker. Moving at slow steady pace, taking several standing rest breaks. Tolerated well with no dizziness, mild incisional pain and mild SOB off and on. Returned to chair with call bell and bedside table in reach.  Post OHS education including site care, restrictions, heart healthy diet, sternal precautions, IS use at home, home needs at discharge, exercise guidelines, smoking cessation and CRP2 reviewed. All questions and concerns addressed. Will refer to [facility] for CRP2. DME needed ordered via case management. O2 saturations decreased to 85% at rest. Mobility team will conduct O2 walk test later today. Encouraged mobility and frequent IS use. Will continue to follow.   [id number] Woodroe Chen, RN BSN 10/11/2023 12:29 PM

## 2023-10-11 NOTE — Progress Notes (Signed)
 EKG ordered

## 2023-10-11 NOTE — Progress Notes (Signed)
 Mobility Specialist Progress Note:    10/11/23 1419  Mobility  Activity Ambulated with assistance in hallway;Ambulated with assistance in room  Level of Assistance Standby assist, set-up cues, supervision of patient - no hands on  Assistive Device Front wheel walker  Distance Ambulated (ft) 150 ft  RUE Weight Bearing Per Provider Order NWB  LUE Weight Bearing Per Provider Order NWB  Activity Response Tolerated well  Mobility Referral Yes  Mobility visit 1 Mobility  Mobility Specialist Start Time (ACUTE ONLY) 1410  Mobility Specialist Stop Time (ACUTE ONLY) 1419  Mobility Specialist Time Calculation (min) (ACUTE ONLY) 9 min   Pt received in bed, agreeable to mobility session. Ambulated in hallway with RW and SBA. Monitored SpO2 throughout. See ambulatory O2 sat note. Took 3 standing rest breaks to recover. Returned pt to room, left with all needs met.   Feliciana Rossetti Mobility Specialist Please contact via Special educational needs teacher or  Rehab office at [phone]

## 2023-10-11 NOTE — Progress Notes (Signed)
 Nurse requested Mobility Specialist to perform oxygen saturation test with pt which includes removing pt from oxygen both at rest and while ambulating.  Below are the results from that testing.     Patient Saturations on Room Air at Rest = spO2 96%  Patient Saturations on Room Air while Ambulating = sp02 81% .  Rested and performed pursed lip breathing for 1 minute with sp02 at 66%.  Patient Saturations on 3 Liters of oxygen while Ambulating = sp02 91%  At end of testing pt left in room on 2  Liters of oxygen.  Reported results to nurse.    Feliciana Rossetti Mobility Specialist Please contact via Special educational needs teacher or  Rehab office at [phone]

## 2023-10-11 NOTE — Progress Notes (Signed)
  Patient Name: Sandra Hines Date of Encounter: 10/11/2023  Primary Cardiologist: Julien Nordmann, MD Electrophysiologist: None  Interval Summary   Feeling OK this am. No new complaints.   Vital Signs    Vitals:   10/10/23 2330 10/10/23 2343 10/11/23 0357 10/11/23 0500  BP: (!) 134/121 105/73 117/80   Pulse: 90 (!) 47 60 (!) 43  Resp: 18 20 20  (!) 22  Temp: 98.3 F (36.8 C)  97.7 F (36.5 C)   TempSrc: Oral  Oral   SpO2: 96% 92% 95% 95%  Weight:    97 kg  Height:        Intake/Output Summary (Last 24 hours) at 10/11/2023 0725 Last data filed at 10/10/2023 1800 Gross per 24 hour  Intake 487.54 ml  Output 400 ml  Net 87.54 ml   Filed Weights   10/09/23 0500 10/10/23 0500 10/11/23 0500  Weight: 95.5 kg 93.5 kg 97 kg    Physical Exam    GEN- The patient is well appearing, alert and oriented x 3 today.   Lungs- Clear to ausculation bilaterally, normal work of breathing Cardiac- Regular rate and rhythm, no murmurs, rubs or gallops GI- soft, NT, ND, + BS Extremities- no clubbing or cyanosis. No edema  Telemetry    Paroxysmal AF overnight, SB/NSR 60s this am (personally reviewed)  Hospital Course    Janisa B Bordwell is a 53 y.o. female with a history of HFmrEF, severe MR, HTN,HTN, cerebral anuerysm s/p repair and tobacco abuse who is being seen today for the evaluation of post op AF and bradycardia at the request of Dr. Leafy Ro.   Assessment & Plan    Paroxysmal AF Underwent MAZE and LAA occlusion at time of MVR Suspect will improve as she gets further out from surgery. Finish current amiodarone bag, then start 400 mg BID oral.  No significant sustained bradycardia.    Sinus bradycardia Left bundle branch block Continue to follow rates and rhythm for potential pacer. No urgent indication.  QRS ~150 ms If needs pacing, will need to discuss optimal time frame. Not currently on heparin, coumadin with INR trending up. 1.4 -> 1.6    Severe MR s/p Mechanical MVR  10/07/23 With MAZE and LAA clipping as well Per primary.    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  HF team following as well.   Dr. Nelly Laurence has seen.   For questions or updates, please contact [facility] Please consult [email or web address] for contact info under Cardiology/STEMI.  Signed, Graciella Freer, PA-C  10/11/2023, 7:25 AM

## 2023-10-11 NOTE — Progress Notes (Signed)
 Mobility Specialist Progress Note:   10/11/23 1016  Mobility  Activity Refused mobility  Mobility Referral Yes   Pt politely declined mobility. Pt reports feeling fatigued and having chest pains. Left with all needs met. Will f/u if time permits.   Feliciana Rossetti Mobility Specialist Please contact via Special educational needs teacher or  Rehab office at [phone]

## 2023-10-12 DIAGNOSIS — I5021 Acute systolic (congestive) heart failure: Secondary | ICD-10-CM

## 2023-10-12 LAB — GLUCOSE, CAPILLARY
Glucose-Capillary: 92 mg/dL (ref 70–99)
Glucose-Capillary: 178 mg/dL — ABNORMAL HIGH (ref 70–99)
Glucose-Capillary: 122 mg/dL — ABNORMAL HIGH (ref 70–99)
Glucose-Capillary: 147 mg/dL — ABNORMAL HIGH (ref 70–99)
Glucose-Capillary: 115 mg/dL — ABNORMAL HIGH (ref 70–99)

## 2023-10-12 LAB — CBC
HCT: 27.7 % — ABNORMAL LOW (ref 36.0–46.0)
Hemoglobin: 9 g/dL — ABNORMAL LOW (ref 12.0–15.0)
MCH: 28.1 pg (ref 26.0–34.0)
MCHC: 32.5 g/dL (ref 30.0–36.0)
MCV: 86.6 fL (ref 80.0–100.0)
Platelets: 242 10*3/uL (ref 150–400)
RBC: 3.2 MIL/uL — ABNORMAL LOW (ref 3.87–5.11)
RDW: 15.4 % (ref 11.5–15.5)
WBC: 17.4 10*3/uL — ABNORMAL HIGH (ref 4.0–10.5)
nRBC: 0.9 % — ABNORMAL HIGH (ref 0.0–0.2)

## 2023-10-12 LAB — BASIC METABOLIC PANEL
Anion gap: 10 (ref 5–15)
BUN: 18 mg/dL (ref 6–20)
CO2: 27 mmol/L (ref 22–32)
Calcium: 8.2 mg/dL — ABNORMAL LOW (ref 8.9–10.3)
Chloride: 92 mmol/L — ABNORMAL LOW (ref 98–111)
Creatinine, Ser: 0.79 mg/dL (ref 0.44–1.00)
GFR, Estimated: >60 mL/min (ref >60)
Glucose, Bld: 94 mg/dL (ref 70–99)
Potassium: 4 mmol/L (ref 3.5–5.1)
Sodium: 129 mmol/L — ABNORMAL LOW (ref 135–145)

## 2023-10-12 LAB — PROTIME-INR
INR: 1.9 — ABNORMAL HIGH (ref 0.8–1.2)
Prothrombin Time: 21.7 s — ABNORMAL HIGH (ref 11.4–15.2)

## 2023-10-12 MED ORDER — WARFARIN SODIUM 5 MG PO TABS
5.0000 mg | ORAL_TABLET | Freq: Once | ORAL | Status: AC
  Administered 2023-10-12: 5 mg via ORAL
  Filled 2023-10-12: qty 1

## 2023-10-12 MED ORDER — FE FUM-VIT C-VIT B12-FA 460-60-0.01-1 MG PO CAPS
1.0000 | ORAL_CAPSULE | Freq: Every day | ORAL | Status: DC
  Administered 2023-10-12 – 2023-10-14 (×3): 1 via ORAL
  Filled 2023-10-12 (×3): qty 1

## 2023-10-12 NOTE — Progress Notes (Signed)
 5 Days Post-Op Procedure(s) (LRB): MITRAL VALVE REPLACEMENT USING SJM MASTERS SERIES MECHANICAL MITRAL HEART VALVE SIZE (N/A) MAZE PROCEDURE (N/A) ECHOCARDIOGRAM, TRANSESOPHAGEAL, INTRAOPERATIVE (N/A) CLIPPING, LEFT ATRIAL APPENDAGE USING MEDTRONIC PENDITURE LAA EXCLUSION SYSTEM SIZE Subjective: Conts to feel better each day, some desats w/ walking, some pain especially w/ cough, but cough conts to improve  Objective: Vital signs in last 24 hours: Temp:  [97.6 F (36.4 C)-98.6 F (37 C)] 97.6 F (36.4 C) (03/22 0345) Pulse Rate:  [40-73] 40 (03/22 1610) Cardiac Rhythm: Sinus bradycardia;Bundle branch block;Heart block (03/22 0426) Resp:  [17-24] 24 (03/22 0614) BP: (97-141)/(73-89) 130/89 (03/22 0345) SpO2:  [82 %-100 %] 82 % (03/22 0614) Weight:  [93 kg] 93 kg (03/22 0614)  Hemodynamic parameters for last 24 hours:    Intake/Output from previous day: 03/21 0701 - 03/22 0700 In: 1070 [P.O.:1070] Out: 900 [Urine:900] Intake/Output this shift: No intake/output data recorded.  General appearance: alert, cooperative, and no distress Heart: regular rate and rhythm and crisp valve click Lungs: mildly dim in bases Abdomen: benign Extremities: no edema Wound: incis healing well  Lab Results: Recent Labs    10/11/23 0349 10/12/23 0313  WBC 20.4* 17.4*  HGB 9.0* 9.0*  HCT 28.4* 27.7*  PLT 168 242   BMET:  Recent Labs    10/11/23 0349 10/12/23 0313  NA 130* 129*  K 3.8 4.0  CL 92* 92*  CO2 22 27  GLUCOSE 102* 94  BUN 22* 18  CREATININE 1.00 0.79  CALCIUM 8.5* 8.2*    PT/INR:  Recent Labs    10/12/23 0313  LABPROT 21.7*  INR 1.9*   ABG    Component Value Date/Time   PHART 7.327 (L) 10/07/2023 1704   HCO3 22.5 10/07/2023 1704   TCO2 24 10/07/2023 1704   ACIDBASEDEF 4.0 (H) 10/07/2023 1704   O2SAT 97 10/07/2023 1704   CBG (last 3)  Recent Labs    10/11/23 1804 10/11/23 2322 10/12/23 0610  GLUCAP 130* 115* 92    Meds Scheduled Meds:   acetaminophen  1,000 mg Oral Q6H   Or   acetaminophen (TYLENOL) oral liquid 160 mg/5 mL  1,000 mg Per Tube Q6H   amiodarone  400 mg Oral BID   bisacodyl  10 mg Oral Daily   Or   bisacodyl  10 mg Rectal Daily   busPIRone  5 mg Oral TID   docusate sodium  200 mg Oral Daily   DULoxetine  40 mg Oral Daily   feeding supplement  237 mL Oral BID BM   insulin aspart  0-24 Units Subcutaneous Q6H   isosorbide-hydrALAZINE  0.5 tablet Oral TID   loratadine  10 mg Oral Daily   pantoprazole  40 mg Oral Daily   rosuvastatin  40 mg Oral Daily   sodium chloride flush  3 mL Intravenous Q12H   Warfarin - Physician Dosing Inpatient   Does not apply q1600   Continuous Infusions:  cefTRIAXone (ROCEPHIN)  IV Stopped (10/11/23 1000)   PRN Meds:.guaiFENesin, ondansetron (ZOFRAN) IV, oxyCODONE, sodium chloride flush, traMADol  Xrays DG Chest 2 View Result Date: 10/11/2023 CLINICAL DATA:  [id number] S/P MVR (mitral valve replacement) [id number]. EXAM: CHEST - 2 VIEW COMPARISON:  10/09/2023. FINDINGS: Low lung volume. Re-demonstration of left retrocardiac airspace opacity obscuring the left hemidiaphragm, descending thoracic aorta and blunting the left lateral costophrenic angle, suggesting combination of left lung atelectasis and/or consolidation with pleural effusion. There is probable mild interval progression since the prior study. Right lung and  right lateral costophrenic angle are grossly clear. No pneumothorax on either side. Stable cardio-mediastinal silhouette. Stable widening of the superior mediastinum. Left atrial appendage closure device and prosthetic mitral valve noted. There are sternotomy wires. No acute osseous abnormalities. The soft tissues are within normal limits. IMPRESSION: Mild interval progression of left retrocardiac opacity, as described above. Electronically Signed   By: Jules Schick M.D.   On: 10/11/2023 07:58    Assessment/Plan: S/P Procedure(s) (LRB): MITRAL VALVE REPLACEMENT USING SJM  MASTERS SERIES MECHANICAL MITRAL HEART VALVE SIZE (N/A) MAZE PROCEDURE (N/A) ECHOCARDIOGRAM, TRANSESOPHAGEAL, INTRAOPERATIVE (N/A) CLIPPING, LEFT ATRIAL APPENDAGE USING MEDTRONIC PENDITURE LAA EXCLUSION SYSTEM SIZE POD#5  1 afeb, HR 40's-110's, s BP 90's-140's, afib, brady for very short period, sinus w/ BBB 2 sats ok on 2 liters 3 voiding well, not measured, weight about 5 kg >preop 4 BS controlled on SSI- eventual start of SGLT2i 5 sodium stable at 129 6 normal renal fxn 7 leukocytosis conts to trend improved- on rocephin for bronchitis 8 hgb stable at 9.0- will add iron 9 INR 1.9- will give 5 of coumadin today 10 rhythm management per EP, AHF managing GDMT implementation/ HFrEF- acute on chronic 11 cont rehab and pulm hygiene    LOS: 9 days    Rowe Clack PA-C Pager [phone] 10/12/2023

## 2023-10-12 NOTE — Progress Notes (Signed)
   Rounding Note    Patient Name: Sandra Hines Date of Encounter: 10/12/2023  [facility] Cardiologist: Julien Nordmann, MD   Subjective   NAEO. Feeling OK this AM.  Vital Signs    Vitals:   10/12/23 0345 10/12/23 0614 10/12/23 0845 10/12/23 0932  BP: 130/89  112/76 107/68  Pulse: 69 (!) 40  (!) 28  Resp: 17 (!) 24  15  Temp: 97.6 F (36.4 C)  97.6 F (36.4 C)   TempSrc: Oral  Oral   SpO2: 99% (!) 82%  90%  Weight:  93 kg    Height:        Intake/Output Summary (Last 24 hours) at 10/12/2023 1017 Last data filed at 10/12/2023 0600 Gross per 24 hour  Intake 830 ml  Output 400 ml  Net 430 ml      10/12/2023    6:14 AM 10/11/2023    5:00 AM 10/10/2023    5:00 AM  Last 3 Weights  Weight (lbs) 205 lb 1.6 oz 213 lb 13.5 oz 206 lb 2.1 oz  Weight (kg) 93.033 kg 97 kg 93.5 kg      Telemetry    [place] of AF. No severe bradycardia episodes. - Personally Reviewed  Physical Exam   GEN: No acute distress.   Cardiac: RRR, no murmurs, rubs, or gallops.  Respiratory: Clear to auscultation bilaterally. Psych: Normal affect   Assessment & Plan    #AF #Intermittent bradycardia Improving. S/P MAZE and LAAO intra-op. No significant bradycardia. No indication for permanent pacing at this time. Continue Amiodarone 400mg  PO BID x 5 days followed by 400mg  PO daily x 5 days followed by 200mg  PO daily thereafter. Will need CMP, TSH and FT4 in 4-6 weeks. Continue coumadin.  #Severe MR Hemodynamically stable. Recovering well post op.  #Acute on chronic systolic HF NYHA II. Recovering post surgery. Intermittent low BP's are preventing addition of GDMT, will need to reassess in outpatient setting. - cont bidil     Sheria Lang T. Lalla Brothers, MD, [facility], [facility] Cardiac Electrophysiology

## 2023-10-12 NOTE — Progress Notes (Signed)
  CARDIAC REHAB PHASE I    PRE:                Rate/Rhythm: NSR with PVCs, runs of VT when getting out of bed, ambulating, resolved with rest   BP:                  Sitting:  112/76                                      SaO2: 91% on 1L    MODE:            Ambulation: 144ft, walked hallway on RA, lowest O2 was 90%           POST:             Rate/Rhythm: NSR with PVCs   BP:                  Sitting:     107/686                           SaO2: 92% RA   Pt tolerated exercise fair and amb 140 ft with gait belt, walker, and stand-by assist. Pt endorses heart palpitations, note frequent PVCs and runs of VT. Resolved at rest. Denied SOB, or dizziness throughout walk. Follow up questions answered from prior education on [facility] site care, restrictions, heart healthy diet, sternal precautions, IS use at home, home needs at discharge, exercise guidelines, smoking cessation and CRP2 reviewed. Pt verbalized understanding. RN spoke to pt's direct care RN and informed of EKG changes.     Service time is from 0910 to 0941  Essie Hart, RN, BSN [facility]

## 2023-10-12 NOTE — Progress Notes (Signed)
 Physical Therapy Brief Evaluation and Discharge Note Patient Details Name: Sandra Hines MRN: [id number] DOB: Oct 12, 1970 Today's Date: 10/12/2023   History of Present Illness  53 y.o. female presents to [facility] as a transfer from [facility] on 10/03/23 to optimize diastolic HF and possible MVR. Pt had conversion at [facility] due to being in a-rib with RVR. 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 3/13-3/20 ICU. PMH: HTN, cerebral aneurysm s/p repair   Clinical Impression  Pt in bed upon arrival and agreeable to PT eval. PTA, pt was independent with no AD. In today's session, pt was able to perform all mobility independently or ModI with a RW. Pt was able to verbalize sternal precautions and demonstrate log roll technique. Pt was able to ambulate ~200 ft total and prefers use of RW for improved stability and activity tolerance. Pt has no further acute PT needs with recommendation for continued cardiac rehab and use of mobility specialists while in the hospital. Acute PT signing off. Please re-consult if new needs arise.  BP 129/92, 104 75 BPM at rest, 83 BPM during gait    PT Assessment Patient does not need any further PT services (Cardiac Rehab)  Assistance Needed at Discharge  Intermittent Supervision/Assistance    Equipment Recommendations Rolling walker (2 wheels)     Precautions/Restrictions Precautions Precautions: Sternal Precaution Booklet Issued: Yes (comment) Recall of Precautions/Restrictions: Intact Restrictions Weight Bearing Restrictions Per Provider Order: Yes Other Position/Activity Restrictions: Sternal        Mobility  Bed Mobility Rolling: Independent Supine/Sidelying to sit: Independent Sit to supine/sidelying: Independent General bed mobility comments: Independent, cues for log roll technique  Transfers Overall transfer level: Modified independent Equipment used: None Transfers: Sit to/from Stand Sit to Stand: Modified independent  (Device/Increase time)      General transfer comment: able to stand with hands on knees to adhere to sternal precautions, no physical assist needed    Ambulation/Gait Ambulation/Gait assistance: Modified independent (Device/Increase time) Gait Distance (Feet): 200 Feet Assistive device: Rolling walker (2 wheels) Gait Pattern/deviations: Step-through pattern, Decreased stride length Gait Speed: Below normal General Gait Details: slow and steady gait, no LOB      Balance Overall balance assessment: Needs assistance Sitting-balance support: No upper extremity supported, Feet supported Sitting balance-Leahy Scale: Normal    Standing balance support: Bilateral upper extremity supported, During functional activity Standing balance-Leahy Scale: Fair Standing balance comment: able to stand and take a few steps with no AD, prefers RW for comfort with gait         Pertinent Vitals/Pain PT - Brief Vital Signs All Vital Signs Stable: Yes Pain Assessment Pain Assessment: Faces Faces Pain Scale: Hurts even more Pain Location: chest incision and HA Pain Descriptors / Indicators: Aching, Discomfort Pain Intervention(s): Limited activity within patient's tolerance, Monitored during session, Repositioned, RN gave pain meds during session     Home Living Family/patient expects to be discharged to:: Private residence Living Arrangements: Children Available Help at Discharge: Family;Available 24 hours/day Home Environment: Other (comment) (1/2 step)   Home Equipment: None       Prior Function Level of Independence: Independent      UE/LE Assessment   UE ROM/Strength/Tone/Coordination: WFL    LE ROM/Strength/Tone/Coordination: [facility]      Communication   Communication Communication: No apparent difficulties     Cognition Overall Cognitive Status: Appears within functional limits for tasks assessed/performed        PT Visit Diagnosis Other abnormalities of gait and  mobility (R26.89)  No Skilled PT Patient is modified independent with all activity/mobility;All education completed    AMPAC 6 Clicks Help needed turning from your back to your side while in a flat bed without using bedrails?: None Help needed moving from lying on your back to sitting on the side of a flat bed without using bedrails?: None Help needed moving to and from a bed to a chair (including a wheelchair)?: None Help needed standing up from a chair using your arms (e.g., wheelchair or bedside chair)?: None Help needed to walk in hospital room?: None Help needed climbing 3-5 steps with a railing? : A Little 6 Click Score: 23      End of Session   Activity Tolerance: Patient tolerated treatment well Patient left: in bed;with call bell/phone within reach Nurse Communication: Mobility status PT Visit Diagnosis: Other abnormalities of gait and mobility (R26.89)     Time: 4098-1191 PT Time Calculation (min) (ACUTE ONLY): 19 min  Charges:   PT Evaluation $PT Eval Low Complexity: 1 Low     Hilton Cork, PT, DPT Secure Chat Preferred  Rehab Office [phone]   Arturo Morton Brion Aliment  10/12/2023, 5:01 PM

## 2023-10-13 LAB — BASIC METABOLIC PANEL
Anion gap: 9 (ref 5–15)
BUN: 14 mg/dL (ref 6–20)
CO2: 29 mmol/L (ref 22–32)
Calcium: 8.4 mg/dL — ABNORMAL LOW (ref 8.9–10.3)
Chloride: 91 mmol/L — ABNORMAL LOW (ref 98–111)
Creatinine, Ser: 0.83 mg/dL (ref 0.44–1.00)
GFR, Estimated: >60 mL/min (ref >60)
Glucose, Bld: 121 mg/dL — ABNORMAL HIGH (ref 70–99)
Potassium: 3.9 mmol/L (ref 3.5–5.1)
Sodium: 129 mmol/L — ABNORMAL LOW (ref 135–145)

## 2023-10-13 LAB — CBC
HCT: 27.9 % — ABNORMAL LOW (ref 36.0–46.0)
Hemoglobin: 9 g/dL — ABNORMAL LOW (ref 12.0–15.0)
MCH: 27.8 pg (ref 26.0–34.0)
MCHC: 32.3 g/dL (ref 30.0–36.0)
MCV: 86.1 fL (ref 80.0–100.0)
Platelets: 320 10*3/uL (ref 150–400)
RBC: 3.24 MIL/uL — ABNORMAL LOW (ref 3.87–5.11)
RDW: 15.4 % (ref 11.5–15.5)
WBC: 16.3 10*3/uL — ABNORMAL HIGH (ref 4.0–10.5)
nRBC: 2 % — ABNORMAL HIGH (ref 0.0–0.2)

## 2023-10-13 LAB — GLUCOSE, CAPILLARY
Glucose-Capillary: 103 mg/dL — ABNORMAL HIGH (ref 70–99)
Glucose-Capillary: 114 mg/dL — ABNORMAL HIGH (ref 70–99)
Glucose-Capillary: 127 mg/dL — ABNORMAL HIGH (ref 70–99)
Glucose-Capillary: 155 mg/dL — ABNORMAL HIGH (ref 70–99)
Glucose-Capillary: 97 mg/dL (ref 70–99)

## 2023-10-13 LAB — PROTIME-INR
INR: 2.3 — ABNORMAL HIGH (ref 0.8–1.2)
Prothrombin Time: 25.5 s — ABNORMAL HIGH (ref 11.4–15.2)

## 2023-10-13 MED ORDER — WARFARIN SODIUM 5 MG PO TABS: 5.0000 mg | ORAL_TABLET | Freq: Once | ORAL | Status: DC

## 2023-10-13 MED ORDER — FUROSEMIDE 40 MG PO TABS
40.0000 mg | ORAL_TABLET | Freq: Every day | ORAL | Status: DC
  Administered 2023-10-13: 40 mg via ORAL
  Filled 2023-10-13: qty 1

## 2023-10-13 MED ORDER — POTASSIUM CHLORIDE CRYS ER 20 MEQ PO TBCR
20.0000 meq | EXTENDED_RELEASE_TABLET | Freq: Every day | ORAL | Status: DC
Start: 2023-10-13 — End: 2023-10-14
  Administered 2023-10-13 – 2023-10-14 (×2): 20 meq via ORAL
  Filled 2023-10-13 (×2): qty 1

## 2023-10-13 MED ORDER — INSULIN ASPART 100 UNIT/ML IJ SOLN
0.0000 [IU] | Freq: Three times a day (TID) | INTRAMUSCULAR | Status: DC
  Administered 2023-10-13: 2 [IU] via SUBCUTANEOUS

## 2023-10-13 MED ORDER — WARFARIN SODIUM 2.5 MG PO TABS
2.5000 mg | ORAL_TABLET | Freq: Once | ORAL | Status: AC
  Administered 2023-10-13: 2.5 mg via ORAL
  Filled 2023-10-13: qty 1

## 2023-10-13 MED ORDER — ASPIRIN 81 MG PO CHEW
81.0000 mg | CHEWABLE_TABLET | Freq: Every day | ORAL | Status: DC
  Administered 2023-10-13 – 2023-10-14 (×2): 81 mg via ORAL
  Filled 2023-10-13 (×2): qty 1

## 2023-10-13 NOTE — Plan of Care (Signed)

## 2023-10-13 NOTE — Plan of Care (Signed)
  Problem: Education: Goal: Knowledge of General Education information will improve Description: Including pain rating scale, medication(s)/side effects and non-pharmacologic comfort measures 10/13/2023 0332 by Charmian Muff, RN Outcome: Progressing 10/13/2023 0002 by Charmian Muff, RN Outcome: Progressing   Problem: Health Behavior/Discharge Planning: Goal: Ability to manage health-related needs will improve 10/13/2023 0332 by Charmian Muff, RN Outcome: Progressing 10/13/2023 0002 by Charmian Muff, RN Outcome: Progressing   Problem: Clinical Measurements: Goal: Ability to maintain clinical measurements within normal limits will improve 10/13/2023 0332 by Charmian Muff, RN Outcome: Progressing 10/13/2023 0002 by Charmian Muff, RN Outcome: Progressing Goal: Will remain free from infection Outcome: Progressing

## 2023-10-13 NOTE — Progress Notes (Addendum)
 6 Days Post-Op Procedure(s) (LRB): MITRAL VALVE REPLACEMENT USING SJM MASTERS SERIES MECHANICAL MITRAL HEART VALVE SIZE (N/A) MAZE PROCEDURE (N/A) ECHOCARDIOGRAM, TRANSESOPHAGEAL, INTRAOPERATIVE (N/A) CLIPPING, LEFT ATRIAL APPENDAGE USING MEDTRONIC PENDITURE LAA EXCLUSION SYSTEM SIZE Subjective: Conts to feel well  Objective: Vital signs in last 24 hours: Temp:  [97.3 F (36.3 C)-98.4 F (36.9 C)] 97.3 F (36.3 C) (03/23 0325) Pulse Rate:  [28-77] 77 (03/22 1732) Cardiac Rhythm: Normal sinus rhythm;Bundle branch block (03/22 2100) Resp:  [14-23] 23 (03/23 0550) BP: (107-140)/(38-87) 112/71 (03/23 0325) SpO2:  [90 %-96 %] 94 % (03/23 0325) Weight:  [93.6 kg] 93.6 kg (03/23 0550)  Hemodynamic parameters for last 24 hours:    Intake/Output from previous day: 03/22 0701 - 03/23 0700 In: 100 [IV Piggyback:100] Out: -  Intake/Output this shift: No intake/output data recorded.  General appearance: alert, cooperative, and no distress Heart: irregularly irregular rhythm Lungs: clear to auscultation bilaterally Abdomen: benign Extremities: no edema or calf tenderness Wound: healing well  Lab Results: Recent Labs    10/11/23 0349 10/12/23 0313  WBC 20.4* 17.4*  HGB 9.0* 9.0*  HCT 28.4* 27.7*  PLT 168 242   BMET:  Recent Labs    10/12/23 0313 10/13/23 0311  NA 129* 129*  K 4.0 3.9  CL 92* 91*  CO2 27 29  GLUCOSE 94 121*  BUN 18 14  CREATININE 0.79 0.83  CALCIUM 8.2* 8.4*    PT/INR:  Recent Labs    10/13/23 0311  LABPROT 25.5*  INR 2.3*   ABG    Component Value Date/Time   PHART 7.327 (L) 10/07/2023 1704   HCO3 22.5 10/07/2023 1704   TCO2 24 10/07/2023 1704   ACIDBASEDEF 4.0 (H) 10/07/2023 1704   O2SAT 97 10/07/2023 1704   CBG (last 3)  Recent Labs    10/12/23 2104 10/13/23 0032 10/13/23 0547  GLUCAP 115* 97 103*    Meds Scheduled Meds:  amiodarone  400 mg Oral BID   aspirin  81 mg Oral Daily   bisacodyl  10 mg Oral Daily   Or    bisacodyl  10 mg Rectal Daily   busPIRone  5 mg Oral TID   docusate sodium  200 mg Oral Daily   DULoxetine  40 mg Oral Daily   Fe Fum-Vit C-Vit B12-FA  1 capsule Oral Daily   feeding supplement  237 mL Oral BID BM   furosemide  40 mg Oral Daily   insulin aspart  0-24 Units Subcutaneous Q6H   isosorbide-hydrALAZINE  0.5 tablet Oral TID   loratadine  10 mg Oral Daily   pantoprazole  40 mg Oral Daily   potassium chloride  20 mEq Oral Daily   rosuvastatin  40 mg Oral Daily   sodium chloride flush  3 mL Intravenous Q12H   Warfarin - Physician Dosing Inpatient   Does not apply q1600   Continuous Infusions:  cefTRIAXone (ROCEPHIN)  IV Stopped (10/12/23 0932)   PRN Meds:.guaiFENesin, ondansetron (ZOFRAN) IV, oxyCODONE, sodium chloride flush, traMADol  Xrays No results found.  Assessment/Plan: S/P Procedure(s) (LRB): MITRAL VALVE REPLACEMENT USING SJM MASTERS SERIES MECHANICAL MITRAL HEART VALVE SIZE (N/A) MAZE PROCEDURE (N/A) ECHOCARDIOGRAM, TRANSESOPHAGEAL, INTRAOPERATIVE (N/A) CLIPPING, LEFT ATRIAL APPENDAGE USING MEDTRONIC PENDITURE LAA EXCLUSION SYSTEM SIZE POD#6  1 afeb, VSS, sinus rhythm, BBB, current afib w/CVR,  baby asa added, on po amio, bidil- unable to increase GDMT with BP's relatively low at times- plan for outpatient management - EP has been following- signed off  today 2 sats ok on 2 liters 3 weight stable, about 5 kg>preop if accurate- now on 40 mg lasix daily- voiding amts not recorded 4 BS controlled- outpatient f/u for borderline DM- no current outpatient meds 5 sodium stable at 129 6 renal fxn remains in normal range 7 INR 2.3- cont coumadin 2.5 today 8 conts rehab and pulm hygiene, poss d/c tomorrow    LOS: 10 days    Rowe Clack PA-C Pager [phone] 10/13/2023

## 2023-10-13 NOTE — Progress Notes (Signed)
 Mobility Specialist Progress Note:   10/13/23 1141  Mobility  Activity Ambulated with assistance in room;Ambulated with assistance in hallway  Level of Assistance Standby assist, set-up cues, supervision of patient - no hands on  Assistive Device Front wheel walker  Distance Ambulated (ft) 200 ft  RUE Weight Bearing Per Provider Order NWB  LUE Weight Bearing Per Provider Order NWB  Activity Response Tolerated well  Mobility Referral Yes  Mobility visit 1 Mobility  Mobility Specialist Start Time (ACUTE ONLY) 1130  Mobility Specialist Stop Time (ACUTE ONLY) 1140  Mobility Specialist Time Calculation (min) (ACUTE ONLY) 10 min   Pt received in bed, agreeable to mobility session. Ambulated in hallway with RW and SBA. Tolerated well. Took 2 standing rest breaks when SpO2 86% on RA. Recovered, SpO2 96% on RA. Returned pt to room, all needs met.   Feliciana Rossetti Mobility Specialist Please contact via Special educational needs teacher or  Rehab office at [phone]

## 2023-10-13 NOTE — Progress Notes (Signed)
   Rounding Note    Patient Name: Sandra Hines Date of Encounter: 10/13/2023  [facility] Cardiologist: Julien Nordmann, MD   Subjective   NAEO. No complaints this AM.  Vital Signs    Vitals:   10/13/23 0000 10/13/23 0325 10/13/23 0550 10/13/23 0820  BP: (!) 140/87 112/71  125/72  Pulse:    78  Resp: 14 19 (!) 23 16  Temp: 98 F (36.7 C) (!) 97.3 F (36.3 C)  97.7 F (36.5 C)  TempSrc: Oral Oral  Oral  SpO2:  94%  99%  Weight:   93.6 kg   Height:        Intake/Output Summary (Last 24 hours) at 10/13/2023 1610 Last data filed at 10/12/2023 1542 Gross per 24 hour  Intake 100 ml  Output --  Net 100 ml      10/13/2023    5:50 AM 10/12/2023    6:14 AM 10/11/2023    5:00 AM  Last 3 Weights  Weight (lbs) 206 lb 4.8 oz 205 lb 1.6 oz 213 lb 13.5 oz  Weight (kg) 93.577 kg 93.033 kg 97 kg      Telemetry    No severe bradycardia episodes. - Personally Reviewed  Physical Exam   GEN: No acute distress.   Cardiac: RRR, no murmurs, rubs, or gallops.  Respiratory: Clear to auscultation bilaterally. Psych: Normal affect   Assessment & Plan    #AF #Intermittent bradycardia Improving. S/P MAZE and LAAO intra-op. No significant bradycardia. No indication for permanent pacing at this time. Continue Amiodarone 400mg  PO BID x 5 days followed by 400mg  PO daily x 5 days followed by 200mg  PO daily thereafter. Will need CMP, TSH and FT4 in 4-6 weeks. Continue coumadin.  #Severe MR Hemodynamically stable. Recovering well post op.  #Acute on chronic systolic HF NYHA II. Recovering post surgery. Intermittent low BP's are preventing addition of GDMT, will need to reassess in outpatient setting. - cont bidil   EP will sign off for now. Please call with questions/concerns.   Sheria Lang T. Lalla Brothers, MD, [facility], [facility] Cardiac Electrophysiology

## 2023-10-14 ENCOUNTER — Other Ambulatory Visit: Payer: Self-pay | Admitting: Family Medicine

## 2023-10-14 ENCOUNTER — Other Ambulatory Visit: Payer: Self-pay | Admitting: Thoracic Surgery (Cardiothoracic Vascular Surgery)

## 2023-10-14 DIAGNOSIS — F39 Unspecified mood [affective] disorder: Secondary | ICD-10-CM

## 2023-10-14 LAB — BASIC METABOLIC PANEL
Anion gap: 6 (ref 5–15)
BUN: 10 mg/dL (ref 6–20)
CO2: 30 mmol/L (ref 22–32)
Calcium: 8 mg/dL — ABNORMAL LOW (ref 8.9–10.3)
Chloride: 92 mmol/L — ABNORMAL LOW (ref 98–111)
Creatinine, Ser: 0.89 mg/dL (ref 0.44–1.00)
GFR, Estimated: >60 mL/min (ref >60)
Glucose, Bld: 93 mg/dL (ref 70–99)
Potassium: 3.9 mmol/L (ref 3.5–5.1)
Sodium: 128 mmol/L — ABNORMAL LOW (ref 135–145)

## 2023-10-14 LAB — GLUCOSE, CAPILLARY
Glucose-Capillary: 90 mg/dL (ref 70–99)
Glucose-Capillary: 117 mg/dL — ABNORMAL HIGH (ref 70–99)

## 2023-10-14 LAB — PROTIME-INR
INR: 2.7 — ABNORMAL HIGH (ref 0.8–1.2)
Prothrombin Time: 28.6 s — ABNORMAL HIGH (ref 11.4–15.2)

## 2023-10-14 MED ORDER — WARFARIN SODIUM 2.5 MG PO TABS: 2.5000 mg | ORAL_TABLET | Freq: Once | ORAL | Status: DC

## 2023-10-14 MED ORDER — OXYCODONE HCL 5 MG PO TABS: 5.0000 mg | ORAL_TABLET | Freq: Four times a day (QID) | ORAL | 0 refills | Status: DC | PRN

## 2023-10-14 MED ORDER — ISOSORB DINITRATE-HYDRALAZINE 20-37.5 MG PO TABS: 0.5000 | ORAL_TABLET | Freq: Three times a day (TID) | ORAL | 1 refills | Status: DC

## 2023-10-14 MED ORDER — IPRATROPIUM-ALBUTEROL 0.5-2.5 (3) MG/3ML IN SOLN: 3.0000 mL | RESPIRATORY_TRACT | Status: DC | PRN

## 2023-10-14 MED ORDER — ASPIRIN 81 MG PO CHEW: 81.0000 mg | CHEWABLE_TABLET | Freq: Every day | ORAL | Status: DC

## 2023-10-14 MED ORDER — WARFARIN SODIUM 2.5 MG PO TABS: 2.5000 mg | ORAL_TABLET | ORAL | 1 refills | Status: DC

## 2023-10-14 MED ORDER — AMIODARONE HCL 200 MG PO TABS: 400.0000 mg | ORAL_TABLET | Freq: Two times a day (BID) | ORAL | 1 refills | Status: DC

## 2023-10-14 MED ORDER — FE FUM-VIT C-VIT B12-FA 460-60-0.01-1 MG PO CAPS: 1.0000 | ORAL_CAPSULE | Freq: Every day | ORAL | 1 refills | Status: DC

## 2023-10-14 NOTE — Progress Notes (Signed)
 7 Days Post-Op Procedure(s) (LRB): MITRAL VALVE REPLACEMENT USING SJM MASTERS SERIES MECHANICAL MITRAL HEART VALVE SIZE (N/A) MAZE PROCEDURE (N/A) ECHOCARDIOGRAM, TRANSESOPHAGEAL, INTRAOPERATIVE (N/A) CLIPPING, LEFT ATRIAL APPENDAGE USING MEDTRONIC PENDITURE LAA EXCLUSION SYSTEM SIZE Subjective: Feels generally well  Objective: Vital signs in last 24 hours: Temp:  [97.7 F (36.5 C)-98.1 F (36.7 C)] 98.1 F (36.7 C) (03/24 0340) Pulse Rate:  [76-81] 76 (03/24 0340) Cardiac Rhythm: Normal sinus rhythm (03/23 2000) Resp:  [13-24] 24 (03/24 0340) BP: (101-140)/(62-96) 117/62 (03/24 0340) SpO2:  [91 %-100 %] 100 % (03/24 0340) Weight:  [93.7 kg] 93.7 kg (03/24 0340)  Hemodynamic parameters for last 24 hours:    Intake/Output from previous day: 03/23 0701 - 03/24 0700 In: 100 [IV Piggyback:100] Out: 825 [Urine:825] Intake/Output this shift: No intake/output data recorded.  General appearance: alert, cooperative, and no distress Heart: regular rate and rhythm and crisp valve click Lungs: clear Abdomen: benign Extremities: no edema Wound: healing well  Lab Results: Recent Labs    10/12/23 0313 10/13/23 0848  WBC 17.4* 16.3*  HGB 9.0* 9.0*  HCT 27.7* 27.9*  PLT 242 320   BMET:  Recent Labs    10/13/23 0311 10/14/23 0348  NA 129* 128*  K 3.9 3.9  CL 91* 92*  CO2 29 30  GLUCOSE 121* 93  BUN 14 10  CREATININE 0.83 0.89  CALCIUM 8.4* 8.0*    PT/INR:  Recent Labs    10/14/23 0348  LABPROT 28.6*  INR 2.7*   ABG    Component Value Date/Time   PHART 7.327 (L) 10/07/2023 1704   HCO3 22.5 10/07/2023 1704   TCO2 24 10/07/2023 1704   ACIDBASEDEF 4.0 (H) 10/07/2023 1704   O2SAT 97 10/07/2023 1704   CBG (last 3)  Recent Labs    10/13/23 1551 10/13/23 2114 10/14/23 0616  GLUCAP 114* 127* 90    Meds Scheduled Meds:  amiodarone  400 mg Oral BID   aspirin  81 mg Oral Daily   bisacodyl  10 mg Oral Daily   Or   bisacodyl  10 mg Rectal Daily    busPIRone  5 mg Oral TID   docusate sodium  200 mg Oral Daily   DULoxetine  40 mg Oral Daily   Fe Fum-Vit C-Vit B12-FA  1 capsule Oral Daily   feeding supplement  237 mL Oral BID BM   furosemide  40 mg Oral Daily   insulin aspart  0-24 Units Subcutaneous TID WC   isosorbide-hydrALAZINE  0.5 tablet Oral TID   loratadine  10 mg Oral Daily   pantoprazole  40 mg Oral Daily   potassium chloride  20 mEq Oral Daily   rosuvastatin  40 mg Oral Daily   sodium chloride flush  3 mL Intravenous Q12H   Warfarin - Physician Dosing Inpatient   Does not apply q1600   Continuous Infusions:  cefTRIAXone (ROCEPHIN)  IV Stopped (10/13/23 0905)   PRN Meds:.guaiFENesin, ondansetron (ZOFRAN) IV, oxyCODONE, sodium chloride flush, traMADol  Xrays No results found.  Assessment/Plan: S/P Procedure(s) (LRB): MITRAL VALVE REPLACEMENT USING SJM MASTERS SERIES MECHANICAL MITRAL HEART VALVE SIZE (N/A) MAZE PROCEDURE (N/A) ECHOCARDIOGRAM, TRANSESOPHAGEAL, INTRAOPERATIVE (N/A) CLIPPING, LEFT ATRIAL APPENDAGE USING MEDTRONIC PENDITURE LAA EXCLUSION SYSTEM SIZE POD#7  1 afeb, VSS, tele, some afib,sinus,  some PVC's, LBBB 2 intermit on O2 at 2 liters, some times on RA 3 UOP ok, not all recorded- will stop lasix- weight stable, sodium 128 4 BS controlled 5 normal renal fxn  6 INR 2.7- will cont coumadin 2.5- needs coumadin clinic appt, will send another message to cards 7 will do walk test to see if needs home O2 8 hopefully home later today when all arrangements made     LOS: 11 days    Rowe Clack PA-C Pager [phone] 10/14/2023

## 2023-10-14 NOTE — Progress Notes (Signed)
 CARDIAC REHAB PHASE I   Pt ready for discharge home today. Postop [facility] education complete. Referral for CRP2 sent to [facility].   Woodroe Chen, RN BSN 10/14/2023 12:04 PM

## 2023-10-14 NOTE — Telephone Encounter (Signed)
[facility] faxed refill request for the following medications:  traZODone (DESYREL) 50 MG tablet   Please advise.  

## 2023-10-14 NOTE — Telephone Encounter (Signed)
 LOV 2*13*25 NOV none on file LRF [id number] LABS [phone]

## 2023-10-14 NOTE — TOC Progression Note (Signed)
 Transition of Care [facility]) - Progression Note    Patient Details  Name: Sandra Hines MRN: [id number] Date of Birth: [birth year]  Transition [facility] CM/SW Contact  Elliot Cousin, RN Phone Number: [phone] 10/14/2023, 9:51 AM  Clinical Narrative:     TOC CM spoke to pt and states she lives in home with adult children. Waiting PT evaluation for DME and HH for home. Pt agreeable to [facility]. She is requesting Rollator for home.     Barriers to Discharge: Continued Medical Work up  Expected Discharge Plan and Services     Post Acute Care Choice: Home Health                                         Social Determinants of Health (SDOH) Interventions SDOH Screenings   Food Insecurity: No Food Insecurity (10/03/2023)  Housing: Low Risk  (10/03/2023)  Transportation Needs: No Transportation Needs (10/03/2023)  Utilities: Not At Risk (10/03/2023)  Depression (PHQ2-9): High Risk (09/25/2023)  Financial Resource Strain: Medium Risk (09/30/2023)  Social Connections: Moderately Integrated (10/03/2023)  Tobacco Use: High Risk (10/07/2023)    Readmission Risk Interventions     No data to display

## 2023-10-14 NOTE — TOC Transition Note (Addendum)
 Transition of Care (TOC) - Discharge Note Donn Pierini RN, BSN Transitions of Care Unit 4E- RN Case Manager See Treatment Team for direct phone #   Patient Details  Name: Sandra Hines MRN: [id number] Date of Birth: [birth year]  Transition of Care [facility]) CM/SW Contact:  Darrold Span, RN Phone Number: 10/14/2023, 12:15 PM   Clinical Narrative:    Pt stable for transition home today, per ambulatory sat note- pt does not qualify for home 02 and will not need 02 for discharge.  Orders for DME- Rollator and BSC placed.   Cm spoke with pt at bedside- confirmed DME- pt with no preference for provider as long as they are in-network with her insurance.  [facility]- Jeannine Kitten- referral given- they take insurance and liaison to deliver DME to room prior to discharge- ETA for delivery around 1:30pm.-- update 1353- pt to be taken to d/c lounge- [employer] to deliver DME there- liaison aware.    No HH needs noted. Family to transport home.    Final next level of care: Home/Self Care Barriers to Discharge: Barriers Resolved   Patient Goals and CMS Choice Patient states their goals for this hospitalization and ongoing recovery are:: wants to get better CMS Medicare.gov Compare Post Acute Care list provided to:: Patient Choice offered to / list presented to : Patient      Discharge Placement               Home        Discharge Plan and Services Additional resources added to the After Visit Summary for     Discharge Planning Services: CM Consult Post Acute Care Choice: Home Health          DME Arranged: Bedside commode, Walker rolling with seat DME Agency: [facility] Date DME Agency Contacted: 10/14/23 Time DME Agency Contacted: 1115 Representative spoke with at DME Agency: Zollie Beckers HH Arranged: NA HH Agency: NA        Social Drivers of Health (SDOH) Interventions SDOH Screenings   Food Insecurity: No Food Insecurity (10/03/2023)  Housing: Low Risk  (10/03/2023)   Transportation Needs: No Transportation Needs (10/03/2023)  Utilities: Not At Risk (10/03/2023)  Depression (PHQ2-9): High Risk (09/25/2023)  Financial Resource Strain: Medium Risk (09/30/2023)  Social Connections: Moderately Integrated (10/03/2023)  Tobacco Use: High Risk (10/07/2023)     Readmission Risk Interventions    10/14/2023   12:15 PM  Readmission Risk Prevention Plan  Transportation Screening Complete  PCP or Specialist Appt within 5-7 Days Complete  Home Care Screening Complete  Medication Review (RN CM) Complete

## 2023-10-14 NOTE — Consult Note (Signed)
 [facility] Liaison Consult Note   10/14/2023  Sandra Hines 02/13/1971 [id number]  Insurance: Monia Pouch  Primary Care Provider: Ronnald Ramp, MD, with [facility]  this provider is listed for the transition of care follow up appointments  and ***calls   RN Hospital Liaison [facility] Liaison Assessment Location:[id number]}    The patient was screened for [facility] Liaison 7 and 30 day Readmission:[id number]} day readmission hospitalization with noted [facility] Liaison Readmission Risk Score:[id number]} risk score for unplanned readmission risk ***hospital admissions in 6 months.  The patient was assessed for potential [facility] Coordination service needs for post hospital transition for care coordination. Review of patient's electronic medical record reveals patient is ***.   Plan: [facility] Liaison will continue to follow progress and disposition to assess for post hospital community care coordination/management needs.  Referral request for community care coordination: ***   VBCI Community Care, Population Health does not replace or interfere with any arrangements made by the Inpatient Transition of Care team.   For questions contact:   Charlesetta Shanks, RN, BSN, CCM [place]  [facility] Liaison Direct Dial: [phone] or secure chat Email: [email or web address]

## 2023-10-14 NOTE — Progress Notes (Signed)
 Mobility Specialist Progress Note:  Nurse requested Mobility Specialist to perform oxygen saturation test with pt which includes removing pt from oxygen both at rest and while ambulating.  Below are the results from that testing.     Patient Saturations on Room Air at Rest = spO2 98%  Patient Saturations on Room Air while Ambulating = sp02 90% .  Rested and performed pursed lip breathing for 1 minute with sp02 at 93%.  At end of testing pt left in room on 0  Liters of oxygen.  Reported results to nurse.    Thompson Grayer Mobility Specialist  Please contact vis Secure Chat or  Rehab Office [phone]

## 2023-10-14 NOTE — Progress Notes (Signed)
 D/c tele and IV. Removed chest tube suture per  order. Pt tolerated well.   Lawson Radar, RN

## 2023-10-14 NOTE — Progress Notes (Addendum)
 Discharge instructions reviewed with pt.  Copy of instructions given to pt. Pt informed her scripts were sent to her pharmacy to pick up. Pt also given 2 hard copy scripts for her coumadin to take to her pharmacy. Coumadin education provided, handouts reviewed and given to pt.  Pt has DME rollator and BSC to be delivered to the discharge lounge, as pt is going there while she waits for her DME and her ride.  Pt will be d/c'd via wheelchair with belongings and will be escorted by staff to the discharge lounge.   Maybelline Kolarik,RN SWOT

## 2023-10-14 NOTE — Plan of Care (Signed)
  Problem: Education: Goal: Knowledge of General Education information will improve Description: Including pain rating scale, medication(s)/side effects and non-pharmacologic comfort measures Outcome: Progressing   Problem: Health Behavior/Discharge Planning: Goal: Ability to manage health-related needs will improve Outcome: Progressing   Problem: Clinical Measurements: Goal: Ability to maintain clinical measurements within normal limits will improve Outcome: Progressing Goal: Will remain free from infection Outcome: Progressing Goal: Diagnostic test results will improve Outcome: Progressing Goal: Respiratory complications will improve Outcome: Progressing Goal: Cardiovascular complication will be avoided Outcome: Progressing   Problem: Activity: Goal: Risk for activity intolerance will decrease Outcome: Progressing   Problem: Nutrition: Goal: Adequate nutrition will be maintained Outcome: Progressing   Problem: Coping: Goal: Level of anxiety will decrease Outcome: Progressing   Problem: Elimination: Goal: Will not experience complications related to bowel motility Outcome: Progressing Goal: Will not experience complications related to urinary retention Outcome: Progressing   Problem: Pain Managment: Goal: General experience of comfort will improve and/or be controlled Outcome: Progressing   Problem: Safety: Goal: Ability to remain free from injury will improve Outcome: Progressing   Problem: Skin Integrity: Goal: Risk for impaired skin integrity will decrease Outcome: Progressing   Problem: Education: Goal: Knowledge of General Education information will improve Description: Including pain rating scale, medication(s)/side effects and non-pharmacologic comfort measures Outcome: Progressing   Problem: Health Behavior/Discharge Planning: Goal: Ability to manage health-related needs will improve Outcome: Progressing   Problem: Clinical Measurements: Goal:  Ability to maintain clinical measurements within normal limits will improve Outcome: Progressing Goal: Will remain free from infection Outcome: Progressing Goal: Diagnostic test results will improve Outcome: Progressing Goal: Respiratory complications will improve Outcome: Progressing Goal: Cardiovascular complication will be avoided Outcome: Progressing   Problem: Activity: Goal: Risk for activity intolerance will decrease Outcome: Progressing   Problem: Nutrition: Goal: Adequate nutrition will be maintained Outcome: Progressing   Problem: Coping: Goal: Level of anxiety will decrease Outcome: Progressing   Problem: Elimination: Goal: Will not experience complications related to bowel motility Outcome: Progressing Goal: Will not experience complications related to urinary retention Outcome: Progressing   Problem: Pain Managment: Goal: General experience of comfort will improve and/or be controlled Outcome: Progressing   Problem: Safety: Goal: Ability to remain free from injury will improve Outcome: Progressing   Problem: Skin Integrity: Goal: Risk for impaired skin integrity will decrease Outcome: Progressing   Problem: Education: Goal: Will demonstrate proper wound care and an understanding of methods to prevent future damage Outcome: Progressing Goal: Knowledge of disease or condition will improve Outcome: Progressing Goal: Knowledge of the prescribed therapeutic regimen will improve Outcome: Progressing Goal: Individualized Educational Video(s) Outcome: Progressing   Problem: Activity: Goal: Risk for activity intolerance will decrease Outcome: Progressing   Problem: Cardiac: Goal: Will achieve and/or maintain hemodynamic stability Outcome: Progressing   Problem: Clinical Measurements: Goal: Postoperative complications will be avoided or minimized Outcome: Progressing   Problem: Respiratory: Goal: Respiratory status will improve Outcome:  Progressing   Problem: Skin Integrity: Goal: Wound healing without signs and symptoms of infection Outcome: Progressing Goal: Risk for impaired skin integrity will decrease Outcome: Progressing   Problem: Urinary Elimination: Goal: Ability to achieve and maintain adequate renal perfusion and functioning will improve Outcome: Progressing

## 2023-10-14 NOTE — Progress Notes (Signed)
 [facility] [address] [place] unable to fill [facility]codone.  Conformed with pharmacist  New prescription sent to [facility] C. Dorris Fetch, MD [facility] [phone]

## 2023-10-15 NOTE — Telephone Encounter (Signed)
 Pt states that she does not need this refill at this time due to it interacting with another medication.  HFU was scheduled for 10/23/2023 at 2pm

## 2023-10-15 NOTE — Telephone Encounter (Signed)
 Can someone please reach out to offer this pt a hospital follow up appt per Edmon Crape, this needs to be done before the requested medication can be filled

## 2023-10-17 NOTE — Progress Notes (Signed)
 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 Leafy Ro on 10/07/2023. She was discharged on 10/14/2023. She was most recently hospitalized 03/29 through 04/01 for PND, LE edema. She was diuresed with Lasix. Advanced heart failure was consulted and Bidil and Losartan were stopped and she was started on Entresto 24/26 bid and Jardiance. Echocardiogram done showed LVEF 25-30%, RV function severely reduced from previous and trivial MR. She had leukocytosis and blood cultures were negative. She was also put on Azithromycin and Rocephin and was discharged on Omnicef. She presents today for routine one week post op follow up.  She did slip in shower today, but denies any injury or hitting her head. She denies chest pain, dizziness, shortness of breath or LE edema.  Current Outpatient Medications  Medication Sig Dispense Refill   acetaminophen (TYLENOL) 325 MG tablet Take 2 tablets (650 mg total) by mouth every 4 (four) hours as needed for headache or mild pain (pain score 1-3).     amiodarone (PACERONE) 200 MG tablet Take 2 tablets (400 mg total) by mouth 2 (two) times daily. For 5 days, then 400 mg daily for 5 days, then 200 mg daily 50 tablet 1   aspirin 81 MG chewable tablet Chew 1 tablet (81 mg total) by mouth daily.     busPIRone (BUSPAR) 5 MG tablet Take 1 tablet (5 mg total) by mouth 3 (three) times daily. 90 tablet 2   DULoxetine (CYMBALTA) 20 MG capsule Take 2 capsules (40 mg total) by mouth daily. 180 capsule 1   Fe Fum-Vit C-Vit B12-FA (TRIGELS-F FORTE) CAPS capsule Take 1 capsule by mouth daily. 30 capsule 1   ipratropium-albuterol (DUONEB) 0.5-2.5 (3) MG/3ML SOLN Take 3 mLs by nebulization every 2 (two) hours as needed (shortness of breath).      isosorbide-hydrALAZINE (BIDIL) 20-37.5 MG tablet Take 0.5 tablets by mouth 3 (three) times daily. 45 tablet 1   magnesium hydroxide (MILK OF MAGNESIA) 400 MG/5ML suspension Take 30 mLs by mouth daily as needed for mild constipation or moderate constipation.     oxyCODONE (OXY IR/ROXICODONE) 5 MG immediate release tablet Take 1 tablet (5 mg total) by mouth every 6 (six) hours as needed for up to 7 days for severe pain (pain score 7-10). 28 tablet 0   rosuvastatin (CRESTOR) 40 MG tablet Take 1 tablet (40 mg total) by mouth daily. 90 tablet 3   warfarin (COUMADIN) 2.5 MG tablet Take 1 tablet (2.5 mg total) by mouth as directed. Alternating schedule every 3 days, take 2.5 mg daily for 2 days, then 5 mg daily- then repeat 3 day cycle. Then per coumadin clinic 100 tablet 1  Vital Signs: Vitals:   10/23/23 1433  BP: 122/86  Pulse: 80  Resp: 20  SpO2: 94%      Physical Exam: CV-RRR, sharp valve click Pulmonary-Slightly diminished left base, right lung is clear Extremities-No LE edema Wound-Clean, dry, no sign of infection  Diagnostic Tests:  Narrative & Impression  CLINICAL DATA:  [id number] Chest pain [id number]   EXAM: PORTABLE CHEST 1 VIEW   COMPARISON:  10/11/2023   FINDINGS: Stable cardiomegaly status post sternotomy, mitral valve replacement, and  left atrial appendage clipping. Persistent left basilar opacity. Probable small left pleural effusion. Band-like atelectasis in the left mid lung. Mild right perihilar interstitial opacity. No right-sided pleural effusion. No pneumothorax.   IMPRESSION: 1. Persistent left basilar opacity, atelectasis versus pneumonia. Probable small left pleural effusion. 2. Mild right perihilar interstitial opacity, which may reflect edema versus infection.     Electronically Signed   By: Duanne Guess D.O.   On: 10/19/2023 08:32      Impression and Plan:  We discussed the results of today's chest x ray. Latest PT/INR was 2.3 on 04/01. She had  an appointment for PT/INR to be drawn today, but she thought that appointment was going to be next week. [facility] will contact patient with next PT/INR and instructions for Lovenox/Coumadin.  We discussed that she is to continue to avoid any heavy lifting or strenuous use of your arms or shoulders for at least a total of two months from the time of surgery.  After two months, you may gradually increase how much you lift or otherwise use your arms or chest as tolerated, with limits based upon whether or not activities lead to the return of significant discomfort. We will discuss endocarditis prophylaxis, driving and participation in cardiac rehab at the next office visit. She will return to see Dr. Leafy Ro in 2 weeks with PA/LAT CXR. She  has an appointment to see cardiology 04/04, advanced heart failure appointment on 04/08, and a follow up echocardiogram on 11/18/2023.    Ardelle Balls, PA-C [facility] [phone]

## 2023-10-18 ENCOUNTER — Encounter: Payer: Self-pay | Admitting: Cardiovascular Disease

## 2023-10-18 ENCOUNTER — Other Ambulatory Visit: Payer: Self-pay | Admitting: *Deleted

## 2023-10-18 ENCOUNTER — Other Ambulatory Visit: Payer: Self-pay

## 2023-10-18 ENCOUNTER — Telehealth: Payer: Self-pay | Admitting: *Deleted

## 2023-10-18 ENCOUNTER — Ambulatory Visit: Attending: Family Medicine

## 2023-10-18 ENCOUNTER — Other Ambulatory Visit (HOSPITAL_COMMUNITY): Payer: Self-pay

## 2023-10-18 DIAGNOSIS — Z952 Presence of prosthetic heart valve: Secondary | ICD-10-CM

## 2023-10-18 NOTE — Telephone Encounter (Signed)
 Called pt to reschedule appt she missed today.  Pt lives in [place] but has other appts scheduled in [place] next week. She states she will need to consolidate appts.   Advised since INR was rising at discharge from the hospital after a few doses of warfarin and her being on Amio 400mg  bid x 5 days and currently on amio 400mg  every day to start taking warfarin 2.5mg  daily and not the alternating dose. She verbalized understanding. Advised the amio interacts with warfarin and can cause the level to go too high.    She states she is in a lot of pain and needs more meds and having swelling. Advised that she needs to call the surgeon office for those and she state she will.   Also, advised to come early on 4/2 so we can get her completed and over to the surgeons appt and she verbalized understanding.

## 2023-10-18 NOTE — Telephone Encounter (Addendum)
 Called pt since she missed her Anticoagulation Appt at 1030am today. She walked into the building at 11am for 1030am appt. Registration called back and I advised to have patient wait in the waiting room and we will have a lab drawn. Went out to look for patient and no answer x 3 different times. Registration Dagoberto Reef states pt was waiting in the waiting room and then noted she went out the exit door.     Pt stated via phone that she did not feel well and did not feel like waiting so she left advised that we need the INR lab and she stated just reschedule me. I stated okay we can get you rescheduled and the phone hung up.  Called pt back and was sent to voicemail and it was full. Need to reschedule pt and see if [place] office is better for her or not.   Will try to call her again to get her rescheduled.

## 2023-10-19 ENCOUNTER — Inpatient Hospital Stay
Admission: EM | Admit: 2023-10-19 | Discharge: 2023-10-22 | DRG: 280 | Disposition: A | Discharge status: Home / Self Care | Attending: Student | Admitting: Student

## 2023-10-19 ENCOUNTER — Observation Stay

## 2023-10-19 ENCOUNTER — Emergency Department

## 2023-10-19 ENCOUNTER — Other Ambulatory Visit: Payer: Self-pay

## 2023-10-19 ENCOUNTER — Encounter: Payer: Self-pay | Admitting: Emergency Medicine

## 2023-10-19 DIAGNOSIS — D696 Thrombocytopenia, unspecified: Secondary | ICD-10-CM | POA: Diagnosis present

## 2023-10-19 DIAGNOSIS — F17200 Nicotine dependence, unspecified, uncomplicated: Secondary | ICD-10-CM | POA: Diagnosis present

## 2023-10-19 DIAGNOSIS — J9811 Atelectasis: Secondary | ICD-10-CM | POA: Diagnosis not present

## 2023-10-19 DIAGNOSIS — E041 Nontoxic single thyroid nodule: Secondary | ICD-10-CM | POA: Diagnosis not present

## 2023-10-19 DIAGNOSIS — E559 Vitamin D deficiency, unspecified: Secondary | ICD-10-CM | POA: Diagnosis present

## 2023-10-19 DIAGNOSIS — I700 Atherosclerosis of aorta: Secondary | ICD-10-CM | POA: Diagnosis not present

## 2023-10-19 DIAGNOSIS — I5033 Acute on chronic diastolic (congestive) heart failure: Secondary | ICD-10-CM | POA: Diagnosis not present

## 2023-10-19 DIAGNOSIS — I5021 Acute systolic (congestive) heart failure: Secondary | ICD-10-CM

## 2023-10-19 DIAGNOSIS — R918 Other nonspecific abnormal finding of lung field: Secondary | ICD-10-CM | POA: Diagnosis not present

## 2023-10-19 DIAGNOSIS — J810 Acute pulmonary edema: Secondary | ICD-10-CM | POA: Diagnosis not present

## 2023-10-19 DIAGNOSIS — F39 Unspecified mood [affective] disorder: Secondary | ICD-10-CM | POA: Diagnosis present

## 2023-10-19 DIAGNOSIS — D72829 Elevated white blood cell count, unspecified: Secondary | ICD-10-CM | POA: Diagnosis not present

## 2023-10-19 DIAGNOSIS — I340 Nonrheumatic mitral (valve) insufficiency: Secondary | ICD-10-CM | POA: Diagnosis present

## 2023-10-19 DIAGNOSIS — I4891 Unspecified atrial fibrillation: Secondary | ICD-10-CM | POA: Diagnosis not present

## 2023-10-19 DIAGNOSIS — Z79899 Other long term (current) drug therapy: Secondary | ICD-10-CM

## 2023-10-19 DIAGNOSIS — E785 Hyperlipidemia, unspecified: Secondary | ICD-10-CM | POA: Diagnosis present

## 2023-10-19 DIAGNOSIS — I447 Left bundle-branch block, unspecified: Secondary | ICD-10-CM | POA: Diagnosis present

## 2023-10-19 DIAGNOSIS — I10 Essential (primary) hypertension: Secondary | ICD-10-CM | POA: Diagnosis present

## 2023-10-19 DIAGNOSIS — E875 Hyperkalemia: Secondary | ICD-10-CM | POA: Diagnosis present

## 2023-10-19 DIAGNOSIS — R7989 Other specified abnormal findings of blood chemistry: Secondary | ICD-10-CM

## 2023-10-19 DIAGNOSIS — I21A1 Myocardial infarction type 2: Secondary | ICD-10-CM | POA: Diagnosis present

## 2023-10-19 DIAGNOSIS — I517 Cardiomegaly: Secondary | ICD-10-CM | POA: Diagnosis not present

## 2023-10-19 DIAGNOSIS — E66811 Obesity, class 1: Secondary | ICD-10-CM | POA: Diagnosis present

## 2023-10-19 DIAGNOSIS — J90 Pleural effusion, not elsewhere classified: Secondary | ICD-10-CM | POA: Diagnosis not present

## 2023-10-19 DIAGNOSIS — Z7901 Long term (current) use of anticoagulants: Secondary | ICD-10-CM

## 2023-10-19 DIAGNOSIS — Z7984 Long term (current) use of oral hypoglycemic drugs: Secondary | ICD-10-CM

## 2023-10-19 DIAGNOSIS — Z7982 Long term (current) use of aspirin: Secondary | ICD-10-CM

## 2023-10-19 DIAGNOSIS — I440 Atrioventricular block, first degree: Secondary | ICD-10-CM | POA: Diagnosis present

## 2023-10-19 DIAGNOSIS — I214 Non-ST elevation (NSTEMI) myocardial infarction: Principal | ICD-10-CM | POA: Diagnosis present

## 2023-10-19 DIAGNOSIS — Z952 Presence of prosthetic heart valve: Secondary | ICD-10-CM | POA: Diagnosis not present

## 2023-10-19 DIAGNOSIS — E871 Hypo-osmolality and hyponatremia: Secondary | ICD-10-CM | POA: Diagnosis present

## 2023-10-19 DIAGNOSIS — D509 Iron deficiency anemia, unspecified: Secondary | ICD-10-CM | POA: Diagnosis present

## 2023-10-19 DIAGNOSIS — I480 Paroxysmal atrial fibrillation: Secondary | ICD-10-CM | POA: Diagnosis not present

## 2023-10-19 DIAGNOSIS — I110 Hypertensive heart disease with heart failure: Secondary | ICD-10-CM | POA: Diagnosis not present

## 2023-10-19 DIAGNOSIS — I252 Old myocardial infarction: Secondary | ICD-10-CM | POA: Diagnosis present

## 2023-10-19 DIAGNOSIS — R079 Chest pain, unspecified: Principal | ICD-10-CM

## 2023-10-19 DIAGNOSIS — I161 Hypertensive emergency: Secondary | ICD-10-CM | POA: Diagnosis present

## 2023-10-19 DIAGNOSIS — F17210 Nicotine dependence, cigarettes, uncomplicated: Secondary | ICD-10-CM | POA: Diagnosis present

## 2023-10-19 DIAGNOSIS — I509 Heart failure, unspecified: Secondary | ICD-10-CM

## 2023-10-19 DIAGNOSIS — Z880 Allergy status to penicillin: Secondary | ICD-10-CM

## 2023-10-19 DIAGNOSIS — F419 Anxiety disorder, unspecified: Secondary | ICD-10-CM | POA: Diagnosis present

## 2023-10-19 DIAGNOSIS — Z5986 Financial insecurity: Secondary | ICD-10-CM

## 2023-10-19 DIAGNOSIS — Z6833 Body mass index (BMI) 33.0-33.9, adult: Secondary | ICD-10-CM

## 2023-10-19 DIAGNOSIS — I5043 Acute on chronic combined systolic (congestive) and diastolic (congestive) heart failure: Secondary | ICD-10-CM | POA: Diagnosis present

## 2023-10-19 DIAGNOSIS — J189 Pneumonia, unspecified organism: Secondary | ICD-10-CM | POA: Diagnosis present

## 2023-10-19 DIAGNOSIS — R0789 Other chest pain: Secondary | ICD-10-CM

## 2023-10-19 DIAGNOSIS — I5082 Biventricular heart failure: Secondary | ICD-10-CM | POA: Diagnosis present

## 2023-10-19 DIAGNOSIS — I5031 Acute diastolic (congestive) heart failure: Secondary | ICD-10-CM | POA: Diagnosis not present

## 2023-10-19 LAB — BASIC METABOLIC PANEL WITH GFR
Anion gap: 8 (ref 5–15)
BUN: 15 mg/dL (ref 6–20)
CO2: 26 mmol/L (ref 22–32)
Calcium: 8.7 mg/dL — ABNORMAL LOW (ref 8.9–10.3)
Chloride: 99 mmol/L (ref 98–111)
Creatinine, Ser: 0.71 mg/dL (ref 0.44–1.00)
GFR, Estimated: >60 mL/min (ref >60)
Glucose, Bld: 120 mg/dL — ABNORMAL HIGH (ref 70–99)
Potassium: 4 mmol/L (ref 3.5–5.1)
Sodium: 133 mmol/L — ABNORMAL LOW (ref 135–145)

## 2023-10-19 LAB — APTT: aPTT: 37 s — ABNORMAL HIGH (ref 24–36)

## 2023-10-19 LAB — TROPONIN I (HIGH SENSITIVITY)
Troponin I (High Sensitivity): 408 ng/L (ref <18)
Troponin I (High Sensitivity): 490 ng/L (ref <18)
Troponin I (High Sensitivity): 449 ng/L (ref <18)
Troponin I (High Sensitivity): 518 ng/L (ref <18)

## 2023-10-19 LAB — CBC
HCT: 30.4 % — ABNORMAL LOW (ref 36.0–46.0)
Hemoglobin: 9.6 g/dL — ABNORMAL LOW (ref 12.0–15.0)
MCH: 27.4 pg (ref 26.0–34.0)
MCHC: 31.6 g/dL (ref 30.0–36.0)
MCV: 86.9 fL (ref 80.0–100.0)
Platelets: 497 10*3/uL — ABNORMAL HIGH (ref 150–400)
RBC: 3.5 MIL/uL — ABNORMAL LOW (ref 3.87–5.11)
RDW: 15.9 % — ABNORMAL HIGH (ref 11.5–15.5)
WBC: 25 10*3/uL — ABNORMAL HIGH (ref 4.0–10.5)
nRBC: 0.3 % — ABNORMAL HIGH (ref 0.0–0.2)

## 2023-10-19 LAB — BRAIN NATRIURETIC PEPTIDE: B Natriuretic Peptide: 1629.6 pg/mL — ABNORMAL HIGH (ref 0.0–100.0)

## 2023-10-19 LAB — PROTIME-INR
INR: 2.2 — ABNORMAL HIGH (ref 0.8–1.2)
Prothrombin Time: 24.6 s — ABNORMAL HIGH (ref 11.4–15.2)

## 2023-10-19 LAB — HEPARIN LEVEL (UNFRACTIONATED): Heparin Unfractionated: <0.1 [IU]/mL — ABNORMAL LOW (ref 0.30–0.70)

## 2023-10-19 MED ORDER — ACETAMINOPHEN 325 MG PO TABS: 650.0000 mg | ORAL_TABLET | ORAL | Status: DC | PRN

## 2023-10-19 MED ORDER — SODIUM CHLORIDE 0.9 % IV SOLN
2.0000 g | INTRAVENOUS | Status: DC
  Administered 2023-10-19 – 2023-10-21 (×3): 2 g via INTRAVENOUS
  Filled 2023-10-19 (×5): qty 20

## 2023-10-19 MED ORDER — NITROGLYCERIN 0.4 MG SL SUBL: 0.4000 mg | SUBLINGUAL_TABLET | SUBLINGUAL | Status: DC | PRN

## 2023-10-19 MED ORDER — SODIUM CHLORIDE 0.9 % IV SOLN: 250.0000 mL | INTRAVENOUS | Status: AC | PRN

## 2023-10-19 MED ORDER — OXYCODONE HCL 5 MG PO TABS
5.0000 mg | ORAL_TABLET | ORAL | Status: DC | PRN
  Administered 2023-10-19: 5 mg via ORAL
  Filled 2023-10-19: qty 1

## 2023-10-19 MED ORDER — AMIODARONE HCL 200 MG PO TABS
400.0000 mg | ORAL_TABLET | Freq: Every day | ORAL | Status: DC
  Administered 2023-10-19: 400 mg via ORAL
  Filled 2023-10-19: qty 2

## 2023-10-19 MED ORDER — DULOXETINE HCL 20 MG PO CPEP
40.0000 mg | ORAL_CAPSULE | Freq: Every day | ORAL | Status: DC
  Administered 2023-10-19 – 2023-10-22 (×4): 40 mg via ORAL
  Filled 2023-10-19 (×4): qty 2

## 2023-10-19 MED ORDER — OXYCODONE HCL 5 MG PO TABS
10.0000 mg | ORAL_TABLET | ORAL | Status: DC | PRN
  Administered 2023-10-19 – 2023-10-22 (×9): 10 mg via ORAL
  Filled 2023-10-19 (×9): qty 2

## 2023-10-19 MED ORDER — AMIODARONE HCL 200 MG PO TABS: 200.0000 mg | ORAL_TABLET | Freq: Every day | ORAL | Status: DC

## 2023-10-19 MED ORDER — SODIUM CHLORIDE 0.9% FLUSH
3.0000 mL | Freq: Two times a day (BID) | INTRAVENOUS | Status: DC
  Administered 2023-10-19 – 2023-10-22 (×6): 3 mL via INTRAVENOUS

## 2023-10-19 MED ORDER — ASPIRIN 81 MG PO CHEW: 324.0000 mg | CHEWABLE_TABLET | Freq: Once | ORAL | Status: DC

## 2023-10-19 MED ORDER — HEPARIN BOLUS VIA INFUSION
2300.0000 [IU] | Freq: Once | INTRAVENOUS | Status: AC
  Administered 2023-10-19: 2300 [IU] via INTRAVENOUS
  Filled 2023-10-19: qty 2300

## 2023-10-19 MED ORDER — FUROSEMIDE 10 MG/ML IJ SOLN
40.0000 mg | Freq: Two times a day (BID) | INTRAMUSCULAR | Status: DC
  Administered 2023-10-19 – 2023-10-21 (×4): 40 mg via INTRAVENOUS
  Filled 2023-10-19 (×4): qty 4

## 2023-10-19 MED ORDER — WARFARIN SODIUM 2.5 MG PO TABS
2.5000 mg | ORAL_TABLET | Freq: Once | ORAL | Status: AC
  Administered 2023-10-19: 2.5 mg via ORAL
  Filled 2023-10-19: qty 1

## 2023-10-19 MED ORDER — ROSUVASTATIN CALCIUM 10 MG PO TABS
40.0000 mg | ORAL_TABLET | Freq: Every day | ORAL | Status: DC
  Administered 2023-10-19 – 2023-10-22 (×4): 40 mg via ORAL
  Filled 2023-10-19: qty 4
  Filled 2023-10-19 (×2): qty 2
  Filled 2023-10-19: qty 4

## 2023-10-19 MED ORDER — ONDANSETRON HCL 4 MG PO TABS: 4.0000 mg | ORAL_TABLET | Freq: Four times a day (QID) | ORAL | Status: DC | PRN

## 2023-10-19 MED ORDER — AMIODARONE HCL 200 MG PO TABS: 400.0000 mg | ORAL_TABLET | Freq: Every day | ORAL | Status: DC

## 2023-10-19 MED ORDER — ISOSORB DINITRATE-HYDRALAZINE 20-37.5 MG PO TABS
1.0000 | ORAL_TABLET | Freq: Three times a day (TID) | ORAL | Status: DC
  Administered 2023-10-19 – 2023-10-21 (×6): 1 via ORAL
  Filled 2023-10-19 (×8): qty 1

## 2023-10-19 MED ORDER — WARFARIN - PHARMACIST DOSING INPATIENT
Freq: Every day | Status: DC
  Filled 2023-10-19: qty 1

## 2023-10-19 MED ORDER — HEPARIN BOLUS VIA INFUSION
4000.0000 [IU] | Freq: Once | INTRAVENOUS | Status: AC
  Administered 2023-10-19: 4000 [IU] via INTRAVENOUS
  Filled 2023-10-19: qty 4000

## 2023-10-19 MED ORDER — FUROSEMIDE 40 MG PO TABS: 40.0000 mg | ORAL_TABLET | Freq: Every day | ORAL | Status: DC

## 2023-10-19 MED ORDER — ONDANSETRON HCL 4 MG/2ML IJ SOLN
4.0000 mg | Freq: Four times a day (QID) | INTRAMUSCULAR | Status: DC | PRN
  Administered 2023-10-22: 4 mg via INTRAVENOUS
  Filled 2023-10-19: qty 2

## 2023-10-19 MED ORDER — ACETAMINOPHEN 325 MG PO TABS: 650.0000 mg | ORAL_TABLET | Freq: Four times a day (QID) | ORAL | Status: DC | PRN

## 2023-10-19 MED ORDER — SODIUM CHLORIDE 0.9 % IV SOLN
500.0000 mg | INTRAVENOUS | Status: DC
  Administered 2023-10-19 – 2023-10-21 (×3): 500 mg via INTRAVENOUS
  Filled 2023-10-19 (×4): qty 5

## 2023-10-19 MED ORDER — ASPIRIN 81 MG PO CHEW
81.0000 mg | CHEWABLE_TABLET | Freq: Every day | ORAL | Status: DC
  Administered 2023-10-20 – 2023-10-22 (×3): 81 mg via ORAL
  Filled 2023-10-19 (×3): qty 1

## 2023-10-19 MED ORDER — FUROSEMIDE 10 MG/ML IJ SOLN
60.0000 mg | Freq: Once | INTRAMUSCULAR | Status: AC
  Administered 2023-10-19: 60 mg via INTRAVENOUS
  Filled 2023-10-19: qty 8

## 2023-10-19 MED ORDER — MORPHINE SULFATE (PF) 2 MG/ML IV SOLN
2.0000 mg | INTRAVENOUS | Status: DC | PRN
  Administered 2023-10-19 – 2023-10-22 (×9): 2 mg via INTRAVENOUS
  Filled 2023-10-19 (×9): qty 1

## 2023-10-19 MED ORDER — AMIODARONE HCL 200 MG PO TABS
400.0000 mg | ORAL_TABLET | Freq: Two times a day (BID) | ORAL | Status: DC
Start: 2023-10-19 — End: 2023-10-19

## 2023-10-19 MED ORDER — ASPIRIN 325 MG PO TABS
325.0000 mg | ORAL_TABLET | Freq: Once | ORAL | Status: AC
  Administered 2023-10-19: 325 mg via ORAL
  Filled 2023-10-19: qty 1

## 2023-10-19 MED ORDER — AMIODARONE HCL 200 MG PO TABS
200.0000 mg | ORAL_TABLET | Freq: Every day | ORAL | Status: DC
  Administered 2023-10-20 – 2023-10-22 (×3): 200 mg via ORAL
  Filled 2023-10-19 (×3): qty 1

## 2023-10-19 MED ORDER — SODIUM CHLORIDE 0.9% FLUSH: 3.0000 mL | INTRAVENOUS | Status: DC | PRN

## 2023-10-19 MED ORDER — HEPARIN (PORCINE) 25000 UT/250ML-% IV SOLN
1550.0000 [IU]/h | INTRAVENOUS | Status: DC
  Administered 2023-10-19: 1000 [IU]/h via INTRAVENOUS
  Filled 2023-10-19 (×2): qty 250

## 2023-10-19 NOTE — ED Notes (Signed)
 Lab called to assist in obtaining 2nd set of BC's.

## 2023-10-19 NOTE — Consult Note (Addendum)
 PHARMACY - ANTICOAGULATION CONSULT NOTE  Pharmacy Consult for Heparin/Warfarin  Indication: NSTEMI/Mechanical Mitral Valve Replacement/Atrial Fibrillation   Allergies  Allergen Reactions   Penicillins Hives    Has patient had a PCN reaction causing immediate rash, facial/tongue/throat swelling, SOB or lightheadedness with hypotension: No Has patient had a PCN reaction causing severe rash involving mucus membranes or skin necrosis: No Has patient had a PCN reaction that required hospitalization: No Has patient had a PCN reaction occurring within the last 10 years: No If all of the above answers are "NO", then may proceed with Cephalosporin use.    Patient Measurements: Height: 5\' 7"  (170.2 cm) Weight: 97.5 kg (215 lb) IBW/kg (Calculated) : 61.6 HEPARIN DW (KG): 83.2  Vital Signs: Temp: 97.8 F (36.6 C) (03/29 0744) BP: 143/83 (03/29 0744) Pulse Rate: 73 (03/29 0744)  Labs: Recent Labs    10/19/23 0741  HGB 9.6*  HCT 30.4*  PLT 497*  LABPROT 24.6*  INR 2.2*  CREATININE 0.71  TROPONINIHS 408*   Estimated Creatinine Clearance: 100.9 mL/min (by C-G formula based on SCr of 0.71 mg/dL).  Medical History: Past Medical History:  Diagnosis Date   Hypertension    Medications:  PTA Warfarin - recent new start for mechanical mitral valve replacement  Last dose: 2.5 mg on 3/28 AM  Home regimen: 2.5 mg daily for 2 days, then 5 mg daily - then repeat this 3 day schedule   Assessment: 53 year old female that presented from home with sharp chest pain that began yesterday. Elevated troponins in the 400s. Recently underwent a mitral valve replacement on 10/07/2023 and was initiated on warfarin on 10/09/2023. Patient took last 2.5 mg warfarin dose yesterday morning. Pharmacy has been consulted for initiation and management of heparin for an NSTEMI. Baseline labs: Hgb 9.6, PLT 497, INR 2.2, aPTT has been ordered.   Goal of Therapy:  Anti-Xa level 0.3-0.7 units/hr  INR goal: 2.5 - 3.5   Monitor platelets by anticoagulation protocol: Yes  Date INR Warfarin Dose  3/28 -- 2.5 mg   3/29 2.2  2.5 mg     Heparin Plan:  Give 4000 unit bolus x 1  Start heparin infusion at 1000 units/hr  Check HL 6 hours after initiation of infusion  Monitor CBC daily and for s/sx of bleeding   Addendum:  Pharmacy now consulted to resume warfarin patient's PTA warfarin  Plan to continue heparin infusion until INR is therapeutic per cardiology  DDI: amiodarone, can increase INR  INR subtherapeutic today at 2.2 - per patient, she took 2.5 mg warfarin yesterday morning  Give warfarin 2.5 mg x 1 tonight  Monitor INR daily   Littie Deeds, PharmD Pharmacy Resident  10/19/2023 8:43 AM

## 2023-10-19 NOTE — Assessment & Plan Note (Signed)
 Cont statin

## 2023-10-19 NOTE — ED Provider Notes (Signed)
 [facility] Provider Note    Event Date/Time   First MD Initiated Contact with Patient 10/19/23 [phone]     (approximate)   History   Chest Pain   HPI  Sandra Hines is a 53 y.o. female status post recent mitral valve replacement presents to the ER for evaluation shortness of breath cough chest pain.  Patient is on Coumadin.  States that her symptoms got worse over the past 24 hours.  She weighed herself and she typically is around [id number] but this morning was 214.  Not currently on any diuretics.  She denies any pleuritic pain.  No fevers or chills.  No abdominal pain.  She did run out of her pain medications.     Physical Exam   Triage Vital Signs: ED Triage Vitals [10/19/23 0733]  Encounter Vitals Group     BP      Systolic BP Percentile      Diastolic BP Percentile      Pulse      Resp      Temp      Temp src      SpO2      Weight 215 lb (97.5 kg)     Height 5\' 7"  (1.702 m)     Head Circumference      Peak Flow      Pain Score 9     Pain Loc      Pain Education      Exclude from Growth Chart     Most recent vital signs: Vitals:   10/19/23 0744 10/19/23 0848  BP: (!) 143/83 (!) 147/86  Pulse: 73 68  Resp: 15 19  Temp: 97.8 F (36.6 C)   SpO2: 100% 100%     Constitutional: Alert  Eyes: Conjunctivae are normal.  Head: Atraumatic. Nose: No congestion/rhinnorhea. Mouth/Throat: Mucous membranes are moist.   Neck: Painless ROM.  Cardiovascular:   Good peripheral circulation. Respiratory: Mild tachypnea but speaking in complete phrases.  Inspiratory crackles posteriorly. Gastrointestinal: Soft and nontender.  Musculoskeletal: 2+ bilateral lower extremity pitting edema.. Neurologic:  MAE spontaneously. No gross focal neurologic deficits are appreciated.  Skin:  Skin is warm, dry and intact. No rash noted. Psychiatric: Mood and affect are normal. Speech and behavior are normal.    ED Results / Procedures / Treatments   Labs (all  labs ordered are listed, but only abnormal results are displayed) Labs Reviewed  BASIC METABOLIC PANEL WITH GFR - Abnormal; Notable for the following components:      Result Value   Sodium 133 (*)    Glucose, Bld 120 (*)    Calcium 8.7 (*)    All other components within normal limits  CBC - Abnormal; Notable for the following components:   WBC 25.0 (*)    RBC 3.50 (*)    Hemoglobin 9.6 (*)    HCT 30.4 (*)    RDW 15.9 (*)    Platelets 497 (*)    nRBC 0.3 (*)    All other components within normal limits  PROTIME-INR - Abnormal; Notable for the following components:   Prothrombin Time 24.6 (*)    INR 2.2 (*)    All other components within normal limits  TROPONIN I (HIGH SENSITIVITY) - Abnormal; Notable for the following components:   Troponin I (High Sensitivity) 408 (*)    All other components within normal limits  BRAIN NATRIURETIC PEPTIDE  POC URINE PREG, ED  TROPONIN I (HIGH SENSITIVITY)  EKG  ED ECG REPORT I, Willy Eddy, the attending physician, personally viewed and interpreted this ECG.   Date: 10/19/2023  EKG Time: 7:35  Rate: 75  Rhythm: sinus  Axis: right  Intervals: normal  ST&T Change: nonspecific st abn, no stemi    RADIOLOGY Please see ED Course for my review and interpretation.  I personally reviewed all radiographic images ordered to evaluate for the above acute complaints and reviewed radiology reports and findings.  These findings were personally discussed with the patient.  Please see medical record for radiology report.    PROCEDURES:  Critical Care performed: No  Procedures   MEDICATIONS ORDERED IN ED: Medications  oxyCODONE (Oxy IR/ROXICODONE) immediate release tablet 5 mg (5 mg Oral Given 10/19/23 0809)  furosemide (LASIX) injection 60 mg (60 mg Intravenous Given 10/19/23 0846)     IMPRESSION / MDM / ASSESSMENT AND PLAN / ED COURSE  I reviewed the triage vital signs and the nursing notes.                               Differential diagnosis includes, but is not limited to,  Asthma, copd, CHF, pna, ptx, malignancy, Pe, anemia  Patient presenting to the ER for evaluation of symptoms as described above.  Based on symptoms, risk factors and considered above differential, this presenting complaint could reflect a potentially life-threatening illness therefore the patient will be placed on continuous pulse oximetry and telemetry for monitoring.  Laboratory evaluation will be sent to evaluate for the above complaints.      Clinical Course as of 10/19/23 1610  Sat Oct 19, 2023  0759 Chest x-ray on my review and interpret of cardiomegaly.  No pneumothorax noted. [PR]  0815 Based on her exam findings I do have a higher suspicion for some mild CHF.  She is not hypoxic.  Will give her p.o. pain check renal function BNP troponin.  Have a lower suspicion for PE as she is anticoagulated.  Lower suspicion for significant pericardial effusion. [PR]  0907 I discussed the case in consultation with Dr. Cristal Deer of cardiology.  Agrees with plan for IV diuresis and admission for IV diuresis, echocardiogram and further monitoring.  Her troponin is elevated but I think this is more likely mild CHF as opposed to ACS.  I have ordered IV Lasix.  I have consulted pharmacy for assistance with INR management.  Will consult hospitalist for admission. [PR]    Clinical Course User Index [PR] Willy Eddy, MD     FINAL CLINICAL IMPRESSION(S) / ED DIAGNOSES   Final diagnoses:  Chest pain, unspecified type  Acute pulmonary edema (HCC)     Rx / DC Orders   ED Discharge Orders     None        Note:  This document was prepared using Dragon voice recognition software and may include unintentional dictation errors.    Willy Eddy, MD 10/19/23 [phone]

## 2023-10-19 NOTE — Consult Note (Signed)
 PHARMACY - ANTICOAGULATION CONSULT NOTE  Pharmacy Consult for Heparin/Warfarin  Indication: NSTEMI/Mechanical Mitral Valve Replacement/Atrial Fibrillation   Allergies  Allergen Reactions   Penicillins Hives    Has patient had a PCN reaction causing immediate rash, facial/tongue/throat swelling, SOB or lightheadedness with hypotension: No Has patient had a PCN reaction causing severe rash involving mucus membranes or skin necrosis: No Has patient had a PCN reaction that required hospitalization: No Has patient had a PCN reaction occurring within the last 10 years: No If all of the above answers are "NO", then may proceed with Cephalosporin use.    Patient Measurements: Height: 5\' 7"  (170.2 cm) Weight: 97.5 kg (215 lb) IBW/kg (Calculated) : 61.6 HEPARIN DW (KG): 83.2  Vital Signs: Temp: 98 F (36.7 C) (03/29 1730) Temp Source: Oral (03/29 1730) BP: 131/78 (03/29 1900) Pulse Rate: 75 (03/29 1900)  Labs: Recent Labs    10/19/23 0741 10/19/23 0926 10/19/23 1108 10/19/23 1147 10/19/23 1309 10/19/23 1827  HGB 9.6*  --   --   --   --   --   HCT 30.4*  --   --   --   --   --   PLT 497*  --   --   --   --   --   APTT  --   --   --  37*  --   --   LABPROT 24.6*  --   --   --   --   --   INR 2.2*  --   --   --   --   --   HEPARINUNFRC  --   --   --   --   --  <0.10*  CREATININE 0.71  --   --   --   --   --   TROPONINIHS 408* 490* 449*  --  518*  --    Estimated Creatinine Clearance: 100.9 mL/min (by C-G formula based on SCr of 0.71 mg/dL).  Medical History: Past Medical History:  Diagnosis Date   Hypertension    Medications:  PTA Warfarin - recent new start for mechanical mitral valve replacement  Last dose: 2.5 mg on 3/28 AM  Home regimen: 2.5 mg daily for 2 days, then 5 mg daily - then repeat this 3 day schedule   Assessment: 53 year old female that presented from home with sharp chest pain that began yesterday. Elevated troponins in the 400s. Recently underwent a  mitral valve replacement on 10/07/2023 and was initiated on warfarin on 10/09/2023. Patient took last 2.5 mg warfarin dose yesterday morning. Pharmacy has been consulted for initiation and management of heparin for an NSTEMI. Baseline labs: Hgb 9.6, PLT 497, INR 2.2, aPTT has been ordered.   03/29 1827  HL < 0.10,   1000 units/hr, SUBtherapeutic  Goal of Therapy:  Anti-Xa level 0.3-0.7 units/hr  INR goal: 2.5 - 3.5  Monitor platelets by anticoagulation protocol: Yes  Date INR Warfarin Dose  3/28 -- 2.5 mg   3/29 2.2  2.5 mg     Heparin Plan:  - HL is SUBtherapeutic - Heparin bolus of 2300 units  - Increase Heparin drip to 1250 units/hr - Next HL in 6 hours - Daily CBC   Addendum:  Pharmacy now consulted to resume warfarin patient's PTA warfarin  Plan to continue heparin infusion until INR is therapeutic per cardiology  DDI: amiodarone, can increase INR  INR subtherapeutic today at 2.2 - per patient, she took 2.5 mg warfarin yesterday morning  Give warfarin 2.5 mg x 1 tonight  Monitor INR daily   Clovia Cuff, PharmD, BCPS 10/19/2023 8:16 PM

## 2023-10-19 NOTE — ED Notes (Signed)
 Transfer of care report called to [facility].  Discussed Pt Dx, Hx, Sx, current condition, lines, labs, imaging, medications, and plan of care.  All questions asked were answered.

## 2023-10-19 NOTE — ED Notes (Signed)
 Pt has heparin going and needs a 2nd line for other meds and blood draws.. IV team consult was placed. IV team said patient doesn't need a second line.

## 2023-10-19 NOTE — Assessment & Plan Note (Signed)
 New onset diagnosis of atrial fibrillation during March 13 of March 24 admission on the cardiology service EKG sinus rhythm with first-degree block on evaluation today Continue home amiodarone Monitor

## 2023-10-19 NOTE — Assessment & Plan Note (Addendum)
 Status post mitral valve replacement March 17 On coumadin-transitioned to heparin gtt in setting of NSTEMI  Follow up cardiology recommendations

## 2023-10-19 NOTE — Assessment & Plan Note (Signed)
BP stable Monitor with diuresis

## 2023-10-19 NOTE — Assessment & Plan Note (Addendum)
 Acute dyspnea, increased work of breathing orthopnea PND x 24 hours in the setting of a recent mitral valve replacement 2D echo earlier this month with a EF of 50 to 55% BNP 700-->1600 Chest x-ray with bilateral opacities with concern for edema versus infection- will get CT chest to better assess  Positive Rales on exam Roughly 7 pound weight gain since March 17 mitral valve replacement Status post 60 mg of IV Lasix in the ER Will continue with diuresis Cardiology consulted Follow-up recommendations

## 2023-10-19 NOTE — Assessment & Plan Note (Signed)
 Troponin 400s in the setting of acute CHF exacerbation EKG stable Will transition from Coumadin to heparin drip in setting of recent mitral valve replacement Trend troponin 2D echo Follow-up cardiology recommendations

## 2023-10-19 NOTE — Consult Note (Addendum)
 Cardiology Consultation   Patient ID: NYIA TSAO MRN: [id number]; DOB: 1971-06-16  Admit date: 10/19/2023 Date of Consult: 10/19/2023  PCP:  Ronnald Ramp, MD   [facility] Providers Cardiologist:  Julien Nordmann, MD        Patient Profile:   Sandra Hines is a 53 y.o. 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  History of Present Illness:     Sandra Hines who previously presented to [facility] emergency department 09/25/2023 with acute onset of palpitations, shortness of breath, generalized weakness and malaise, and cough with upper respiratory symptoms not improving.  She was found to be hypertensive emergency with a blood pressure of 211/154 in atrial fibrillation with RVR.  EKG revealed suspected atrial fibrillation with rate of 184 with repolarization abnormality.  Chest x-ray showed mild cardiomegaly with mild central pulmonary vascular congestion.  Cardiology was consulted.  She was placed on Cardizem drip initially but due to lack of response was placed on amiodarone.  She was given bisoprolol and amlodipine for blood pressure control.  She was restarted on rosuvastatin after lipid panel was completed.  She did require BiPAP briefly then high flow nasal cannula.  She was treated for possible bronchitis and early pneumonia.  She was diuresed with furosemide.  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  She was placed on a heparin drip for atrial fibrillation.  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  She converted to sinus rhythm and was transition to oral amiodarone.  Bisoprolol twice daily was continued.  Cardiac catheterization was done 10/03/2023 showed no significant atherosclerotic coronary artery disease.  She was transferred to [facility] for further evaluation and treatment on 10/03/2023.  Cardiothoracic surgery (Dr. Leafy Ro) was consulted for consideration of mitral valve repair/replacement.  Dr. Leafy Ro discussed the need for median sternotomy for mitral valve replacement using mechanical mitral valve, PVI MAZE, and consideration of replacement of ascending thoracic aorta.  She underwent median sternotomy for mechanical mitral valve replacement and PVI maze.  Aortic valve dilation was not severe enough to warrant repair.  Following procedure she was separated from cardiopulmonary bypass without difficulty and was transferred to the ICU in stable condition.  She was extubated early in the afternoon of surgery.  She did have underlying sinus with bigeminy after surgery.  She also had A-fib with RVR on admission and was not initially treated with amiodarone or Lopressor postoperatively because of sinus arrest/atrial bigeminy.  Advanced heart failure followed her postop and diuresed her with IV Lasix and she was started on BiDil half a tablet 3 times daily.  On 318 she was not stable to be advanced to full GDMT but was recommended to continue to escalate it on an outpatient basis.  She was started on low-dose warfarin with PT and INR monitored daily.  Unfortunately she  was not started on Lovenox postoperatively due to thrombocytopenia.  She had to be started on oral amiodarone 200 mg twice  daily for the atrial fibrillation.  EP was asked to evaluate as she had paroxysms of atrial fibrillation and bradycardia when she converted.  Bradycardia events subsided over time and it was not felt that she would require pacemaker placement.  She had received a course of diuretics during the hospitalization but was thought that ongoing diuresis at discharge was not needed.  She was started on IV ceftriaxone for possible bronchitis due to leukocytosis and cough.  She was felt stable for discharge out of the ICU.  She continued to make ongoing improvements in her physical recovery.  Incisions are noted to be healing well without evidence of infection.  Oxygen has been weaned and she was maintaining saturations on room air.  INR was therapeutic at 2.7.  So she was considered stable was able to be discharged from the facility on 10/14/2023.   She presented to [facility] emergency department today from home with midsternal chest pain that started yesterday that she reports is sharp/stabbing,  shortness of breath, increased swelling, and weight gain.  She stated that her symptoms worsened over the last 24 hours.  She was not discharged on any diuretic therapy even though she required diuresis during the hospitalization.  She stated that she called the clinic and asked for Lasix and but unfortunately was advised that was not on her medication profile so a new prescription of Lasix would not be able to be sent in for her.  She states she has had worsening chest discomfort as her shortness of breath is worsening she has been unable to lie in the bed flat due to coughing and no wheezing.  She states that with her weight she has gained approximately 7 pounds over the last several days.  She says with worsening shortness of breath she has had decreased appetite and has not been eating as usual.  Initial vital signs: Blood pressure 143/83, pulse was 73, respirations of 15, temperature of 97.8  Pertinent labs: Sodium 133, blood  glucose 120, calcium 8.7, hemoglobin of 9.6, hematocrit of 30.4, INR 2.2, high-sensitivity troponin of 408,490, 449,  BNP 1629.6  Imaging: Chest x-ray revealed persistent left basilar opacity, atelectasis versus pneumonia, probable small left pleural effusion, mild right perihilar interstitial opacity which may reflect edema versus infection; CT of the chest pending  Medications administered in the emergency department: Furosemide 60 mg IV and oxycodone 5 mg  Cardiology was consulted for acute on chronic heart failure exacerbation   Past Medical History:  Diagnosis Date   Hypertension     Past Surgical History:  Procedure Laterality Date   CLIPPING OF ATRIAL APPENDAGE  10/07/2023   Procedure: CLIPPING, LEFT ATRIAL APPENDAGE USING MEDTRONIC PENDITURE LAA EXCLUSION SYSTEM SIZE ;  Surgeon: Eugenio Hoes, MD;  Location[facility] OR;  Service: Open Heart Surgery;;   INTRAOPERATIVE TRANSESOPHAGEAL ECHOCARDIOGRAM N/A 10/07/2023   Procedure: ECHOCARDIOGRAM, TRANSESOPHAGEAL, INTRAOPERATIVE;  Surgeon: Eugenio Hoes, MD;  Location: [facility] OR;  Service: Open Heart Surgery;  Laterality: N/A;   MAZE N/A 10/07/2023   Procedure: MAZE PROCEDURE;  Surgeon: Eugenio Hoes, MD;  Location: [facility] OR;  Service: Open Heart Surgery;  Laterality: N/A;   MITRAL VALVE REPLACEMENT N/A 10/07/2023   Procedure: MITRAL VALVE REPLACEMENT USING SJM MASTERS SERIES MECHANICAL MITRAL HEART VALVE SIZE ;  Surgeon: Eugenio Hoes, MD;  Location: [facility] OR;  Service: Open Heart Surgery;  Laterality: N/A;   RIGHT/LEFT  HEART CATH AND CORONARY ANGIOGRAPHY N/A 10/03/2023   Procedure: RIGHT/LEFT HEART CATH AND CORONARY ANGIOGRAPHY;  Surgeon: Yvonne Kendall, MD;  Location: [facility] INVASIVE CV LAB;  Service: Cardiovascular;  Laterality: N/A;   TEE WITHOUT CARDIOVERSION N/A 10/02/2023   Procedure: ECHOCARDIOGRAM, TRANSESOPHAGEAL;  Surgeon: Antonieta Iba, MD;  Location: [facility] ORS;  Service: Cardiovascular;  Laterality: N/A;     Home Medications:   Prior to Admission medications   Medication Sig Start Date End Date Taking? Authorizing Provider  acetaminophen (TYLENOL) 325 MG tablet Take 2 tablets (650 mg total) by mouth every 4 (four) hours as needed for headache or mild pain (pain score 1-3). 10/03/23  Yes Loyce Dys, MD  amiodarone (PACERONE) 200 MG tablet Take 2 tablets (400 mg total) by mouth 2 (two) times daily. For 5 days, then 400 mg daily for 5 days, then 200 mg daily 10/14/23  Yes Gold, Wayne E, PA-C  aspirin 81 MG chewable tablet Chew 1 tablet (81 mg total) by mouth daily. 10/14/23  Yes Gold, Wayne E, PA-C  busPIRone (BUSPAR) 5 MG tablet Take 1 tablet (5 mg total) by mouth 3 (three) times daily. 07/19/23  Yes Jacky Kindle, FNP  DULoxetine (CYMBALTA) 20 MG capsule Take 2 capsules (40 mg total) by mouth daily. 08/15/23  Yes Simmons-Robinson, Makiera, MD  Fe Fum-Vit C-Vit B12-FA (TRIGELS-F FORTE) CAPS capsule Take 1 capsule by mouth daily. 10/14/23   Gold, Wayne E, PA-C  ipratropium-albuterol (DUONEB) 0.5-2.5 (3) MG/3ML SOLN Take 3 mLs by nebulization every 2 (two) hours as needed (shortness of breath). 10/14/23   Gold, Wayne E, PA-C  isosorbide-hydrALAZINE (BIDIL) 20-37.5 MG tablet Take 0.5 tablets by mouth 3 (three) times daily. Patient not taking: Reported on 10/19/2023 10/14/23   Gershon Crane E, PA-C  magnesium hydroxide (MILK OF MAGNESIA) 400 MG/5ML suspension Take 30 mLs by mouth daily as needed for mild constipation or moderate constipation. 10/03/23   Loyce Dys, MD  oxyCODONE (OXY IR/ROXICODONE) 5 MG immediate release tablet Take 1 tablet (5 mg total) by mouth every 6 (six) hours as needed for up to 7 days for severe pain (pain score 7-10). 10/14/23 10/21/23  Loreli Slot, MD  rosuvastatin (CRESTOR) 40 MG tablet Take 1 tablet (40 mg total) by mouth daily. 07/19/23  Yes Merita Norton T, FNP  warfarin (COUMADIN) 2.5 MG tablet Take 1 tablet (2.5 mg total) by mouth as directed. Alternating schedule every 3 days, take 2.5 mg  daily for 2 days, then 5 mg daily- then repeat 3 day cycle. Then per coumadin clinic 10/14/23  Yes Rowe Clack, PA-C    Inpatient Medications: Scheduled Meds:  furosemide  40 mg Oral Daily   sodium chloride flush  3 mL Intravenous Q12H   Continuous Infusions:  sodium chloride     PRN Meds: sodium chloride, acetaminophen, ondansetron **OR** ondansetron (ZOFRAN) IV, oxyCODONE, sodium chloride flush  Allergies:    Allergies  Allergen Reactions   Penicillins Hives    Has patient had a PCN reaction causing immediate rash, facial/tongue/throat swelling, SOB or lightheadedness with hypotension: No Has patient had a PCN reaction causing severe rash involving mucus membranes or skin necrosis: No Has patient had a PCN reaction that required hospitalization: No Has patient had a PCN reaction occurring within the last 10 years: No If all of the above answers are "NO", then may proceed with Cephalosporin use.     Social History:   Social History   Socioeconomic History   Marital status: Single  Spouse name: Not on file   Number of children: 8   Years of education: Not on file   Highest education level: Some college, no degree  Occupational History   Not on file  Tobacco Use   Smoking status: Every Day    Current packs/day: 1.00    Types: Cigarettes   Smokeless tobacco: Never  Vaping Use   Vaping status: Never Used  Substance and Sexual Activity   Alcohol use: No   Drug use: No   Sexual activity: Yes    Birth control/protection: None, Condom  Other Topics Concern   Not on file  Social History Narrative   Not on file   Social Drivers of Health   Financial Resource Strain: Medium Risk (09/30/2023)   Overall Financial Resource Strain (CARDIA)    Difficulty of Paying Living Expenses: Somewhat hard  Food Insecurity: No Food Insecurity (10/03/2023)   Hunger Vital Sign    Worried About Running Out of Food in the Last Year: Never true    Ran Out of Food in the Last Year:  Never true  Transportation Needs: No Transportation Needs (10/03/2023)   PRAPARE - Administrator, Civil Service (Medical): No    Lack of Transportation (Non-Medical): No  Physical Activity: Not on file  Stress: Not on file  Social Connections: Moderately Integrated (10/03/2023)   Social Connection and Isolation Panel [NHANES]    Frequency of Communication with Friends and Family: Three times a week    Frequency of Social Gatherings with Friends and Family: Once a week    Attends Religious Services: 1 to 4 times per year    Active Member of [employer] or Organizations: Yes    Attends Banker Meetings: 1 to 4 times per year    Marital Status: Never married  Intimate Partner Violence: Not At Risk (10/03/2023)   Humiliation, Afraid, Rape, and Kick questionnaire    Fear of Current or Ex-Partner: No    Emotionally Abused: No    Physically Abused: No    Sexually Abused: No    Family History:   History reviewed. No pertinent family history.   ROS:  Please see the history of present illness.  Review of Systems  Constitutional:  Positive for malaise/fatigue.  Respiratory:  Positive for cough, sputum production, shortness of breath and wheezing.   Cardiovascular:  Positive for chest pain, orthopnea, leg swelling and PND.  Neurological:  Positive for weakness.  Psychiatric/Behavioral:  The patient is nervous/anxious.     All other ROS reviewed and negative.     Physical Exam/Data:   Vitals:   10/19/23 0744 10/19/23 0848 10/19/23 0900 10/19/23 1117  BP: (!) 143/83 (!) 147/86 (!) 151/102 128/86  Pulse: 73 68 70 75  Resp: 15 19 13  (!) 25  Temp: 97.8 F (36.6 C)     SpO2: 100% 100% 99% 98%  Weight:      Height:       No intake or output data in the 24 hours ending 10/19/23 1139    10/19/2023    7:33 AM 10/14/2023    3:40 AM 10/13/2023    5:50 AM  Last 3 Weights  Weight (lbs) 215 lb 206 lb 9.1 oz 206 lb 4.8 oz  Weight (kg) 97.523 kg 93.7 kg 93.577 kg     Body  mass index is 33.67 kg/m.  General:  Well nourished, well developed, in no acute distress HEENT: normal Neck: + JVD Vascular: No carotid bruits; Distal pulses 2+  bilaterally Cardiac:  normal S1, S2; RRR; no murmur, audible click from mechanical valve Lungs:  coarse with scattered rhonchi throughout to auscultation bilaterally, mild dyspnea in tachypnea with speaking Abd: soft, nontender, obese, no hepatomegaly  Ext: 1+ edema BLE Musculoskeletal:  No deformities, BUE and BLE strength normal and equal Skin: warm and dry  Neuro:  CNs 2-12 intact, no focal abnormalities noted Psych:  Normal affect   EKG:  The EKG was personally reviewed and demonstrates: Sinus rhythm with rate of 75, first Grapey block, LVH, left bundle branch block, ST depression T wave inversion in lateral leads Telemetry:  Telemetry was personally reviewed and demonstrates: Sinus rhythm with rate of 68 with first-degree AV block  Relevant CV Studies: LHC 10/03/2023 Conclusions: No angiographically significant atherosclerotic coronary artery disease.  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 Moderately elevated left heart, right heart, and pulmonary artery pressures.  Large v-waves observed on PCWP tracing consistent with the patient's severe mitral regurgitation. Low normal to mildly reduced Fick cardiac output/index.   Recommendations: Escalate diuresis and consider consultation with advanced heart failure team for optimization of acute diastolic heart failure in the setting of severe mitral valve regurgitation. Cardiac surgery consultation for mitral valve replacement.  Response to medical therapy will dictate if this can be done as an outpatient or if the patient will need transfer to [facility] for inpatient evaluation. Resume heparin infusion 2 hours after TR band removal. Primary prevention of coronary artery disease.   TEE 10/02/2023 1. Left ventricular ejection fraction, by estimation, is 55 to 60%. The   left ventricle has normal function. The left ventricle has no regional  wall motion abnormalities.   2. Right ventricular systolic function is low normal. The right  ventricular size is normal.   3. The mitral valve is rheumatic, both anterior and posterior leaflets  involved. Severe mitral valve regurgitation, central and eccentric jet  noted. No evidence of mitral stenosis.   4. The aortic valve is normal in structure. Aortic valve regurgitation is  not visualized. No aortic stenosis is present.   5. The inferior vena cava is normal in size with greater than 50%  respiratory variability, suggesting right atrial pressure of 3 mmHg.   6. No left atrial/left atrial appendage thrombus was detected.   7. 3D performed of the mitral valve and demonstrates rheumatic mitral  valve.   2D limited echo 10/01/2023 1. Left ventricular ejection fraction, by estimation, is 55 to 60%. The  left ventricle has normal function. The left ventricular internal cavity  size was severely dilated. There is mild left ventricular hypertrophy.   2. Left atrial size was severely dilated.   3. 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 No  evidence of mitral stenosis.   4. The aortic valve is tricuspid. Aortic valve regurgitation is trivial.   5. There is normal pulmonary artery systolic pressure.   6. The inferior vena cava is normal in size with greater than 50%  respiratory variability, suggesting right atrial pressure of 3 mmHg.   2D echo 09/26/2023 1. Left ventricular ejection fraction, by estimation, is 45 to 50%. The  left ventricle has mildly decreased function. The left ventricle  demonstrates global hypokinesis. The left ventricular internal cavity size  was mildly dilated. There is mild left  ventricular hypertrophy. Left ventricular diastolic parameters are  indeterminate.   2. Right ventricular systolic function is mildly reduced. The right  ventricular size is normal. There  is normal pulmonary artery  systolic  pressure. The estimated right ventricular systolic pressure is 25.8 mmHg.   3. Left atrial size was severely dilated.   4. 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 No  evidence of mitral stenosis. The mean mitral valve gradient is 11.0 mmHg.  Moderate mitral annular calcification.   5. Tricuspid valve regurgitation is mild to moderate.   6. The aortic valve is tricuspid. Aortic valve regurgitation is mild. No  aortic stenosis is present.   7. There is borderline dilatation of the ascending aorta, measuring 38  mm.   8. The inferior vena cava is normal in size with greater than 50%  respiratory variability, suggesting right atrial pressure of 3 mmHg.   Laboratory Data:  High Sensitivity Troponin:   Recent Labs  Lab 09/25/23 1711 09/27/23 1326 09/27/23 1440 10/19/23 0741 10/19/23 0926  TROPONINIHS 16 16 16  408* 490*     Chemistry Recent Labs  Lab 10/13/23 0311 10/14/23 0348 10/19/23 0741  NA 129* 128* 133*  K 3.9 3.9 4.0  CL 91* 92* 99  CO2 29 30 26   GLUCOSE 121* 93 120*  BUN 14 10 15   CREATININE 0.83 0.89 0.71  CALCIUM 8.4* 8.0* 8.7*  GFRNONAA >60 >60 >60  ANIONGAP 9 6 8     No results for input(s): "PROT", "ALBUMIN", "AST", "ALT", "ALKPHOS", "BILITOT" in the last 168 hours. Lipids No results for input(s): "CHOL", "TRIG", "HDL", "LABVLDL", "LDLCALC", "CHOLHDL" in the last 168 hours.  Hematology Recent Labs  Lab 10/13/23 0848 10/19/23 0741  WBC 16.3* 25.0*  RBC 3.24* 3.50*  HGB 9.0* 9.6*  HCT 27.9* 30.4*  MCV 86.1 86.9  MCH 27.8 27.4  MCHC 32.3 31.6  RDW 15.4 15.9*  PLT 320 497*   Thyroid No results for input(s): "TSH", "FREET4" in the last 168 hours.  BNP Recent Labs  Lab 10/19/23 0741  BNP 1,629.6*    DDimer No results for input(s): "DDIMER" in the last 168 hours.   Radiology/Studies:  DG Chest Port 1 View Result Date: 10/19/2023 CLINICAL DATA:  [id number] Chest pain [id number] EXAM: PORTABLE  CHEST 1 VIEW COMPARISON:  10/11/2023 FINDINGS: Stable cardiomegaly status post sternotomy, mitral valve replacement, and left atrial appendage clipping. Persistent left basilar opacity. Probable small left pleural effusion. Band-like atelectasis in the left mid lung. Mild right perihilar interstitial opacity. No right-sided pleural effusion. No pneumothorax. IMPRESSION: 1. Persistent left basilar opacity, atelectasis versus pneumonia. Probable small left pleural effusion. 2. Mild right perihilar interstitial opacity, which may reflect edema versus infection. Electronically Signed   By: Duanne Guess D.O.   On: 10/19/2023 08:32     Assessment and Plan:   Acute on chronic HFmrEF exacerbation -Presented with worsening shortness of breath, peripheral edema, weight gain -BNP elevated at 1629.6 -Previously was treated with diuretics prior to discharge from the hospital but was not discharged on diuretic therapy -Given 60 mg of IV furosemide in the emergency department -Starting 40 mg IV twice daily -Restarting bidil 20/37.5 mg 3 times daily -Echocardiogram ordered and pending with further recommendations to follow -Continue to escalate GDMT as tolerated while diuresing -Continue with heart failure education -Daily weights and I's and O's  Atypical chest pain with elevated high-sensitivity troponin -No new ischemic changes noted on EKG -Patient states chest discomfort as a sharp stabbing type discomfort that is worsened with her shortness of breath likely exacerbated by acute heart failure -Recently given pain medications -Increased anxiety -Recent left heart catheterization completed in 09/2023 revealed no angiographically significant atherosclerotic coronary  artery disease -Continue on aspirin 81 mg daily and rosuvastatin 40 mg daily -EKG as needed for changes -High-sensitivity troponins trended 408, 490, and 449 -Trended flat -Likely demand ischemia from hypertension and heart failure  exacerbation -No current plans for invasive ischemic evaluation at this time  Severe mitral regurgitation status post MVR -Status post MVR with mechanical valve on 10/07/2023 -Continue on warfarin -Echocardiogram ordered and pending with further recommendations to follow  Paroxysmal atrial fibrillation/sinus bradycardia/LBBB -Currently maintaining sinus rhythm -Continue amiodarone 200 mg daily -Continue warfarin for CHA2DS2-VASc score of at least... For stroke prophylaxis -Continue with telemetry monitoring  Hypertension -Blood pressure 128/86 -Continue BiDil 20/37.5 mg 3 times daily -Vital signs per unit protocol  Hyperlipidemia -Continue rosuvastatin 40 mg daily  7.   Anxiety -Recommend restarting antianxiety medications -Ongoing management per IM  Risk Assessment/Risk Scores:        New York Heart Association (NYHA) Functional Class NYHA Class III  CHA2DS2-VASc Score =     This indicates a  % annual risk of stroke. The patient's score is based upon:           For questions or updates, please contact  [facility] Please consult [email or web address] for contact info under    Signed, Orest Dygert, NP  10/19/2023 11:39 AM

## 2023-10-19 NOTE — Assessment & Plan Note (Deleted)
 Acute dyspnea, increased work of breathing orthopnea PND x 24 hours in the setting of a recent mitral valve replacement BNP 700-->1600 Chest x-ray with bilateral opacities with concern for edema versus infection Positive Rales on exam Roughly 7 pound weight gain since March 17 mitral valve replacement Status post 60 mg of IV Lasix in the ER Will continue with diuresis Cardiology consulted Follow-up recommendations

## 2023-10-19 NOTE — Assessment & Plan Note (Signed)
 Patient reports quitting smoking multiple months ago

## 2023-10-19 NOTE — Progress Notes (Signed)
 CT chest concerning for ? PNA  Will add on rocephin and azithromycin for infectious coverage  Blood and resp cultures  Monitor

## 2023-10-19 NOTE — Assessment & Plan Note (Signed)
 White count 25 on presentation Suspect multifactorial with recent mitral valve placement Chest x-ray with concern for edema versus infection Will check CT of the chest to better assess Escalate to pneumonia coverage if imaging indicative given overall presentation Will also add on blood cultures Monitor

## 2023-10-19 NOTE — H&P (Signed)
 History and Physical    Patient: Sandra Hines DOB: [birth year] DOA: 10/19/2023 DOS: the patient was seen and examined on 10/19/2023 PCP: Ronnald Ramp, MD  Patient coming from: Home  Chief Complaint:  Chief Complaint  Patient presents with   Chest Pain   HPI: Sandra Hines is a 53 y.o. 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  Patient noted to have been admitted March 13 of March 24 for issues including severe mitral regurgitation.  Please see discharge summary for full details.  Had mitral valve replacement as well as PVI maze.  Also had episode of new onset atrial fibrillation.  Was discharged on Coumadin as well as amiodarone.  Patient reports worsening orthopnea, PND lower extremity swelling over the past 4 to 5 days.  Former smoker.  Quit smoking many months ago.  Minimal chest pain.  No abdominal pain.  No nausea or vomiting.  Worsening lower extremity swelling.  Denies any high salt or NSAID intake.  States she was not discharged on diuretic. Presented to the ER afebrile, hemodynamically stable.  Satting well on room air.  White count 25, hemoglobin 9.6, platelets 197, troponin 408 to 4 90-4 49.  Creatinine 0.71.  INR 2.2.  BNP 1630.  CT chest with left basilar opacity with atelectasis versus pneumonia.  Mild right perihilar interstitial opacity concerning for edema versus infection. Review of Systems: As mentioned in the history of present illness. All other systems reviewed and are negative. Past Medical History:  Diagnosis Date   Hypertension    Past Surgical History:  Procedure Laterality Date   CLIPPING OF ATRIAL APPENDAGE  10/07/2023   Procedure:  CLIPPING, LEFT ATRIAL APPENDAGE USING MEDTRONIC PENDITURE LAA EXCLUSION SYSTEM SIZE ;  Surgeon: Eugenio Hoes, MD;  Location: [facility] OR;  Service: Open Heart Surgery;;   INTRAOPERATIVE TRANSESOPHAGEAL ECHOCARDIOGRAM N/A 10/07/2023   Procedure: ECHOCARDIOGRAM, TRANSESOPHAGEAL, INTRAOPERATIVE;  Surgeon: Eugenio Hoes, MD;  Location: [facility] OR;  Service: Open Heart Surgery;  Laterality: N/A;   MAZE N/A 10/07/2023   Procedure: MAZE PROCEDURE;  Surgeon: Eugenio Hoes, MD;  Location: [facility] OR;  Service: Open Heart Surgery;  Laterality: N/A;   MITRAL VALVE REPLACEMENT N/A 10/07/2023   Procedure: MITRAL VALVE REPLACEMENT USING SJM MASTERS SERIES MECHANICAL MITRAL HEART VALVE SIZE ;  Surgeon: Eugenio Hoes, MD;  Location: [facility] OR;  Service: Open Heart Surgery;  Laterality: N/A;   RIGHT/LEFT HEART CATH AND CORONARY ANGIOGRAPHY N/A 10/03/2023   Procedure: RIGHT/LEFT HEART CATH AND CORONARY ANGIOGRAPHY;  Surgeon: Yvonne Kendall, MD;  Location: [facility] INVASIVE CV LAB;  Service: Cardiovascular;  Laterality: N/A;   TEE WITHOUT CARDIOVERSION N/A 10/02/2023   Procedure: ECHOCARDIOGRAM, TRANSESOPHAGEAL;  Surgeon: Antonieta Iba, MD;  Location: [facility] ORS;  Service: Cardiovascular;  Laterality: N/A;   Social History:  reports that she has been smoking cigarettes. She has never used smokeless tobacco. She reports that she does not drink alcohol and does not use drugs.  Allergies  Allergen Reactions   Penicillins Hives    Has patient had a PCN reaction causing immediate rash, facial/tongue/throat swelling, SOB or lightheadedness with hypotension: No Has patient had a PCN reaction causing severe rash involving mucus membranes or skin necrosis: No Has patient had a PCN reaction that required hospitalization: No Has patient had a PCN reaction occurring within  the last 10 years: No If all of the above answers are "NO", then may proceed with Cephalosporin use.     History reviewed. No pertinent family history.  Prior to  Admission medications   Medication Sig Start Date End Date Taking? Authorizing Provider  acetaminophen (TYLENOL) 325 MG tablet Take 2 tablets (650 mg total) by mouth every 4 (four) hours as needed for headache or mild pain (pain score 1-3). 10/03/23  Yes Loyce Dys, MD  amiodarone (PACERONE) 200 MG tablet Take 2 tablets (400 mg total) by mouth 2 (two) times daily. For 5 days, then 400 mg daily for 5 days, then 200 mg daily 10/14/23  Yes Gold, Wayne E, PA-C  aspirin 81 MG chewable tablet Chew 1 tablet (81 mg total) by mouth daily. 10/14/23  Yes Gold, Wayne E, PA-C  busPIRone (BUSPAR) 5 MG tablet Take 1 tablet (5 mg total) by mouth 3 (three) times daily. 07/19/23  Yes Jacky Kindle, FNP  DULoxetine (CYMBALTA) 20 MG capsule Take 2 capsules (40 mg total) by mouth daily. 08/15/23  Yes Simmons-Robinson, Makiera, MD  rosuvastatin (CRESTOR) 40 MG tablet Take 1 tablet (40 mg total) by mouth daily. 07/19/23  Yes Merita Norton T, FNP  warfarin (COUMADIN) 2.5 MG tablet Take 1 tablet (2.5 mg total) by mouth as directed. Alternating schedule every 3 days, take 2.5 mg daily for 2 days, then 5 mg daily- then repeat 3 day cycle. Then per coumadin clinic 10/14/23  Yes Gold, Wayne E, PA-C  Fe Fum-Vit C-Vit B12-FA (TRIGELS-F FORTE) CAPS capsule Take 1 capsule by mouth daily. 10/14/23   Gold, Wayne E, PA-C  ipratropium-albuterol (DUONEB) 0.5-2.5 (3) MG/3ML SOLN Take 3 mLs by nebulization every 2 (two) hours as needed (shortness of breath). 10/14/23   Gold, Wayne E, PA-C  isosorbide-hydrALAZINE (BIDIL) 20-37.5 MG tablet Take 0.5 tablets by mouth 3 (three) times daily. Patient not taking: Reported on 10/19/2023 10/14/23   Gershon Crane E, PA-C  magnesium hydroxide (MILK OF MAGNESIA) 400 MG/5ML suspension Take 30 mLs by mouth daily as needed for mild constipation or moderate constipation. 10/03/23   Loyce Dys, MD  oxyCODONE (OXY IR/ROXICODONE) 5 MG immediate release tablet Take 1 tablet (5 mg total) by mouth every 6 (six) hours  as needed for up to 7 days for severe pain (pain score 7-10). 10/14/23 10/21/23  Loreli Slot, MD    Physical Exam: Vitals:   10/19/23 0744 10/19/23 0848 10/19/23 0900 10/19/23 1117  BP: (!) 143/83 (!) 147/86 (!) 151/102 128/86  Pulse: 73 68 70 75  Resp: 15 19 13  (!) 25  Temp: 97.8 F (36.6 C)     SpO2: 100% 100% 99% 98%  Weight:      Height:       Physical Exam Constitutional:      Appearance: She is normal weight.  HENT:     Head: Normocephalic and atraumatic.     Nose: Nose normal.     Mouth/Throat:     Mouth: Mucous membranes are moist.  Eyes:     Pupils: Pupils are equal, round, and reactive to light.  Cardiovascular:     Rate and Rhythm: Normal rate and regular rhythm.  Pulmonary:     Effort: Pulmonary effort is normal.     Breath sounds: Rales present.  Abdominal:     General: Bowel sounds are normal.  Musculoskeletal:     Right lower leg: Edema present.     Left lower leg: Edema present.  Skin:  General: Skin is warm.  Neurological:     General: No focal deficit present.  Psychiatric:        Mood and Affect: Mood normal.     Data Reviewed:  There are no new results to review at this time.  DG Chest Port 1 View CLINICAL DATA:  [id number] Chest pain [id number]  EXAM: PORTABLE CHEST 1 VIEW  COMPARISON:  10/11/2023  FINDINGS: Stable cardiomegaly status post sternotomy, mitral valve replacement, and left atrial appendage clipping. Persistent left basilar opacity. Probable small left pleural effusion. Band-like atelectasis in the left mid lung. Mild right perihilar interstitial opacity. No right-sided pleural effusion. No pneumothorax.  IMPRESSION: 1. Persistent left basilar opacity, atelectasis versus pneumonia. Probable small left pleural effusion. 2. Mild right perihilar interstitial opacity, which may reflect edema versus infection.  Electronically Signed   By: Duanne Guess D.O.   On: 10/19/2023 08:32  Lab Results  Component Value  Date   WBC 25.0 (H) 10/19/2023   HGB 9.6 (L) 10/19/2023   HCT 30.4 (L) 10/19/2023   MCV 86.9 10/19/2023   PLT 497 (H) 10/19/2023   Last metabolic panel Lab Results  Component Value Date   GLUCOSE 120 (H) 10/19/2023   NA 133 (L) 10/19/2023   K 4.0 10/19/2023   CL 99 10/19/2023   CO2 26 10/19/2023   BUN 15 10/19/2023   CREATININE 0.71 10/19/2023   GFRNONAA >60 10/19/2023   CALCIUM 8.7 (L) 10/19/2023   PHOS 3.4 10/03/2023   PROT 6.9 09/30/2023   ALBUMIN 2.9 (L) 10/03/2023   LABGLOB 2.7 07/19/2023   BILITOT 0.6 09/30/2023   ALKPHOS 116 09/30/2023   AST 29 09/30/2023   ALT 81 (H) 09/30/2023   ANIONGAP 8 10/19/2023    Assessment and Plan: * NSTEMI (non-ST elevated myocardial infarction) (HCC) Troponin 400s in the setting of acute CHF exacerbation EKG stable Will transition from Coumadin to heparin drip in setting of recent mitral valve replacement Trend troponin 2D echo Follow-up cardiology recommendations  Acute on chronic heart failure with preserved ejection fraction (HFpEF) (HCC) Acute dyspnea, increased work of breathing orthopnea PND x 24 hours in the setting of a recent mitral valve replacement 2D echo earlier this month with a EF of 50 to 55% BNP 700-->1600 Chest x-ray with bilateral opacities with concern for edema versus infection- will get CT chest to better assess  Positive Rales on exam Roughly 7 pound weight gain since March 17 mitral valve replacement Status post 60 mg of IV Lasix in the ER Will continue with diuresis Cardiology consulted Follow-up recommendations  Atrial fibrillation (HCC) New onset diagnosis of atrial fibrillation during March 13 of March 24 admission on the cardiology service EKG sinus rhythm with first-degree block on evaluation today Continue home amiodarone Monitor  Leukocytosis White count 25 on presentation Suspect multifactorial with recent mitral valve placement Chest x-ray with concern for edema versus infection Will  check CT of the chest to better assess Escalate to pneumonia coverage if imaging indicative given overall presentation Will also add on blood cultures Monitor  Severe mitral valve regurgitation Status post mitral valve replacement March 17 On coumadin-transitioned to heparin gtt in setting of NSTEMI  Follow up cardiology recommendations   Dyslipidemia Cont statin    Primary hypertension BP stable  Monitor with diuresis    Moderate tobacco dependence Patient reports quitting smoking multiple months ago      Advance Care Planning:   Code Status: Full Code   Consults: Cardiology   Family Communication: No  family at the bedside   Severity of Illness: The appropriate patient status for this patient is OBSERVATION. Observation status is judged to be reasonable and necessary in order to provide the required intensity of service to ensure the patient's safety. The patient's presenting symptoms, physical exam findings, and initial radiographic and laboratory data in the context of their medical condition is felt to place them at decreased risk for further clinical deterioration. Furthermore, it is anticipated that the patient will be medically stable for discharge from the hospital within 2 midnights of admission.   Author: Floydene Flock, MD 10/19/2023 12:08 PM  For on call review [email or web address].

## 2023-10-19 NOTE — ED Triage Notes (Addendum)
 Pt via POV from home. Pt c/o mid-chest pain started yesterday. Reports pain is sharp. Pt also states she is SOB and increasing in swelling. Pt has an extensive cardiac hx. Pt recently had heart surgery on last Monday in [place]. Pt takes Warfarin. Pt is A&OX4 and NAD, ambulatory to triage.

## 2023-10-19 NOTE — ED Notes (Signed)
 Lab at bedside

## 2023-10-20 DIAGNOSIS — I480 Paroxysmal atrial fibrillation: Secondary | ICD-10-CM

## 2023-10-20 DIAGNOSIS — I5031 Acute diastolic (congestive) heart failure: Secondary | ICD-10-CM

## 2023-10-20 DIAGNOSIS — I10 Essential (primary) hypertension: Secondary | ICD-10-CM | POA: Diagnosis not present

## 2023-10-20 DIAGNOSIS — Z79899 Other long term (current) drug therapy: Secondary | ICD-10-CM | POA: Diagnosis not present

## 2023-10-20 DIAGNOSIS — J189 Pneumonia, unspecified organism: Secondary | ICD-10-CM | POA: Diagnosis not present

## 2023-10-20 DIAGNOSIS — I21A1 Myocardial infarction type 2: Secondary | ICD-10-CM | POA: Diagnosis not present

## 2023-10-20 DIAGNOSIS — E785 Hyperlipidemia, unspecified: Secondary | ICD-10-CM

## 2023-10-20 DIAGNOSIS — D696 Thrombocytopenia, unspecified: Secondary | ICD-10-CM | POA: Diagnosis not present

## 2023-10-20 DIAGNOSIS — Z952 Presence of prosthetic heart valve: Secondary | ICD-10-CM | POA: Diagnosis not present

## 2023-10-20 DIAGNOSIS — Z7982 Long term (current) use of aspirin: Secondary | ICD-10-CM | POA: Diagnosis not present

## 2023-10-20 DIAGNOSIS — F419 Anxiety disorder, unspecified: Secondary | ICD-10-CM | POA: Diagnosis not present

## 2023-10-20 DIAGNOSIS — F17210 Nicotine dependence, cigarettes, uncomplicated: Secondary | ICD-10-CM | POA: Diagnosis not present

## 2023-10-20 DIAGNOSIS — I447 Left bundle-branch block, unspecified: Secondary | ICD-10-CM | POA: Diagnosis not present

## 2023-10-20 DIAGNOSIS — R0789 Other chest pain: Secondary | ICD-10-CM | POA: Diagnosis not present

## 2023-10-20 DIAGNOSIS — D72829 Elevated white blood cell count, unspecified: Secondary | ICD-10-CM | POA: Diagnosis not present

## 2023-10-20 DIAGNOSIS — I161 Hypertensive emergency: Secondary | ICD-10-CM | POA: Diagnosis not present

## 2023-10-20 DIAGNOSIS — I5082 Biventricular heart failure: Secondary | ICD-10-CM | POA: Diagnosis not present

## 2023-10-20 DIAGNOSIS — I214 Non-ST elevation (NSTEMI) myocardial infarction: Secondary | ICD-10-CM | POA: Diagnosis not present

## 2023-10-20 DIAGNOSIS — E66811 Obesity, class 1: Secondary | ICD-10-CM | POA: Diagnosis not present

## 2023-10-20 DIAGNOSIS — Z6833 Body mass index (BMI) 33.0-33.9, adult: Secondary | ICD-10-CM | POA: Diagnosis not present

## 2023-10-20 DIAGNOSIS — R7989 Other specified abnormal findings of blood chemistry: Secondary | ICD-10-CM | POA: Diagnosis not present

## 2023-10-20 DIAGNOSIS — E871 Hypo-osmolality and hyponatremia: Secondary | ICD-10-CM | POA: Diagnosis not present

## 2023-10-20 DIAGNOSIS — J810 Acute pulmonary edema: Secondary | ICD-10-CM | POA: Diagnosis not present

## 2023-10-20 DIAGNOSIS — D509 Iron deficiency anemia, unspecified: Secondary | ICD-10-CM | POA: Diagnosis not present

## 2023-10-20 DIAGNOSIS — I110 Hypertensive heart disease with heart failure: Secondary | ICD-10-CM | POA: Diagnosis not present

## 2023-10-20 DIAGNOSIS — Z7901 Long term (current) use of anticoagulants: Secondary | ICD-10-CM | POA: Diagnosis not present

## 2023-10-20 DIAGNOSIS — E875 Hyperkalemia: Secondary | ICD-10-CM | POA: Diagnosis not present

## 2023-10-20 DIAGNOSIS — Z7984 Long term (current) use of oral hypoglycemic drugs: Secondary | ICD-10-CM | POA: Diagnosis not present

## 2023-10-20 DIAGNOSIS — I5043 Acute on chronic combined systolic (congestive) and diastolic (congestive) heart failure: Secondary | ICD-10-CM | POA: Diagnosis not present

## 2023-10-20 DIAGNOSIS — I5033 Acute on chronic diastolic (congestive) heart failure: Secondary | ICD-10-CM | POA: Diagnosis not present

## 2023-10-20 DIAGNOSIS — Z880 Allergy status to penicillin: Secondary | ICD-10-CM | POA: Diagnosis not present

## 2023-10-20 DIAGNOSIS — F39 Unspecified mood [affective] disorder: Secondary | ICD-10-CM | POA: Diagnosis not present

## 2023-10-20 DIAGNOSIS — I5021 Acute systolic (congestive) heart failure: Secondary | ICD-10-CM | POA: Diagnosis not present

## 2023-10-20 LAB — COMPREHENSIVE METABOLIC PANEL WITH GFR
ALT: 65 U/L — ABNORMAL HIGH (ref 0–44)
AST: 30 U/L (ref 15–41)
Albumin: 2.6 g/dL — ABNORMAL LOW (ref 3.5–5.0)
Alkaline Phosphatase: 87 U/L (ref 38–126)
Anion gap: 12 (ref 5–15)
BUN: 16 mg/dL (ref 6–20)
CO2: 26 mmol/L (ref 22–32)
Calcium: 7.9 mg/dL — ABNORMAL LOW (ref 8.9–10.3)
Chloride: 96 mmol/L — ABNORMAL LOW (ref 98–111)
Creatinine, Ser: 0.79 mg/dL (ref 0.44–1.00)
GFR, Estimated: >60 mL/min (ref >60)
Glucose, Bld: 80 mg/dL (ref 70–99)
Potassium: 3.7 mmol/L (ref 3.5–5.1)
Sodium: 134 mmol/L — ABNORMAL LOW (ref 135–145)
Total Bilirubin: 0.3 mg/dL (ref 0.0–1.2)
Total Protein: 6.3 g/dL — ABNORMAL LOW (ref 6.5–8.1)

## 2023-10-20 LAB — RESPIRATORY PANEL BY PCR

## 2023-10-20 LAB — CBC
HCT: 28.7 % — ABNORMAL LOW (ref 36.0–46.0)
Hemoglobin: 9 g/dL — ABNORMAL LOW (ref 12.0–15.0)
MCH: 27.3 pg (ref 26.0–34.0)
MCHC: 31.4 g/dL (ref 30.0–36.0)
MCV: 87 fL (ref 80.0–100.0)
Platelets: 427 10*3/uL — ABNORMAL HIGH (ref 150–400)
RBC: 3.3 MIL/uL — ABNORMAL LOW (ref 3.87–5.11)
RDW: 15.9 % — ABNORMAL HIGH (ref 11.5–15.5)
WBC: 20.7 10*3/uL — ABNORMAL HIGH (ref 4.0–10.5)
nRBC: 0.3 % — ABNORMAL HIGH (ref 0.0–0.2)

## 2023-10-20 LAB — PROTIME-INR
INR: 2.4 — ABNORMAL HIGH (ref 0.8–1.2)
Prothrombin Time: 26.3 s — ABNORMAL HIGH (ref 11.4–15.2)

## 2023-10-20 LAB — STREP PNEUMONIAE URINARY ANTIGEN: Strep Pneumo Urinary Antigen: NEGATIVE

## 2023-10-20 LAB — TROPONIN I (HIGH SENSITIVITY): Troponin I (High Sensitivity): 505 ng/L (ref <18)

## 2023-10-20 LAB — HEPARIN LEVEL (UNFRACTIONATED): Heparin Unfractionated: <0.1 [IU]/mL — ABNORMAL LOW (ref 0.30–0.70)

## 2023-10-20 LAB — HIV ANTIBODY (ROUTINE TESTING W REFLEX): HIV Screen 4th Generation wRfx: NONREACTIVE

## 2023-10-20 MED ORDER — HEPARIN BOLUS VIA INFUSION
2500.0000 [IU] | Freq: Once | INTRAVENOUS | Status: AC
  Administered 2023-10-20: 2500 [IU] via INTRAVENOUS
  Filled 2023-10-20: qty 2500

## 2023-10-20 MED ORDER — WARFARIN SODIUM 3 MG PO TABS
3.0000 mg | ORAL_TABLET | Freq: Once | ORAL | Status: AC
  Administered 2023-10-20: 3 mg via ORAL
  Filled 2023-10-20: qty 1

## 2023-10-20 MED ORDER — ENOXAPARIN SODIUM 100 MG/ML IJ SOSY
1.0000 mg/kg | PREFILLED_SYRINGE | Freq: Two times a day (BID) | INTRAMUSCULAR | Status: DC
  Administered 2023-10-20 – 2023-10-21 (×4): 97.5 mg via SUBCUTANEOUS
  Filled 2023-10-20 (×6): qty 1

## 2023-10-20 NOTE — Consult Note (Signed)
 PHARMACY - ANTICOAGULATION CONSULT NOTE  Pharmacy Consult for Heparin/Warfarin  Indication: NSTEMI/Mechanical Mitral Valve Replacement/Atrial Fibrillation   Allergies  Allergen Reactions   Penicillins Hives    Has patient had a PCN reaction causing immediate rash, facial/tongue/throat swelling, SOB or lightheadedness with hypotension: No Has patient had a PCN reaction causing severe rash involving mucus membranes or skin necrosis: No Has patient had a PCN reaction that required hospitalization: No Has patient had a PCN reaction occurring within the last 10 years: No If all of the above answers are "NO", then may proceed with Cephalosporin use.    Patient Measurements: Height: 5\' 7"  (170.2 cm) Weight: 97.5 kg (215 lb) IBW/kg (Calculated) : 61.6 HEPARIN DW (KG): 83.2  Vital Signs: Temp: 97.7 F (36.5 C) (03/30 0003) Temp Source: Oral (03/30 0003) BP: 127/79 (03/29 2253) Pulse Rate: 76 (03/30 0306)  Labs: Recent Labs    10/19/23 0741 10/19/23 0926 10/19/23 1108 10/19/23 1147 10/19/23 1309 10/19/23 1827 10/20/23 0216  HGB 9.6*  --   --   --   --   --  9.0*  HCT 30.4*  --   --   --   --   --  28.7*  PLT 497*  --   --   --   --   --  427*  APTT  --   --   --  37*  --   --   --   LABPROT 24.6*  --   --   --   --   --  26.3*  INR 2.2*  --   --   --   --   --  2.4*  HEPARINUNFRC  --   --   --   --   --  <0.10* <0.10*  CREATININE 0.71  --   --   --   --   --  0.79  TROPONINIHS 408* 490* 449*  --  518*  --   --    Estimated Creatinine Clearance: 100.9 mL/min (by C-G formula based on SCr of 0.79 mg/dL).  Medical History: Past Medical History:  Diagnosis Date   Hypertension    Medications:  PTA Warfarin - recent new start for mechanical mitral valve replacement  Last dose: 2.5 mg on 3/28 AM  Home regimen: 2.5 mg daily for 2 days, then 5 mg daily - then repeat this 3 day schedule   Assessment: 53 year old female that presented from home with sharp chest pain that began  yesterday. Elevated troponins in the 400s. Recently underwent a mitral valve replacement on 10/07/2023 and was initiated on warfarin on 10/09/2023. Patient took last 2.5 mg warfarin dose yesterday morning. Pharmacy has been consulted for initiation and management of heparin for an NSTEMI. Baseline labs: Hgb 9.6, PLT 497, INR 2.2, aPTT has been ordered.   03/29 1827  HL < 0.10,   1000 units/hr, SUBtherapeutic 03/30 0216  HL < 0.10,   1250 units/hr, SUBtherapeutic  Goal of Therapy:  Anti-Xa level 0.3-0.7 units/hr  INR goal: 2.5 - 3.5  Monitor platelets by anticoagulation protocol: Yes  Date INR Warfarin Dose  3/28 -- 2.5 mg   3/29 2.2  2.5 mg     Heparin Plan:  3/30:  HL @ 0216 = < 0.1, SUBtherapeutic  - Will order heparin 2500 units IV X 1 bolus and increase drip rate to 1550 units/hr - Will recheck HL 6 hrs after rate change   Addendum:  Pharmacy now consulted to resume warfarin patient's PTA  warfarin  Plan to continue heparin infusion until INR is therapeutic per cardiology  DDI: amiodarone, can increase INR  INR subtherapeutic today at 2.2 - per patient, she took 2.5 mg warfarin yesterday morning  Give warfarin 2.5 mg x 1 tonight  Monitor INR daily   Sandra Hines D 10/20/2023 3:57 AM

## 2023-10-20 NOTE — Progress Notes (Signed)
 Rounding Note    Patient Name: Sandra Hines Date of Encounter: 10/20/2023  [facility] Cardiologist: Julien Nordmann, MD   Subjective   Feels that she is making good urine, breathing slightly better. Did not feel afib overnight. Has had some intermittent sharp chest pain, managed well with pain meds.  Inpatient Medications    Scheduled Meds:  amiodarone  200 mg Oral Daily   aspirin  81 mg Oral Daily   DULoxetine  40 mg Oral Daily   enoxaparin (LOVENOX) injection  1 mg/kg Subcutaneous Q12H   furosemide  40 mg Intravenous BID   isosorbide-hydrALAZINE  1 tablet Oral TID   rosuvastatin  40 mg Oral Daily   sodium chloride flush  3 mL Intravenous Q12H   warfarin  3 mg Oral ONCE-1600   Warfarin - Pharmacist Dosing Inpatient   Does not apply q1600   Continuous Infusions:  azithromycin Stopped (10/19/23 2238)   cefTRIAXone (ROCEPHIN)  IV Stopped (10/19/23 2212)   PRN Meds: acetaminophen, morphine injection, nitroGLYCERIN, ondansetron **OR** ondansetron (ZOFRAN) IV, oxyCODONE, sodium chloride flush   Vital Signs    Vitals:   10/20/23 1130 10/20/23 1200 10/20/23 1230 10/20/23 1239  BP: (!) 123/111 132/85 (!) 148/82   Pulse: 78 73 76   Resp: 20 (!) 26 (!) 21   Temp:    98 F (36.7 C)  TempSrc:      SpO2: 96% 99% 97%   Weight:      Height:       No intake or output data in the 24 hours ending 10/20/23 1339    10/19/2023    7:33 AM 10/14/2023    3:40 AM 10/13/2023    5:50 AM  Last 3 Weights  Weight (lbs) 215 lb 206 lb 9.1 oz 206 lb 4.8 oz  Weight (kg) 97.523 kg 93.7 kg 93.577 kg      Telemetry    About 15 minutes of afib around 4 AM, otherwise sinus - Personally Reviewed  Physical Exam   GEN: No acute distress.   Neck: JVD just above clavicle at 30 degrees Cardiac: RRR, no murmurs, rubs, or gallops. Crisp mechanical click Respiratory: Less coarse than yesterday, R base improved, still diminished L base GI: Soft, nontender, non-distended  MS: No edema;  No deformity. Neuro:  Nonfocal  Psych: Normal affect   New pertinent results (labs, ECG, imaging, cardiac studies)     Patient Profile     53 y.o. female with recent CT surgery for mechanical MVR due to mitral regurgitation, MAZE, LAA occlusion at [facility] 10/07/23 by Dr. Leafy Ro. She was discharged on 10/14/23. Readmitted for acute on chronic diastolic heart failure.  Assessment & Plan    Acute on chronic diastolic heart failure -most recent TTE and TEE with EF 55-60%. Initial EF had been 45-50% on presentation -will repeat echo now postop, still pending -elevated BNP, JVD, edema, weight gain, orthopnea all support volume overload -continue diuresis with IV lasix. Renal function stable today. May be able to transition to oral tomorrow. Would send home with at least short course of oral diuretic to avoid recurrent volume overload. Has follow up next week with multiple providers to reassess volume. -she has appts on 4/2 with coumadin clinic and surgeon's office, if she is improved would try to discharge her in time to make these appts -no beta blocker given prior post conversion pauses and sinus arrest -continue bidil. Had hyperkalemia, will need to be cautious if ARNI considered -L pleural effusion, but  will try to manage with diuresis -groundglass opacities. Infectious workup per primary team -I/O, weights not documented in ER.    Chest pain -elevated troponins not consistent with ACS -no coronary disease on preop cath   MR s/p mechanical MVR -continue aspirin for mechanical valve -continue coumadin, INR subtherapeutic. On lovenox until INR at goal 2.5-3.5   Paroxysmal atrial fibrillation, s/p MAZE and LAA occlusion 10/07/23 -needs to be on coumadin long term for mechanical valve -currently in sinus rhythm -on amiodarone given post op afib, continue   Hyperlipidemia: continue rosuvastatin     Signed, Jodelle Red, MD  10/20/2023, 1:39 PM

## 2023-10-20 NOTE — Consult Note (Signed)
 PHARMACY - ANTICOAGULATION CONSULT NOTE  Pharmacy Consult for Enoxaparin/Warfarin  Indication: Mechanical Mitral Valve Replacement/Atrial Fibrillation   Allergies  Allergen Reactions   Penicillins Hives    Has patient had a PCN reaction causing immediate rash, facial/tongue/throat swelling, SOB or lightheadedness with hypotension: No Has patient had a PCN reaction causing severe rash involving mucus membranes or skin necrosis: No Has patient had a PCN reaction that required hospitalization: No Has patient had a PCN reaction occurring within the last 10 years: No If all of the above answers are "NO", then may proceed with Cephalosporin use.    Patient Measurements: Height: 5\' 7"  (170.2 cm) Weight: 97.5 kg (215 lb) IBW/kg (Calculated) : 61.6 HEPARIN DW (KG): 83.2  Vital Signs: Temp: 97.8 F (36.6 C) (03/30 0858) Temp Source: Oral (03/30 0858) BP: 156/102 (03/30 0903) Pulse Rate: 80 (03/30 0903)  Labs: Recent Labs    10/19/23 0741 10/19/23 0926 10/19/23 1108 10/19/23 1147 10/19/23 1309 10/19/23 1827 10/20/23 0216 10/20/23 0426  HGB 9.6*  --   --   --   --   --  9.0*  --   HCT 30.4*  --   --   --   --   --  28.7*  --   PLT 497*  --   --   --   --   --  427*  --   APTT  --   --   --  37*  --   --   --   --   LABPROT 24.6*  --   --   --   --   --  26.3*  --   INR 2.2*  --   --   --   --   --  2.4*  --   HEPARINUNFRC  --   --   --   --   --  <0.10* <0.10*  --   CREATININE 0.71  --   --   --   --   --  0.79  --   TROPONINIHS 408*   < > 449*  --  518*  --   --  505*   < > = values in this interval not displayed.   Estimated Creatinine Clearance: 100.9 mL/min (by C-G formula based on SCr of 0.79 mg/dL).  Medical History: Past Medical History:  Diagnosis Date   Hypertension    Medications:  PTA Warfarin - recent new start for mechanical mitral valve replacement  Last dose: 2.5 mg on 3/28 AM  Home regimen: 2.5 mg daily for 2 days, then 5 mg daily - then repeat this 3  day schedule   Assessment: 53 year old female that presented from home with sharp chest pain that began yesterday. Elevated troponins in the 400s. Recently underwent a mitral valve replacement on 10/07/2023 and was initiated on warfarin on 10/09/2023. Patient took last 2.5 mg warfarin dose yesterday morning. Pharmacy has been consulted for initiation and management of warfarin for MVR and afib.    Goal of Therapy:  INR goal: 2.5 - 3.5  Monitor platelets by anticoagulation protocol: Yes  Date INR Warfarin Dose  3/28 -- 2.5 mg   3/29 2.2  2.5 mg   3/30 2.4 3 mg     Warfarin plan:  INR slightly subtherapeutic. Will give warfarin 3 mg x 1 tonight (~20% increase from home dose). Daily INR. CBC at least every 3 days. Heparin infusion switched to enoxaparin 1mg /kg Bid. Continue enoxaparin bridge until INR > 2.5 x  24 hours.     Ronnald Ramp, PharmD, BCPS 10/20/2023 9:25 AM

## 2023-10-20 NOTE — ED Notes (Signed)
 Pt is requesting pain meds

## 2023-10-20 NOTE — ED Notes (Signed)
 Assumed patient care at approximately 0715 and received report from the previous nurse.

## 2023-10-20 NOTE — Progress Notes (Addendum)
 Progress Note   Patient: Sandra Hines:[id number] DOB: [birth year] DOA: 10/19/2023     0 DOS: the patient was seen and examined on 10/20/2023   Brief hospital course: Sandra Hines is a 53 y.o. 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 Patient is not on Lasix at home.  Admitted to [facility] service for NSTEMI, acute on chronic diastolic heart failure.   Assessment and Plan: * NSTEMI (non-ST elevated myocardial infarction) (HCC) Troponin 500s in the setting of acute CHF exacerbation EKG unremarkable.  Stop heparin drip. Continue Coumadin with Lovenox in setting of recent mitral valve replacement. INR 2.4. discussed with pharmacy. Trend troponin. Low suspicion for ACS given pre op cath 10/03/23 negative.  2D echo pending. Cardiology follow up appreciated.  Acute on chronic heart failure with preserved ejection fraction (HFpEF) (HCC) Acute dyspnea, orthopnea PND, positive weight gain in the setting of a recent mitral valve replacement 2D echo earlier this month with a EF of 50 to 55% BNP high at 1600. Chest x-ray with bilateral opacities with concern for edema versus infection. CT chest possible bilateral peripheral ground glass opacities. Left pleural effusion. Status post 60 mg of IV Lasix in the ER. Continue with diuresis iv Lasix 40mg  bid. Cardiology recommendations appreciated. Echo  pending.  Atrial fibrillation (HCC) New onset diagnosis of atrial fibrillation during March 13 of March 24 admission on the cardiology service. EKG sinus rhythm with first-degree block on evaluation today Continue home amiodarone, anticoagulation with Coumadin+lovenox bridging.  Leukocytosis White count 25 on presentation. Suspect multifactorial with recent mitral valve placement, recent pneumonia prior admission. CT chest shows  bilateral peripheral ground glass opacities.  Continue Rocephin and Azithromycin. Monitor daily CBC.  Hyponatremia- Stable, chronic.  Severe mitral valve regurgitation Status post mitral valve replacement March 17. Continue coumadin-with Lovenox bridging.  Cardiology recommended post op Echo pending.  Dyslipidemia Cont statin   Primary hypertension BP stable  Continue Bidil.  Obesity class 1- BMI 33.67 Diet, weight reduction advised.     Out of bed to chair. Incentive spirometry. Nursing supportive care. Fall, aspiration precautions. Diet:  Diet Orders (From admission, onward)     Start     Ordered   10/19/23 1057  Diet heart healthy/carb modified Room service appropriate? Yes; Fluid consistency: Thin; Fluid restriction: 1200 mL Fluid  Diet effective now       Question Answer Comment  Diet-HS Snack? Nothing   Room service appropriate? Yes   Fluid consistency: Thin   Fluid restriction: 1200 mL Fluid      10/19/23 1057           DVT prophylaxis:  warfarin (COUMADIN) tablet 3 mg  Level of care: Telemetry Cardiac   Code Status: Full Code  Subjective: Patient is seen and examined today morning. She is sitting on edge of bed. Mild short of breath. Chest pain more with breathing difficulty. Wished to go home, understands she need IV diuresis.  Physical Exam: Vitals:   10/20/23 1130 10/20/23 1200 10/20/23 1230 10/20/23 1239  BP: Marland Kitchen)  123/111 132/85 (!) 148/82   Pulse: 78 73 76   Resp: 20 (!) 26 (!) 21   Temp:    98 F (36.7 C)   TempSrc:      SpO2: 96% 99% 97%   Weight:      Height:        General -  Middle aged African American female, mild respiratory distress HEENT - PERRLA, EOMI, atraumatic head, non tender sinuses. Lung - Clear, basal rales, no rhonchi, wheezes. Heart - S1, S2 heard, no murmurs, rubs, midline scar noted on chest, 1+ pedal edema. Abdomen - Soft, non tender, bowel sounds good Neuro - Alert, awake and oriented x 3, non focal exam. Skin - Warm and dry.  Data Reviewed:      Latest Ref Rng & Units 10/20/2023    2:16 AM 10/19/2023    7:41 AM 10/13/2023    8:48 AM  CBC  WBC 4.0 - 10.5 K/uL 20.7  25.0  16.3   Hemoglobin 12.0 - 15.0 g/dL 9.0  9.6  9.0   Hematocrit 36.0 - 46.0 % 28.7  30.4  27.9   Platelets 150 - 400 K/uL 427  497  320       Latest Ref Rng & Units 10/20/2023    2:16 AM 10/19/2023    7:41 AM 10/14/2023    3:48 AM  BMP  Glucose 70 - 99 mg/dL 80  409  93   BUN 6 - 20 mg/dL 16  15  10    Creatinine 0.44 - 1.00 mg/dL 8.11  9.14  7.82   Sodium 135 - 145 mmol/L 134  133  128   Potassium 3.5 - 5.1 mmol/L 3.7  4.0  3.9   Chloride 98 - 111 mmol/L 96  99  92   CO2 22 - 32 mmol/L 26  26  30    Calcium 8.9 - 10.3 mg/dL 7.9  8.7  8.0    CT CHEST WO CONTRAST Result Date: 10/19/2023 CLINICAL DATA:  Respiratory illness, nondiagnostic xray. EXAM: CT CHEST WITHOUT CONTRAST TECHNIQUE: Multidetector CT imaging of the chest was performed following the standard protocol without IV contrast. RADIATION DOSE REDUCTION: This exam was performed according to the departmental dose-optimization program which includes automated exposure control, adjustment of the mA and/or kV according to patient size and/or use of iterative reconstruction technique. COMPARISON:  CT angiography chest from 09/26/2023. FINDINGS: Cardiovascular: Mild cardiomegaly. Since the prior study, patient underwent median sternotomy with prosthetic mitral valve and left atrial appendage closure. There is small amount of hyperattenuating  fluid along the anterior pericardial surface and small focus of air, within normal limits for recent surgery. No discrete walled-off abscess seen. No aortic aneurysm. There are mild peripheral atherosclerotic vascular calcifications of thoracic aorta and its major branches. Mediastinum/Nodes: Redemonstration of an approximately 1.5 x 1.6 cm hypoattenuating nodule in the right inferior thyroid lobe, incompletely characterized on the current exam but meeting the size criteria for follow-up nonemergent thyroid ultrasound evaluation. The esophagus is nondistended precluding optimal assessment. No mediastinal or axillary lymphadenopathy by size criteria. Evaluation of bilateral hila is limited due to lack on intravenous contrast: however, no large hilar lymphadenopathy identified. Lungs/Pleura: The central tracheo-bronchial tree is patent. Since the prior study, there is increasing small-to-moderate left pleural effusion with associated segmental compressive atelectatic changes in the left lung lower lobe. There is a lesion of previously seen peripheral solid opacities throughout bilateral lungs. However, there are new predominantly peripheral ground-glass opacities throughout bilateral lungs with asymmetric more  involvement of the right lung upper lobe. Findings are nonspecific and may represent sequela of infection (including atypical pneumonia) or inflammation. Interval resolution of previously seen small intra lobular and interlobular septal thickening. Multiple subcentimeter sized predominantly calcified and few noncalcified nodules are again seen, mainly in the right lung lower lobe and middle lobe without interval change. No suspicious lung nodule. No right pleural effusion. No pneumothorax on either side. Upper Abdomen: Redemonstration of peripherally rim calcified gallstone without imaging signs of acute cholecystitis. There are multiple subcentimeter calcified granulomas in the spleen, nonspecific but commonly  seen as a sequela of prior granulomatous infection. Remaining visualized upper abdominal viscera within normal limits. Musculoskeletal: The visualized soft tissues of the chest wall are grossly unremarkable. No suspicious osseous lesions. There are mild to moderate multilevel degenerative changes in the visualized spine. IMPRESSION: 1. Since the prior study, there is new small-to-moderate left pleural effusion with associated segmental compressive atelectatic changes in the left lung lower lobe. 2. There is interval resolution of previously seen peripheral solid opacities throughout bilateral lungs. However, there are new predominantly peripheral ground-glass opacities throughout bilateral lungs with asymmetric more involvement of the right lung upper lobe. Findings are nonspecific and may represent sequela of infection (including atypical pneumonia) or inflammation. 3. Postsurgical changes from recent open-heart surgery, as described above. 4. Multiple other nonacute observations, as described above. Aortic Atherosclerosis (ICD10-I70.0). Electronically Signed   By: Jules Schick M.D.   On: 10/19/2023 12:54   DG Chest Port 1 View Result Date: 10/19/2023 CLINICAL DATA:  [id number] Chest pain [id number] EXAM: PORTABLE CHEST 1 VIEW COMPARISON:  10/11/2023 FINDINGS: Stable cardiomegaly status post sternotomy, mitral valve replacement, and left atrial appendage clipping. Persistent left basilar opacity. Probable small left pleural effusion. Band-like atelectasis in the left mid lung. Mild right perihilar interstitial opacity. No right-sided pleural effusion. No pneumothorax. IMPRESSION: 1. Persistent left basilar opacity, atelectasis versus pneumonia. Probable small left pleural effusion. 2. Mild right perihilar interstitial opacity, which may reflect edema versus infection. Electronically Signed   By: Duanne Guess D.O.   On: 10/19/2023 08:32    Family Communication: Discussed with patient, understand and agree. All  questions answered.  Disposition: Status is: Changed to inpatient Remains inpatient appropriate because: IV lasix, anticoagulation.  Planned Discharge Destination: Home with Home Health    MDM level 3- She is admitted with NSTEMI, acute CHF exacerbation status post mitral replacement, PVI maze 2 weeks ago.  She is on IV Lasix, anticoagulation, IV antibiotics, supplemental oxygen.  She will need close hemodynamic, electrolyte, telemetry monitoring.  Patient is at high risk for sudden clinical deterioration.  Author: Marcelino Duster, MD 10/20/2023 2:44 PM Secure chat 7am to 7pm For on call review [email or web address].

## 2023-10-20 NOTE — Plan of Care (Signed)

## 2023-10-20 NOTE — ED Notes (Signed)
 RN to bedside to introduce self to pt. Pt resting and was fixing to use bedside toilet and in no acute distress.

## 2023-10-21 ENCOUNTER — Telehealth (HOSPITAL_COMMUNITY): Payer: Self-pay | Admitting: Pharmacy Technician

## 2023-10-21 ENCOUNTER — Other Ambulatory Visit (HOSPITAL_COMMUNITY): Payer: Self-pay

## 2023-10-21 ENCOUNTER — Inpatient Hospital Stay (HOSPITAL_COMMUNITY)
Admit: 2023-10-21 | Discharge: 2023-10-21 | Disposition: A | Discharge status: Home / Self Care | Attending: Family Medicine | Admitting: Family Medicine

## 2023-10-21 DIAGNOSIS — I5021 Acute systolic (congestive) heart failure: Secondary | ICD-10-CM

## 2023-10-21 DIAGNOSIS — I214 Non-ST elevation (NSTEMI) myocardial infarction: Secondary | ICD-10-CM | POA: Diagnosis not present

## 2023-10-21 LAB — COMPREHENSIVE METABOLIC PANEL WITH GFR
ALT: 45 U/L — ABNORMAL HIGH (ref 0–44)
AST: 22 U/L (ref 15–41)
Albumin: 2.5 g/dL — ABNORMAL LOW (ref 3.5–5.0)
Alkaline Phosphatase: 73 U/L (ref 38–126)
Anion gap: 11 (ref 5–15)
BUN: 13 mg/dL (ref 6–20)
CO2: 25 mmol/L (ref 22–32)
Calcium: 8.1 mg/dL — ABNORMAL LOW (ref 8.9–10.3)
Chloride: 96 mmol/L — ABNORMAL LOW (ref 98–111)
Creatinine, Ser: 0.83 mg/dL (ref 0.44–1.00)
GFR, Estimated: >60 mL/min (ref >60)
Glucose, Bld: 75 mg/dL (ref 70–99)
Potassium: 3.8 mmol/L (ref 3.5–5.1)
Sodium: 132 mmol/L — ABNORMAL LOW (ref 135–145)
Total Bilirubin: 0.4 mg/dL (ref 0.0–1.2)
Total Protein: 6 g/dL — ABNORMAL LOW (ref 6.5–8.1)

## 2023-10-21 LAB — PROTIME-INR
INR: 2.2 — ABNORMAL HIGH (ref 0.8–1.2)
Prothrombin Time: 24.5 s — ABNORMAL HIGH (ref 11.4–15.2)

## 2023-10-21 LAB — PHOSPHORUS: Phosphorus: 3.3 mg/dL (ref 2.5–4.6)

## 2023-10-21 LAB — ECHOCARDIOGRAM COMPLETE
AR max vel: 2.38 cm2
AV Area VTI: 2.7 cm2
AV Area mean vel: 2.36 cm2
AV Mean grad: 5 mmHg
AV Peak grad: 8.8 mmHg
Ao pk vel: 1.48 m/s
Area-P 1/2: 3.68 cm2
Calc EF: 34.6 %
Height: 67 in
MV VTI: 1.48 cm2
S' Lateral: 4.1 cm
Single Plane A2C EF: 46.9 %
Single Plane A4C EF: 21.3 %
Weight: 3276.92 [oz_av]

## 2023-10-21 LAB — VITAMIN D 25 HYDROXY (VIT D DEFICIENCY, FRACTURES): Vit D, 25-Hydroxy: 7.81 ng/mL — ABNORMAL LOW (ref 30–100)

## 2023-10-21 LAB — CBC
HCT: 26.1 % — ABNORMAL LOW (ref 36.0–46.0)
Hemoglobin: 8.7 g/dL — ABNORMAL LOW (ref 12.0–15.0)
MCH: 27.2 pg (ref 26.0–34.0)
MCHC: 33.3 g/dL (ref 30.0–36.0)
MCV: 81.6 fL (ref 80.0–100.0)
Platelets: 368 10*3/uL (ref 150–400)
RBC: 3.2 MIL/uL — ABNORMAL LOW (ref 3.87–5.11)
RDW: 15.7 % — ABNORMAL HIGH (ref 11.5–15.5)
WBC: 13.3 10*3/uL — ABNORMAL HIGH (ref 4.0–10.5)
nRBC: 0.2 % (ref 0.0–0.2)

## 2023-10-21 LAB — IRON AND TIBC
Iron: 25 ug/dL — ABNORMAL LOW (ref 28–170)
Saturation Ratios: 8 % — ABNORMAL LOW (ref 10.4–31.8)
TIBC: 325 ug/dL (ref 250–450)
UIBC: 300 ug/dL

## 2023-10-21 LAB — MAGNESIUM: Magnesium: 2 mg/dL (ref 1.7–2.4)

## 2023-10-21 LAB — VITAMIN B12: Vitamin B-12: 397 pg/mL (ref 180–914)

## 2023-10-21 LAB — FOLATE: Folate: 15.8 ng/mL (ref >5.9)

## 2023-10-21 LAB — OSMOLALITY: Osmolality: 276 mosm/kg (ref 275–295)

## 2023-10-21 MED ORDER — SACUBITRIL-VALSARTAN 24-26 MG PO TABS: 1.0000 | ORAL_TABLET | Freq: Two times a day (BID) | ORAL | Status: DC

## 2023-10-21 MED ORDER — POTASSIUM CHLORIDE CRYS ER 20 MEQ PO TBCR
20.0000 meq | EXTENDED_RELEASE_TABLET | Freq: Once | ORAL | Status: AC
  Administered 2023-10-21: 20 meq via ORAL
  Filled 2023-10-21: qty 1

## 2023-10-21 MED ORDER — FUROSEMIDE 10 MG/ML IJ SOLN
80.0000 mg | Freq: Once | INTRAMUSCULAR | Status: AC
  Administered 2023-10-21: 80 mg via INTRAVENOUS
  Filled 2023-10-21: qty 8

## 2023-10-21 MED ORDER — VITAMIN D (ERGOCALCIFEROL) 1.25 MG (50000 UNIT) PO CAPS
50000.0000 [IU] | ORAL_CAPSULE | ORAL | Status: DC
Start: 2023-10-21 — End: 2023-12-16
  Administered 2023-10-21: 50000 [IU] via ORAL
  Filled 2023-10-21: qty 1

## 2023-10-21 MED ORDER — VITAMIN C 500 MG PO TABS: 500.0000 mg | ORAL_TABLET | Freq: Every day | ORAL | Status: DC

## 2023-10-21 MED ORDER — EMPAGLIFLOZIN 10 MG PO TABS
10.0000 mg | ORAL_TABLET | Freq: Every day | ORAL | Status: DC
  Administered 2023-10-21 – 2023-10-22 (×2): 10 mg via ORAL
  Filled 2023-10-21 (×3): qty 1

## 2023-10-21 MED ORDER — SACUBITRIL-VALSARTAN 24-26 MG PO TABS
1.0000 | ORAL_TABLET | Freq: Two times a day (BID) | ORAL | Status: DC
  Administered 2023-10-21 – 2023-10-22 (×2): 1 via ORAL
  Filled 2023-10-21 (×2): qty 1

## 2023-10-21 MED ORDER — POLYSACCHARIDE IRON COMPLEX 150 MG PO CAPS: 150.0000 mg | ORAL_CAPSULE | Freq: Every day | ORAL | Status: DC

## 2023-10-21 MED ORDER — LOSARTAN POTASSIUM 25 MG PO TABS
12.5000 mg | ORAL_TABLET | Freq: Every day | ORAL | Status: DC
  Administered 2023-10-21: 12.5 mg via ORAL
  Filled 2023-10-21: qty 1

## 2023-10-21 MED ORDER — WARFARIN SODIUM 4 MG PO TABS
4.0000 mg | ORAL_TABLET | Freq: Once | ORAL | Status: AC
  Administered 2023-10-21: 4 mg via ORAL
  Filled 2023-10-21: qty 1

## 2023-10-21 MED ORDER — IRON SUCROSE 300 MG IVPB - SIMPLE MED
300.0000 mg | Freq: Every day | Status: DC
  Administered 2023-10-21: 300 mg via INTRAVENOUS
  Filled 2023-10-21: qty 265
  Filled 2023-10-21: qty 300
  Filled 2023-10-21: qty 265

## 2023-10-21 NOTE — Progress Notes (Signed)
 Heart Failure Navigator Progress Note  Assessed for Heart & Vascular TOC clinic readiness.  Patient does not meet criteria due to current Advanced Heart Failure Team patient of Dr. Bevelyn Buckles. Bensimhon, MD.  Navigator will sign off at this time.  Roxy Horseman, RN, BSN [facility] Heart Failure Navigator Secure Chat Only

## 2023-10-21 NOTE — Consult Note (Addendum)
 Advanced Heart Failure Team Consult Note   Primary Physician: Ronnald Ramp, MD Cardiologist:  Julien Nordmann, MD HF MD: Dr Gala Romney   Reason for Consultation: Heart Failure  HPI:    Sandra Hines is seen today for evaluation of heart failure at the request of Dr End.   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    Admitted to [facility] 09/25/23 with A fib RVR. Echo showed LVEF ef 45-50% severe mitral regurgitation mean MV gradient 11 and mitral valve rheumatic.  TEE 2025 EF 55-60% RV low normal. LHC/RHC no CAD, CO 4.7 and CI 2.3 .Transferred to [facility] for CT surgery consultation. She underwent mechanical mitral valve with MAZE. Developed pauses and sinus arrest post cardioversion. GDMT limited by hyperkalemia. Placed on coumadin. Lasix stopped on the day of discharge due to hyponatremia. Discharged 10/14/23.   After discharge she developed lower extremity edema and weight gain.   Presented to [facility]  10/19/23 with chest pain. CXR small pleural effusion and edema. CT chest - Small-mod left pleural effusion. Possible PNA or inflammation with post surgical changes from open heart surgery.  Bld CX NGTD, respiratory panel negative, BNP 1629, HS Trop 408>490,  WBC 25. Placed on antibiotics for possible PNA. Diuresing with IV lasix.  Echo today EF down 25-30% RV severely reduced.   Home Medications Prior to Admission medications   Medication Sig Start Date End Date Taking? Authorizing Provider  acetaminophen (TYLENOL) 325 MG tablet Take 2 tablets (650 mg total) by mouth every 4 (four) hours as needed for headache or mild pain (pain score 1-3). 10/03/23  Yes Loyce Dys, MD  amiodarone (PACERONE) 200 MG tablet Take 2 tablets (400 mg total) by mouth 2 (two) times daily. For 5 days, then 400 mg daily for 5 days, then 200 mg daily 10/14/23  Yes Gold, Wayne E, PA-C  aspirin 81 MG chewable tablet  Chew 1 tablet (81 mg total) by mouth daily. 10/14/23  Yes Gold, Wayne E, PA-C  busPIRone (BUSPAR) 5 MG tablet Take 1 tablet (5 mg total) by mouth 3 (three) times daily. 07/19/23  Yes Jacky Kindle, FNP  DULoxetine (CYMBALTA) 20 MG capsule Take 2 capsules (40 mg total) by mouth daily. 08/15/23  Yes Simmons-Robinson, Makiera, MD  rosuvastatin (CRESTOR) 40 MG tablet Take 1 tablet (40 mg total) by mouth daily. 07/19/23  Yes Merita Norton T, FNP  warfarin (COUMADIN) 2.5 MG tablet Take 1 tablet (2.5 mg total) by mouth as directed. Alternating schedule every 3 days, take 2.5 mg daily for 2 days, then 5 mg daily- then repeat 3 day cycle. Then per coumadin clinic 10/14/23  Yes Gold, Wayne E, PA-C  Fe Fum-Vit C-Vit B12-FA (TRIGELS-F FORTE) CAPS capsule Take 1 capsule by mouth daily. 10/14/23   Gold, Wayne E, PA-C  ipratropium-albuterol (DUONEB) 0.5-2.5 (3) MG/3ML SOLN Take 3 mLs by nebulization every 2 (two) hours as needed (shortness of breath). 10/14/23   Gold, Wayne E, PA-C  isosorbide-hydrALAZINE (BIDIL) 20-37.5 MG tablet Take 0.5 tablets by mouth 3 (three) times daily. Patient not taking: Reported on 10/19/2023 10/14/23   Gershon Crane E, PA-C  magnesium hydroxide (MILK OF MAGNESIA) 400 MG/5ML suspension Take 30 mLs by mouth daily as needed for mild constipation or moderate constipation. 10/03/23   Loyce Dys, MD  oxyCODONE (OXY IR/ROXICODONE) 5 MG immediate release tablet Take 1 tablet (5 mg  total) by mouth every 6 (six) hours as needed for up to 7 days for severe pain (pain score 7-10). 10/14/23 10/21/23  Loreli Slot, MD    Past Medical History: Past Medical History:  Diagnosis Date   Hypertension     Past Surgical History: Past Surgical History:  Procedure Laterality Date   CLIPPING OF ATRIAL APPENDAGE  10/07/2023   Procedure: CLIPPING, LEFT ATRIAL APPENDAGE USING MEDTRONIC PENDITURE LAA EXCLUSION SYSTEM SIZE ;  Surgeon: Eugenio Hoes, MD;  Location: [facility] OR;  Service: Open Heart Surgery;;    INTRAOPERATIVE TRANSESOPHAGEAL ECHOCARDIOGRAM N/A 10/07/2023   Procedure: ECHOCARDIOGRAM, TRANSESOPHAGEAL, INTRAOPERATIVE;  Surgeon: Eugenio Hoes, MD;  Location: [facility] OR;  Service: Open Heart Surgery;  Laterality: N/A;   MAZE N/A 10/07/2023   Procedure: MAZE PROCEDURE;  Surgeon: Eugenio Hoes, MD;  Location: [facility] OR;  Service: Open Heart Surgery;  Laterality: N/A;   MITRAL VALVE REPLACEMENT N/A 10/07/2023   Procedure: MITRAL VALVE REPLACEMENT USING SJM MASTERS SERIES MECHANICAL MITRAL HEART VALVE SIZE ;  Surgeon: Eugenio Hoes, MD;  Location: [facility] OR;  Service: Open Heart Surgery;  Laterality: N/A;   RIGHT/LEFT HEART CATH AND CORONARY ANGIOGRAPHY N/A 10/03/2023   Procedure: RIGHT/LEFT HEART CATH AND CORONARY ANGIOGRAPHY;  Surgeon: Yvonne Kendall, MD;  Location: [facility] INVASIVE CV LAB;  Service: Cardiovascular;  Laterality: N/A;   TEE WITHOUT CARDIOVERSION N/A 10/02/2023   Procedure: ECHOCARDIOGRAM, TRANSESOPHAGEAL;  Surgeon: Antonieta Iba, MD;  Location: [facility] ORS;  Service: Cardiovascular;  Laterality: N/A;    Family History: History reviewed. No pertinent family history.  Social History: Social History   Socioeconomic History   Marital status: Single    Spouse name: Not on file   Number of children: 8   Years of education: Not on file   Highest education level: Some college, no degree  Occupational History   Not on file  Tobacco Use   Smoking status: Every Day    Current packs/day: 1.00    Types: Cigarettes   Smokeless tobacco: Never  Vaping Use   Vaping status: Never Used  Substance and Sexual Activity   Alcohol use: No   Drug use: No   Sexual activity: Yes    Birth control/protection: None, Condom  Other Topics Concern   Not on file  Social History Narrative   Not on file   Social Drivers of Health   Financial Resource Strain: Medium Risk (09/30/2023)   Overall Financial Resource Strain (CARDIA)    Difficulty of Paying Living Expenses: Somewhat hard  Food  Insecurity: No Food Insecurity (10/20/2023)   Hunger Vital Sign    Worried About Running Out of Food in the Last Year: Never true    Ran Out of Food in the Last Year: Never true  Transportation Needs: No Transportation Needs (10/20/2023)   PRAPARE - Administrator, Civil Service (Medical): No    Lack of Transportation (Non-Medical): No  Physical Activity: Not on file  Stress: Not on file  Social Connections: Moderately Integrated (10/20/2023)   Social Connection and Isolation Panel [NHANES]    Frequency of Communication with Friends and Family: Three times a week    Frequency of Social Gatherings with Friends and Family: Once a week    Attends Religious Services: 1 to 4 times per year    Active Member of [employer] or Organizations: Yes    Attends Banker Meetings: 1 to 4 times per year    Marital Status: Never married    Allergies:  Allergies  Allergen Reactions   Penicillins Hives    Has patient had a PCN reaction causing immediate rash, facial/tongue/throat swelling, SOB or lightheadedness with hypotension: No Has patient had a PCN reaction causing severe rash involving mucus membranes or skin necrosis: No Has patient had a PCN reaction that required hospitalization: No Has patient had a PCN reaction occurring within the last 10 years: No If all of the above answers are "NO", then may proceed with Cephalosporin use.     Objective:    Vital Signs:   Temp:  [98 F (36.7 C)-99.3 F (37.4 C)] 98.1 F (36.7 C) (03/31 0929) Pulse Rate:  [55-78] 77 (03/31 0929) Resp:  [13-26] 19 (03/31 0513) BP: (106-148)/(70-111) 129/80 (03/31 0929) SpO2:  [96 %-100 %] 97 % (03/31 0929) Weight:  [92.9 kg] 92.9 kg (03/31 0500) Last BM Date : 10/18/23  Weight change: Filed Weights   10/19/23 0733 10/20/23 1944 10/21/23 0500  Weight: 97.5 kg 92.9 kg 92.9 kg    Intake/Output:   Intake/Output Summary (Last 24 hours) at 10/21/2023 1039 Last data filed at 10/21/2023  0410 Gross per 24 hour  Intake 964.17 ml  Output 2000 ml  Net -1035.83 ml      Physical Exam    General:   No resp difficulty HEENT: normal Neck: supple. JVP 9-10   Carotids 2+ bilat; no bruits. No lymphadenopathy or thyromegaly appreciated. Cor: PMI nondisplaced. Regular rate & rhythm. No rubs, gallops or murmurs. Lungs: clear Abdomen: soft, nontender, nondistended. No hepatosplenomegaly. No bruits or masses. Good bowel sounds. Extremities: no cyanosis, clubbing, rash, edema Neuro: alert & orientedx3, cranial nerves grossly intact. moves all 4 extremities w/o difficulty. Affect pleasant   Telemetry  SR   EKG    On admit in SR   Labs   Basic Metabolic Panel: Recent Labs  Lab 10/19/23 0741 10/20/23 0216 10/21/23 0346 10/21/23 0834  NA 133* 134* 132*  --   K 4.0 3.7 3.8  --   CL 99 96* 96*  --   CO2 26 26 25   --   GLUCOSE 120* 80 75  --   BUN 15 16 13   --   CREATININE 0.71 0.79 0.83  --   CALCIUM 8.7* 7.9* 8.1*  --   MG  --   --   --  2.0  PHOS  --   --   --  3.3    Liver Function Tests: Recent Labs  Lab 10/20/23 0216 10/21/23 0346  AST 30 22  ALT 65* 45*  ALKPHOS 87 73  BILITOT 0.3 0.4  PROT 6.3* 6.0*  ALBUMIN 2.6* 2.5*   No results for input(s): "LIPASE", "AMYLASE" in the last 168 hours. No results for input(s): "AMMONIA" in the last 168 hours.  CBC: Recent Labs  Lab 10/19/23 0741 10/20/23 0216 10/21/23 0346  WBC 25.0* 20.7* 13.3*  HGB 9.6* 9.0* 8.7*  HCT 30.4* 28.7* 26.1*  MCV 86.9 87.0 81.6  PLT 497* 427* 368    Cardiac Enzymes: No results for input(s): "CKTOTAL", "CKMB", "CKMBINDEX", "TROPONINI" in the last 168 hours.  BNP: BNP (last 3 results) Recent Labs    09/25/23 1711 10/19/23 0741  BNP 700.0* 1,629.6*    ProBNP (last 3 results) No results for input(s): "PROBNP" in the last 8760 hours.   CBG: Recent Labs  Lab 10/14/23 1123  GLUCAP 117*    Coagulation Studies: Recent Labs    10/19/23 0741 10/20/23 0216  10/21/23 0834  LABPROT 24.6* 26.3* 24.5*  INR 2.2* 2.4*  2.2*     Imaging   ECHOCARDIOGRAM COMPLETE Result Date: 10/21/2023    ECHOCARDIOGRAM REPORT   Patient Name:   Sandra Hines Date of Exam: 10/21/2023 Medical Rec #:  [id number]    Height:       67.0 in Accession #:    [id number]   Weight:       204.8 lb Date of Birth:  1971/04/27   BSA:          2.043 m Patient Age:    52 years     BP:           108/74 mmHg Patient Gender: F            HR:           55 bpm. Exam Location:  [facility] Procedure: 2D Echo, Cardiac Doppler, Color Doppler, Strain Analysis and 3D Echo            (Both Spectral and Color Flow Doppler were utilized during            procedure). Indications:     CHF-acute systolic I50.21  History:         Patient has prior history of Echocardiogram examinations, most                  recent 10/01/2023. Risk Factors:Hypertension.  Sonographer:     Cristela Blue Referring Phys:  [phone] Francoise Schaumann [place] Diagnosing Phys: Yvonne Kendall MD  Sonographer Comments: Global longitudinal strain was attempted. IMPRESSIONS  1. Left ventricular ejection fraction, by estimation, is 25 to 30%. Left ventricular ejection fraction by 3D volume is 18 %. The left ventricle has severely decreased function. The left ventricle demonstrates global hypokinesis. The left ventricular internal cavity size was mildly dilated. There is moderate left ventricular hypertrophy. Left ventricular diastolic function could not be evaluated. The average left ventricular global longitudinal strain is -5.0 %. The global longitudinal strain is abnormal.  2. Right ventricular systolic function is severely reduced. The right ventricular size is mildly enlarged. Tricuspid regurgitation signal is inadequate for assessing PA pressure.  3. Left atrial size was severely dilated.  4. The mitral valve has been repaired/replaced. Trivial mitral valve regurgitation. The mean mitral valve gradient is 11.0 mmHg.  5. The aortic valve has an indeterminant number of  cusps. Aortic valve regurgitation is mild to moderate.  6. The inferior vena cava is normal in size with greater than 50% respiratory variability, suggesting right atrial pressure of 3 mmHg. FINDINGS  Left Ventricle: Left ventricular ejection fraction, by estimation, is 25 to 30%. Left ventricular ejection fraction by 3D volume is 18 %. The left ventricle has severely decreased function. The left ventricle demonstrates global hypokinesis. The average  left ventricular global longitudinal strain is -5.0 %. Strain was performed and the global longitudinal strain is abnormal. The left ventricular internal cavity size was mildly dilated. There is moderate left ventricular hypertrophy. Abnormal (paradoxical) septal motion, consistent with left bundle branch block. Left ventricular diastolic function could not be evaluated due to mitral valve replacement. Left ventricular diastolic function could not be evaluated. Right Ventricle: The right ventricular size is mildly enlarged. No increase in right ventricular wall thickness. Right ventricular systolic function is severely reduced. Tricuspid regurgitation signal is inadequate for assessing PA pressure. Left Atrium: Left atrial size was severely dilated. Right Atrium: Right atrial size was normal in size. Pericardium: Trivial pericardial effusion is present. Mitral Valve: The mitral valve has been repaired/replaced. Trivial mitral valve regurgitation. There  is a 29 mm [employer] mechanical valve present in the mitral position. MV peak gradient, 19.4 mmHg. The mean mitral valve gradient is 11.0 mmHg. Tricuspid Valve: The tricuspid valve is not well visualized. Tricuspid valve regurgitation is trivial. Aortic Valve: The aortic valve has an indeterminant number of cusps. Aortic valve regurgitation is mild to moderate. Aortic valve mean gradient measures 5.0 mmHg. Aortic valve peak gradient measures 8.8 mmHg. Aortic valve area, by VTI measures 2.70 cm. Pulmonic Valve: The  pulmonic valve was normal in structure. Pulmonic valve regurgitation is mild. No evidence of pulmonic stenosis. Aorta: The aortic root is normal in size and structure. Pulmonary Artery: The pulmonary artery is of normal size. Venous: The inferior vena cava is normal in size with greater than 50% respiratory variability, suggesting right atrial pressure of 3 mmHg. IAS/Shunts: The interatrial septum was not well visualized. Additional Comments: 3D was performed not requiring image post processing on an independent workstation and was abnormal. There is a small pleural effusion in the left lateral region.  LEFT VENTRICLE PLAX 2D LVIDd:         5.30 cm         Diastology LVIDs:         4.10 cm         LV e' medial:   8.05 cm/s LV PW:         1.60 cm         LV E/e' medial: 23.5 LV IVS:        1.60 cm LVOT diam:     2.00 cm         2D Longitudinal LV SV:         48              Strain LV SV Index:   23              2D Strain GLS   -5.0 % LVOT Area:     3.14 cm        Avg:                                 3D Volume EF LV Volumes (MOD)               LV 3D EF:    Left LV vol d, MOD    136.0 ml                   ventricul A2C:                                        ar LV vol d, MOD    141.0 ml                   ejection A4C:                                        fraction LV vol s, MOD    72.2 ml                    by 3D A2C:  volume is LV vol s, MOD    111.0 ml                   18 %. A4C: LV SV MOD A2C:   63.8 ml LV SV MOD A4C:   141.0 ml      3D Volume EF: LV SV MOD BP:    47.3 ml       3D EF:        18 % RIGHT VENTRICLE RV Basal diam:  5.00 cm RV Mid diam:    2.50 cm RV S prime:     8.59 cm/s TAPSE (M-mode): 1.3 cm LEFT ATRIUM              Index        RIGHT ATRIUM           Index LA diam:        3.80 cm  1.86 cm/m   RA Area:     18.80 cm LA Vol (A2C):   96.7 ml  47.34 ml/m  RA Volume:   52.00 ml  25.46 ml/m LA Vol (A4C):   117.0 ml 57.28 ml/m LA Biplane Vol: 109.0 ml 53.36  ml/m  AORTIC VALVE                     PULMONIC VALVE AV Area (Vmax):    2.38 cm      PR End Diast Vel: 12.39 msec AV Area (Vmean):   2.36 cm AV Area (VTI):     2.70 cm AV Vmax:           148.00 cm/s AV Vmean:          102.000 cm/s AV VTI:            0.177 m AV Peak Grad:      8.8 mmHg AV Mean Grad:      5.0 mmHg LVOT Vmax:         112.00 cm/s LVOT Vmean:        76.500 cm/s LVOT VTI:          0.152 m LVOT/AV VTI ratio: 0.86  AORTA Ao Root diam: 3.40 cm MITRAL VALVE MV Area (PHT): 3.68 cm     SHUNTS MV Area VTI:   1.48 cm     Systemic VTI:  0.15 m MV Peak grad:  19.4 mmHg    Systemic Diam: 2.00 cm MV Mean grad:  11.0 mmHg MV Vmax:       2.20 m/s MV Vmean:      155.0 cm/s MV Decel Time: 206 msec MV E velocity: 189.00 cm/s Yvonne Kendall MD Electronically signed by Yvonne Kendall MD Signature Date/Time: 10/21/2023/10:29:14 AM    Final      Medications:     Current Medications:  amiodarone  200 mg Oral Daily   aspirin  81 mg Oral Daily   DULoxetine  40 mg Oral Daily   enoxaparin (LOVENOX) injection  1 mg/kg Subcutaneous Q12H   furosemide  40 mg Intravenous BID   isosorbide-hydrALAZINE  1 tablet Oral TID   losartan  12.5 mg Oral Daily   rosuvastatin  40 mg Oral Daily   sodium chloride flush  3 mL Intravenous Q12H   warfarin  4 mg Oral ONCE-1600   Warfarin - Pharmacist Dosing Inpatient   Does not apply q1600    Infusions:  azithromycin Stopped (10/20/23 2356)   cefTRIAXone (ROCEPHIN)  IV Stopped (10/20/23 2327)  Patient Profile   Sandra Hines is a 53 year old with a history of HTN, cerebral aneurysm repaired at [facility] 2019, tobacco abuse, PAF, and 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    Acute Biventricular HF  Assessment/Plan  1. Acute Biventricular HFrEF Echo this admit LVEF down to 20-25% and RV severely reduced from previous 45-50%. TEE EF 55-60% . Suspect EF down from recent valve surgery.  BNP > 1500. Diuresing with IV lasix.  - Needs additional diuresis. Consider po torsemide 20  mg  daily tomorrow.   - Stop Bidil  -Start entresto 24-26 mg  - Start farxiga 10 mg daily   Optimize GDMT for at least 3 months. Repeat ECHO.   2. Leukocytosis ?PNA  WBC 25 on admit--> 13 Bld Cx-NGTD On azithromycin + rocephin.   3. S/P Mechanical Mitral Valve  I notified CT surgery  INR 2.2 Discussed with pharm D.  INR goal 2.5-3.  4. PAF S/P MAZE 09/2023 . She has not been on bb. On amio 200 mg daily.  Maintaining SR.  On coumadin. INR 2.2    Length of Stay: 1  Amy Clegg, NP  10/21/2023, 10:39 AM  Advanced Heart Failure Team Pager [phone] (M-F; 7a - 5p)  Please contact [facility] for night-coverage after hours (4p -7a ) and weekends on [email or web address]  Patient seen with NP.  I formulated the plan and agree with the above note.   Patient had severe mitral regurgitation due to rheumatic heart disease.  Echo in 3/25 showed severe rheumatic MR, no significant Sandra, LV EF 45-50%, mild RV dilation.  Patient had mechanical MV replacement with Maze and LA appendage occlusion on 10/07/23.  She was discharged home without Lasix.    Since getting home, patient reports progressive dyspnea and weight gain.  She came back to the ER, was noted to be in CHF.  I reviewed her echo this admission, it showed EF 25-30%, mild LV dilation, moderate LVH, mild RV enlargement with mildly decreased systolic function, mechanical MV with mean gradient 11 mmHg, ?mild-moderate AI, IVC not dilated.   Patient has been in NSR on telemetry.  She has been diuresed with IV Lasix, weight trending down.   General: NAD Neck: JVP 8-9 cm with HJR, no thyromegaly or thyroid nodule.  Lungs: Clear to auscultation bilaterally with normal respiratory effort. CV: Nondisplaced PMI.  Heart regular S1/S2 with mechanical S1, no S3/S4, 2/6 SEM RUSB.  No peripheral edema.  No carotid bruit.  Normal pedal pulses.  Abdomen: Soft, nontender, no hepatosplenomegaly, no distention.  Skin: Intact without lesions or rashes.   Neurologic: Alert and oriented x 3.  Psych: Normal affect. Extremities: No clubbing or cyanosis.  HEENT: Normal.   1. Acute systolic CHF: S/p mechanical MV replacement.  Pre-op echo with EF 45-50%.  Cath pre-op with no significant CAD. Post-op echo this admission with EF 25-30%, mild LV dilation, moderate LVH, mild RV enlargement with mildly decreased systolic function, mechanical MV with mean gradient 11 mmHg, ?mild-moderate AI, IVC not dilated.  I suspect we are seeing the patient's true LV function in setting of higher afterload with severe mitral regurgitation repaired. She went home without a diuretic, gained weight and was admitted with significant volume overload.  She has diuresed over the weekend.  While IVC not significantly dilated on echo, she does appear at least mildly volume overloaded by exam still and creatinine is stable.  - Would give IV Lasix today, 1 more dose this evening (80 mg IV).  Start  torsemide 20 mg daily tomorrow.  - Would stop Bidil and losartan, start Entresto 24/26 bid.  - Start Jardiance 10 mg daily.  2. 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  Patient had mechanical MV replacement with Maze and LA appendage occlusion on 10/07/23.  - Continue warfarin for goal INR 2.5-3.5 - Continue ASA 81 daily.  - Endocarditis prophylaxis with dentistry.  3. Atrial fibrillation: Paroxysmal.  She had Maze procedure with her surgery.  She is in NSR currently.  - Continue amiodarone 200 mg daily, will eventually stop.  4. ID: WBCs high at admission, empirically covered with ceftriaxone/azithromycin.   Marca Ancona 10/21/2023 11:55 AM

## 2023-10-21 NOTE — Progress Notes (Signed)
 Rounding Note    Patient Name: Sandra Hines Date of Encounter: 10/21/2023  [facility] Cardiologist: Julien Nordmann, MD   Subjective   Patient reports feeling better with improvements in shortness of breath and lower extremity swelling. No further chest pain. She is down 10 lbs since admission. Kidney function stable. K 3.8.  Inpatient Medications    Scheduled Meds:  amiodarone  200 mg Oral Daily   aspirin  81 mg Oral Daily   DULoxetine  40 mg Oral Daily   enoxaparin (LOVENOX) injection  1 mg/kg Subcutaneous Q12H   furosemide  40 mg Intravenous BID   isosorbide-hydrALAZINE  1 tablet Oral TID   rosuvastatin  40 mg Oral Daily   sodium chloride flush  3 mL Intravenous Q12H   Warfarin - Pharmacist Dosing Inpatient   Does not apply q1600   Continuous Infusions:  azithromycin Stopped (10/20/23 2356)   cefTRIAXone (ROCEPHIN)  IV Stopped (10/20/23 2327)   PRN Meds: acetaminophen, morphine injection, nitroGLYCERIN, ondansetron **OR** ondansetron (ZOFRAN) IV, oxyCODONE, sodium chloride flush   Vital Signs    Vitals:   10/20/23 1944 10/20/23 2321 10/21/23 0500 10/21/23 0513  BP: 138/78 124/89  108/74  Pulse: 78 77  (!) 55  Resp: 18 18  19   Temp: 98.2 F (36.8 C) 99.3 F (37.4 C)  98.5 F (36.9 C)  TempSrc: Oral Oral  Oral  SpO2: 97% 96%  100%  Weight: 92.9 kg  92.9 kg   Height: 5\' 7"  (1.702 m)       Intake/Output Summary (Last 24 hours) at 10/21/2023 0804 Last data filed at 10/21/2023 0410 Gross per 24 hour  Intake 964.17 ml  Output 2000 ml  Net -1035.83 ml      10/21/2023    5:00 AM 10/20/2023    7:44 PM 10/19/2023    7:33 AM  Last 3 Weights  Weight (lbs) 204 lb 12.9 oz 204 lb 12.9 oz 215 lb  Weight (kg) 92.9 kg 92.9 kg 97.523 kg      Telemetry    Sinus rhythm with first degree AV block, several paroxysms of atrial fibrillation/flutter vs atrial tachycardia overnight with rates up to 120 bpm, lasting 20-30 minutes at a time - Personally  Reviewed  Physical Exam   GEN: No acute distress.   Neck: No JVD Cardiac: RRR, no murmurs, rubs, or gallops. Mechanical click.  Respiratory: Lung sounds diminished in L base. Otherwise clear.  GI: Soft, nontender, non-distended  MS: No edema; No deformity. Neuro:  Nonfocal  Psych: Normal affect   Labs    High Sensitivity Troponin:   Recent Labs  Lab 10/19/23 0741 10/19/23 0926 10/19/23 1108 10/19/23 1309 10/20/23 0426  TROPONINIHS 408* 490* 449* 518* 505*     Chemistry Recent Labs  Lab 10/19/23 0741 10/20/23 0216 10/21/23 0346  NA 133* 134* 132*  K 4.0 3.7 3.8  CL 99 96* 96*  CO2 26 26 25   GLUCOSE 120* 80 75  BUN 15 16 13   CREATININE 0.71 0.79 0.83  CALCIUM 8.7* 7.9* 8.1*  PROT  --  6.3* 6.0*  ALBUMIN  --  2.6* 2.5*  AST  --  30 22  ALT  --  65* 45*  ALKPHOS  --  87 73  BILITOT  --  0.3 0.4  GFRNONAA >60 >60 >60  ANIONGAP 8 12 11     Lipids No results for input(s): "CHOL", "TRIG", "HDL", "LABVLDL", "LDLCALC", "CHOLHDL" in the last 168 hours.  Hematology Recent Labs  Lab  10/19/23 0741 10/20/23 0216 10/21/23 0346  WBC 25.0* 20.7* 13.3*  RBC 3.50* 3.30* 3.20*  HGB 9.6* 9.0* 8.7*  HCT 30.4* 28.7* 26.1*  MCV 86.9 87.0 81.6  MCH 27.4 27.3 27.2  MCHC 31.6 31.4 33.3  RDW 15.9* 15.9* 15.7*  PLT 497* 427* 368   Thyroid No results for input(s): "TSH", "FREET4" in the last 168 hours.  BNP Recent Labs  Lab 10/19/23 0741  BNP 1,629.6*    DDimer No results for input(s): "DDIMER" in the last 168 hours.   Radiology    CT CHEST WO CONTRAST Result Date: 10/19/2023 IMPRESSION: 1. Since the prior study, there is new small-to-moderate left pleural effusion with associated segmental compressive atelectatic changes in the left lung lower lobe. 2. There is interval resolution of previously seen peripheral solid opacities throughout bilateral lungs. However, there are new predominantly peripheral ground-glass opacities throughout bilateral lungs with asymmetric more  involvement of the right lung upper lobe. Findings are nonspecific and may represent sequela of infection (including atypical pneumonia) or inflammation. 3. Postsurgical changes from recent open-heart surgery, as described above. 4. Multiple other nonacute observations, as described above. Aortic Atherosclerosis (ICD10-I70.0). Electronically Signed   By: Jules Schick M.D.   On: 10/19/2023 12:54    Cardiac Studies   LHC 10/03/2023 Conclusions: No angiographically significant atherosclerotic coronary artery disease.  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 Moderately elevated left heart, right heart, and pulmonary artery pressures.  Large v-waves observed on PCWP tracing consistent with the patient's severe mitral regurgitation. Low normal to mildly reduced Fick cardiac output/index.   Recommendations: Escalate diuresis and consider consultation with advanced heart failure team for optimization of acute diastolic heart failure in the setting of severe mitral valve regurgitation. Cardiac surgery consultation for mitral valve replacement.  Response to medical therapy will dictate if this can be done as an outpatient or if the patient will need transfer to [facility] for inpatient evaluation. Resume heparin infusion 2 hours after TR band removal. Primary prevention of coronary artery disease.   TEE 10/02/2023 1. Left ventricular ejection fraction, by estimation, is 55 to 60%. The  left ventricle has normal function. The left ventricle has no regional  wall motion abnormalities.   2. Right ventricular systolic function is low normal. The right  ventricular size is normal.   3. The mitral valve is rheumatic, both anterior and posterior leaflets  involved. Severe mitral valve regurgitation, central and eccentric jet  noted. No evidence of mitral stenosis.   4. The aortic valve is normal in structure. Aortic valve regurgitation is  not visualized. No aortic stenosis is present.   5. The  inferior vena cava is normal in size with greater than 50%  respiratory variability, suggesting right atrial pressure of 3 mmHg.   6. No left atrial/left atrial appendage thrombus was detected.   7. 3D performed of the mitral valve and demonstrates rheumatic mitral  valve.    2D limited echo 10/01/2023 1. Left ventricular ejection fraction, by estimation, is 55 to 60%. The  left ventricle has normal function. The left ventricular internal cavity  size was severely dilated. There is mild left ventricular hypertrophy.   2. Left atrial size was severely dilated.   3. 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 No  evidence of mitral stenosis.   4. The aortic valve is tricuspid. Aortic valve regurgitation is trivial.   5. There is normal pulmonary artery systolic pressure.   6. The inferior vena cava  is normal in size with greater than 50%  respiratory variability, suggesting right atrial pressure of 3 mmHg.    2D echo 09/26/2023 1. Left ventricular ejection fraction, by estimation, is 45 to 50%. The  left ventricle has mildly decreased function. The left ventricle  demonstrates global hypokinesis. The left ventricular internal cavity size  was mildly dilated. There is mild left  ventricular hypertrophy. Left ventricular diastolic parameters are  indeterminate.   2. Right ventricular systolic function is mildly reduced. The right  ventricular size is normal. There is normal pulmonary artery systolic  pressure. The estimated right ventricular systolic pressure is 25.8 mmHg.   3. Left atrial size was severely dilated.   4. 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 No  evidence of mitral stenosis. The mean mitral valve gradient is 11.0 mmHg.  Moderate mitral annular calcification.   5. Tricuspid valve regurgitation is mild to moderate.   6. The aortic valve is tricuspid. Aortic valve regurgitation is mild. No  aortic stenosis is present.   7.  There is borderline dilatation of the ascending aorta, measuring 38  mm.   8. The inferior vena cava is normal in size with greater than 50%  respiratory variability, suggesting right atrial pressure of 3 mmHg.   Patient Profile     53 y.o. 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 Leafy Ro. She was discharged on 10/14/23. Readmitted for acute on chronic diastolic heart failure.   Assessment & Plan    Acute on chronic diastolic heart failure - TTE 3/6 with EF 45-50%. Most recent TTE 3/11 and TEE 3/12 with EF 55-60%.  - Repeat echo post op done 3/30, results pending - CXR 3/29 with small to mod L pleural effusion - Volume status improving - Continue IV Lasix 40 mg BID - No BB given history of post conversion pauses and sinus arrest - Continue bidil 20-37.5 mg three times daily - Will start losartan 12.5 mg daily, monitor BMP closely given history of hyperkalemia - Continue to monitor kidney function, strict I/Os, and daily weights - Recommend AHF consultation   Chest pain - Preop cath 3/13 without coronary disease - Troponin peaked at 505 in the setting of acute CHF, suspect demand ischemia - No further testing indicated at this time  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 On lovenox until INR at goal 2.5-3.5 x 24 hours.   Paroxysmal atrial fibrillation, s/p MAZE and LAA occlusion 10/07/2023 - On coumadin long term for mechanical valve - Currently in sinus rhythm, although telemetry shows several paroxysms of atrial fibrillation/flutter vs atrial tachycardia lasting 20-30 minutes overnight - Continue amiodarone  Hypertension - BP stable - Continue Bidil as above, starting losartan as above  Hyperlipidemia - Continue rosuvastatin  For questions or updates, please contact [facility] Please consult [email or web address] for contact info under         Signed, Orion Crook, PA-C  10/21/2023, 8:04 AM

## 2023-10-21 NOTE — Telephone Encounter (Signed)
 Pharmacy Patient Advocate Encounter  Received notification from [facility] that Prior Authorization for Jardiance 10MG  tabletshas been APPROVED from 10/21/2023 to 10/20/2024. Ran test claim, Copay is $0.00. This test claim was processed through [facility]- copay amounts may vary at other pharmacies due to pharmacy/plan contracts, or as the patient moves through the different stages of their insurance plan.   PA #/Case ID/Reference #: [id number] Key: Madelyn Flavors

## 2023-10-21 NOTE — Consult Note (Addendum)
 PHARMACY - ANTICOAGULATION CONSULT NOTE  Pharmacy Consult for Enoxaparin/Warfarin  Indication: Mechanical Mitral Valve Replacement/Atrial Fibrillation   Allergies  Allergen Reactions   Penicillins Hives    Has patient had a PCN reaction causing immediate rash, facial/tongue/throat swelling, SOB or lightheadedness with hypotension: No Has patient had a PCN reaction causing severe rash involving mucus membranes or skin necrosis: No Has patient had a PCN reaction that required hospitalization: No Has patient had a PCN reaction occurring within the last 10 years: No If all of the above answers are "NO", then may proceed with Cephalosporin use.    Patient Measurements: Height: 5\' 7"  (170.2 cm) Weight: 92.9 kg (204 lb 12.9 oz) IBW/kg (Calculated) : 61.6 HEPARIN DW (KG): 81.8  Vital Signs: Temp: 98.1 F (36.7 C) (03/31 0929) Temp Source: Oral (03/31 0513) BP: 129/80 (03/31 0929) Pulse Rate: 77 (03/31 0929)  Labs: Recent Labs    10/19/23 0741 10/19/23 0926 10/19/23 1108 10/19/23 1147 10/19/23 1309 10/19/23 1827 10/20/23 0216 10/20/23 0426 10/21/23 0346 10/21/23 0834  HGB 9.6*  --   --   --   --   --  9.0*  --  8.7*  --   HCT 30.4*  --   --   --   --   --  28.7*  --  26.1*  --   PLT 497*  --   --   --   --   --  427*  --  368  --   APTT  --   --   --  37*  --   --   --   --   --   --   LABPROT 24.6*  --   --   --   --   --  26.3*  --   --  24.5*  INR 2.2*  --   --   --   --   --  2.4*  --   --  2.2*  HEPARINUNFRC  --   --   --   --   --  <0.10* <0.10*  --   --   --   CREATININE 0.71  --   --   --   --   --  0.79  --  0.83  --   TROPONINIHS 408*   < > 449*  --  518*  --   --  505*  --   --    < > = values in this interval not displayed.   Estimated Creatinine Clearance: 94.9 mL/min (by C-G formula based on SCr of 0.83 mg/dL).  Medical History: Past Medical History:  Diagnosis Date   Hypertension    Medications:  PTA Warfarin - recent new start for mechanical mitral  valve replacement  Last dose: 2.5 mg on 3/28 AM  Home regimen: 2.5 mg daily for 2 days, then 5 mg daily - then repeat this 3 day schedule   Assessment: 53 year old female that presented from home with sharp chest pain that began yesterday. Elevated troponins in the 400s. Recently underwent a mitral valve replacement on 10/07/2023 and was initiated on warfarin on 10/09/2023. Patient took last 2.5 mg warfarin dose yesterday morning. Pharmacy has been consulted for initiation and management of warfarin for MVR and afib.   DDI: amio (increase INR post 2 weeks of therapy), duloxtine (increase risk of bleeding), aspirin (increase risk of bleeding)   Goal of Therapy:  INR goal: 2.5 - 3.5  Monitor platelets by anticoagulation protocol: Yes  Date INR Warfarin Dose  3/28 -- 2.5 mg   3/29 2.2  2.5 mg   3/30 2.4 3 mg  3/31 2.2 4 mg      Warfarin plan:  INR is subtherapeutic and trending down. Hopefully with the 3 mg dose yesterday, INR will trend up tomorrow. Will give warfarin 4 mg x 1 tonight (~60% increase from home dose). Being slightly aggressive to get INR to order ASA and we have room to work with. Daily INR. CBC at least every 3 days. Heparin infusion switched to enoxaparin 1mg /kg Bid. Continue enoxaparin bridge until INR > 2.5 x 24 hours.     Ronnald Ramp, PharmD, BCPS 10/21/2023 9:32 AM

## 2023-10-21 NOTE — Progress Notes (Signed)
 Heart Failure Stewardship Pharmacy Note  PCP: Ronnald Ramp, MD PCP-Cardiologist: Julien Nordmann, MD  HPI: Sandra Hines is a 53 y.o. female with essential hypertension and cerebral aneurysm s/p repair, mechanical mitral valve, tobacco use, dyslipidemia, atrial fibrillation, who presented with . On admission, BNP was 16[address], HS-troponin was 408 > 490 > 449, and INR was 2.2. Chest x-ray noted possible edema vs infection.   Pertinent Cardiac History: Echocardiogram 09/26/23 showed LVEF 45-50% with mild LVH, severely dilated LA, mildly reduced RV function, severe MR, mild-moderate TR. Repeat echocardiogram 10/01/23 after diuresis showed persistent MR. Transferred to [facility] and underwent mechanical mitral valve replacement on 10/07/23. Discharged 10/14/23 on aspirin, warfarin, amiodarone, and BiDil. Echo this admission pending.  Pertinent Lab Values: Creatinine  Date Value Ref Range Status  08/15/2014 0.80 0.60 - 1.30 mg/dL Final   Creatinine, Ser  Date Value Ref Range Status  10/21/2023 0.83 0.44 - 1.00 mg/dL Final   BUN  Date Value Ref Range Status  10/21/2023 13 6 - 20 mg/dL Final  60/45/4098 16 6 - 24 mg/dL Final  11/91/4782 10 7 - 18 mg/dL Final   Potassium  Date Value Ref Range Status  10/21/2023 3.8 3.5 - 5.1 mmol/L Final  08/15/2014 3.8 3.5 - 5.1 mmol/L Final   Sodium  Date Value Ref Range Status  10/21/2023 132 (L) 135 - 145 mmol/L Final  07/19/2023 139 134 - 144 mmol/L Final  08/15/2014 136 136 - 145 mmol/L Final   B Natriuretic Peptide  Date Value Ref Range Status  10/19/2023 1,629.6 (H) 0.0 - 100.0 pg/mL Final    Comment:    Performed at [facility], [address] [place], [place] [address]   Magnesium  Date Value Ref Range Status  10/08/2023 2.2 1.7 - 2.4 mg/dL Final    Comment:    Performed at [facility], [address], [place], [place] [address]   Hgb A1c MFr Bld  Date Value Ref Range Status  07/19/2023 5.8 (H) 4.8  - 5.6 % Final    Comment:             Prediabetes: 5.7 - 6.4          Diabetes: >6.4          Glycemic control for adults with diabetes: <7.0    TSH  Date Value Ref Range Status  09/25/2023 1.091 0.350 - 4.500 uIU/mL Final    Comment:    Performed by a 3rd Generation assay with a functional sensitivity of <=0.01 uIU/mL. Performed at [facility], [address] [place], [place] [address]   07/19/2023 0.406 (L) 0.450 - 4.500 uIU/mL Final    Vital Signs: Admission weight: 215 lbs Temp:  [97.8 F (36.6 C)-99.3 F (37.4 C)] 98.5 F (36.9 C) (03/31 0513) Pulse Rate:  [55-80] 55 (03/31 0513) Cardiac Rhythm: Heart block (03/30 2001) Resp:  [13-26] 19 (03/31 0513) BP: (106-156)/(69-111) 108/74 (03/31 0513) SpO2:  [94 %-100 %] 100 % (03/31 0513) Weight:  [92.9 kg (204 lb 12.9 oz)] 92.9 kg (204 lb 12.9 oz) (03/31 0500)  Intake/Output Summary (Last 24 hours) at 10/21/2023 0750 Last data filed at 10/21/2023 0410 Gross per 24 hour  Intake 964.17 ml  Output 2000 ml  Net -1035.83 ml    Current Heart Failure Medications:  Loop diuretic: furosemide 40 mg IV BID Beta-Blocker: none ACEI/ARB/ARNI: losartan 12.5 mg daily MRA: none SGLT2i: none Other: BiDil 20-37.5 mg 1 tablet TID  Prior to admission Heart  Failure Medications:  Loop diuretic: none Beta-Blocker: none ACEI/ARB/ARNI: none MRA: none SGLT2i: none Other: BiDil 20-37.5 mg 0.5 tablet TID  Assessment: 1. Acute systolic heart failure (LVEF 40-45%) with mildly reduced RV function and severe MR, due to unknown etiology. NYHA class III symptoms.  -Symptoms: Patient reports shortness of breath and swelling are much improved from admission. Chest pain is post-surgical pain per patient report.  -Volume: Admit weight was 215 lbs, dry weight per patient report is ~200-202 lbs. Symptoms have improved. Creatinine is stable. Would continue furosemide 40 mg IV BID today. Will need at least prn furosemide at  discharge. -Hemodynamics: BP is generally elevated. HR is 50s. EKG on admission in sinus rhythm. Currently on amiodarone. -SGLT2i: Consider starting Jardiance 10 mg daily. Prior authorization approved for no cost. No recent UTI history. -MRA: Consider starting spironolactone today.  -ARNI: Consider transitioning to [facility] prior to discharge. Can stop BiDil to allow BP room. -BiDil does not benefit diastolic CHF and is difficult to take due to frequency. Given renal function is stable  Plan: 1) Medication changes recommended at this time: -Will sign off now that advanced HF is consulted. Please reach out with any questions or concerns.  2) Patient assistance: -Sherryll Burger and London Pepper are $0. Patient previously had a $7500 dollar deductible, however this was either met with her hospital admission or resolved when her Medicaid was updated.    3) Education: - Patient has been educated on current HF medications and potential additions to HF medication regimen - Patient verbalizes understanding that over the next few months, these medication doses may change and more medications may be added to optimize HF regimen - Patient has been educated on basic disease state pathophysiology and goals of therapy  Medication Assistance / Insurance Benefits Check: Does the patient have prescription insurance?    Type of insurance plan:  Does the patient qualify for medication assistance through manufacturers or grants? Pending   Outpatient Pharmacy[facility]      Please do not hesitate to reach out with questions or concerns,  Enos Fling, PharmD, CPP, BCPS Heart Failure Pharmacist  Phone - [phone] 10/21/2023 7:50 AM

## 2023-10-21 NOTE — Plan of Care (Signed)

## 2023-10-21 NOTE — Telephone Encounter (Signed)
 Pharmacy Patient Advocate Encounter  Insurance verification completed.    The patient is insured through [employer]. Patient has [employer], may use a copay card, and/or apply for patient assistance if available.    Ran test claim for ENTRESTO 15-16MG  CAPSULES and the current 30 day co-pay is 0.00.  Ran test claim for Farxiga 10mg  and the drug is not covered by the insurance.  Ran test claim for Jardiance 10mg  and a prior authorization is required.  This test claim was processed through [facility]- copay amounts may vary at other pharmacies due to pharmacy/plan contracts, or as the patient moves through the different stages of their insurance plan.

## 2023-10-21 NOTE — Progress Notes (Addendum)
 Progress Note   Patient: Sandra Hines AVW:[id number] DOB: [birth year] DOA: 10/19/2023     1 DOS: the patient was seen and examined on 10/21/2023   Brief hospital course: [name] is a 53 y.o. 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 Patient is not on Lasix at home.  Admitted to [facility] service for NSTEMI, acute on chronic diastolic heart failure.   Assessment and Plan:  # NSTEMI (non-ST elevated myocardial infarction) (HCC) Troponin 500s in the setting of acute CHF exacerbation EKG unremarkable.  Stop heparin drip. Continue Coumadin with Lovenox in setting of recent mitral valve replacement. INR 2.4. discussed with pharmacy. Trend troponin. Low suspicion for ACS given pre op cath 10/03/23 negative.  2D echo pending. Cardiology follow up appreciated.  Acute on chronic systolic heart failure   Acute dyspnea, orthopnea PND, positive weight gain in the setting of a recent mitral valve replacement Previous 2D echo earlier this month with a EF of 50 to 55% BNP high at 1600. Chest x-ray with bilateral opacities with concern for edema versus infection. CT chest possible bilateral peripheral ground glass opacities. Left pleural effusion. Status post 60 mg of IV Lasix in the ER. S/p diuresis iv Lasix 40mg  bid and Lasix 80 mg IV x 1 dose on 3/31 Cardiology recommended no more IV diuresis,  started Lasix 20 mg p.o. daily from tomorrow, started Entresto 24-26 mg, Jardiance 10 mg.  Stop bidil. Optimize GDMT for at least 3 months. Repeat ECHO  Echo: LVEF down to 25-30% and RV severely reduced from previous 45-50%. TEE EF 55-60% . Suspect EF down from recent valve surgery.   Atrial fibrillation (HCC) New onset diagnosis of atrial fibrillation during March 13 of March 24 admission on the cardiology service. EKG sinus rhythm with first-degree block on evaluation today Continue home amiodarone, anticoagulation with Coumadin+lovenox bridging.  Leukocytosis White count 25 on presentation. Suspect multifactorial with recent mitral valve placement, recent pneumonia prior admission. CT chest shows  bilateral peripheral ground glass opacities.  Continue Rocephin and Azithromycin. Monitor daily CBC.  Isotonic Hyponatremia- Stable, chronic.  Sodium 132 Serum osmolality 276, WNL  Severe mitral valve regurgitation Status post mitral valve replacement March 17. Continue coumadin-with Lovenox bridging.  Monitor INR, INR goal 2.5-3  Dyslipidemia Cont statin   Primary hypertension BP stable  Continue Bidil.  Obesity class 1- BMI 33.67 Diet, weight reduction advised.  Vitamin D deficiency: started vitamin D 50,000 units p.o. weekly, follow with PCP to repeat vitamin D level after 3 to 6 months.  Iron deficiency anemia, transferrin saturation 8% Venofer IV 300 mg x 2 doses given followed by oral supplement at discharge.      Out of bed to chair. Incentive spirometry. Nursing supportive care. Fall, aspiration precautions. Diet:  Diet Orders (From admission, onward)     Start     Ordered   10/19/23 1057  Diet heart healthy/carb modified Room service appropriate? Yes; Fluid consistency: Thin; Fluid restriction: 1200 mL Fluid  Diet effective now  Question Answer Comment  Diet-HS Snack? Nothing   Room service appropriate? Yes   Fluid consistency: Thin   Fluid  restriction: 1200 mL Fluid      10/19/23 1057           DVT prophylaxis:  warfarin (COUMADIN) tablet 4 mg  Level of care: Telemetry Cardiac   Code Status: Full Code  Subjective: Patient is seen and examined today morning. She is sitting on edge of bed. Mild short of breath. Chest pain more with breathing difficulty. Wished to go home, understands she need IV diuresis.  Physical Exam: Vitals:   10/21/23 0500 10/21/23 0513 10/21/23 0929 10/21/23 1307  BP:  108/74 129/80 115/75  Pulse:  (!) 55 77 77  Resp:  19    Temp:  98.5 F (36.9 C) 98.1 F (36.7 C) 98 F (36.7 C)  TempSrc:  Oral    SpO2:  100% 97% 98%  Weight: 92.9 kg     Height:        General -  Middle aged African American female, mild respiratory distress HEENT - PERRLA, EOMI, atraumatic head, non tender sinuses. Lung - Clear, basal rales, no rhonchi, wheezes. Heart - S1, S2 heard, no murmurs, rubs, midline scar noted on chest, no pedal edema (Resolved) Abdomen - Soft, non tender, bowel sounds good Neuro - Alert, awake and oriented x 3, non focal exam. Skin - Warm and dry.  Data Reviewed:      Latest Ref Rng & Units 10/21/2023    3:46 AM 10/20/2023    2:16 AM 10/19/2023    7:41 AM  CBC  WBC 4.0 - 10.5 K/uL 13.3  20.7  25.0   Hemoglobin 12.0 - 15.0 g/dL 8.7  9.0  9.6   Hematocrit 36.0 - 46.0 % 26.1  28.7  30.4   Platelets 150 - 400 K/uL 368  427  497       Latest Ref Rng & Units 10/21/2023    3:46 AM 10/20/2023    2:16 AM 10/19/2023    7:41 AM  BMP  Glucose 70 - 99 mg/dL 75  80  161   BUN 6 - 20 mg/dL 13  16  15    Creatinine 0.44 - 1.00 mg/dL 0.96  0.45  4.09   Sodium 135 - 145 mmol/L 132  134  133   Potassium 3.5 - 5.1 mmol/L 3.8  3.7  4.0   Chloride 98 - 111 mmol/L 96  96  99   CO2 22 - 32 mmol/L 25  26  26    Calcium 8.9 - 10.3 mg/dL 8.1  7.9  8.7    ECHOCARDIOGRAM COMPLETE Result Date: 10/21/2023    ECHOCARDIOGRAM REPORT   Patient Name:   Sandra Hines Date of Exam: 10/21/2023 Medical Rec #:   [id number]    Height:       67.0 in Accession #:    [id number]   Weight:       204.8 lb Date of Birth:  1970/12/26   BSA:          2.043 m Patient Age:    53 years     BP:           108/74 mmHg Patient Gender: F            HR:           55 bpm. Exam Location:  [facility] Procedure: 2D Echo, Cardiac Doppler, Color Doppler, Strain Analysis and 3D Echo            (  Both Spectral and Color Flow Doppler were utilized during            procedure). Indications:     CHF-acute systolic I50.21  History:         Patient has prior history of Echocardiogram examinations, most                  recent 10/01/2023. Risk Factors:Hypertension.  Sonographer:     Cristela Blue Referring Phys:  [phone] Francoise Schaumann [place] Diagnosing Phys: Yvonne Kendall MD  Sonographer Comments: Global longitudinal strain was attempted. IMPRESSIONS  1. Left ventricular ejection fraction, by estimation, is 25 to 30%. Left ventricular ejection fraction by 3D volume is 18 %. The left ventricle has severely decreased function. The left ventricle demonstrates global hypokinesis. The left ventricular internal cavity size was mildly dilated. There is moderate left ventricular hypertrophy. Left ventricular diastolic function could not be evaluated. The average left ventricular global longitudinal strain is -5.0 %. The global longitudinal strain is abnormal.  2. Right ventricular systolic function is severely reduced. The right ventricular size is mildly enlarged. Tricuspid regurgitation signal is inadequate for assessing PA pressure.  3. Left atrial size was severely dilated.  4. The mitral valve has been repaired/replaced. Trivial mitral valve regurgitation. The mean mitral valve gradient is 11.0 mmHg.  5. The aortic valve has an indeterminant number of cusps. Aortic valve regurgitation is mild to moderate.  6. The inferior vena cava is normal in size with greater than 50% respiratory variability, suggesting right atrial pressure of 3 mmHg. FINDINGS  Left Ventricle: Left  ventricular ejection fraction, by estimation, is 25 to 30%. Left ventricular ejection fraction by 3D volume is 18 %. The left ventricle has severely decreased function. The left ventricle demonstrates global hypokinesis. The average  left ventricular global longitudinal strain is -5.0 %. Strain was performed and the global longitudinal strain is abnormal. The left ventricular internal cavity size was mildly dilated. There is moderate left ventricular hypertrophy. Abnormal (paradoxical) septal motion, consistent with left bundle branch block. Left ventricular diastolic function could not be evaluated due to mitral valve replacement. Left ventricular diastolic function could not be evaluated. Right Ventricle: The right ventricular size is mildly enlarged. No increase in right ventricular wall thickness. Right ventricular systolic function is severely reduced. Tricuspid regurgitation signal is inadequate for assessing PA pressure. Left Atrium: Left atrial size was severely dilated. Right Atrium: Right atrial size was normal in size. Pericardium: Trivial pericardial effusion is present. Mitral Valve: The mitral valve has been repaired/replaced. Trivial mitral valve regurgitation. There is a 29 mm [employer] mechanical valve present in the mitral position. MV peak gradient, 19.4 mmHg. The mean mitral valve gradient is 11.0 mmHg. Tricuspid Valve: The tricuspid valve is not well visualized. Tricuspid valve regurgitation is trivial. Aortic Valve: The aortic valve has an indeterminant number of cusps. Aortic valve regurgitation is mild to moderate. Aortic valve mean gradient measures 5.0 mmHg. Aortic valve peak gradient measures 8.8 mmHg. Aortic valve area, by VTI measures 2.70 cm. Pulmonic Valve: The pulmonic valve was normal in structure. Pulmonic valve regurgitation is mild. No evidence of pulmonic stenosis. Aorta: The aortic root is normal in size and structure. Pulmonary Artery: The pulmonary artery is of normal size.  Venous: The inferior vena cava is normal in size with greater than 50% respiratory variability, suggesting right atrial pressure of 3 mmHg. IAS/Shunts: The interatrial septum was not well visualized. Additional Comments: 3D was performed not requiring image post processing on an independent  workstation and was abnormal. There is a small pleural effusion in the left lateral region.  LEFT VENTRICLE PLAX 2D LVIDd:         5.30 cm         Diastology LVIDs:         4.10 cm         LV e' medial:   8.05 cm/s LV PW:         1.60 cm         LV E/e' medial: 23.5 LV IVS:        1.60 cm LVOT diam:     2.00 cm         2D Longitudinal LV SV:         48              Strain LV SV Index:   23              2D Strain GLS   -5.0 % LVOT Area:     3.14 cm        Avg:                                 3D Volume EF LV Volumes (MOD)               LV 3D EF:    Left LV vol d, MOD    136.0 ml                   ventricul A2C:                                        ar LV vol d, MOD    141.0 ml                   ejection A4C:                                        fraction LV vol s, MOD    72.2 ml                    by 3D A2C:                                        volume is LV vol s, MOD    111.0 ml                   18 %. A4C: LV SV MOD A2C:   63.8 ml LV SV MOD A4C:   141.0 ml      3D Volume EF: LV SV MOD BP:    47.3 ml       3D EF:        18 % RIGHT VENTRICLE RV Basal diam:  5.00 cm RV Mid diam:    2.50 cm RV S prime:     8.59 cm/s TAPSE (M-mode): 1.3 cm LEFT ATRIUM              Index        RIGHT ATRIUM           Index  LA diam:        3.80 cm  1.86 cm/m   RA Area:     18.80 cm LA Vol (A2C):   96.7 ml  47.34 ml/m  RA Volume:   52.00 ml  25.46 ml/m LA Vol (A4C):   117.0 ml 57.28 ml/m LA Biplane Vol: 109.0 ml 53.36 ml/m  AORTIC VALVE                     PULMONIC VALVE AV Area (Vmax):    2.38 cm      PR End Diast Vel: 12.39 msec AV Area (Vmean):   2.36 cm AV Area (VTI):     2.70 cm AV Vmax:           148.00 cm/s AV Vmean:          102.000  cm/s AV VTI:            0.177 m AV Peak Grad:      8.8 mmHg AV Mean Grad:      5.0 mmHg LVOT Vmax:         112.00 cm/s LVOT Vmean:        76.500 cm/s LVOT VTI:          0.152 m LVOT/AV VTI ratio: 0.86  AORTA Ao Root diam: 3.40 cm MITRAL VALVE MV Area (PHT): 3.68 cm     SHUNTS MV Area VTI:   1.48 cm     Systemic VTI:  0.15 m MV Peak grad:  19.4 mmHg    Systemic Diam: 2.00 cm MV Mean grad:  11.0 mmHg MV Vmax:       2.20 m/s MV Vmean:      155.0 cm/s MV Decel Time: 206 msec MV E velocity: 189.00 cm/s Yvonne Kendall MD Electronically signed by Yvonne Kendall MD Signature Date/Time: 10/21/2023/10:29:14 AM    Final     Family Communication: Discussed with patient, understand and agree. All questions answered.  Disposition: Status is: Changed to inpatient Remains inpatient appropriate because: IV lasix, anticoagulation.  Planned Discharge Destination: Home with Home Health  MDM level 3- She is admitted with NSTEMI, acute CHF exacerbation status post mitral replacement, PVI maze 2 weeks ago.  She is on IV Lasix, anticoagulation, IV antibiotics, supplemental oxygen.  She will need close hemodynamic, electrolyte, telemetry monitoring.  Patient is at high risk for sudden clinical deterioration.  Total time spent: 55 minutes  Author: Gillis Santa, MD 10/21/2023 2:05 PM Secure chat 7am to 7pm For on call review [email or web address].

## 2023-10-22 ENCOUNTER — Other Ambulatory Visit: Payer: Self-pay

## 2023-10-22 ENCOUNTER — Telehealth (HOSPITAL_COMMUNITY): Payer: Self-pay | Admitting: Pharmacy Technician

## 2023-10-22 ENCOUNTER — Other Ambulatory Visit (HOSPITAL_COMMUNITY): Payer: Self-pay

## 2023-10-22 DIAGNOSIS — I214 Non-ST elevation (NSTEMI) myocardial infarction: Secondary | ICD-10-CM | POA: Diagnosis not present

## 2023-10-22 LAB — PROTIME-INR
INR: 2.3 — ABNORMAL HIGH (ref 0.8–1.2)
Prothrombin Time: 25.9 s — ABNORMAL HIGH (ref 11.4–15.2)

## 2023-10-22 LAB — COMPREHENSIVE METABOLIC PANEL WITH GFR
ALT: 41 U/L (ref 0–44)
AST: 23 U/L (ref 15–41)
Albumin: 2.8 g/dL — ABNORMAL LOW (ref 3.5–5.0)
Alkaline Phosphatase: 76 U/L (ref 38–126)
Anion gap: 9 (ref 5–15)
BUN: 13 mg/dL (ref 6–20)
CO2: 24 mmol/L (ref 22–32)
Calcium: 8.3 mg/dL — ABNORMAL LOW (ref 8.9–10.3)
Chloride: 99 mmol/L (ref 98–111)
Creatinine, Ser: 0.75 mg/dL (ref 0.44–1.00)
GFR, Estimated: >60 mL/min (ref >60)
Glucose, Bld: 104 mg/dL — ABNORMAL HIGH (ref 70–99)
Potassium: 4.1 mmol/L (ref 3.5–5.1)
Sodium: 132 mmol/L — ABNORMAL LOW (ref 135–145)
Total Bilirubin: 0.5 mg/dL (ref 0.0–1.2)
Total Protein: 6.7 g/dL (ref 6.5–8.1)

## 2023-10-22 LAB — LEGIONELLA PNEUMOPHILA SEROGP 1 UR AG: L. pneumophila Serogp 1 Ur Ag: NEGATIVE

## 2023-10-22 LAB — CBC
HCT: 32.4 % — ABNORMAL LOW (ref 36.0–46.0)
Hemoglobin: 10.5 g/dL — ABNORMAL LOW (ref 12.0–15.0)
MCH: 27.1 pg (ref 26.0–34.0)
MCHC: 32.4 g/dL (ref 30.0–36.0)
MCV: 83.5 fL (ref 80.0–100.0)
Platelets: 355 10*3/uL (ref 150–400)
RBC: 3.88 MIL/uL (ref 3.87–5.11)
RDW: 15.8 % — ABNORMAL HIGH (ref 11.5–15.5)
WBC: 11.9 10*3/uL — ABNORMAL HIGH (ref 4.0–10.5)
nRBC: 0 % (ref 0.0–0.2)

## 2023-10-22 LAB — TROPONIN I (HIGH SENSITIVITY)
Troponin I (High Sensitivity): 316 ng/L (ref <18)
Troponin I (High Sensitivity): 286 ng/L (ref <18)

## 2023-10-22 LAB — MAGNESIUM: Magnesium: 2.3 mg/dL (ref 1.7–2.4)

## 2023-10-22 LAB — PHOSPHORUS: Phosphorus: 3.5 mg/dL (ref 2.5–4.6)

## 2023-10-22 MED ORDER — WARFARIN SODIUM 4 MG PO TABS: 4.0000 mg | ORAL_TABLET | Freq: Once | ORAL | Status: DC

## 2023-10-22 MED ORDER — TORSEMIDE 20 MG PO TABS
20.0000 mg | ORAL_TABLET | Freq: Every day | ORAL | Status: DC
  Administered 2023-10-22: 20 mg via ORAL
  Filled 2023-10-22: qty 1

## 2023-10-22 MED ORDER — BISACODYL 10 MG RE SUPP: 10.0000 mg | Freq: Every day | RECTAL | Status: DC | PRN

## 2023-10-22 MED ORDER — POLYETHYLENE GLYCOL 3350 17 G PO PACK
17.0000 g | PACK | Freq: Every day | ORAL | Status: DC
  Administered 2023-10-22: 17 g via ORAL
  Filled 2023-10-22: qty 1

## 2023-10-22 MED ORDER — ENOXAPARIN SODIUM 100 MG/ML IJ SOSY
100.0000 mg | PREFILLED_SYRINGE | Freq: Two times a day (BID) | INTRAMUSCULAR | 0 refills | Status: DC
  Filled 2023-10-22: qty 14, 7d supply, fill #0

## 2023-10-22 MED ORDER — EMPAGLIFLOZIN 10 MG PO TABS
10.0000 mg | ORAL_TABLET | Freq: Every day | ORAL | 11 refills | Status: DC
  Filled 2023-10-22: qty 30, 30d supply, fill #0
  Filled 2023-11-13 – 2023-11-15 (×2): qty 30, 30d supply, fill #1
  Filled 2023-12-17: qty 30, 30d supply, fill #2
  Filled 2024-01-20: qty 30, 30d supply, fill #3
  Filled 2024-03-25: qty 30, 30d supply, fill #4

## 2023-10-22 MED ORDER — CARVEDILOL 3.125 MG PO TABS
3.1250 mg | ORAL_TABLET | Freq: Two times a day (BID) | ORAL | 11 refills | Status: DC
  Filled 2023-10-22: qty 60, 30d supply, fill #0
  Filled 2023-11-13 – 2023-11-15 (×2): qty 60, 30d supply, fill #1
  Filled 2023-12-17: qty 60, 30d supply, fill #2
  Filled 2024-01-20: qty 60, 30d supply, fill #3
  Filled 2024-03-25: qty 60, 30d supply, fill #4
  Filled 2024-05-05: qty 60, 30d supply, fill #5
  Filled 2024-06-06 (×2): qty 60, 30d supply, fill #6

## 2023-10-22 MED ORDER — FE FUM-VIT C-VIT B12-FA 460-60-0.01-1 MG PO CAPS
1.0000 | ORAL_CAPSULE | Freq: Every day | ORAL | 5 refills | Status: AC
  Filled 2023-10-22: qty 30, 30d supply, fill #0

## 2023-10-22 MED ORDER — CEFDINIR 300 MG PO CAPS
300.0000 mg | ORAL_CAPSULE | Freq: Two times a day (BID) | ORAL | 0 refills | Status: AC
  Filled 2023-10-22: qty 10, 5d supply, fill #0

## 2023-10-22 MED ORDER — BISACODYL 5 MG PO TBEC: 10.0000 mg | DELAYED_RELEASE_TABLET | Freq: Every day | ORAL | Status: DC | PRN

## 2023-10-22 MED ORDER — VITAMIN D (ERGOCALCIFEROL) 1.25 MG (50000 UNIT) PO CAPS
50000.0000 [IU] | ORAL_CAPSULE | ORAL | 0 refills | Status: AC
  Filled 2023-10-22: qty 4, 28d supply, fill #0
  Filled 2023-11-13: qty 4, 28d supply, fill #1
  Filled 2023-12-09 – 2023-12-19 (×2): qty 4, 28d supply, fill #2

## 2023-10-22 MED ORDER — SACUBITRIL-VALSARTAN 24-26 MG PO TABS
1.0000 | ORAL_TABLET | Freq: Two times a day (BID) | ORAL | 11 refills | Status: DC
  Filled 2023-10-22: qty 60, 30d supply, fill #0
  Filled 2023-11-13 – 2023-11-15 (×2): qty 60, 30d supply, fill #1
  Filled 2023-12-17: qty 60, 30d supply, fill #2
  Filled 2024-01-20: qty 60, 30d supply, fill #3
  Filled 2024-03-25: qty 60, 30d supply, fill #4
  Filled 2024-05-05: qty 60, 30d supply, fill #5
  Filled 2024-06-06 (×2): qty 60, 30d supply, fill #6

## 2023-10-22 MED ORDER — POLYETHYLENE GLYCOL 3350 17 GM/SCOOP PO POWD
17.0000 g | Freq: Every day | ORAL | 0 refills | Status: AC
  Filled 2023-10-22: qty 238, 14d supply, fill #0

## 2023-10-22 MED ORDER — SPIRONOLACTONE 12.5 MG HALF TABLET
12.5000 mg | ORAL_TABLET | Freq: Every day | ORAL | Status: DC
  Administered 2023-10-22: 12.5 mg via ORAL
  Filled 2023-10-22: qty 1

## 2023-10-22 MED ORDER — OXYCODONE HCL 5 MG PO TABS
5.0000 mg | ORAL_TABLET | Freq: Three times a day (TID) | ORAL | 0 refills | Status: AC | PRN
  Filled 2023-10-22: qty 15, 5d supply, fill #0

## 2023-10-22 MED ORDER — AZITHROMYCIN 250 MG PO TABS
500.0000 mg | ORAL_TABLET | Freq: Every evening | ORAL | Status: DC
Start: 2023-10-22 — End: 2023-10-22

## 2023-10-22 MED ORDER — TORSEMIDE 20 MG PO TABS
20.0000 mg | ORAL_TABLET | Freq: Every day | ORAL | 11 refills | Status: DC
  Filled 2023-10-22: qty 30, 30d supply, fill #0
  Filled 2023-11-13 – 2023-11-15 (×2): qty 30, 30d supply, fill #1
  Filled 2023-12-09: qty 30, 30d supply, fill #2
  Filled 2024-01-14: qty 30, 30d supply, fill #3
  Filled 2024-03-11: qty 30, 30d supply, fill #4
  Filled 2024-04-25: qty 30, 30d supply, fill #5
  Filled 2024-06-06 (×2): qty 30, 30d supply, fill #6

## 2023-10-22 MED ORDER — SPIRONOLACTONE 25 MG PO TABS
12.5000 mg | ORAL_TABLET | Freq: Every day | ORAL | 11 refills | Status: DC
  Filled 2023-10-22: qty 15, 30d supply, fill #0
  Filled 2023-11-13 – 2023-11-15 (×2): qty 15, 30d supply, fill #1
  Filled 2023-12-09 – 2023-12-19 (×2): qty 15, 30d supply, fill #2
  Filled 2024-01-20: qty 15, 30d supply, fill #3

## 2023-10-22 MED ORDER — WARFARIN SODIUM 5 MG PO TABS
5.0000 mg | ORAL_TABLET | Freq: Once | ORAL | Status: AC
  Administered 2023-10-22: 5 mg via ORAL
  Filled 2023-10-22: qty 1

## 2023-10-22 MED ORDER — VITAMIN B-12 1000 MCG PO TABS
500.0000 ug | ORAL_TABLET | Freq: Every day | ORAL | Status: DC
  Administered 2023-10-22: 500 ug via ORAL
  Filled 2023-10-22: qty 1

## 2023-10-22 MED ORDER — CARVEDILOL 3.125 MG PO TABS
3.1250 mg | ORAL_TABLET | Freq: Two times a day (BID) | ORAL | Status: DC
  Administered 2023-10-22: 3.125 mg via ORAL
  Filled 2023-10-22: qty 1

## 2023-10-22 MED ORDER — ENOXAPARIN SODIUM 100 MG/ML IJ SOSY
1.0000 mg/kg | PREFILLED_SYRINGE | Freq: Two times a day (BID) | INTRAMUSCULAR | Status: DC
  Administered 2023-10-22: 92.5 mg via SUBCUTANEOUS
  Filled 2023-10-22: qty 1

## 2023-10-22 MED ORDER — IRON SUCROSE 300 MG IVPB - SIMPLE MED
300.0000 mg | Freq: Once | Status: AC
  Administered 2023-10-22: 300 mg via INTRAVENOUS
  Filled 2023-10-22: qty 265

## 2023-10-22 NOTE — Consult Note (Addendum)
 PHARMACY - ANTICOAGULATION CONSULT NOTE  Pharmacy Consult for Enoxaparin/Warfarin  Indication: Mechanical Mitral Valve Replacement/Atrial Fibrillation   Allergies  Allergen Reactions   Penicillins Hives    Has patient had a PCN reaction causing immediate rash, facial/tongue/throat swelling, SOB or lightheadedness with hypotension: No Has patient had a PCN reaction causing severe rash involving mucus membranes or skin necrosis: No Has patient had a PCN reaction that required hospitalization: No Has patient had a PCN reaction occurring within the last 10 years: No If all of the above answers are "NO", then may proceed with Cephalosporin use.    Patient Measurements: Height: 5\' 7"  (170.2 cm) Weight: 93.2 kg (205 lb 7.5 oz) IBW/kg (Calculated) : 61.6 HEPARIN DW (KG): 81.8  Vital Signs: Temp: 98.3 F (36.8 C) (04/01 0716) BP: 124/85 (04/01 0716) Pulse Rate: 80 (04/01 0716)  Labs: Recent Labs    10/19/23 1147 10/19/23 1309 10/19/23 1827 10/20/23 0216 10/20/23 0216 10/20/23 0426 10/21/23 0346 10/21/23 0834 10/22/23 0526  HGB  --   --   --  9.0*   < >  --  8.7*  --  10.5*  HCT  --   --   --  28.7*  --   --  26.1*  --  32.4*  PLT  --   --   --  427*  --   --  368  --  355  APTT 37*  --   --   --   --   --   --   --   --   LABPROT  --   --   --  26.3*  --   --   --  24.5* 25.9*  INR  --   --   --  2.4*  --   --   --  2.2* 2.3*  HEPARINUNFRC  --   --  <0.10* <0.10*  --   --   --   --   --   CREATININE  --   --   --  0.79  --   --  0.83  --  0.75  TROPONINIHS  --  518*  --   --   --  505*  --   --  316*   < > = values in this interval not displayed.   Estimated Creatinine Clearance: 98.5 mL/min (by C-G formula based on SCr of 0.75 mg/dL).  Medical History: Past Medical History:  Diagnosis Date   Hypertension    Medications:  PTA Warfarin - recent new start for mechanical mitral valve replacement  Last dose: 2.5 mg on 3/28 AM  Home regimen: 2.5 mg daily for 2 days, then  5 mg daily - then repeat this 3 day schedule   Assessment: 53 year old female that presented from home with sharp chest pain that began yesterday. Elevated troponins in the 400s. Recently underwent a mitral valve replacement on 10/07/2023 and was initiated on warfarin on 10/09/2023. Patient took last 2.5 mg warfarin dose yesterday morning. Pharmacy has been consulted for initiation and management of warfarin for MVR and afib.   DDI: amio (increase INR post 2 weeks of therapy), duloxtine (increase risk of bleeding), aspirin (increase risk of bleeding)   Goal of Therapy:  INR goal: 2.5 - 3.5  Monitor platelets by anticoagulation protocol: Yes  Date INR Warfarin Dose  3/28 -- 2.5 mg   3/29 2.2  2.5 mg   3/30 2.4 3 mg  3/31 2.2 4 mg   4/1 2.3 5  mg         Warfarin plan:  INR remains below goal and not significantly improved. CBC stable with improved Hgb and stable plt count. Will order warfarin 5mg  po tonight and follow up witl AM labs. Continue enoxaparin bridge until INR > 2.5  Daily INR and CBC at least every 3 days.  Sandra Hines PharmD, BCPS 10/22/2023 8:02 AM

## 2023-10-22 NOTE — Progress Notes (Signed)
 Phlebotomy notified regarding stat Troponin. Phlebotomist notified, states she was not aware of the stat ordered. Lab being drawn now.

## 2023-10-22 NOTE — Plan of Care (Addendum)
       CROSS COVER NOTE  NAME: Sandra Hines MRN: [id number] DOB : 07/13/1971    Concern as stated by nurse / staff   Dx: Audry Pili, Hx: HTN, pAF, s/p mech MV replacement with MAZE on 10/07/23. Pt c/o of chest pain, 7/10. Described as throbbing. I medicated her with morphine 2mg  IV and will reassess. Do you want anything else such as an EKG?      Pertinent findings on chart review: Per last progress note, recent negative cath on 3/13 as follows: # NSTEMI (non-ST elevated myocardial infarction) (HCC) Troponin 500s in the setting of acute CHF exacerbation EKG unremarkable.  Stop heparin drip. Continue Coumadin with Lovenox in setting of recent mitral valve replacement. INR 2.4. discussed with pharmacy. Trend troponin. Low suspicion for ACS given pre op cath 10/03/23 negative.  2D echo pending.    10/22/2023    3:17 AM 10/21/2023   11:08 PM 10/21/2023    8:18 PM  Vitals with BMI  Systolic 116 114 638  Diastolic 81 75 80  Pulse 73 76 72      Assessment and  Interventions   Assessment:  Chest pain with recent negative cath type ll NSTEMI 2/2 CHF  Plan: morphine Trop and eKG---> trop 316, down from 505 X

## 2023-10-22 NOTE — Discharge Summary (Signed)
 [facility] Discharge Summary   Patient: Sandra Hines YQM:[id number]  PCP: Ronnald Ramp, MD  Date of admission: 10/19/2023   Date of discharge:  10/22/2023     Discharge Diagnoses:  Principal Problem:   NSTEMI (non-ST elevated myocardial infarction) [facility]) Active Problems:   Acute CHF (congestive heart failure) (HCC)   Acute on chronic heart failure with preserved ejection fraction (HFpEF) (HCC)   Atrial fibrillation (HCC)   Moderate tobacco dependence   Primary hypertension   Dyslipidemia   Severe mitral valve regurgitation   Leukocytosis   Acute HFrEF (heart failure with reduced ejection fraction) (HCC)   Admitted From: Home Disposition:  Home   Recommendations for Outpatient Follow-up:  Follow-up with PCP in 1 week, continue to monitor INR and continue Coumadin to keep INR 2.5-3.5, continue Lovenox to bridge Coumadin until INR is therapeutic Follow-up with cardiology in 1 week Follow up LABS/TEST:  INR   Follow-up Information     Bensimhon, Bevelyn Buckles, MD. Go on 10/29/2023.   Specialty: Cardiology Why: Hospital Follow-Up 10/29/23 @ 9:15 AM Please bring all medications to follow-up appointment [facility], [address], Second Floor Free Valet Parking @ the door Contact information: [address] [place] [address] [phone]         Ronnald Ramp, MD Follow up in 1 week(s).   Specialty: Family Medicine Why: Hospital Follow-Up appointment: 11/05/2023 10 AM. Please bring insurance card, photo ID and all medications. Contact information: [address] [place] [address] [phone]                Diet recommendation: Cardiac diet  Activity: The patient is advised to gradually reintroduce usual activities, as tolerated  Discharge Condition: stable  Code Status: Full code   History of present illness: As per the H and P dictated on admission  Hospital Course:  Sandra Hines is  a 53 y.o. 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 Patient is not on Lasix at home.  Admitted to [facility] service for NSTEMI, acute on chronic diastolic heart failure.    Assessment and Plan:   # NSTEMI (non-ST elevated myocardial infarction) Troponin 500s in the setting of acute CHF exacerbation EKG unremarkable. Stopped heparin drip. Continue Coumadin with Lovenox in setting of recent mitral valve replacement. INR 2.4. discussed with pharmacy. Trend troponin. Low suspicion for ACS given pre op cath 10/03/23 negative.  2D echo low EF as below.  Seen by cardiology.  # Acute on chronic systolic heart failure   Acute dyspnea, orthopnea PND, positive weight gain in the setting of a recent mitral valve replacement Previous 2D echo earlier this month with a EF of 50 to 55% BNP high at 1600. Chest x-ray with bilateral opacities with concern for edema versus infection. CT chest possible bilateral peripheral ground glass opacities. Left pleural effusion. Echo: LVEF down to 25-30% and RV severely reduced from previous 45-50%. TEE EF 55-60% . Suspect EF down from recent valve surgery.  S/p  60 mg  of IV Lasix in the ER. S/p diuresis iv Lasix 40mg  bid and Lasix 80 mg IV x 1 dose on 3/31 Cardiology recommended no more IV diuresis, started torsemide 20 mg p.o. daily from today.  Started Entresto 24-26 mg,  Jardiance 10 mg.  Stop bidil. Optimize GDMT for at least 3 months and then repeat 2D echocardiogram as an outpatient.  Patient was cleared by cardiology to discharge and follow-up as an outpatient in 1 to 2 weeks.   # Paroxysmal atrial fibrillation: New onset diagnosis of atrial fibrillation during March 13 of March 24 admission on the cardiology service. EKG sinus rhythm with first-degree block Continue home amiodarone, anticoagulation with Coumadin+lovenox bridging.   # Community-acquired pneumonia. POA Leukocyte gnosis WBC count 25 on presentation. Suspect multifactorial with recent mitral valve placement, recent pneumonia prior admission. CT chest shows  bilateral peripheral ground glass opacities. S/p Rocephin and Azithromycin given during hospital stay.  Discharged on Omnicef 300 mg p.o. twice daily for 5 days. WBC count trending down.  Clinically no symptoms of pneumonia.    # Isotonic Hyponatremia- Stable, chronic.  Sodium 132 Serum osmolality 276, WNL, follow-up with PCP to repeat BMP after 1 week.  # Severe mitral valve regurgitation: s/p Mitral valve replacement March 17. Continue coumadin-with Lovenox bridging. Monitor INR, INR goal 2.5-3  # Dyslipidemia: Cont statin   # Primary hypertension: Discontinued Bidil.  Started Coreg 3.125 mg p.o. twice daily, Entresto 24-26 mg.  Twice daily, Aldactone 12.5 mg p.o. daily, torsemide 20 mg p.o. daily Monitor BP at home and follow with PCP to titrate medications accordingly.   # Vitamin D deficiency: started vitamin D 50,000 units p.o. weekly, follow with PCP to repeat vitamin D level after 3 to 6 months.   # Iron deficiency anemia, transferrin saturation 8% Venofer IV 300 mg x 2 doses given.  Continue Trigel     Obesity class I Body mass index is 32.18 kg/m.  Nutrition Interventions:  Pain control  - [place] Controlled Substance Reporting System database could not be reviewed as website was not working. -Oxycodone 5 mg  p.o. 3 times daily as needed 5-day supply given for pain control. - Patient was instructed, not to drive, operate heavy machinery, perform activities at heights, swimming or participation in water activities or provide baby sitting services while on Pain, Sleep and Anxiety Medications; until her outpatient Physician has advised to do so again.  - Also recommended to not to take more than prescribed Pain, Sleep and Anxiety Medications.  Patient was ambulatory without any assistance. On the day of the discharge the patient's vitals were stable, and no other acute medical condition were reported by patient. the patient was felt safe to be discharge at Home.  Consultants: Cardiology Procedures: None  Discharge Exam: General: Appear in no distress, no Rash; Oral Mucosa Clear, moist. Cardiovascular: S1 and S2 Present, audible murmur, clicks due to metallic mitral valve Respiratory: normal respiratory effort, Bilateral Air entry present and no Crackles, no wheezes Abdomen: Bowel Sound present, Soft and no tenderness, no hernia Extremities: no Pedal edema, no calf tenderness Neurology: alert and oriented to time, place, and person affect appropriate.  Filed Weights   10/20/23 1944 10/21/23 0500 10/22/23 0500  Weight: 92.9 kg 92.9 kg 93.2 kg   Vitals:   10/22/23 0716 10/22/23 1125  BP: 124/85 122/70  Pulse: 80 69  Resp: 18 18  Temp: 98.3 F (36.8 C) (!) 97.5 F (36.4 C)  SpO2: 93% 96%    DISCHARGE  MEDICATION: Allergies as of 10/22/2023       Reactions   Penicillins Hives   Has patient had a PCN reaction causing immediate rash, facial/tongue/throat swelling, SOB or lightheadedness with hypotension: No Has patient had a PCN reaction causing severe rash involving mucus membranes or skin necrosis: No Has patient had a PCN reaction that required hospitalization: No Has patient had a PCN reaction occurring within the last 10 years: No If all of the above answers are "NO", then may proceed  with Cephalosporin use.        Medication List     STOP taking these medications    isosorbide-hydrALAZINE 20-37.5 MG tablet Commonly known as: BIDIL       TAKE these medications    acetaminophen 325 MG tablet Commonly known as: TYLENOL Take 2 tablets (650 mg total) by mouth every 4 (four) hours as needed for headache or mild pain (pain score 1-3).   amiodarone 200 MG tablet Commonly known as: PACERONE Take 2 tablets (400 mg total) by mouth 2 (two) times daily. For 5 days, then 400 mg daily for 5 days, then 200 mg daily   aspirin 81 MG chewable tablet Chew 1 tablet (81 mg total) by mouth daily.   busPIRone 5 MG tablet Commonly known as: BUSPAR Take 1 tablet (5 mg total) by mouth 3 (three) times daily.   carvedilol 3.125 MG tablet Commonly known as: COREG Take 1 tablet (3.125 mg total) by mouth 2 (two) times daily with a meal.   cefdinir 300 MG capsule Commonly known as: OMNICEF Take 1 capsule (300 mg total) by mouth 2 (two) times daily for 5 days.   DULoxetine 20 MG capsule Commonly known as: Cymbalta Take 2 capsules (40 mg total) by mouth daily.   enoxaparin 100 MG/ML injection Commonly known as: LOVENOX Inject 1 mL (100 mg total) into the skin 2 (two) times daily as directed by Coumadin clinic   Entresto 24-26 MG Generic drug: sacubitril-valsartan Take 1 tablet by mouth 2 (two) times daily.   Fe Fum-Vit C-Vit B12-FA Caps capsule Commonly known as: TRIGELS-F FORTE Take 1 capsule by mouth daily.   ipratropium-albuterol 0.5-2.5 (3) MG/3ML Soln Commonly known as: DUONEB Take 3 mLs by nebulization every 2 (two) hours as needed (shortness of breath).   Jardiance 10 MG Tabs tablet Generic drug: empagliflozin Take 1 tablet (10 mg total) by mouth daily. Start taking on: October 23, 2023   magnesium hydroxide 400 MG/5ML suspension Commonly known as: MILK OF MAGNESIA Take 30 mLs by mouth daily as needed for mild constipation or moderate constipation.    oxyCODONE 5 MG immediate release tablet Commonly known as: Oxy IR/ROXICODONE Take 1 tablet (5 mg total) by mouth 3 (three) times daily as needed for up to 5 days for severe pain (pain score 7-10). What changed: when to take this   polyethylene glycol powder 17 GM/SCOOP powder Commonly known as: GLYCOLAX/MIRALAX Take 17 g by mouth daily. Mix as directed. Start taking on: October 23, 2023   rosuvastatin 40 MG tablet Commonly known as: CRESTOR Take 1 tablet (40 mg total) by mouth daily.   spironolactone 25 MG tablet Commonly known as: ALDACTONE Take 0.5 tablets (12.5 mg total) by mouth daily. Start taking on: October 23, 2023   torsemide 20 MG tablet Commonly known as: DEMADEX Take 1 tablet (20 mg total) by mouth daily. Start taking on: October 23, 2023   Vitamin D (Ergocalciferol) 1.25 MG (50000 UNIT) Caps capsule Commonly known as: DRISDOL Take  1 capsule (50,000 Units total) by mouth every 7 (seven) days. Start taking on: October 28, 2023   warfarin 2.5 MG tablet Commonly known as: COUMADIN Take 1 tablet (2.5 mg total) by mouth as directed. Alternating schedule every 3 days, take 2.5 mg daily for 2 days, then 5 mg daily- then repeat 3 day cycle. Then per coumadin clinic       Allergies  Allergen Reactions   Penicillins Hives    Has patient had a PCN reaction causing immediate rash, facial/tongue/throat swelling, SOB or lightheadedness with hypotension: No Has patient had a PCN reaction causing severe rash involving mucus membranes or skin necrosis: No Has patient had a PCN reaction that required hospitalization: No Has patient had a PCN reaction occurring within the last 10 years: No If all of the above answers are "NO", then may proceed with Cephalosporin use.    Discharge Instructions     Call MD for:   Complete by: As directed    Chest pain or palpitations, or lower extremity edema.   Call MD for:  difficulty breathing, headache or visual disturbances   Complete by: As  directed    Call MD for:  extreme fatigue   Complete by: As directed    Call MD for:  persistant dizziness or light-headedness   Complete by: As directed    Call MD for:  severe uncontrolled pain   Complete by: As directed    Diet - low sodium heart healthy   Complete by: As directed    Discharge instructions   Complete by: As directed    Follow-up with PCP in 1 week, continue to monitor INR and continue Coumadin to keep INR 2.5-3.5, continue Lovenox to bridge Coumadin until INR is therapeutic Follow-up with cardiology in 1 week   Increase activity slowly   Complete by: As directed    No wound care   Complete by: As directed        The results of significant diagnostics from this hospitalization (including imaging, microbiology, ancillary and laboratory) are listed below for reference.    Significant Diagnostic Studies: ECHOCARDIOGRAM COMPLETE Result Date: 10/21/2023    ECHOCARDIOGRAM REPORT   Patient Name:   MAGDALYN ARENIVAS Date of Exam: 10/21/2023 Medical Rec #:  [id number]    Height:       67.0 in Accession #:    [id number]   Weight:       204.8 lb Date of Birth:  Jan 06, 1971   BSA:          2.043 m Patient Age:    52 years     BP:           108/74 mmHg Patient Gender: F            HR:           55 bpm. Exam Location:  [facility] Procedure: 2D Echo, Cardiac Doppler, Color Doppler, Strain Analysis and 3D Echo            (Both Spectral and Color Flow Doppler were utilized during            procedure). Indications:     CHF-acute systolic I50.21  History:         Patient has prior history of Echocardiogram examinations, most                  recent 10/01/2023. Risk Factors:Hypertension.  Sonographer:     Cristela Blue Referring Phys:  [phone] STEVEN J NEWTON Diagnosing Phys:  Yvonne Kendall MD  Sonographer Comments: Global longitudinal strain was attempted. IMPRESSIONS  1. Left ventricular ejection fraction, by estimation, is 25 to 30%. Left ventricular ejection fraction by 3D volume is 18 %. The left  ventricle has severely decreased function. The left ventricle demonstrates global hypokinesis. The left ventricular internal cavity size was mildly dilated. There is moderate left ventricular hypertrophy. Left ventricular diastolic function could not be evaluated. The average left ventricular global longitudinal strain is -5.0 %. The global longitudinal strain is abnormal.  2. Right ventricular systolic function is severely reduced. The right ventricular size is mildly enlarged. Tricuspid regurgitation signal is inadequate for assessing PA pressure.  3. Left atrial size was severely dilated.  4. The mitral valve has been repaired/replaced. Trivial mitral valve regurgitation. The mean mitral valve gradient is 11.0 mmHg.  5. The aortic valve has an indeterminant number of cusps. Aortic valve regurgitation is mild to moderate.  6. The inferior vena cava is normal in size with greater than 50% respiratory variability, suggesting right atrial pressure of 3 mmHg. FINDINGS  Left Ventricle: Left ventricular ejection fraction, by estimation, is 25 to 30%. Left ventricular ejection fraction by 3D volume is 18 %. The left ventricle has severely decreased function. The left ventricle demonstrates global hypokinesis. The average  left ventricular global longitudinal strain is -5.0 %. Strain was performed and the global longitudinal strain is abnormal. The left ventricular internal cavity size was mildly dilated. There is moderate left ventricular hypertrophy. Abnormal (paradoxical) septal motion, consistent with left bundle branch block. Left ventricular diastolic function could not be evaluated due to mitral valve replacement. Left ventricular diastolic function could not be evaluated. Right Ventricle: The right ventricular size is mildly enlarged. No increase in right ventricular wall thickness. Right ventricular systolic function is severely reduced. Tricuspid regurgitation signal is inadequate for assessing PA pressure. Left  Atrium: Left atrial size was severely dilated. Right Atrium: Right atrial size was normal in size. Pericardium: Trivial pericardial effusion is present. Mitral Valve: The mitral valve has been repaired/replaced. Trivial mitral valve regurgitation. There is a 29 mm [employer] mechanical valve present in the mitral position. MV peak gradient, 19.4 mmHg. The mean mitral valve gradient is 11.0 mmHg. Tricuspid Valve: The tricuspid valve is not well visualized. Tricuspid valve regurgitation is trivial. Aortic Valve: The aortic valve has an indeterminant number of cusps. Aortic valve regurgitation is mild to moderate. Aortic valve mean gradient measures 5.0 mmHg. Aortic valve peak gradient measures 8.8 mmHg. Aortic valve area, by VTI measures 2.70 cm. Pulmonic Valve: The pulmonic valve was normal in structure. Pulmonic valve regurgitation is mild. No evidence of pulmonic stenosis. Aorta: The aortic root is normal in size and structure. Pulmonary Artery: The pulmonary artery is of normal size. Venous: The inferior vena cava is normal in size with greater than 50% respiratory variability, suggesting right atrial pressure of 3 mmHg. IAS/Shunts: The interatrial septum was not well visualized. Additional Comments: 3D was performed not requiring image post processing on an independent workstation and was abnormal. There is a small pleural effusion in the left lateral region.  LEFT VENTRICLE PLAX 2D LVIDd:         5.30 cm         Diastology LVIDs:         4.10 cm         LV e' medial:   8.05 cm/s LV PW:         1.60 cm  LV E/e' medial: 23.5 LV IVS:        1.60 cm LVOT diam:     2.00 cm         2D Longitudinal LV SV:         48              Strain LV SV Index:   23              2D Strain GLS   -5.0 % LVOT Area:     3.14 cm        Avg:                                 3D Volume EF LV Volumes (MOD)               LV 3D EF:    Left LV vol d, MOD    136.0 ml                   ventricul A2C:                                         ar LV vol d, MOD    141.0 ml                   ejection A4C:                                        fraction LV vol s, MOD    72.2 ml                    by 3D A2C:                                        volume is LV vol s, MOD    111.0 ml                   18 %. A4C: LV SV MOD A2C:   63.8 ml LV SV MOD A4C:   141.0 ml      3D Volume EF: LV SV MOD BP:    47.3 ml       3D EF:        18 % RIGHT VENTRICLE RV Basal diam:  5.00 cm RV Mid diam:    2.50 cm RV S prime:     8.59 cm/s TAPSE (M-mode): 1.3 cm LEFT ATRIUM              Index        RIGHT ATRIUM           Index LA diam:        3.80 cm  1.86 cm/m   RA Area:     18.80 cm LA Vol (A2C):   96.7 ml  47.34 ml/m  RA Volume:   52.00 ml  25.46 ml/m LA Vol (A4C):   117.0 ml 57.28 ml/m LA Biplane Vol: 109.0 ml 53.36 ml/m  AORTIC VALVE                     PULMONIC VALVE  AV Area (Vmax):    2.38 cm      PR End Diast Vel: 12.39 msec AV Area (Vmean):   2.36 cm AV Area (VTI):     2.70 cm AV Vmax:           148.00 cm/s AV Vmean:          102.000 cm/s AV VTI:            0.177 m AV Peak Grad:      8.8 mmHg AV Mean Grad:      5.0 mmHg LVOT Vmax:         112.00 cm/s LVOT Vmean:        76.500 cm/s LVOT VTI:          0.152 m LVOT/AV VTI ratio: 0.86  AORTA Ao Root diam: 3.40 cm MITRAL VALVE MV Area (PHT): 3.68 cm     SHUNTS MV Area VTI:   1.48 cm     Systemic VTI:  0.15 m MV Peak grad:  19.4 mmHg    Systemic Diam: 2.00 cm MV Mean grad:  11.0 mmHg MV Vmax:       2.20 m/s MV Vmean:      155.0 cm/s MV Decel Time: 206 msec MV E velocity: 189.00 cm/s Yvonne Kendall MD Electronically signed by Yvonne Kendall MD Signature Date/Time: 10/21/2023/10:29:14 AM    Final    CT CHEST WO CONTRAST Result Date: 10/19/2023 CLINICAL DATA:  Respiratory illness, nondiagnostic xray. EXAM: CT CHEST WITHOUT CONTRAST TECHNIQUE: Multidetector CT imaging of the chest was performed following the standard protocol without IV contrast. RADIATION DOSE REDUCTION: This exam was performed according to the  departmental dose-optimization program which includes automated exposure control, adjustment of the mA and/or kV according to patient size and/or use of iterative reconstruction technique. COMPARISON:  CT angiography chest from 09/26/2023. FINDINGS: Cardiovascular: Mild cardiomegaly. Since the prior study, patient underwent median sternotomy with prosthetic mitral valve and left atrial appendage closure. There is small amount of hyperattenuating fluid along the anterior pericardial surface and small focus of air, within normal limits for recent surgery. No discrete walled-off abscess seen. No aortic aneurysm. There are mild peripheral atherosclerotic vascular calcifications of thoracic aorta and its major branches. Mediastinum/Nodes: Redemonstration of an approximately 1.5 x 1.6 cm hypoattenuating nodule in the right inferior thyroid lobe, incompletely characterized on the current exam but meeting the size criteria for follow-up nonemergent thyroid ultrasound evaluation. The esophagus is nondistended precluding optimal assessment. No mediastinal or axillary lymphadenopathy by size criteria. Evaluation of bilateral hila is limited due to lack on intravenous contrast: however, no large hilar lymphadenopathy identified. Lungs/Pleura: The central tracheo-bronchial tree is patent. Since the prior study, there is increasing small-to-moderate left pleural effusion with associated segmental compressive atelectatic changes in the left lung lower lobe. There is a lesion of previously seen peripheral solid opacities throughout bilateral lungs. However, there are new predominantly peripheral ground-glass opacities throughout bilateral lungs with asymmetric more involvement of the right lung upper lobe. Findings are nonspecific and may represent sequela of infection (including atypical pneumonia) or inflammation. Interval resolution of previously seen small intra lobular and interlobular septal thickening. Multiple subcentimeter  sized predominantly calcified and few noncalcified nodules are again seen, mainly in the right lung lower lobe and middle lobe without interval change. No suspicious lung nodule. No right pleural effusion. No pneumothorax on either side. Upper Abdomen: Redemonstration of peripherally rim calcified gallstone without imaging signs of acute cholecystitis. There are multiple subcentimeter calcified granulomas in  the spleen, nonspecific but commonly seen as a sequela of prior granulomatous infection. Remaining visualized upper abdominal viscera within normal limits. Musculoskeletal: The visualized soft tissues of the chest wall are grossly unremarkable. No suspicious osseous lesions. There are mild to moderate multilevel degenerative changes in the visualized spine. IMPRESSION: 1. Since the prior study, there is new small-to-moderate left pleural effusion with associated segmental compressive atelectatic changes in the left lung lower lobe. 2. There is interval resolution of previously seen peripheral solid opacities throughout bilateral lungs. However, there are new predominantly peripheral ground-glass opacities throughout bilateral lungs with asymmetric more involvement of the right lung upper lobe. Findings are nonspecific and may represent sequela of infection (including atypical pneumonia) or inflammation. 3. Postsurgical changes from recent open-heart surgery, as described above. 4. Multiple other nonacute observations, as described above. Aortic Atherosclerosis (ICD10-I70.0). Electronically Signed   By: Jules Schick M.D.   On: 10/19/2023 12:54   DG Chest Port 1 View Result Date: 10/19/2023 CLINICAL DATA:  [id number] Chest pain [id number] EXAM: PORTABLE CHEST 1 VIEW COMPARISON:  10/11/2023 FINDINGS: Stable cardiomegaly status post sternotomy, mitral valve replacement, and left atrial appendage clipping. Persistent left basilar opacity. Probable small left pleural effusion. Band-like atelectasis in the left mid lung.  Mild right perihilar interstitial opacity. No right-sided pleural effusion. No pneumothorax. IMPRESSION: 1. Persistent left basilar opacity, atelectasis versus pneumonia. Probable small left pleural effusion. 2. Mild right perihilar interstitial opacity, which may reflect edema versus infection. Electronically Signed   By: Duanne Guess D.O.   On: 10/19/2023 08:32   DG Chest 2 View Result Date: 10/11/2023 CLINICAL DATA:  [id number] S/P MVR (mitral valve replacement) [id number]. EXAM: CHEST - 2 VIEW COMPARISON:  10/09/2023. FINDINGS: Low lung volume. Re-demonstration of left retrocardiac airspace opacity obscuring the left hemidiaphragm, descending thoracic aorta and blunting the left lateral costophrenic angle, suggesting combination of left lung atelectasis and/or consolidation with pleural effusion. There is probable mild interval progression since the prior study. Right lung and right lateral costophrenic angle are grossly clear. No pneumothorax on either side. Stable cardio-mediastinal silhouette. Stable widening of the superior mediastinum. Left atrial appendage closure device and prosthetic mitral valve noted. There are sternotomy wires. No acute osseous abnormalities. The soft tissues are within normal limits. IMPRESSION: Mild interval progression of left retrocardiac opacity, as described above. Electronically Signed   By: Jules Schick M.D.   On: 10/11/2023 07:58   DG Chest Port 1 View Result Date: 10/09/2023 CLINICAL DATA:  Status post mitral valve repair. EXAM: PORTABLE CHEST 1 VIEW COMPARISON:  Chest radiograph dated 10/08/2023. FINDINGS: Interval removal of the Swan-Ganz catheter. Right IJ catheter with tip over central SVC. Mediastinal drain in similar position. Stable cardiac silhouette. Similar degree vascular congestion. Slight improved aeration of the lungs. Left lung base atelectasis. Small bilateral pleural effusions. No pneumothorax. Median sternotomy wires. Mitral valve replacement. No acute  osseous pathology. IMPRESSION: 1. Slight improved aeration of the lungs. 2. Small bilateral pleural effusions. Electronically Signed   By: Elgie Collard M.D.   On: 10/09/2023 09:56   DG Chest Port 1 View Result Date: 10/08/2023 CLINICAL DATA:  Status post mitral valve replacement. EXAM: PORTABLE CHEST 1 VIEW COMPARISON:  Chest radiograph dated 10/07/2023. FINDINGS: Interval removal of endotracheal and enteric tubes. Right IJ Swan-Ganz catheter with tip over the right hilum. Cardiomegaly with vascular congestion slightly progressed since the prior radiograph. Small bilateral pleural effusions suspected. No pneumothorax. Median sternotomy wires and mitral bowel repair. No acute osseous pathology. IMPRESSION: Cardiomegaly with vascular  congestion and small bilateral pleural effusions. Electronically Signed   By: Elgie Collard M.D.   On: 10/08/2023 09:16   DG Chest Port 1 View Result Date: 10/07/2023 CLINICAL DATA:  Status post mitral valve replacement EXAM: PORTABLE CHEST 1 VIEW COMPARISON:  X-ray 09/28/2023 and older FINDINGS: Status post median sternotomy. Prosthetic mitral valve. Atrial occlusion clip. Numerous tubes and lines. ET tube seen with tip proximally 4 cm above the carina. Mediastinal drains. Enteric tube extends beneath the diaphragm. Right IJ Swan-Ganz catheter tip overlying the right pulmonary artery. Enlarged cardiopericardial silhouette with some central vascular congestion. No pneumothorax or effusion. Underinflation. Overlapping cardiac leads. IMPRESSION: Acute surgical changes of mitral valve repair. Sternal wires. Numerous tubes and lines. Underinflation with enlarged heart and vascular congestion. No pneumothorax. Electronically Signed   By: Karen Kays M.D.   On: 10/07/2023 13:36   ECHO INTRAOPERATIVE TEE Result Date: 10/07/2023  *INTRAOPERATIVE TRANSESOPHAGEAL REPORT *  Patient Name:   KAYE LUOMA   Date of Exam: 10/07/2023 Medical Rec #:  [id number]      Height:       67.0 in  Accession #:    [id number]     Weight:       195.3 lb Date of Birth:  06-19-1971     BSA:          2.00 m Patient Age:    52 years       BP:           118/72 mmHg Patient Gender: F              HR:           56 bpm. Exam Location:  Anesthesiology Transesophogeal exam was perform intraoperatively during surgical procedure. Patient was closely monitored under general anesthesia during the entirety of examination. Indications:     Rheumatic mitral insufficiency Sonographer:     Irving Burton Senior RDCS Performing Phys: Anice Paganini DO Diagnosing Phys: Anice Paganini DO PROCEDURE: Intraoperative Transesophogeal 3D imaging performed to better assess valves. Complications: No known complications during this procedure. POST-OP IMPRESSIONS _ Left Ventricle: The left ventricular function is normal without inotropic support. _ Right Ventricle: The right ventricular function is mildly reduced without inotropic support. _ Aorta: The aorta appears unchanged from pre-bypass. There is no dissection. _ Left Atrial Appendage: The left atrial appendage appears appropriately occluded. There is no residual flow visualized with color flow doppler. _ Aortic Valve: The aortic valve appears unchanged from pre-bypass. Trace AI. _ Mitral Valve: There is a mechanical valve in the mitral position. The valve is well seated with no paravalvular leak. Trace intravalvular regurgitation jets seen consistant with normal washing jets. Mean pressure gradient 5 mmHg. _ Tricuspid Valve: The tricuspid valve appears unchanged from pre-bypass. Trace TR. _ Pulmonic Valve: The pulmonic valve appears unchanged from pre-bypass. Trace PI. _ Pericardium: There is no pericardial effusion. PRE-OP FINDINGS  Left Ventricle: The left ventricle has normal systolic function, with an ejection fraction of 55-60%. The cavity size was mildly dilated. Right Ventricle: The right ventricle has mildly reduced systolic function. The cavity was moderately dialated. Left Atrium: No left  atrial/left atrial appendage thrombus was detected.  Interatrial Septum: No atrial level shunt detected by color flow Doppler. There is no evidence of a patent foramen ovale. Pericardium: There is no evidence of pericardial effusion. There is no pleural effusion. Mitral Valve: The mitral valve is rheumatic. Significant restriction of both the posterior and anterior mitral leaflets. Copatation point well below annulus,  with notable coaptation defect. Multiple regurgitation jets. Mitral valve regurgitation is severe by color flow Doppler. Tricuspid Valve: The tricuspid valve was normal in structure. Tricuspid valve regurgitation is trivial by color flow Doppler and limitted to PA catheter site. Tricuspid annulus is dilated to 4.42cm Aortic Valve: The aortic valve is tricuspid. Aortic valve regurgitation is trivial by color flow Doppler. There is no stenosis of the aortic valve, with a calculated valve area of 2.03 cm. Pulmonic Valve: The pulmonic valve was normal in structure. Pulmonic valve regurgitation is trivial by color flow Doppler. Aorta: Ascending aorta measures 3.9cm in diameter. Aortic root measures 3.7cm +--------------+--------++ LEFT VENTRICLE          +----------------+---------++ +--------------+--------++  Diastology                PLAX 2D                 +----------------+---------++ +--------------+--------++  LV e' lateral:  8.97 cm/s LVOT diam:    2.20 cm   +----------------+---------++ +--------------+--------++  LV E/e' lateral:10.3      LVOT Area:    3.80 cm  +----------------+---------++ +--------------+--------++                        +--------------+--------++ +---------------+------+-------+ RIGHT VENTRICLE              +---------------+------+-------+ TAPSE (M-mode):1.6 cm2.37 cm +---------------+------+-------+ +------------------+------------++ AORTIC VALVE                   +------------------+------------++ AV Area (Vmax):   2.12 cm      +------------------+------------++ AV Area (Vmean):  2.12 cm     +------------------+------------++ AV Area (VTI):    2.03 cm     +------------------+------------++ AV Vmax:          160.50 cm/s  +------------------+------------++ AV Vmean:         100.550 cm/s +------------------+------------++ AV VTI:           0.269 m      +------------------+------------++ AV Peak Grad:     10.3 mmHg    +------------------+------------++ AV Mean Grad:     5.5 mmHg     +------------------+------------++ LVOT Vmax:        89.65 cm/s   +------------------+------------++ LVOT Vmean:       56.000 cm/s  +------------------+------------++ LVOT VTI:         0.144 m      +------------------+------------++ LVOT/AV VTI ratio:0.54         +------------------+------------++ AR PHT:           624 msec     +------------------+------------++  +--------------+-------++ AORTA                 +--------------+-------++ Ao Sinus diam:3.05 cm +--------------+-------++ Ao STJ diam:  2.9 cm  +--------------+-------++ +--------------+----------++     +---------------+-----------++ MITRAL VALVE                 TRICUSPID VALVE            +--------------+----------++     +---------------+-----------++ MV Area (PHT):2.24 cm       TR Peak grad:  12.8 mmHg   +--------------+----------++     +---------------+-----------++ MV Peak grad: 13.0 mmHg      TR Vmax:       179.00 cm/s +--------------+----------++     +---------------+-----------++ MV Mean grad: 5.0 mmHg   +--------------+----------++     +--------------+-------+ MV Vmax:      1.81 m/s  SHUNTS                +--------------+----------++     +--------------+-------+ MV Vmean:     104.0 cm/s     Systemic VTI: 0.14 m  +--------------+----------++     +--------------+-------+ MV VTI:       0.39 m         Systemic Diam:2.20 cm +--------------+----------++     +--------------+-------+ MV  PHT:       98.02 msec +--------------+----------++ MV Decel Time:338 msec   +--------------+----------++ +----------------+-----------++ MR Peak grad:   91.4 mmHg   +----------------+-----------++ MR Mean grad:   53.0 mmHg   +----------------+-----------++ MR Vmax:        478.00 cm/s +----------------+-----------++ MR Vmean:       340.5 cm/s  +----------------+-----------++ MR PISA:        2.26 cm    +----------------+-----------++ MR PISA Eff ROA:16 mm      +----------------+-----------++ MR PISA Radius: 0.60 cm     +----------------+-----------++ +--------------+----------++ MV E velocity:92.00 cm/s +--------------+----------++ MV A velocity:24.70 cm/s +--------------+----------++ MV E/A ratio: 3.72       +--------------+----------++  Anice Paganini Electronically signed by Anice Paganini Signature Date/Time: 10/07/2023/1:22:15 PM    Final    VAS US CAROTID Result Date: 10/06/2023 Carotid Arterial Duplex Study Patient Name:  LORREE MILLAR  Date of Exam:   10/05/2023 Medical Rec #: [id number]     Accession #:    [id number] Date of Birth: [birth year]    Patient Gender: F Patient Age:   31 years Exam Location:  [facility] Procedure:      VAS US CAROTID Referring Phys: Eugenio Hoes --------------------------------------------------------------------------------  Indications:       Pre operative mitral valve replacement, pulmonary vein                    isolation maze procedure and 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 Other Factors:     Atrial fibrillation, CHF. Comparison Study:  No prior study on file Performing Technologist: Sherren Kerns RVS  Examination Guidelines: A complete evaluation includes B-mode imaging, spectral Doppler, color Doppler, and power Doppler as needed of all accessible portions of each vessel. Bilateral testing is considered an integral part of a complete  examination. Limited examinations for reoccurring indications may be performed as noted.  Right Carotid Findings: +----------+--------+--------+--------+------------------+--------+           PSV cm/sEDV cm/sStenosisPlaque DescriptionComments +----------+--------+--------+--------+------------------+--------+ CCA Prox  69      12              heterogenous               +----------+--------+--------+--------+------------------+--------+ CCA Distal71      12              heterogenous               +----------+--------+--------+--------+------------------+--------+ ICA Prox  71      17              homogeneous                +----------+--------+--------+--------+------------------+--------+ ICA Mid   36      14                                         +----------+--------+--------+--------+------------------+--------+  ICA Distal52      19                                         +----------+--------+--------+--------+------------------+--------+ ECA       96      12                                         +----------+--------+--------+--------+------------------+--------+ +----------+--------+-------+--------+-------------------+           PSV cm/sEDV cmsDescribeArm Pressure (mmHG) +----------+--------+-------+--------+-------------------+ [id number]                                        +----------+--------+-------+--------+-------------------+ +---------+--------+--+--------+--+ VertebralPSV cm/s44EDV cm/s15 +---------+--------+--+--------+--+  Left Carotid Findings: +----------+--------+--------+--------+------------------+------------------+           PSV cm/sEDV cm/sStenosisPlaque DescriptionComments           +----------+--------+--------+--------+------------------+------------------+ CCA Prox  98      23                                intimal thickening  +----------+--------+--------+--------+------------------+------------------+ CCA Distal72      21              homogeneous                          +----------+--------+--------+--------+------------------+------------------+ ICA Prox  39      14              homogeneous                          +----------+--------+--------+--------+------------------+------------------+ ICA Mid   38      17                                                   +----------+--------+--------+--------+------------------+------------------+ ICA Distal44      15                                tortuous           +----------+--------+--------+--------+------------------+------------------+ ECA       90      14                                                   +----------+--------+--------+--------+------------------+------------------+ +----------+--------+--------+--------+-------------------+           PSV cm/sEDV cm/sDescribeArm Pressure (mmHG) +----------+--------+--------+--------+-------------------+ [id number]                                         +----------+--------+--------+--------+-------------------+ +---------+--------+--+--------+--+ VertebralPSV cm/s87EDV cm/s23 +---------+--------+--+--------+--+   Summary: Right Carotid: The extracranial vessels were near-normal with only minimal wall  thickening or plaque. Left Carotid: The extracranial vessels were near-normal with only minimal wall               thickening or plaque. Vertebrals:  Bilateral vertebral arteries demonstrate antegrade flow. Subclavians: Normal flow hemodynamics were seen in bilateral subclavian              arteries. *See table(s) above for measurements and observations.  Electronically signed by Heath Lark on 10/06/2023 at 2:06:33 PM.    Final    CARDIAC CATHETERIZATION Result Date: 10/03/2023 Conclusions: No angiographically significant atherosclerotic coronary artery disease.   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 Moderately elevated left heart, right heart, and pulmonary artery pressures.  Large v-waves observed on PCWP tracing consistent with the patient's severe mitral regurgitation. Low normal to mildly reduced Fick cardiac output/index. Recommendations: Escalate diuresis and consider consultation with advanced heart failure team for optimization of acute diastolic heart failure in the setting of severe mitral valve regurgitation. Cardiac surgery consultation for mitral valve replacement.  Response to medical therapy will dictate if this can be done as an outpatient or if the patient will need transfer to [facility] for inpatient evaluation. Resume heparin infusion 2 hours after TR band removal. Primary prevention of coronary artery disease. Yvonne Kendall, MD [facility]  ECHO TEE Result Date: 10/03/2023    TRANSESOPHOGEAL ECHO REPORT   Patient Name:   KEBRA LOWRIMORE Date of Exam: 10/02/2023 Medical Rec #:  [id number]    Height:       67.0 in Accession #:    [id number]   Weight:       197.8 lb Date of Birth:  Jun 10, 1971   BSA:          2.013 m Patient Age:    52 years     BP:           121/88 mmHg Patient Gender: F            HR:           59 bpm. Exam Location:  [facility] Procedure: 3D Echo, Transesophageal Echo, Cardiac Doppler, Color Doppler and            Saline Contrast Bubble Study (Both Spectral and Color Flow Doppler            were utilized during procedure). Indications:     Severe Mitral valve regurgitation  History:         Patient has prior history of Echocardiogram examinations, most                  recent 10/01/2023. Risk Factors:Hypertension.  Sonographer:     Cristela Blue Referring Phys:  [id number] [facility] CAREY Diagnosing Phys: Julien Nordmann MD PROCEDURE: After discussion of the risks and benefits of a TEE, an informed consent was obtained from the patient. TEE procedure time was 30 minutes. The transesophogeal probe was passed without difficulty through  the esophogus of the patient. Imaged were obtained with the patient in a left lateral decubitus position. Local oropharyngeal anesthetic was provided with Cetacaine and viscous lidocaine. Sedation performed by different physician. The patient was monitored while under deep sedation. Image quality was excellent. The patient's vital signs; including heart rate, blood pressure, and oxygen saturation; remained stable throughout the procedure. The patient developed no complications during the procedure.  IMPRESSIONS  1. Left ventricular ejection fraction, by estimation, is 55 to 60%. The left ventricle has normal function. The left ventricle has no regional wall  motion abnormalities.  2. Right ventricular systolic function is low normal. The right ventricular size is normal.  3. The mitral valve is rheumatic, both anterior and posterior leaflets involved. Severe mitral valve regurgitation, central and eccentric jet noted. No evidence of mitral stenosis.  4. The aortic valve is normal in structure. Aortic valve regurgitation is not visualized. No aortic stenosis is present.  5. The inferior vena cava is normal in size with greater than 50% respiratory variability, suggesting right atrial pressure of 3 mmHg.  6. No left atrial/left atrial appendage thrombus was detected.  7. 3D performed of the mitral valve and demonstrates rheumatic mitral valve. Conclusion(s)/Recommendation(s): hemodynamically significant mitral valvular heart disease as detailed above. FINDINGS  Left Ventricle: Left ventricular ejection fraction, by estimation, is 55 to 60%. The left ventricle has normal function. The left ventricle has no regional wall motion abnormalities. The left ventricular internal cavity size was normal in size. There is  no left ventricular hypertrophy. Right Ventricle: The right ventricular size is normal. No increase in right ventricular wall thickness. Right ventricular systolic function is low normal. Left Atrium: Left  atrial size was normal in size. No left atrial/left atrial appendage thrombus was detected. Right Atrium: Right atrial size was normal in size. Pericardium: There is no evidence of pericardial effusion. Mitral Valve: The mitral valve is rheumatic. There is moderate thickening of the mitral valve leaflet(s). There is mild calcification of the mitral valve leaflet(s). Severe mitral valve regurgitation. No evidence of mitral valve stenosis. Tricuspid Valve: The tricuspid valve is normal in structure. Tricuspid valve regurgitation is not demonstrated. No evidence of tricuspid stenosis. Aortic Valve: The aortic valve is normal in structure. Aortic valve regurgitation is not visualized. No aortic stenosis is present. Pulmonic Valve: The pulmonic valve was normal in structure. Pulmonic valve regurgitation is not visualized. No evidence of pulmonic stenosis. Aorta: The aortic root is normal in size and structure. Venous: The inferior vena cava is normal in size with greater than 50% respiratory variability, suggesting right atrial pressure of 3 mmHg. IAS/Shunts: No atrial level shunt detected by color flow Doppler. Agitated saline contrast was given intravenously to evaluate for intracardiac shunting. Additional Comments: 3D was performed not requiring image post processing on an independent workstation and was abnormal. MR Peak grad:    116.2 mmHg MR Mean grad:    65.0 mmHg MR Vmax:         539.00 cm/s MR Vmean:        363.0 cm/s MR PISA:         3.08 cm MR PISA Eff ROA: 51 mm MR PISA Radius:  0.70 cm Julien Nordmann MD Electronically signed by Julien Nordmann MD Signature Date/Time: 10/02/2023/5:56:00 PM    Final (Updated)    ECHOCARDIOGRAM LIMITED Result Date: 10/01/2023    ECHOCARDIOGRAM LIMITED REPORT   Patient Name:   AHTZIRY SAATHOFF Date of Exam: 10/01/2023 Medical Rec #:  [id number]    Height:       67.0 in Accession #:    [id number]   Weight:       211.0 lb Date of Birth:  06-20-1971   BSA:          2.069 m Patient  Age:    52 years     BP:           123/92 mmHg Patient Gender: F            HR:           58  bpm. Exam Location:  [facility] Procedure: Limited Echo, Cardiac Doppler and Color Doppler (Both Spectral and            Color Flow Doppler were utilized during procedure). Indications:     Mitral valve disorder I05.9  History:         Patient has prior history of Echocardiogram examinations, most                  recent 09/27/2023. Risk Factors:Hypertension.  Sonographer:     Cristela Blue Referring Phys:  [id number] CADENCE H FURTH Diagnosing Phys: Yvonne Kendall MD IMPRESSIONS  1. Left ventricular ejection fraction, by estimation, is 55 to 60%. The left ventricle has normal function. The left ventricular internal cavity size was severely dilated. There is mild left ventricular hypertrophy.  2. Left atrial size was severely dilated.  3. 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 No evidence of mitral stenosis.  4. The aortic valve is tricuspid. Aortic valve regurgitation is trivial.  5. There is normal pulmonary artery systolic pressure.  6. The inferior vena cava is normal in size with greater than 50% respiratory variability, suggesting right atrial pressure of 3 mmHg. FINDINGS  Left Ventricle: Left ventricular ejection fraction, by estimation, is 55 to 60%. The left ventricle has normal function. The left ventricular internal cavity size was severely dilated. There is mild left ventricular hypertrophy. Right Ventricle: There is normal pulmonary artery systolic pressure. The tricuspid regurgitant velocity is 1.89 m/s, and with an assumed right atrial pressure of 3 mmHg, the estimated right ventricular systolic pressure is 17.3 mmHg. Left Atrium: Left atrial size was severely dilated. Pericardium: There is no evidence of pericardial effusion. Mitral Valve: The mitral valve is rheumatic. There is severe thickening of the mitral valve leaflet(s). There is moderate calcification of the mitral valve leaflet(s).  Severe mitral valve regurgitation. No evidence of mitral valve stenosis. MV peak gradient, 16.8 mmHg. The mean mitral valve gradient is 4.0 mmHg. Tricuspid Valve: The tricuspid valve is normal in structure. Tricuspid valve regurgitation is trivial. Aortic Valve: The aortic valve is tricuspid. Aortic valve regurgitation is trivial. Pulmonic Valve: The pulmonic valve was not well visualized. Pulmonic valve regurgitation is not visualized. No evidence of pulmonic stenosis. Venous: The inferior vena cava is normal in size with greater than 50% respiratory variability, suggesting right atrial pressure of 3 mmHg. Additional Comments: Spectral Doppler performed.  LEFT VENTRICLE PLAX 2D LVIDd:         6.10 cm LVIDs:         4.10 cm LV PW:         1.20 cm LV IVS:        1.30 cm  LEFT ATRIUM            Index LA Vol (A4C): 125.0 ml 60.42 ml/m  MITRAL VALVE                  TRICUSPID VALVE MV Area (PHT): 1.76 cm       TR Peak grad:   14.3 mmHg MV Peak grad:  16.8 mmHg      TR Vmax:        189.00 cm/s MV Mean grad:  4.0 mmHg MV Vmax:       2.05 m/s MV Vmean:      92.4 cm/s MV Decel Time: 430 msec MR Peak grad:    123.7 mmHg MR Mean grad:    78.0 mmHg MR Vmax:  556.00 cm/s MR Vmean:        413.0 cm/s MR PISA:         7.60 cm MR PISA Eff ROA: 54 mm MR PISA Radius:  1.10 cm MV E velocity: 155.00 cm/s MV A velocity: 90.70 cm/s MV E/A ratio:  1.71 Yvonne Kendall MD Electronically signed by Yvonne Kendall MD Signature Date/Time: 10/01/2023/4:13:14 PM    Final    DG Chest Port 1 View Result Date: 09/28/2023 CLINICAL DATA:  [id number] Respiratory distress [id number] EXAM: PORTABLE CHEST 1 VIEW COMPARISON:  September 25, 2023 FINDINGS: The cardiomediastinal silhouette is unchanged and enlarged in contour. Favor small RIGHT pleural effusion. No pneumothorax. Increased diffuse bilateral airspace opacities with vascular indistinctness and peribronchial cuffing. IMPRESSION: 1. Constellation of findings are favored to reflect pulmonary  edema, increased. Differential considerations include atypical infection. 2. RIGHT pleural effusion. Electronically Signed   By: Meda Klinefelter M.D.   On: 09/28/2023 08:32   ECHOCARDIOGRAM COMPLETE Result Date: 09/27/2023    ECHOCARDIOGRAM REPORT   Patient Name:   KAISLEE CHAO Date of Exam: 09/26/2023 Medical Rec #:  [id number]    Height:       67.0 in Accession #:    [id number]   Weight:       211.0 lb Date of Birth:  01/13/1971   BSA:          2.069 m Patient Age:    52 years     BP:           150/137 mmHg Patient Gender: F            HR:           118 bpm. Exam Location:  [facility] Procedure: 2D Echo, Cardiac Doppler and Color Doppler (Both Spectral and Color            Flow Doppler were utilized during procedure). Indications:     Atrial Fibrillation  History:         Patient has no prior history of Echocardiogram examinations.                  CHF, Arrythmias:Atrial Fibrillation; Risk Factors:Hypertension,                  Dyslipidemia and Current Smoker.  Sonographer:     Mikki Harbor Referring Phys:  [id number] JAN A MANSY Diagnosing Phys: Julien Nordmann MD IMPRESSIONS  1. Left ventricular ejection fraction, by estimation, is 45 to 50%. The left ventricle has mildly decreased function. The left ventricle demonstrates global hypokinesis. The left ventricular internal cavity size was mildly dilated. There is mild left ventricular hypertrophy. Left ventricular diastolic parameters are indeterminate.  2. Right ventricular systolic function is mildly reduced. The right ventricular size is normal. There is normal pulmonary artery systolic pressure. The estimated right ventricular systolic pressure is 25.8 mmHg.  3. Left atrial size was severely dilated.  4. 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 No evidence of mitral stenosis. The mean mitral valve gradient is 11.0 mmHg. Moderate mitral annular calcification.  5. Tricuspid valve regurgitation is mild to moderate.  6. The aortic valve is  tricuspid. Aortic valve regurgitation is mild. No aortic stenosis is present.  7. There is borderline dilatation of the ascending aorta, measuring 38 mm.  8. The inferior vena cava is normal in size with greater than 50% respiratory variability, suggesting right atrial pressure of 3 mmHg. FINDINGS  Left Ventricle: Left ventricular ejection fraction, by estimation, is 45  to 50%. The left ventricle has mildly decreased function. The left ventricle demonstrates global hypokinesis. Strain was performed and the global longitudinal strain is indeterminate. The left ventricular internal cavity size was mildly dilated. There is mild left ventricular hypertrophy. Left ventricular diastolic parameters are indeterminate. Right Ventricle: The right ventricular size is normal. No increase in right ventricular wall thickness. Right ventricular systolic function is mildly reduced. There is normal pulmonary artery systolic pressure. The tricuspid regurgitant velocity is 2.39 m/s, and with an assumed right atrial pressure of 3 mmHg, the estimated right ventricular systolic pressure is 25.8 mmHg. Left Atrium: Left atrial size was severely dilated. Right Atrium: Right atrial size was normal in size. Pericardium: There is no evidence of pericardial effusion. Mitral Valve: The mitral valve is rheumatic. There is moderate thickening of the mitral valve leaflet(s). There is mild calcification of the mitral valve leaflet(s). Moderate mitral annular calcification. Severe mitral valve regurgitation. No evidence of  mitral valve stenosis. MV peak gradient, 19.2 mmHg. The mean mitral valve gradient is 11.0 mmHg. Tricuspid Valve: The tricuspid valve is normal in structure. Tricuspid valve regurgitation is mild to moderate. No evidence of tricuspid stenosis. Aortic Valve: The aortic valve is tricuspid. Aortic valve regurgitation is mild. No aortic stenosis is present. Aortic valve mean gradient measures 6.0 mmHg. Aortic valve peak gradient  measures 11.2 mmHg. Aortic valve area, by VTI measures 3.69 cm. Pulmonic Valve: The pulmonic valve was normal in structure. Pulmonic valve regurgitation is not visualized. No evidence of pulmonic stenosis. Aorta: The aortic root is normal in size and structure. There is borderline dilatation of the ascending aorta, measuring 38 mm. Venous: The inferior vena cava is normal in size with greater than 50% respiratory variability, suggesting right atrial pressure of 3 mmHg. IAS/Shunts: No atrial level shunt detected by color flow Doppler. Additional Comments: 3D was performed not requiring image post processing on an independent workstation and was indeterminate.  LEFT VENTRICLE PLAX 2D LVIDd:         5.80 cm LVIDs:         4.20 cm LV PW:         1.30 cm LV IVS:        1.20 cm LVOT diam:     2.30 cm LV SV:         94 LV SV Index:   45 LVOT Area:     4.15 cm  RIGHT VENTRICLE RV Basal diam:  3.85 cm RV Mid diam:    2.90 cm RV S prime:     14.90 cm/s LEFT ATRIUM              Index        RIGHT ATRIUM           Index LA diam:        4.70 cm  2.27 cm/m   RA Area:     19.00 cm LA Vol (A2C):   125.0 ml 60.42 ml/m  RA Volume:   57.00 ml  27.55 ml/m LA Vol (A4C):   84.3 ml  40.75 ml/m LA Biplane Vol: 107.0 ml 51.72 ml/m  AORTIC VALVE                     PULMONIC VALVE AV Area (Vmax):    3.34 cm      PV Vmax:       0.98 m/s AV Area (Vmean):   3.14 cm      PV Peak grad:  3.9  mmHg AV Area (VTI):     3.69 cm AV Vmax:           167.33 cm/s AV Vmean:          113.267 cm/s AV VTI:            0.254 m AV Peak Grad:      11.2 mmHg AV Mean Grad:      6.0 mmHg LVOT Vmax:         134.50 cm/s LVOT Vmean:        85.500 cm/s LVOT VTI:          0.226 m LVOT/AV VTI ratio: 0.89  AORTA Ao Root diam: 3.60 cm Ao Asc diam:  3.80 cm MITRAL VALVE                  TRICUSPID VALVE MV Area (PHT): 3.21 cm       TR Peak grad:   22.8 mmHg MV Area VTI:   2.97 cm       TR Vmax:        239.00 cm/s MV Peak grad:  19.2 mmHg MV Mean grad:  11.0 mmHg       SHUNTS MV Vmax:       2.19 m/s       Systemic VTI:  0.23 m MV Vmean:      159.0 cm/s     Systemic Diam: 2.30 cm MV Decel Time: 236 msec MR Peak grad:    112.6 mmHg MR Mean grad:    72.0 mmHg MR Vmax:         530.50 cm/s MR Vmean:        396.5 cm/s MR PISA:         9.05 cm MR PISA Eff ROA: 144 mm MR PISA Radius:  1.20 cm MV E velocity: 161.00 cm/s Julien Nordmann MD Electronically signed by Julien Nordmann MD Signature Date/Time: 09/26/2023/3:43:45 PM    Final (Updated)    CT Angio Chest Pulmonary Embolism (PE) W or WO Contrast Result Date: 09/26/2023 CLINICAL DATA:  New onset atrial fib with RVR, not feeling well the past few days, with shortness of breath. EXAM: CT ANGIOGRAPHY CHEST WITH CONTRAST TECHNIQUE: Multidetector CT imaging of the chest was performed using the standard protocol during bolus administration of intravenous contrast. Multiplanar CT image reconstructions and MIPs were obtained to evaluate the vascular anatomy. RADIATION DOSE REDUCTION: This exam was performed according to the departmental dose-optimization program which includes automated exposure control, adjustment of the mA and/or kV according to patient size and/or use of iterative reconstruction technique. CONTRAST:  OMNIPAQUE IOHEXOL 350 MG/ML SOLN COMPARISON:  Portable chest yesterday, chest radiograph 08/23/2015, and chest CT with contrast both 08/23/2015. FINDINGS: Cardiovascular: Since 2017, increased cardiomegaly with left chamber predominance. There is an enlarged pulmonary trunk measuring 3.6 cm indicating arterial hypertension, previously was 2.4 cm. Also noted is reflux of contrast into the IVC and hepatic veins which may suggest right heart dysfunction or tricuspid regurgitation. There is no increased RV/LV ratio and no embolic arterial filling defect is seen. Central pulmonary veins are prominent. There is mild aortic atherosclerosis. Aortic opacification is insufficient to assess the lumen. The great vessels branch  normally. Slight aneurysmal prominence is seen in the mid ascending segment which is 4.3 cm on 7:49 burden the aortic root measures 4 cm at the sinuses of Valsalva on 7:52. There is no substantial pericardial effusion. There are no visible coronary calcifications. Mediastinum/Nodes: Multiple calcified bihilar and mediastinal  lymph nodes are again shown. There is a 1.7 cm hypodense nodule posteriorly in the right lobe of the thyroid gland. This was not seen previously. Nonemergent follow-up ultrasound is recommended. Axillary spaces are clear. No noncalcified adenopathy is seen in the chest. There are retained secretions versus aspirate in the thoracic trachea. The main bronchi are clear.  The thoracic esophagus is unremarkable. Lungs/Pleura: There is interlobular septal thickening with a basal gradient in the lungs consistent with interstitial edema, moderate. Findings likely due to CHF or fluid overload. There is patchy haziness in the posterior lower lobes which most likely ground-glass edema. Diffuse bronchial thickening is seen and could be congestive, due to bronchitis or reactive airway disease. There are scattered calcified granulomas in both lungs. There is a stable 8 x 7 mm left lower lobe lateral basal nodule on 5:121, stable 4 mm left lower lobe pleural-based nodule laterally on 5:106. Bones are consistent with benign nodules given the length of stability. There is patchy dense peripheral airspace disease in the right upper lobe, a small amount in the right lower lobe perihilar area. This could be due to asymmetric airspace edema or superimposed pneumonia. Upper Abdomen: There are calcified splenic granulomas, occasional calcified hepatic granulomas. No calcified gallstones are seen. There is pericholecystic edema, in this case probably due to congestive edema if there are no localizing symptoms. If there are localizing symptoms, ultrasound may be helpful for further study. No other significant upper  abdominal findings. Abdominal aortic atherosclerosis. Musculoskeletal: Degenerative change and mild kyphosis thoracic spine. No acute or other significant osseous findings. No mass in the visualized chest wall. Review of the MIP images confirms the above findings. IMPRESSION: 1. No evidence of arterial embolus. 2. Increased cardiomegaly with left chamber predominance since 2017. 3. Enlarged pulmonary trunk 3.6 cm indicating arterial hypertension. 4. Reflux of contrast into the IVC and hepatic veins which may suggest right heart dysfunction or tricuspid regurgitation. 5. 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 Recommend annual imaging followup by CTA or MRA. This recommendation follows 2010 ACCF/AHA/AATS/ACR/ASA/SCA/SCAI/SIR/STS/ SVM Guidelines for the Diagnosis and Management of Patients with Thoracic Aortic Disease. Circulation. 2010; 121: Z610-R604. Aortic aneurysm NOS (ICD10-I71.9). 6. Aortic atherosclerosis. 7. Interstitial edema with a basal gradient in the lungs, with patchy haziness in the posterior lower lobes most likely ground-glass edema. Findings consistent with CHF or fluid overload. 8. Diffuse bronchial thickening which could be congestive, due to bronchitis or reactive airway disease. 9. Patchy dense peripheral airspace disease in the right upper lobe, a small amount in the right lower lobe perihilar area. This could be due to asymmetric airspace edema or superimposed pneumonia. 10. Pericholecystic edema, probably due to congestive edema if there are no localizing symptoms. If there are localizing symptoms, ultrasound may be helpful for further study. 11. 1.7 cm hypodense nodule posteriorly in the right lobe of the thyroid gland. Nonemergent follow-up ultrasound is recommended. 12. Stable left lower lobe nodules. 13. Retained secretions versus aspirate in the thoracic trachea. Aortic Atherosclerosis (ICD10-I70.0). Electronically Signed   By: Almira Bar M.D.   On: 09/26/2023 02:46   DG Chest  Portable 1 View Result Date: 09/25/2023 CLINICAL DATA:  Cough and atrial fibrillation. EXAM: PORTABLE CHEST 1 VIEW COMPARISON:  August 23, 2015 FINDINGS: The cardiac silhouette is mildly enlarged. Mild prominence of the central pulmonary vasculature is seen. Ill-defined subcentimeter calcified pulmonary nodules are seen scattered throughout both lungs. No focal consolidation, pleural effusion or pneumothorax is identified. The visualized skeletal structures are unremarkable. IMPRESSION: 1. Mild  cardiomegaly with mild central pulmonary vascular congestion. 2. Findings likely consistent with sequelae associated with prior granulomatous disease. Electronically Signed   By: Aram Candela M.D.   On: 09/25/2023 20:20    Microbiology: Recent Results (from the past 240 hours)  Culture, blood (Routine X 2) w Reflex to ID Panel     Status: None (Preliminary result)   Collection Time: 10/19/23  1:09 PM   Specimen: BLOOD  Result Value Ref Range Status   Specimen Description BLOOD LEFT ANTECUBITAL  Final   Special Requests   Final    BOTTLES DRAWN AEROBIC AND ANAEROBIC Blood Culture adequate volume   Culture   Final    NO GROWTH 3 DAYS Performed at [facility], [address], [place], [place] [address]    Report Status PENDING  Incomplete  Culture, blood (Routine X 2) w Reflex to ID Panel     Status: None (Preliminary result)   Collection Time: 10/19/23  3:02 PM   Specimen: BLOOD  Result Value Ref Range Status   Specimen Description BLOOD RIGHT ANTECUBITAL  Final   Special Requests   Final    BOTTLES DRAWN AEROBIC AND ANAEROBIC Blood Culture adequate volume   Culture   Final    NO GROWTH 3 DAYS Performed at [facility], [address] [place], [place] [address]    Report Status PENDING  Incomplete  Respiratory (~20 pathogens) panel by PCR     Status: None   Collection Time: 10/19/23  9:43 PM   Specimen: Nasopharyngeal Swab; Respiratory  Result Value Ref Range Status    Adenovirus NOT DETECTED NOT DETECTED Final   Coronavirus 229E NOT DETECTED NOT DETECTED Final    Comment: (NOTE) The Coronavirus on the Respiratory Panel, DOES NOT test for the novel  Coronavirus (2019 nCoV)    Coronavirus HKU1 NOT DETECTED NOT DETECTED Final   Coronavirus NL63 NOT DETECTED NOT DETECTED Final   Coronavirus OC43 NOT DETECTED NOT DETECTED Final   Metapneumovirus NOT DETECTED NOT DETECTED Final   Rhinovirus / Enterovirus NOT DETECTED NOT DETECTED Final   Influenza A NOT DETECTED NOT DETECTED Final   Influenza B NOT DETECTED NOT DETECTED Final   Parainfluenza Virus 1 NOT DETECTED NOT DETECTED Final   Parainfluenza Virus 2 NOT DETECTED NOT DETECTED Final   Parainfluenza Virus 3 NOT DETECTED NOT DETECTED Final   Parainfluenza Virus 4 NOT DETECTED NOT DETECTED Final   Respiratory Syncytial Virus NOT DETECTED NOT DETECTED Final   Bordetella pertussis NOT DETECTED NOT DETECTED Final   Bordetella Parapertussis NOT DETECTED NOT DETECTED Final   Chlamydophila pneumoniae NOT DETECTED NOT DETECTED Final   Mycoplasma pneumoniae NOT DETECTED NOT DETECTED Final    Comment: Performed at [facility], [address], [place], [place] [address]     Labs: CBC: Recent Labs  Lab 10/19/23 [phone] 10/20/23 0216 10/21/23 0346 10/22/23 0526  WBC 25.0* 20.7* 13.3* 11.9*  HGB 9.6* 9.0* 8.7* 10.5*  HCT 30.4* 28.7* 26.1* 32.4*  MCV 86.9 87.0 81.6 83.5  PLT 497* 427* 368 355   Basic Metabolic Panel: Recent Labs  Lab 10/19/23 0741 10/20/23 0216 10/21/23 0346 10/21/23 0834 10/22/23 0526  NA 133* 134* 132*  --  132*  K 4.0 3.7 3.8  --  4.1  CL 99 96* 96*  --  99  CO2 26 26 25   --  24  GLUCOSE 120* 80 75  --  104*  BUN 15 16 13   --  13  CREATININE 0.71 0.79 0.83  --  0.75  CALCIUM 8.7* 7.9* 8.1*  --  8.3*  MG  --   --   --  2.0 2.3  PHOS  --   --   --  3.3 3.5   Liver Function Tests: Recent Labs  Lab 10/20/23 0216 10/21/23 0346 10/22/23 0526  AST 30 22 23   ALT 65* 45*  41  ALKPHOS 87 73 76  BILITOT 0.3 0.4 0.5  PROT 6.3* 6.0* 6.7  ALBUMIN 2.6* 2.5* 2.8*   No results for input(s): "LIPASE", "AMYLASE" in the last 168 hours. No results for input(s): "AMMONIA" in the last 168 hours. Cardiac Enzymes: No results for input(s): "CKTOTAL", "CKMB", "CKMBINDEX", "TROPONINI" in the last 168 hours. BNP (last 3 results) Recent Labs    09/25/23 1711 10/19/23 0741  BNP 700.0* 1,629.6*   CBG: No results for input(s): "GLUCAP" in the last 168 hours.  Time spent: 35 minutes  Signed:  Gillis Santa  [facility] 10/22/2023 12:55 PM

## 2023-10-22 NOTE — Telephone Encounter (Signed)
 Patient Product/process development scientist completed.    The patient is insured through [employer]. Patient has Medicare and is not eligible for a copay card, but may be able to apply for patient assistance or Medicare RX Payment Plan (Patient Must reach out to their plan, if eligible for payment plan), if available.    Ran test claim for enoxaparin (Lovenox) 100 mg and the current 30 day co-pay is $0.00.   This test claim was processed through [facility]- copay amounts may vary at other pharmacies due to pharmacy/plan contracts, or as the patient moves through the different stages of their insurance plan.     Roland Earl, CPHT Pharmacy Technician III Certified Patient Advocate [facility] Patient Advocate Team Direct Number: [phone]  Fax: [phone]

## 2023-10-22 NOTE — Progress Notes (Signed)
 PHARMACIST - PHYSICIAN COMMUNICATION DR:   Lucianne Muss CONCERNING: Antibiotic IV to Oral Route Change Policy  RECOMMENDATION: This patient is receiving Azithromycin by the intravenous route.  Based on criteria approved by the Pharmacy and Therapeutics Committee, the antibiotic(s) is/are being converted to the equivalent oral dose form(s).   DESCRIPTION: These criteria include: Patient being treated for a respiratory tract infection, urinary tract infection, cellulitis or clostridium difficile associated diarrhea if on metronidazole The patient is not neutropenic and does not exhibit a GI malabsorption state The patient is eating (either orally or via tube) and/or has been taking other orally administered medications for a least 24 hours The patient is improving clinically and has a Tmax < 100.5   Neyra Pettie Rodriguez-Guzman PharmD, BCPS 10/22/2023 8:28 AM

## 2023-10-22 NOTE — Plan of Care (Signed)
  Problem: Clinical Measurements: Goal: Ability to maintain clinical measurements within normal limits will improve Outcome: Progressing   Problem: Clinical Measurements: Goal: Diagnostic test results will improve Outcome: Progressing   Problem: Clinical Measurements: Goal: Respiratory complications will improve Outcome: Progressing   Problem: Coping: Goal: Level of anxiety will decrease Outcome: Progressing

## 2023-10-22 NOTE — Progress Notes (Signed)
 Patient ID: Sandra Hines, female   DOB: [birth year], 53 y.o.   MRN: [id number]     Advanced Heart Failure Rounding Note  Cardiologist: Julien Nordmann, MD  Chief Complaint: CHF Subjective:    Feels better overall.  Walked in room without dyspnea this morning.  I/Os not accurate but says she urinated a lot with IV Lasix yesterday.    Objective:   Weight Range: 93.2 kg Body mass index is 32.18 kg/m.   Vital Signs:   Temp:  [98 F (36.7 C)-98.5 F (36.9 C)] 98.3 F (36.8 C) (04/01 0716) Pulse Rate:  [71-80] 80 (04/01 0716) Resp:  [18] 18 (04/01 0716) BP: (104-129)/(69-85) 124/85 (04/01 0716) SpO2:  [93 %-100 %] 93 % (04/01 0716) Weight:  [93.2 kg] 93.2 kg (04/01 0500) Last BM Date : 10/20/23  Weight change: Filed Weights   10/20/23 1944 10/21/23 0500 10/22/23 0500  Weight: 92.9 kg 92.9 kg 93.2 kg    Intake/Output:   Intake/Output Summary (Last 24 hours) at 10/22/2023 0835 Last data filed at 10/22/2023 0400 Gross per 24 hour  Intake 1055 ml  Output --  Net 1055 ml      Physical Exam    General:  Well appearing. No resp difficulty HEENT: Normal Neck: Supple. JVP 8. Carotids 2+ bilat; no bruits. No lymphadenopathy or thyromegaly appreciated. Cor: PMI nondisplaced. Regular rate & rhythm with mechanical S1. 2/6 SEM RUSB. Lungs: Clear Abdomen: Soft, nontender, nondistended. No hepatosplenomegaly. No bruits or masses. Good bowel sounds. Extremities: No cyanosis, clubbing, rash, edema Neuro: Alert & orientedx3, cranial nerves grossly intact. moves all 4 extremities w/o difficulty. Affect pleasant   Telemetry   NSR 70s, personally reviewed.   EKG    NSR, LVH, 1st degree AVB (personally reviewed)  Labs    CBC Recent Labs    10/21/23 0346 10/22/23 0526  WBC 13.3* 11.9*  HGB 8.7* 10.5*  HCT 26.1* 32.4*  MCV 81.6 83.5  PLT 368 355   Basic Metabolic Panel Recent Labs    65/78/46 0346 10/21/23 0834 10/22/23 0526  NA 132*  --  132*  K 3.8  --  4.1  CL 96*   --  99  CO2 25  --  24  GLUCOSE 75  --  104*  BUN 13  --  13  CREATININE 0.83  --  0.75  CALCIUM 8.1*  --  8.3*  MG  --  2.0 2.3  PHOS  --  3.3 3.5   Liver Function Tests Recent Labs    10/21/23 0346 10/22/23 0526  AST 22 23  ALT 45* 41  ALKPHOS 73 76  BILITOT 0.4 0.5  PROT 6.0* 6.7  ALBUMIN 2.5* 2.8*   No results for input(s): "LIPASE", "AMYLASE" in the last 72 hours. Cardiac Enzymes No results for input(s): "CKTOTAL", "CKMB", "CKMBINDEX", "TROPONINI" in the last 72 hours.  BNP: BNP (last 3 results) Recent Labs    09/25/23 1711 10/19/23 0741  BNP 700.0* 1,629.6*    ProBNP (last 3 results) No results for input(s): "PROBNP" in the last 8760 hours.   D-Dimer No results for input(s): "DDIMER" in the last 72 hours. Hemoglobin A1C No results for input(s): "HGBA1C" in the last 72 hours. Fasting Lipid Panel No results for input(s): "CHOL", "HDL", "LDLCALC", "TRIG", "CHOLHDL", "LDLDIRECT" in the last 72 hours. Thyroid Function Tests No results for input(s): "TSH", "T4TOTAL", "T3FREE", "THYROIDAB" in the last 72 hours.  Invalid input(s): "FREET3"  Other results:   Imaging    No results found.  Medications:     Scheduled Medications:  amiodarone  200 mg Oral Daily   [START ON 10/23/2023] ascorbic acid  500 mg Oral Daily   aspirin  81 mg Oral Daily   azithromycin  500 mg Oral QPM   carvedilol  3.125 mg Oral BID WC   DULoxetine  40 mg Oral Daily   empagliflozin  10 mg Oral Daily   enoxaparin (LOVENOX) injection  1 mg/kg Subcutaneous Q12H   [START ON 10/23/2023] iron polysaccharides  150 mg Oral Daily   rosuvastatin  40 mg Oral Daily   sacubitril-valsartan  1 tablet Oral BID   sodium chloride flush  3 mL Intravenous Q12H   spironolactone  12.5 mg Oral Daily   torsemide  20 mg Oral Daily   Vitamin D (Ergocalciferol)  50,000 Units Oral Q7 days   warfarin  5 mg Oral ONCE-1600   Warfarin - Pharmacist Dosing Inpatient   Does not apply q1600    Infusions:   cefTRIAXone (ROCEPHIN)  IV 2 g (10/21/23 2143)   iron sucrose 300 mg (10/21/23 2006)    PRN Medications: acetaminophen, morphine injection, nitroGLYCERIN, ondansetron **OR** ondansetron (ZOFRAN) IV, oxyCODONE, sodium chloride flush    Assessment/Plan   1. Acute systolic CHF: S/p mechanical MV replacement.  Pre-op echo with EF 45-50%.  Cath pre-op with no significant CAD. Post-op echo this admission with EF 25-30%, mild LV dilation, moderate LVH, mild RV enlargement with mildly decreased systolic function, mechanical MV with mean gradient 11 mmHg, ?mild-moderate AI, IVC not dilated.  I suspect we are seeing the patient's true LV function in setting of higher afterload with severe mitral regurgitation repaired. She went home without a diuretic, gained weight and was admitted with significant volume overload.  She has felt better with IV diuresis here, looks near euvolemic today.  - Start torsemide 20 mg daily.  - Continue Entresto 24/26 bid.  - Add spironolactone 12.5 mg daily.  - Add Coreg 3.125 mg bid - Continue Jardiance 10 mg daily.  2. 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  Patient had mechanical MV replacement with Maze and LA appendage occlusion on 10/07/23. INR 2.3 today.  - Continue warfarin for goal INR 2.5-3.5 - Continue ASA 81 daily.  - Endocarditis prophylaxis with dentistry.  3. Atrial fibrillation: Paroxysmal.  She had Maze procedure with her surgery.  She is in NSR currently.  - Continue amiodarone 200 mg daily, will eventually stop.  4. ID: WBCs high at admission, empirically covered with ceftriaxone/azithromycin.   From my standpoint, I think she can go home as INR is close to 2.5 (should get Lovenox today).  Will need followup in CHF clinic and will need INR check later this week with her coumadin clinic.  Cardiac meds for home: warfarin, ASA 81, torsemide 20 mg daily, spironolactone 12.5 daily, Entresto 24/26 bid, Jardiance 10 daily, Coreg 3.125 mg bid.   Length of Stay:  2  Marca Ancona, MD  10/22/2023, 8:35 AM  Advanced Heart Failure Team Pager [phone] (M-F; 7a - 5p)  Please contact [facility] for night-coverage after hours (5p -7a ) and weekends on [email or web address]

## 2023-10-22 NOTE — Consult Note (Signed)
 [facility] Liaison Consult Note   10/22/2023  DANIRA NYLANDER 1971-02-15 [id number]  *Coverage for Elliot Cousin, [facility] Liaison for [facility] for patient with less than 7 days readmission with high risk score for unplanned readmission  Insurance: Gwenlyn Fudge  Primary Care Provider: Ronnald Ramp, MD with [facility], this provider is listed for the transition of care follow up appointments  and [facility] calls   [facility] Liaison screened the patient remotely at [facility]. Spoke with patient via hospital phone assisted by hospital operator.  HIPAA verified. Explained reason for call and for post hospital follow up.    The patient was screened for 7 and 30 day readmission hospitalization with noted high risk score for unplanned readmission risk 3 hospital admissions in 6 months.  The patient was assessed for potential [facility] Coordination service needs for post hospital transition for care coordination. Review of patient's electronic medical record reveals patient is readmitted with NSTEMI after recent MVR.  Plan: [facility] Liaison will continue to follow progress and disposition to assess for post hospital community care coordination/management needs.  Referral request for community care coordination: Anticipate follow up with [facility] Community TOC team.   [facility], Population Health does not replace or interfere with any arrangements made by the Inpatient Transition of Care team.   For questions contact:   Charlesetta Shanks, RN, BSN, CCM [place]  [facility], [facility] Liaison Direct Dial: [phone] or secure chat Email: [email or web address]

## 2023-10-22 NOTE — TOC Initial Note (Signed)
 Transition of Care [facility]) - Initial/Assessment Note    Patient Details  Name: Sandra Hines MRN: [id number] Date of Birth: 29-Oct-1970  Transition of Care [facility]) CM/SW Contact:    Truddie Hidden, RN Phone Number: 10/22/2023, 11:27 AM  Clinical Narrative:                 Risk assessment completed   Admitted for: chest pain related to NSTEMi Admitted from: Home with children [facility]  Current home health/prior home health/DME:[facility] Transportation: family            Patient Goals and CMS Choice            Expected Discharge Plan and Services         Expected Discharge Date: 10/22/23                                    Prior Living Arrangements/Services                       Activities of Daily Living   ADL Screening (condition at time of admission) Independently performs ADLs?: Yes (appropriate for developmental age) Is the patient deaf or have difficulty hearing?: No Does the patient have difficulty seeing, even when wearing glasses/contacts?: No Does the patient have difficulty concentrating, remembering, or making decisions?: No  Permission Sought/Granted                  Emotional Assessment              Admission diagnosis:  Acute pulmonary edema (HCC) [J81.0] NSTEMI (non-ST elevated myocardial infarction) (HCC) [I21.4] Chest pain, unspecified type [R07.9] Patient Active Problem List   Diagnosis Date Noted   Acute HFrEF (heart failure with reduced ejection fraction) (HCC) 10/21/2023   NSTEMI (non-ST elevated myocardial infarction) (HCC) 10/19/2023   Leukocytosis 10/19/2023   S/P MVR (mitral valve replacement) 10/07/2023   Acute on chronic heart failure with preserved ejection fraction (HFpEF) (HCC) 10/03/2023   Acute diastolic heart failure (HCC) 09/29/2023   Severe mitral valve regurgitation 09/28/2023   Atrial fibrillation (HCC) 09/25/2023   Acute CHF (congestive heart failure) (HCC) 09/25/2023   Essential  hypertension 09/25/2023   Anxiety and depression 09/25/2023   Dyslipidemia 09/25/2023   Acute bronchitis due to other specified organisms 09/05/2023   Mood disorder (HCC) 07/20/2023   Arthritis 07/20/2023   Annual physical exam 07/19/2023   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 07/19/2023   Positive screening for depression on 9-item Patient Health Questionnaire (PHQ-9) 07/19/2023   Establishing care with new doctor, encounter for 07/19/2023   Moderate tobacco dependence 07/19/2023   Encounter for screening for HIV 07/19/2023   Encounter for hepatitis C screening test for low risk patient 07/19/2023   Primary hypertension 07/19/2023   Screen for colon cancer 07/19/2023   Screening mammogram for breast cancer 07/19/2023   Screening for lung cancer 07/19/2023   BMI 32.0-32.9,adult 07/19/2023   Perimenopausal symptom 07/19/2023   PCP:  Ronnald Ramp, MD Pharmacy:   [facility] [address], [place] - [address] [place] [address] Phone: [phone] Fax: [phone]  [facility] [address] [place] [address] Phone: [phone] Fax: [phone]  [id number] [place], [place] - [address] [place] [address] Phone: [phone] Fax: [phone]  Social Drivers of Health (SDOH) Social History: SDOH Screenings   Food Insecurity: No Food Insecurity (10/20/2023)  Housing: Low Risk  (10/20/2023)  Transportation Needs: No Transportation Needs (10/20/2023)  Utilities: Not At Risk (10/20/2023)  Depression (PHQ2-9): High Risk (09/25/2023)  Financial Resource Strain: Medium Risk (09/30/2023)  Social Connections: Moderately Integrated (10/20/2023)  Tobacco Use: High Risk (10/19/2023)   SDOH Interventions:     Readmission Risk Interventions    10/14/2023   12:15 PM  Readmission Risk Prevention  Plan  Transportation Screening Complete  PCP or Specialist Appt within 5-7 Days Complete  Home Care Screening Complete  Medication Review (RN CM) Complete

## 2023-10-23 ENCOUNTER — Ambulatory Visit: Attending: Cardiovascular Disease

## 2023-10-23 ENCOUNTER — Inpatient Hospital Stay: Admitting: Family Medicine

## 2023-10-23 ENCOUNTER — Ambulatory Visit (INDEPENDENT_AMBULATORY_CARE_PROVIDER_SITE_OTHER): Payer: Self-pay | Admitting: Physician Assistant

## 2023-10-23 VITALS — BP 122/86 | HR 80 | Resp 20 | Ht 67.0 in | Wt 197.0 lb

## 2023-10-23 DIAGNOSIS — Z952 Presence of prosthetic heart valve: Secondary | ICD-10-CM

## 2023-10-23 NOTE — Progress Notes (Deleted)
 Established patient visit   Patient: SHEVONNE WOLF   DOB: 18-Jan-1971   53 y.o. Female  MRN: [id number] Visit Date: 10/23/2023  Today's healthcare provider: Ronnald Ramp, MD   No chief complaint on file.  Subjective       Discussed the use of AI scribe software for clinical note transcription with the patient, who gave verbal consent to proceed.  History of Present Illness      Past Medical History:  Diagnosis Date   Hypertension     Medications: Outpatient Medications Prior to Visit  Medication Sig   acetaminophen (TYLENOL) 325 MG tablet Take 2 tablets (650 mg total) by mouth every 4 (four) hours as needed for headache or mild pain (pain score 1-3).   amiodarone (PACERONE) 200 MG tablet Take 2 tablets (400 mg total) by mouth 2 (two) times daily. For 5 days, then 400 mg daily for 5 days, then 200 mg daily   aspirin 81 MG chewable tablet Chew 1 tablet (81 mg total) by mouth daily.   busPIRone (BUSPAR) 5 MG tablet Take 1 tablet (5 mg total) by mouth 3 (three) times daily.   carvedilol (COREG) 3.125 MG tablet Take 1 tablet (3.125 mg total) by mouth 2 (two) times daily with a meal.   cefdinir (OMNICEF) 300 MG capsule Take 1 capsule (300 mg total) by mouth 2 (two) times daily for 5 days.   DULoxetine (CYMBALTA) 20 MG capsule Take 2 capsules (40 mg total) by mouth daily.   empagliflozin (JARDIANCE) 10 MG TABS tablet Take 1 tablet (10 mg total) by mouth daily.   enoxaparin (LOVENOX) 100 MG/ML injection Inject 1 mL (100 mg total) into the skin 2 (two) times daily as directed by Coumadin clinic   Fe Fum-Vit C-Vit B12-FA (TRIGELS-F FORTE) CAPS capsule Take 1 capsule by mouth daily.   ipratropium-albuterol (DUONEB) 0.5-2.5 (3) MG/3ML SOLN Take 3 mLs by nebulization every 2 (two) hours as needed (shortness of breath).   magnesium hydroxide (MILK OF MAGNESIA) 400 MG/5ML suspension Take 30 mLs by mouth daily as needed for mild constipation or moderate constipation.    oxyCODONE (OXY IR/ROXICODONE) 5 MG immediate release tablet Take 1 tablet (5 mg total) by mouth 3 (three) times daily as needed for up to 5 days for severe pain (pain score 7-10).   polyethylene glycol powder (GLYCOLAX/MIRALAX) 17 GM/SCOOP powder Take 17 g by mouth daily. Mix as directed.   rosuvastatin (CRESTOR) 40 MG tablet Take 1 tablet (40 mg total) by mouth daily.   sacubitril-valsartan (ENTRESTO) 24-26 MG Take 1 tablet by mouth 2 (two) times daily.   spironolactone (ALDACTONE) 25 MG tablet Take 0.5 tablets (12.5 mg total) by mouth daily.   torsemide (DEMADEX) 20 MG tablet Take 1 tablet (20 mg total) by mouth daily.   [START ON 10/28/2023] Vitamin D, Ergocalciferol, (DRISDOL) 1.25 MG (50000 UNIT) CAPS capsule Take 1 capsule (50,000 Units total) by mouth every 7 (seven) days.   warfarin (COUMADIN) 2.5 MG tablet Take 1 tablet (2.5 mg total) by mouth as directed. Alternating schedule every 3 days, take 2.5 mg daily for 2 days, then 5 mg daily- then repeat 3 day cycle. Then per coumadin clinic   No facility-administered medications prior to visit.    Review of Systems  {Insert previous labs (optional):23779} {See past labs  Heme  Chem  Endocrine  Serology  Results Review (optional):1}   Objective    LMP  (LMP Unknown) Comment: neg preg test on 10/04/2023 {Insert  last BP/Wt (optional):23777}{See vitals history (optional):1}    Physical Exam  ***  No results found for any visits on 10/23/23.  Assessment & Plan     Problem List Items Addressed This Visit   None   Assessment and Plan Assessment & Plan      No follow-ups on file.         Ronnald Ramp, MD  [facility] [phone] (phone) [phone] (fax)  [facility]

## 2023-10-24 LAB — CULTURE, BLOOD (ROUTINE X 2)
Culture: NO GROWTH
Culture: NO GROWTH
Special Requests: ADEQUATE
Special Requests: ADEQUATE

## 2023-10-25 ENCOUNTER — Ambulatory Visit: Attending: Cardiology | Admitting: Cardiology

## 2023-10-25 ENCOUNTER — Encounter: Payer: Self-pay | Admitting: Cardiology

## 2023-10-25 VITALS — BP 116/80 | HR 86 | Ht 67.0 in | Wt 195.0 lb

## 2023-10-25 DIAGNOSIS — I5021 Acute systolic (congestive) heart failure: Secondary | ICD-10-CM | POA: Diagnosis not present

## 2023-10-25 DIAGNOSIS — F419 Anxiety disorder, unspecified: Secondary | ICD-10-CM | POA: Diagnosis not present

## 2023-10-25 DIAGNOSIS — I480 Paroxysmal atrial fibrillation: Secondary | ICD-10-CM | POA: Diagnosis not present

## 2023-10-25 DIAGNOSIS — J189 Pneumonia, unspecified organism: Secondary | ICD-10-CM | POA: Diagnosis not present

## 2023-10-25 DIAGNOSIS — I428 Other cardiomyopathies: Secondary | ICD-10-CM

## 2023-10-25 DIAGNOSIS — I10 Essential (primary) hypertension: Secondary | ICD-10-CM | POA: Diagnosis not present

## 2023-10-25 DIAGNOSIS — I340 Nonrheumatic mitral (valve) insufficiency: Secondary | ICD-10-CM

## 2023-10-25 DIAGNOSIS — D509 Iron deficiency anemia, unspecified: Secondary | ICD-10-CM | POA: Diagnosis not present

## 2023-10-25 DIAGNOSIS — Z952 Presence of prosthetic heart valve: Secondary | ICD-10-CM | POA: Diagnosis not present

## 2023-10-25 DIAGNOSIS — E782 Mixed hyperlipidemia: Secondary | ICD-10-CM

## 2023-10-25 DIAGNOSIS — F32A Depression, unspecified: Secondary | ICD-10-CM

## 2023-10-25 NOTE — Progress Notes (Signed)
 Cardiology Office Note:  .   Date:  10/25/2023  ID:  Sandra Hines, DOB [birth year], MRN [id number] PCP: Ronnald Ramp, MD  [facility] Providers Cardiologist:  Julien Nordmann, MD    History of Present Illness: .   Sandra Hines is a 53 y.o. 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   Ms. Hook who previously presented to [facility] emergency department 09/25/2023 with acute onset of palpitations, shortness of breath, generalized weakness and malaise, and cough with upper respiratory symptoms not improving.  She was found to be hypertensive emergency with a blood pressure of 211/154 in atrial fibrillation with RVR.  EKG revealed suspected atrial fibrillation with rate of 184 with repolarization abnormality.  Chest x-ray showed mild cardiomegaly with mild central pulmonary vascular congestion.  Cardiology was consulted.  She was placed on Cardizem drip initially but due to lack of response was placed on amiodarone.  She was given bisoprolol and amlodipine for blood pressure control.  She was restarted on rosuvastatin after lipid panel was completed.  She did require BiPAP briefly then high flow nasal cannula.  She was treated for possible bronchitis and early pneumonia.  She was diuresed with furosemide.  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  She was  placed on a heparin drip for atrial fibrillation.  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  She converted to sinus rhythm and was transition to oral amiodarone.  Bisoprolol twice daily was continued.  Cardiac catheterization was done 10/03/2023 showed no significant atherosclerotic coronary artery disease.  She was transferred to [facility] for further evaluation and treatment on 10/03/2023.  Cardiothoracic surgery (Dr. Leafy Ro) was consulted for consideration of mitral valve repair/replacement.  Dr. Leafy Ro discussed the need for median sternotomy for mitral valve replacement using mechanical mitral valve, PVI MAZE, and consideration of replacement of ascending thoracic aorta.   She underwent median sternotomy for mechanical mitral valve replacement and PVI maze.  Aortic valve dilation was not severe enough to warrant repair.  Following procedure she was separated from cardiopulmonary bypass without difficulty and was transferred to the ICU in stable condition.  She was extubated early in the afternoon of surgery.  She did have underlying sinus with bigeminy after surgery.  She also had A-fib with RVR on admission and was not initially treated with amiodarone or Lopressor postoperatively because of sinus arrest/atrial bigeminy.  Advanced heart failure followed her postop and diuresed her with IV Lasix and she was started on BiDil half a tablet 3 times daily.  On 318 she was not stable to be advanced to full GDMT but was recommended to continue to escalate it on an outpatient basis.  She was started on low-dose warfarin with PT and INR monitored daily.  Unfortunately she was not started on Lovenox postoperatively due to thrombocytopenia.  She had to be started on  oral amiodarone 200 mg twice daily for the atrial fibrillation.  EP was asked to evaluate as she had  paroxysms of atrial fibrillation and bradycardia when she converted.  Bradycardia events subsided over time and it was not felt that she would require pacemaker placement.  She had received a course of diuretics during the hospitalization but was thought that ongoing diuresis at discharge was not needed.  She was started on IV ceftriaxone for possible bronchitis due to leukocytosis and cough.  She was felt stable for discharge out of the ICU.  She continued to make ongoing improvements in her physical recovery.  Incisions are noted to be healing well without evidence of infection.  Oxygen has been weaned and she was maintaining saturations on room air.  INR was therapeutic at 2.7.  So she was considered stable was able to be discharged from the facility on 10/14/2023.   She presented to [facility] emergency department from home on 10/19/2023 with midsternal chest pain and reported sharp/stabbing shortness of breath, increased swelling, and weight gain 7 pounds.  Her symptoms had worsened over the last 24 hours.  She was previously discharged from [facility] stating she was not placed on diuretic therapy even though she required diuresis during hospitalization.  She stated she had called the clinic and asked for Lasix but unfortunately was advised without her medication profile.  Initial vital signs reveal blood pressure 143/83, pulse of 73, respirations of 15, temperature 97.8.  Pertinent labs revealed a sodium 133, blood glucose 120, calcium 8.7, hemoglobin 9.6, hematocrit 30.4, INR 2.2, high-sensitivity troponin 4 8, 490, 449, and a BNP of 1629.6.  Chest x-ray revealed persistent left basilar opacity, atelectasis versus pneumonia and probable small left pleural effusion, mild right perihilar interstitial opacity which would reflect edema versus infection since.  She was given furosemide 60 mg IV and oxycodone of 5 mg while in the emergency department.  Due to mitral valve replacement she was placed on heparin  infusion and Coumadin was restarted for goal of 2.5-3.5.  High-sensitivity troponins were considered low suspicion for ACS given left heart catheterization 10/03/2023 showed no angiographically significant coronary artery disease.  Echocardiogram was repeated during the hospitalization which revealed significant drop with her LVEF of 25-30% and RV severely reduced from previous 45 to 50%.  She was diuresed adequately during her hospitalization and was discharged on torsemide 20 mg p.o. daily, BiDil was discontinued and she was started on Entresto 24/26 mg twice daily, and Jardiance 10 mg daily.  Continue to escalate GDMT with repeat echocardiogram ordered.  She was considered stable and discharged from the facility on 10/22/2023.  She presents to clinic today accompanied by family member stated she continued to have shortness of breath and chest discomfort.  It has improved since she was down 17 pounds after being diuresed in the hospital.  She has several questions today related to medication and diet.  She also has concerns for her heart function being down.  She states she was confused with the hospitalization and she was unsure as to why her heart function would have reduced and with the medication changes were to benefit.  She also stated she just had follow-up with [facility] where there were no further medication changes that were made.  She stated that she was advised when her next PT/INR would be if she would receive a telephone call.  But she was concerned because she only had 4 days left of Lovenox.  She was also advised that she  would need to return in 2 weeks to see Dr. Leafy Ro and also have an updated chest x-ray.  ROS: 10 point review of system has been reviewed and considered negative with exception was been listed in the HPI  Studies Reviewed: Marland Kitchen   EKG Interpretation Date/Time:  Friday October 25 2023 08:48:04 EDT Ventricular Rate:  86 PR Interval:  248 QRS Duration:  110 QT Interval:  388 QTC  Calculation: 464 R Axis:   46  Text Interpretation: Sinus rhythm with 1st degree A-V block Possible Left atrial enlargement Left ventricular hypertrophy Left ventricular hypertrophy with repolarization abnormality ( Sokolow-Lyon , Cornell product ) When compared with ECG of 22-Oct-2023 03:56, ST depression and TWI in inferior lateral leads Confirmed by Charlsie Quest ([id number]) on 10/25/2023 8:54:44 AM    2D echo 10/21/2023 1. Left ventricular ejection fraction, by estimation, is 25 to 30%. Left  ventricular ejection fraction by 3D volume is 18 %. The left ventricle has  severely decreased function. The left ventricle demonstrates global  hypokinesis. The left ventricular  internal cavity size was mildly dilated. There is moderate left  ventricular hypertrophy. Left ventricular diastolic function could not be  evaluated. The average left ventricular global longitudinal strain is -5.0  %. The global longitudinal strain is  abnormal.   2. Right ventricular systolic function is severely reduced. The right  ventricular size is mildly enlarged. Tricuspid regurgitation signal is  inadequate for assessing PA pressure.   3. Left atrial size was severely dilated.   4. The mitral valve has been repaired/replaced. Trivial mitral valve  regurgitation. The mean mitral valve gradient is 11.0 mmHg.   5. The aortic valve has an indeterminant number of cusps. Aortic valve  regurgitation is mild to moderate.   6. The inferior vena cava is normal in size with greater than 50%  respiratory variability, suggesting right atrial pressure of 3 mmHg.   LHC 10/03/2023 Conclusions: No angiographically significant atherosclerotic coronary artery disease.  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 Moderately elevated left heart, right heart, and pulmonary artery pressures.  Large v-waves observed on PCWP tracing consistent with the patient's severe mitral regurgitation. Low normal to mildly reduced Fick cardiac  output/index.   Recommendations: Escalate diuresis and consider consultation with advanced heart failure team for optimization of acute diastolic heart failure in the setting of severe mitral valve regurgitation. Cardiac surgery consultation for mitral valve replacement.  Response to medical therapy will dictate if this can be done as an outpatient or if the patient will need transfer to [facility] for inpatient evaluation. Resume heparin infusion 2 hours after TR band removal. Primary prevention of coronary artery disease.   TEE 10/02/2023 1. Left ventricular ejection fraction, by estimation, is 55 to 60%. The  left ventricle has normal function. The left ventricle has no regional  wall motion abnormalities.   2. Right ventricular systolic function is low normal. The right  ventricular size is normal.   3. The mitral valve is rheumatic, both anterior and posterior leaflets  involved. Severe mitral valve regurgitation, central and eccentric jet  noted. No evidence of mitral stenosis.   4. The aortic valve is normal in structure. Aortic valve regurgitation is  not visualized. No aortic stenosis is present.   5. The inferior vena cava is normal in size with greater than 50%  respiratory variability, suggesting right atrial pressure of 3 mmHg.   6. No left atrial/left atrial appendage thrombus was detected.   7. 3D performed of the  mitral valve and demonstrates rheumatic mitral  valve.    2D limited echo 10/01/2023 1. Left ventricular ejection fraction, by estimation, is 55 to 60%. The  left ventricle has normal function. The left ventricular internal cavity  size was severely dilated. There is mild left ventricular hypertrophy.   2. Left atrial size was severely dilated.   3. 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 No  evidence of mitral stenosis.   4. The aortic valve is tricuspid. Aortic valve regurgitation is trivial.   5. There is normal pulmonary artery  systolic pressure.   6. The inferior vena cava is normal in size with greater than 50%  respiratory variability, suggesting right atrial pressure of 3 mmHg.    2D echo 09/26/2023 1. Left ventricular ejection fraction, by estimation, is 45 to 50%. The  left ventricle has mildly decreased function. The left ventricle  demonstrates global hypokinesis. The left ventricular internal cavity size  was mildly dilated. There is mild left  ventricular hypertrophy. Left ventricular diastolic parameters are  indeterminate.   2. Right ventricular systolic function is mildly reduced. The right  ventricular size is normal. There is normal pulmonary artery systolic  pressure. The estimated right ventricular systolic pressure is 25.8 mmHg.   3. Left atrial size was severely dilated.   4. 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 No  evidence of mitral stenosis. The mean mitral valve gradient is 11.0 mmHg.  Moderate mitral annular calcification.   5. Tricuspid valve regurgitation is mild to moderate.   6. The aortic valve is tricuspid. Aortic valve regurgitation is mild. No  aortic stenosis is present.   7. There is borderline dilatation of the ascending aorta, measuring 38  mm.   8. The inferior vena cava is normal in size with greater than 50%  respiratory variability, suggesting right atrial pressure of 3 mmHg.    Risk Assessment/Calculations:    CHA2DS2-VASc Score = 3   This indicates a 3.2% annual risk of stroke. The patient's score is based upon: CHF History: 1 HTN History: 1 Diabetes History: 0 Stroke History: 0 Vascular Disease History: 0 Age Score: 0 Gender Score: 1            Physical Exam:   VS:  BP 116/80   Pulse 86   Ht 5\' 7"  (1.702 m)   Wt 195 lb (88.5 kg)   LMP  (LMP Unknown) Comment: neg preg test on 10/04/2023  SpO2 98%   BMI 30.54 kg/m    Wt Readings from Last 3 Encounters:  10/25/23 195 lb (88.5 kg)  10/23/23 197 lb (89.4 kg)  10/22/23 205  lb 7.5 oz (93.2 kg)    GEN: Well nourished, well developed in no acute distress NECK: No JVD; No carotid bruits CARDIAC: RRR, no murmurs, rubs, gallops, crisp mechanical click RESPIRATORY:  Clear with diminished left base to auscultation without rales, wheezing or rhonchi  ABDOMEN: Soft, non-tender, obese, non-distended EXTREMITIES:  No edema; No deformity   ASSESSMENT AND PLAN: .   HFrEF/NICM with recent hospitalization with fluid overload with BNP over 1600.  Chest x-ray revealed bilateral opacities with concern for edema versus infection.  CT of the chest revealed possible bilateral peripheral groundglass opacities.  "Effusion.  Echocardiogram revealed an LVEF of 25-30% and RV severely reduced from previous 45-50%.  She was diuresed during hospitalization.  Started on torsemide 20 mg orally, Entresto 24/26 mg twice daily, Jardiance 10 mg daily, she was continued on spironolactone 12.5  mg daily and carvedilol 3.125 mg twice daily.  BiDil was discontinued.  She has been encouraged to limit fluid intake and given recommendations for heart healthy diet.  She has also been encouraged to avoid prepackaged preprocessed fast foods.  She does have upcoming appointment with advanced heart failure.  Will continue to escalate GDMT as allowed by blood pressure and kidney function.  Patient also already has upcoming echocardiogram ordered for MVR.  Atypical chest pain she continues to have some chest discomfort likely related to surgical intervention.  Last heart catheterization revealed no coronary disease in 09/2023.  Elevated troponins during recent hospitalization was not consistent with ACS.  EKG today revealed sinus rhythm with first-degree AV block with a rate of 86 LVH with early repolarization ST depression and T wave inversions noted in inferior and lateral leads.  No significant changes noted in study.  She is continued on her oxycodone 5 mg 3 times daily as needed for pain.  Paroxysmal atrial  fibrillation status post maze and LAA occlusion 10/07/2023 she is continued on warfarin for CHA2DS2-VASc score of at least 3 for stroke prophylaxis she is also being bridged with Lovenox to get therapeutic INR of 2.5-3.5 for mechanical valve.  Currently she is maintaining sinus rhythm.  She has also been continued on amiodarone 400 mg twice daily for 5 days, 200 mg twice daily for 5 days and then 200 mg daily thereafter.   Mitral regurgitation status post mechanical MVR.  She is continued on aspirin 81 mg daily and Coumadin.  INR was subtherapeutic on arrival to recent hospitalization she was placed on heparin until INR had improved.  Scheduled appointment with Coumadin clinic for updated INR management next week.  Hypertension with blood pressure today 116/80.  She is continued on carvedilol 3.25 mg twice daily, Jardiance 10 mg daily, Entresto 24/26 mg twice daily, spironolactone 12.5 mg daily and torsemide 20 mg daily she has been encouraged to continue to monitor blood pressure 1 to 2 hours postmedication administration as well.  Mixed hyperlipidemia where she is currently continued on rosuvastatin 40 mg daily.  Anxiety where she is continued on BuSpar 5 mg 3 times daily Cymbalta 40 mg daily.  Ongoing management per primary care.  Community-acquired pneumonia where she is recently discharged from [facility] continued with Omnicef 300 mg twice daily for 5 days.  Iron deficiency anemia where transferrin saturation was 8%.  She received Venofer IV 300 g x 2 doses given during recent hospitalization.  On discharge she was continued on Trigel   Cardiac Rehabilitation Eligibility Assessment         Dispo: Patient returns close MD/APP in 6 weeks or sooner if needed for reevaluation of symptoms.  She has labs ordered for next week.  She also has upcoming echocardiogram scheduled.  She was also given information today on heart healthy low-sodium diets.  Signed, Denvil Canning, NP

## 2023-10-25 NOTE — Patient Instructions (Signed)
 Medication Instructions:  No changes *If you need a refill on your cardiac medications before your next appointment, please call your pharmacy*  Lab Work: None ordered If you have labs (blood work) drawn today and your tests are completely normal, you will receive your results only by: MyChart Message (if you have MyChart) OR A paper copy in the mail If you have any lab test that is abnormal or we need to change your treatment, we will call you to review the results.  Testing/Procedures: None ordered  Follow-Up: At [facility], you and your health needs are our priority.  As part of our continuing mission to provide you with exceptional heart care, our providers are all part of one team.  This team includes your primary Cardiologist (physician) and Advanced Practice Providers or APPs (Physician Assistants and Nurse Practitioners) who all work together to provide you with the care you need, when you need it.  Your next appointment:   6 week(s)  Provider:   You may see Julien Nordmann, MD or one of the following Advanced Practice Providers on your designated Care Team:   Charlsie Quest, NP  We will get you set up with the Coumadin Clinic  We recommend signing up for the patient portal called "MyChart".  Sign up information is provided on this After Visit Summary.  MyChart is used to connect with patients for Virtual Visits (Telemedicine).  Patients are able to view lab/test results, encounter notes, upcoming appointments, etc.  Non-urgent messages can be sent to your provider as well.   To learn more about what you can do with MyChart, go to [email or web address].   Other Instructions Heart-Healthy Eating Plan Eating a healthy diet is important for the health of your heart. A heart-healthy eating plan includes: Eating less unhealthy fats. Eating more healthy fats. Eating less salt in your food. Salt is also called sodium. Making other changes in your diet. Talk with your  doctor or a diet specialist (dietitian) to create an eating plan that is right for you. What is my plan? Your doctor may recommend an eating plan that includes: Total fat: ______% or less of total calories a day. Saturated fat: ______% or less of total calories a day. Cholesterol: less than _________mg a day. Sodium: less than _________mg a day. What are tips for following this plan? Cooking Avoid frying your food. Try to bake, boil, grill, or broil it instead. You can also reduce fat by: Removing the skin from poultry. Removing all visible fats from meats. Steaming vegetables in water or broth. Meal planning  At meals, divide your plate into four equal parts: Fill one-half of your plate with vegetables and green salads. Fill one-fourth of your plate with whole grains. Fill one-fourth of your plate with lean protein foods. Eat 2-4 cups of vegetables per day. One cup of vegetables is: 1 cup (91 g) broccoli or cauliflower florets. 2 medium carrots. 1 large bell pepper. 1 large sweet potato. 1 large tomato. 1 medium white potato. 2 cups (150 g) raw leafy greens. Eat 1-2 cups of fruit per day. One cup of fruit is: 1 small apple 1 large banana 1 cup (237 g) mixed fruit, 1 large orange,  cup (82 g) dried fruit, 1 cup (240 mL) 100% fruit juice. Eat more foods that have soluble fiber. These are apples, broccoli, carrots, beans, peas, and barley. Try to get 20-30 g of fiber per day. Eat 4-5 servings of nuts, legumes, and seeds per week: 1 serving of  dried beans or legumes equals  cup (90 g) cooked. 1 serving of nuts is  oz (12 almonds, 24 pistachios, or 7 walnut halves). 1 serving of seeds equals  oz (8 g). General information Eat more home-cooked food. Eat less restaurant, buffet, and fast food. Limit or avoid alcohol. Limit foods that are high in starch and sugar. Avoid fried foods. Lose weight if you are overweight. Keep track of how much salt (sodium) you eat. This is  important if you have high blood pressure. Ask your doctor to tell you more about this. Try to add vegetarian meals each week. Fats Choose healthy fats. These include olive oil and canola oil, flaxseeds, walnuts, almonds, and seeds. Eat more omega-3 fats. These include salmon, mackerel, sardines, tuna, flaxseed oil, and ground flaxseeds. Try to eat fish at least 2 times each week. Check food labels. Avoid foods with trans fats or high amounts of saturated fat. Limit saturated fats. These are often found in animal products, such as meats, butter, and cream. These are also found in plant foods, such as palm oil, palm kernel oil, and coconut oil. Avoid foods with partially hydrogenated oils in them. These have trans fats. Examples are stick margarine, some tub margarines, cookies, crackers, and other baked goods. What foods should I eat? Fruits All fresh, canned (in natural juice), or frozen fruits. Vegetables Fresh or frozen vegetables (raw, steamed, roasted, or grilled). Green salads. Grains Most grains. Choose whole wheat and whole grains most of the time. Rice and pasta, including brown rice and pastas made with whole wheat. Meats and other proteins Lean, well-trimmed beef, veal, pork, and lamb. Chicken and [place] without skin. All fish and shellfish. Wild duck, rabbit, pheasant, and venison. Egg whites or low-cholesterol egg substitutes. Dried beans, peas, lentils, and tofu. Seeds and most nuts. Dairy Low-fat or nonfat cheeses, including ricotta and mozzarella. Skim or 1% milk that is liquid, powdered, or evaporated. Buttermilk that is made with low-fat milk. Nonfat or low-fat yogurt. Fats and oils Non-hydrogenated (trans-free) margarines. Vegetable oils, including soybean, sesame, sunflower, olive, peanut, safflower, corn, canola, and cottonseed. Salad dressings or mayonnaise made with a vegetable oil. Beverages Mineral water. Coffee and tea. Diet carbonated beverages. Sweets and  desserts Sherbet, gelatin, and fruit ice. Small amounts of dark chocolate. Limit all sweets and desserts. Seasonings and condiments All seasonings and condiments. The items listed above may not be a complete list of foods and drinks you can eat. Contact a dietitian for more options. What foods should I avoid? Fruits Canned fruit in heavy syrup. Fruit in cream or butter sauce. Fried fruit. Limit coconut. Vegetables Vegetables cooked in cheese, cream, or butter sauce. Fried vegetables. Grains Breads that are made with saturated or trans fats, oils, or whole milk. Croissants. Sweet rolls. Donuts. High-fat crackers, such as cheese crackers. Meats and other proteins Fatty meats, such as hot dogs, ribs, sausage, bacon, rib-eye roast or steak. High-fat deli meats, such as salami and bologna. Caviar. Domestic duck and goose. Organ meats, such as liver. Dairy Cream, sour cream, cream cheese, and creamed cottage cheese. Whole-milk cheeses. Whole or 2% milk that is liquid, evaporated, or condensed. Whole buttermilk. Cream sauce or high-fat cheese sauce. Yogurt that is made from whole milk. Fats and oils Meat fat, or shortening. Cocoa butter, hydrogenated oils, palm oil, coconut oil, palm kernel oil. Solid fats and shortenings, including bacon fat, salt pork, lard, and butter. Nondairy cream substitutes. Salad dressings with cheese or sour cream. Beverages Regular sodas and juice  drinks with added sugar. Sweets and desserts Frosting. Pudding. Cookies. Cakes. Pies. Milk chocolate or white chocolate. Buttered syrups. Full-fat ice cream or ice cream drinks. The items listed above may not be a complete list of foods and drinks to avoid. Contact a dietitian for more information. Summary Heart-healthy meal planning includes eating less unhealthy fats, eating more healthy fats, and making other changes in your diet. Eat a balanced diet. This includes fruits and vegetables, low-fat or nonfat dairy, lean  protein, nuts and legumes, whole grains, and heart-healthy oils and fats. This information is not intended to replace advice given to you by your health care provider. Make sure you discuss any questions you have with your health care provider. Document Revised: 08/14/2021 Document Reviewed: 08/14/2021 Elsevier Patient Education  2024 [employer].

## 2023-10-28 ENCOUNTER — Encounter: Admitting: Family

## 2023-10-28 ENCOUNTER — Telehealth: Payer: Self-pay | Admitting: Internal Medicine

## 2023-10-28 NOTE — Telephone Encounter (Incomplete)
 Called to confirm/remind patient of their appointment at the [facility] on 10/29/23***.   Appointment:   [x] Confirmed  [] Left mess   [] No answer/No voice mail  [] Phone not in service  Patient reminded to bring all medications and/or complete list.  Confirmed patient has transportation. Gave directions, instructed to utilize valet parking.

## 2023-10-29 ENCOUNTER — Ambulatory Visit (HOSPITAL_BASED_OUTPATIENT_CLINIC_OR_DEPARTMENT_OTHER): Admitting: Internal Medicine

## 2023-10-29 ENCOUNTER — Other Ambulatory Visit: Payer: Self-pay | Admitting: Thoracic Surgery (Cardiothoracic Vascular Surgery)

## 2023-10-29 ENCOUNTER — Other Ambulatory Visit: Payer: Self-pay

## 2023-10-29 ENCOUNTER — Other Ambulatory Visit
Admission: RE | Admit: 2023-10-29 | Discharge: 2023-10-29 | Disposition: A | Discharge status: Home / Self Care | Source: Ambulatory Visit | Attending: Internal Medicine | Admitting: Internal Medicine

## 2023-10-29 VITALS — HR 122

## 2023-10-29 DIAGNOSIS — I4892 Unspecified atrial flutter: Secondary | ICD-10-CM

## 2023-10-29 DIAGNOSIS — I340 Nonrheumatic mitral (valve) insufficiency: Secondary | ICD-10-CM | POA: Diagnosis not present

## 2023-10-29 DIAGNOSIS — Z952 Presence of prosthetic heart valve: Secondary | ICD-10-CM | POA: Insufficient documentation

## 2023-10-29 DIAGNOSIS — I480 Paroxysmal atrial fibrillation: Secondary | ICD-10-CM | POA: Diagnosis not present

## 2023-10-29 DIAGNOSIS — I5021 Acute systolic (congestive) heart failure: Secondary | ICD-10-CM | POA: Diagnosis not present

## 2023-10-29 LAB — CBC
HCT: 43.1 % (ref 36.0–46.0)
Hemoglobin: 13.3 g/dL (ref 12.0–15.0)
MCH: 26.9 pg (ref 26.0–34.0)
MCHC: 30.9 g/dL (ref 30.0–36.0)
MCV: 87.1 fL (ref 80.0–100.0)
Platelets: 345 10*3/uL (ref 150–400)
RBC: 4.95 MIL/uL (ref 3.87–5.11)
RDW: 16.9 % — ABNORMAL HIGH (ref 11.5–15.5)
WBC: 10 10*3/uL (ref 4.0–10.5)
nRBC: 0 % (ref 0.0–0.2)

## 2023-10-29 LAB — COMPREHENSIVE METABOLIC PANEL WITH GFR
ALT: 45 U/L — ABNORMAL HIGH (ref 0–44)
AST: 31 U/L (ref 15–41)
Albumin: 3.7 g/dL (ref 3.5–5.0)
Alkaline Phosphatase: 95 U/L (ref 38–126)
Anion gap: 9 (ref 5–15)
BUN: 18 mg/dL (ref 6–20)
CO2: 26 mmol/L (ref 22–32)
Calcium: 9.3 mg/dL (ref 8.9–10.3)
Chloride: 99 mmol/L (ref 98–111)
Creatinine, Ser: 1.07 mg/dL — ABNORMAL HIGH (ref 0.44–1.00)
GFR, Estimated: >60 mL/min (ref >60)
Glucose, Bld: 169 mg/dL — ABNORMAL HIGH (ref 70–99)
Potassium: 4.1 mmol/L (ref 3.5–5.1)
Sodium: 134 mmol/L — ABNORMAL LOW (ref 135–145)
Total Bilirubin: 0.6 mg/dL (ref 0.0–1.2)
Total Protein: 7.9 g/dL (ref 6.5–8.1)

## 2023-10-29 LAB — PROTIME-INR
INR: 1.7 — ABNORMAL HIGH (ref 0.8–1.2)
Prothrombin Time: 19.8 s — ABNORMAL HIGH (ref 11.4–15.2)

## 2023-10-29 MED ORDER — AMIODARONE HCL 200 MG PO TABS: 200.0000 mg | ORAL_TABLET | Freq: Two times a day (BID) | ORAL | 1 refills | Status: DC

## 2023-10-29 NOTE — Progress Notes (Signed)
 Dccv orders placed

## 2023-10-29 NOTE — Progress Notes (Unsigned)
 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     Admitted to [facility] 09/25/23 with A fib RVR. Echo showed LVEF ef 45-50% severe mitral regurgitation mean MV gradient 11 and mitral valve rheumatic.  TEE 2025 EF 55-60% RV low normal. LHC/RHC no CAD, CO 4.7 and CI 2.3 .Transferred to [facility] for CT surgery consultation. She underwent mechanical mitral valve with MAZE. Developed pauses and sinus arrest post cardioversion. GDMT limited by hyperkalemia. Placed on coumadin. Lasix stopped on the day of discharge due to hyponatremia. Discharged 10/14/23.    After discharge she developed lower extremity edema and weight gain.    Presented to [facility]  10/19/23 with chest pain. CXR small pleural effusion and edema. CT chest - Small-mod left pleural effusion. Possible PNA or inflammation with post surgical changes from open heart surgery.  Bld CX NGTD, respiratory panel negative, BNP 1629, HS Trop 408>490,  WBC 25. Placed on antibiotics for possible PNA. Diuresing with IV lasix.   Echo 4/25  EF down 25-30% RV severely reduced. Diuresed well and discharged home on 10/22/23. Was placed on therapeutic dose lovenox on d/c due to sub therapeutic INR/  Here with her sister-in-law. Says breathing ok. Feels fatigued. Tires easily. No swelling, orthopnea or PND. Compliant with meds. Says shots in her belly are causing bruising.  In clinic found to be back in AFL with RVR   Past Medical History:  Diagnosis Date   Hypertension     Current Outpatient Medications  Medication Sig Dispense Refill   acetaminophen (TYLENOL) 325 MG tablet Take 2 tablets (650 mg total) by mouth every 4 (four) hours as needed for headache or mild pain  (pain score 1-3).     aspirin 81 MG chewable tablet Chew 1 tablet (81 mg total) by mouth daily.     busPIRone (BUSPAR) 5 MG tablet Take 1 tablet (5 mg total) by mouth 3 (three) times daily. 90 tablet 2   carvedilol (COREG) 3.125 MG tablet Take 1 tablet (3.125 mg total) by mouth 2 (two) times daily with a meal. 60 tablet 11   DULoxetine (CYMBALTA) 20 MG capsule Take 2 capsules (40 mg total) by mouth daily. 180 capsule 1   empagliflozin (JARDIANCE) 10 MG TABS tablet Take 1 tablet (10 mg total) by mouth daily. 30 tablet 11   enoxaparin (LOVENOX) 100 MG/ML injection Inject 1 mL (100 mg total) into the skin 2 (two) times daily as directed by Coumadin clinic 14 mL 0   Fe Fum-Vit C-Vit B12-FA (TRIGELS-F FORTE) CAPS capsule Take 1 capsule by mouth daily. (Patient not taking: Reported on 10/29/2023) 30 capsule 5   polyethylene glycol powder (GLYCOLAX/MIRALAX) 17 GM/SCOOP powder Take 17 g by mouth daily. Mix as directed. (Patient taking differently: Take 17 g by mouth daily as needed for moderate constipation.) 238 g 0   rosuvastatin (CRESTOR) 40 MG tablet Take 1 tablet (40 mg total) by mouth daily. 90 tablet 3   sacubitril-valsartan (ENTRESTO) 24-26 MG Take 1 tablet by mouth 2 (two) times daily. 60 tablet 11   spironolactone (ALDACTONE) 25 MG tablet Take 0.5 tablets (12.5 mg total) by mouth daily. 15 tablet 11   torsemide (DEMADEX) 20 MG tablet Take 1 tablet (20 mg  total) by mouth daily. 30 tablet 11   Vitamin D, Ergocalciferol, (DRISDOL) 1.25 MG (50000 UNIT) CAPS capsule Take 1 capsule (50,000 Units total) by mouth every 7 (seven) days. 12 capsule 0   warfarin (COUMADIN) 2.5 MG tablet Take 1 tablet (2.5 mg total) by mouth as directed. Alternating schedule every 3 days, take 2.5 mg daily for 2 days, then 5 mg daily- then repeat 3 day cycle. Then per coumadin clinic (Patient taking differently: Take 2.5 mg by mouth daily.) 100 tablet 1   amiodarone (PACERONE) 200 MG tablet Take 1 tablet (200 mg total) by mouth 2  (two) times daily. 50 tablet 1   oxyCODONE (OXY IR/ROXICODONE) 5 MG immediate release tablet Take 5 mg by mouth 3 (three) times daily as needed for severe pain (pain score 7-10).     No current facility-administered medications for this visit.    Allergies  Allergen Reactions   Penicillins Hives           Social History   Socioeconomic History   Marital status: Single    Spouse name: Not on file   Number of children: 8   Years of education: Not on file   Highest education level: Some college, no degree  Occupational History   Not on file  Tobacco Use   Smoking status: Every Day    Current packs/day: 1.00    Types: Cigarettes   Smokeless tobacco: Never  Vaping Use   Vaping status: Never Used  Substance and Sexual Activity   Alcohol use: No   Drug use: No   Sexual activity: Yes    Birth control/protection: None, Condom  Other Topics Concern   Not on file  Social History Narrative   Not on file   Social Drivers of Health   Financial Resource Strain: Medium Risk (09/30/2023)   Overall Financial Resource Strain (CARDIA)    Difficulty of Paying Living Expenses: Somewhat hard  Food Insecurity: No Food Insecurity (10/20/2023)   Hunger Vital Sign    Worried About Running Out of Food in the Last Year: Never true    Ran Out of Food in the Last Year: Never true  Transportation Needs: No Transportation Needs (10/20/2023)   PRAPARE - Administrator, Civil Service (Medical): No    Lack of Transportation (Non-Medical): No  Physical Activity: Not on file  Stress: Not on file  Social Connections: Moderately Integrated (10/20/2023)   Social Connection and Isolation Panel [NHANES]    Frequency of Communication with Friends and Family: Three times a week    Frequency of Social Gatherings with Friends and Family: Once a week    Attends Religious Services: 1 to 4 times per year    Active Member of [employer] or Organizations: Yes    Attends Banker Meetings: 1 to  4 times per year    Marital Status: Never married  Intimate Partner Violence: Not At Risk (10/20/2023)   Humiliation, Afraid, Rape, and Kick questionnaire    Fear of Current or Ex-Partner: No    Emotionally Abused: No    Physically Abused: No    Sexually Abused: No     No family history on file.   PHYSICAL EXAM: General:  Fatigued appearing. No respiratory difficulty HEENT: normal Neck: supple. no JVD. Carotids 2+ bilat; no bruits. No lymphadenopathy or thryomegaly appreciated. Cor: Sternal wound ok Irregular tachy mech s1 (crisp)  Lungs: clear Abdomen: soft, nontender, nondistended. No hepatosplenomegaly. No bruits or masses. Good bowel sounds. Extremities:  no cyanosis, clubbing, rash, edema Neuro: alert & oriented x 3, cranial nerves grossly intact. moves all 4 extremities w/o difficulty. Affect pleasant.  ECG: AFL 130 Personally reviewed  Wt Readings from Last 3 Encounters:  10/25/23 195 lb (88.5 kg)  10/23/23 197 lb (89.4 kg)  10/22/23 205 lb 7.5 oz (93.2 kg)    ASSESSMENT & PLAN:  1. Acute systolic CHF: S/p mechanical MV replacement.  Pre-op echo with EF 45-50%.  Cath pre-op with no significant CAD. Post-op echo this 4/25  EF 25-30%, mild LV dilation, moderate LVH, mild RV enlargement with mildly decreased systolic function, mechanical MV with mean gradient 11 mmHg, ?mild-moderate AI, IVC not dilated.  I suspect we are seeing the patient's true LV function in setting of higher afterload with severe mitral regurgitation repaired. 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  - Continue Entresto 24/26 bid.  - Continue spironolactone 12.5 mg daily.  - Continue Coreg 3.125 mg bid - Continue Jardiance 10 mg daily.  - labs today  2. 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  Patient had mechanical MV replacement with Maze and LA appendage occlusion on 10/07/23. - Continue warfarin for goal INR 2.5-3.5 - Continue ASA 81  daily.  - Currently on Lovenox. Will check INR today to assess INR.  - Contacted coumadin clinic to arrange f/u for tomorrow (she did not have appointment set up) - appreciate their help. With need for [facility] and need for DC-CV would keep on lovenox until INR >= 2.5 - Endocarditis prophylaxis with dentistry.  - Refer cardiac rehab  3. Atrial fibrillation/flutter: Paroxysmal.  She had Maze procedure with her surgery. - She is in AFL today with RVR - Increase amio to 200 bid - DC-CV this week. We have scheduled  4. Morbid obesity - Consider GLP1RA in the future  Arvilla Meres, MD  11:52 AM

## 2023-10-29 NOTE — H&P (View-Only) (Signed)
 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     Admitted to [facility] 09/25/23 with A fib RVR. Echo showed LVEF ef 45-50% severe mitral regurgitation mean MV gradient 11 and mitral valve rheumatic.  TEE 2025 EF 55-60% RV low normal. LHC/RHC no CAD, CO 4.7 and CI 2.3 .Transferred to [facility] for CT surgery consultation. She underwent mechanical mitral valve with MAZE. Developed pauses and sinus arrest post cardioversion. GDMT limited by hyperkalemia. Placed on coumadin. Lasix stopped on the day of discharge due to hyponatremia. Discharged 10/14/23.    After discharge she developed lower extremity edema and weight gain.    Presented to [facility]  10/19/23 with chest pain. CXR small pleural effusion and edema. CT chest - Small-mod left pleural effusion. Possible PNA or inflammation with post surgical changes from open heart surgery.  Bld CX NGTD, respiratory panel negative, BNP 1629, HS Trop 408>490,  WBC 25. Placed on antibiotics for possible PNA. Diuresing with IV lasix.   Echo 4/25  EF down 25-30% RV severely reduced. Diuresed well and discharged home on 10/22/23. Was placed on therapeutic dose lovenox on d/c due to sub therapeutic INR/  Here with her sister-in-law. Says breathing ok. Feels fatigued. Tires easily. No swelling, orthopnea or PND. Compliant with meds. Says shots in her belly are causing bruising.  In clinic found to be back in AFL with RVR   Past Medical History:  Diagnosis Date   Hypertension     Current Outpatient Medications  Medication Sig Dispense Refill   acetaminophen (TYLENOL) 325 MG tablet Take 2 tablets (650 mg total) by mouth every 4 (four) hours as needed for headache or mild pain  (pain score 1-3).     aspirin 81 MG chewable tablet Chew 1 tablet (81 mg total) by mouth daily.     busPIRone (BUSPAR) 5 MG tablet Take 1 tablet (5 mg total) by mouth 3 (three) times daily. 90 tablet 2   carvedilol (COREG) 3.125 MG tablet Take 1 tablet (3.125 mg total) by mouth 2 (two) times daily with a meal. 60 tablet 11   DULoxetine (CYMBALTA) 20 MG capsule Take 2 capsules (40 mg total) by mouth daily. 180 capsule 1   empagliflozin (JARDIANCE) 10 MG TABS tablet Take 1 tablet (10 mg total) by mouth daily. 30 tablet 11   enoxaparin (LOVENOX) 100 MG/ML injection Inject 1 mL (100 mg total) into the skin 2 (two) times daily as directed by Coumadin clinic 14 mL 0   Fe Fum-Vit C-Vit B12-FA (TRIGELS-F FORTE) CAPS capsule Take 1 capsule by mouth daily. (Patient not taking: Reported on 10/29/2023) 30 capsule 5   polyethylene glycol powder (GLYCOLAX/MIRALAX) 17 GM/SCOOP powder Take 17 g by mouth daily. Mix as directed. (Patient taking differently: Take 17 g by mouth daily as needed for moderate constipation.) 238 g 0   rosuvastatin (CRESTOR) 40 MG tablet Take 1 tablet (40 mg total) by mouth daily. 90 tablet 3   sacubitril-valsartan (ENTRESTO) 24-26 MG Take 1 tablet by mouth 2 (two) times daily. 60 tablet 11   spironolactone (ALDACTONE) 25 MG tablet Take 0.5 tablets (12.5 mg total) by mouth daily. 15 tablet 11   torsemide (DEMADEX) 20 MG tablet Take 1 tablet (20 mg  total) by mouth daily. 30 tablet 11   Vitamin D, Ergocalciferol, (DRISDOL) 1.25 MG (50000 UNIT) CAPS capsule Take 1 capsule (50,000 Units total) by mouth every 7 (seven) days. 12 capsule 0   warfarin (COUMADIN) 2.5 MG tablet Take 1 tablet (2.5 mg total) by mouth as directed. Alternating schedule every 3 days, take 2.5 mg daily for 2 days, then 5 mg daily- then repeat 3 day cycle. Then per coumadin clinic (Patient taking differently: Take 2.5 mg by mouth daily.) 100 tablet 1   amiodarone (PACERONE) 200 MG tablet Take 1 tablet (200 mg total) by mouth 2  (two) times daily. 50 tablet 1   oxyCODONE (OXY IR/ROXICODONE) 5 MG immediate release tablet Take 5 mg by mouth 3 (three) times daily as needed for severe pain (pain score 7-10).     No current facility-administered medications for this visit.    Allergies  Allergen Reactions   Penicillins Hives           Social History   Socioeconomic History   Marital status: Single    Spouse name: Not on file   Number of children: 8   Years of education: Not on file   Highest education level: Some college, no degree  Occupational History   Not on file  Tobacco Use   Smoking status: Every Day    Current packs/day: 1.00    Types: Cigarettes   Smokeless tobacco: Never  Vaping Use   Vaping status: Never Used  Substance and Sexual Activity   Alcohol use: No   Drug use: No   Sexual activity: Yes    Birth control/protection: None, Condom  Other Topics Concern   Not on file  Social History Narrative   Not on file   Social Drivers of Health   Financial Resource Strain: Medium Risk (09/30/2023)   Overall Financial Resource Strain (CARDIA)    Difficulty of Paying Living Expenses: Somewhat hard  Food Insecurity: No Food Insecurity (10/20/2023)   Hunger Vital Sign    Worried About Running Out of Food in the Last Year: Never true    Ran Out of Food in the Last Year: Never true  Transportation Needs: No Transportation Needs (10/20/2023)   PRAPARE - Administrator, Civil Service (Medical): No    Lack of Transportation (Non-Medical): No  Physical Activity: Not on file  Stress: Not on file  Social Connections: Moderately Integrated (10/20/2023)   Social Connection and Isolation Panel [NHANES]    Frequency of Communication with Friends and Family: Three times a week    Frequency of Social Gatherings with Friends and Family: Once a week    Attends Religious Services: 1 to 4 times per year    Active Member of [employer] or Organizations: Yes    Attends Banker Meetings: 1 to  4 times per year    Marital Status: Never married  Intimate Partner Violence: Not At Risk (10/20/2023)   Humiliation, Afraid, Rape, and Kick questionnaire    Fear of Current or Ex-Partner: No    Emotionally Abused: No    Physically Abused: No    Sexually Abused: No     No family history on file.   PHYSICAL EXAM: General:  Fatigued appearing. No respiratory difficulty HEENT: normal Neck: supple. no JVD. Carotids 2+ bilat; no bruits. No lymphadenopathy or thryomegaly appreciated. Cor: Sternal wound ok Irregular tachy mech s1 (crisp)  Lungs: clear Abdomen: soft, nontender, nondistended. No hepatosplenomegaly. No bruits or masses. Good bowel sounds. Extremities:  no cyanosis, clubbing, rash, edema Neuro: alert & oriented x 3, cranial nerves grossly intact. moves all 4 extremities w/o difficulty. Affect pleasant.  ECG: AFL 130 Personally reviewed  Wt Readings from Last 3 Encounters:  10/25/23 195 lb (88.5 kg)  10/23/23 197 lb (89.4 kg)  10/22/23 205 lb 7.5 oz (93.2 kg)    ASSESSMENT & PLAN:  1. Acute systolic CHF: S/p mechanical MV replacement.  Pre-op echo with EF 45-50%.  Cath pre-op with no significant CAD. Post-op echo this 4/25  EF 25-30%, mild LV dilation, moderate LVH, mild RV enlargement with mildly decreased systolic function, mechanical MV with mean gradient 11 mmHg, ?mild-moderate AI, IVC not dilated.  I suspect we are seeing the patient's true LV function in setting of higher afterload with severe mitral regurgitation repaired. 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  - Continue Entresto 24/26 bid.  - Continue spironolactone 12.5 mg daily.  - Continue Coreg 3.125 mg bid - Continue Jardiance 10 mg daily.  - labs today  2. 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  Patient had mechanical MV replacement with Maze and LA appendage occlusion on 10/07/23. - Continue warfarin for goal INR 2.5-3.5 - Continue ASA 81  daily.  - Currently on Lovenox. Will check INR today to assess INR.  - Contacted coumadin clinic to arrange f/u for tomorrow (she did not have appointment set up) - appreciate their help. With need for [facility] and need for DC-CV would keep on lovenox until INR >= 2.5 - Endocarditis prophylaxis with dentistry.  - Refer cardiac rehab  3. Atrial fibrillation/flutter: Paroxysmal.  She had Maze procedure with her surgery. - She is in AFL today with RVR - Increase amio to 200 bid - DC-CV this week. We have scheduled  4. Morbid obesity - Consider GLP1RA in the future  Arvilla Meres, MD  11:52 AM

## 2023-10-29 NOTE — Patient Instructions (Addendum)
 Medication Changes:  DO NOT TAKE EVENING DOSE OF LOVENOX UNTIL WE CONTACT YOU  Make sure you are taking your amiodarone 200mg  tab two times daily  Lab Work:  Go over to the MEDICAL MALL. Go pass the gift shop and have your blood work completed.  We will only call you if the results are abnormal or if the provider would like to make medication changes.   Testing/Procedures:  Your physician has recommended that you have a Cardioversion (DCCV). Electrical Cardioversion uses a jolt of electricity to your heart either through paddles or wired patches attached to your chest. This is a controlled, usually prescheduled, procedure. Defibrillation is done under light anesthesia in the hospital, and you usually go home the day of the procedure. This is done to get your heart back into a normal rhythm. You are not awake for the procedure. Please see the instruction sheet given to you today.   Special Instructions // Education:  You are scheduled for a Cardioversion on __Friday April 11th___ with Dr.__Bensimhon___ Please arrive at the [facility] of [facility] at ___6:30__ a.m. on the day of your procedure.  DIET INSTRUCTIONS:  Nothing to eat or drink after midnight except your medications with a sip of water.  Do not take : Jardiance until after your procedure. Do not take : spironolactone and torsemide the morning of your procedure. You may restart afterwards.         Labs: ___4/8/25____  Medications:  YOU MAY TAKE ALL of your remaining medications with a small amount of water.  Must have a responsible person to drive you home.  Bring a current list of your medications and current insurance cards.    If you have any questions after you get home, please call the office at [phone]  Follow-Up in: Please follow up with the [facility] in 1 month with Dr. Gala Romney  At the [facility], you and your health needs are our priority. We have a designated team  specialized in the treatment of Heart Failure. This Care Team includes your primary Heart Failure Specialized Cardiologist (physician), Advanced Practice Providers (APPs- Physician Assistants and Nurse Practitioners), and Pharmacist who all work together to provide you with the care you need, when you need it.   You may see any of the following providers on your designated Care Team at your next follow up:  Dr. Arvilla Meres Dr. Marca Ancona Dr. Dorthula Nettles Dr. Theresia Bough Clarisa Kindred, FNP Enos Fling, RPH-CPP  Please be sure to bring in all your medications bottles to every appointment.   Need to Contact us:  If you have any questions or concerns before your next appointment please send [place] a message through [place] or call our office at [phone].    TO LEAVE A MESSAGE FOR THE NURSE SELECT OPTION 2, PLEASE LEAVE A MESSAGE INCLUDING: YOUR NAME DATE OF BIRTH CALL BACK NUMBER REASON FOR CALL**this is important as we prioritize the call backs  YOU WILL RECEIVE A CALL BACK THE SAME DAY AS LONG AS YOU CALL BEFORE 4:00 PM

## 2023-10-30 ENCOUNTER — Encounter

## 2023-10-30 ENCOUNTER — Encounter: Payer: Self-pay | Admitting: Internal Medicine

## 2023-10-30 ENCOUNTER — Ambulatory Visit: Attending: Internal Medicine

## 2023-10-30 ENCOUNTER — Ambulatory Visit (INDEPENDENT_AMBULATORY_CARE_PROVIDER_SITE_OTHER): Admitting: Family Medicine

## 2023-10-30 ENCOUNTER — Other Ambulatory Visit: Payer: Self-pay

## 2023-10-30 ENCOUNTER — Encounter: Payer: Self-pay | Admitting: Family Medicine

## 2023-10-30 VITALS — BP 107/85 | HR 70 | Ht 67.0 in | Wt 194.0 lb

## 2023-10-30 DIAGNOSIS — I4891 Unspecified atrial fibrillation: Secondary | ICD-10-CM

## 2023-10-30 DIAGNOSIS — R110 Nausea: Secondary | ICD-10-CM | POA: Diagnosis not present

## 2023-10-30 DIAGNOSIS — Z09 Encounter for follow-up examination after completed treatment for conditions other than malignant neoplasm: Secondary | ICD-10-CM

## 2023-10-30 DIAGNOSIS — Z952 Presence of prosthetic heart valve: Secondary | ICD-10-CM | POA: Diagnosis not present

## 2023-10-30 DIAGNOSIS — I480 Paroxysmal atrial fibrillation: Secondary | ICD-10-CM

## 2023-10-30 DIAGNOSIS — I10 Essential (primary) hypertension: Secondary | ICD-10-CM

## 2023-10-30 DIAGNOSIS — F5102 Adjustment insomnia: Secondary | ICD-10-CM

## 2023-10-30 DIAGNOSIS — Z5181 Encounter for therapeutic drug level monitoring: Secondary | ICD-10-CM

## 2023-10-30 DIAGNOSIS — I5021 Acute systolic (congestive) heart failure: Secondary | ICD-10-CM

## 2023-10-30 DIAGNOSIS — F39 Unspecified mood [affective] disorder: Secondary | ICD-10-CM | POA: Diagnosis not present

## 2023-10-30 LAB — POCT INR: INR: 1.7 — AB (ref 2.0–3.0)

## 2023-10-30 MED ORDER — BUSPIRONE HCL 7.5 MG PO TABS: 7.5000 mg | ORAL_TABLET | Freq: Two times a day (BID) | ORAL | 1 refills | Status: DC

## 2023-10-30 MED ORDER — TRAMADOL HCL 50 MG PO TABS: 50.0000 mg | ORAL_TABLET | Freq: Four times a day (QID) | ORAL | 0 refills | Status: AC | PRN

## 2023-10-30 MED ORDER — ONDANSETRON HCL 4 MG PO TABS: 4.0000 mg | ORAL_TABLET | Freq: Three times a day (TID) | ORAL | 0 refills | Status: AC | PRN

## 2023-10-30 MED ORDER — ALPRAZOLAM 0.25 MG PO TABS: 0.2500 mg | ORAL_TABLET | Freq: Every evening | ORAL | 0 refills | Status: DC | PRN

## 2023-10-30 NOTE — Patient Instructions (Signed)
 Continue Lovenox Injections until Friday. Take 2 tablets today only then Increase to 1 tablet Daily except 2 tablets on Mondays and Fridays.  INR in 1 week  [phone]  A full discussion of the nature of anticoagulants has been carried out.  A benefit risk analysis has been presented to the patient, so that they understand the justification for choosing anticoagulation at this time. The need for frequent and regular monitoring, precise dosage adjustment and compliance is stressed.  Side effects of potential bleeding are discussed.  The patient should avoid any OTC items containing aspirin or ibuprofen, and should avoid great swings in general diet.  Avoid alcohol consumption.  Call if any signs of abnormal bleeding.

## 2023-10-30 NOTE — Progress Notes (Signed)
 Established patient visit   Patient: Sandra Hines   DOB: September 02, 1970   53 y.o. Female  MRN: [id number] Visit Date: 10/30/2023  Today's healthcare provider: Ronnald Ramp, MD   Chief Complaint  Patient presents with   Hospitalization Follow-up    She would like to discuss treatment to help her sleep and she needs a nurse aid form completed    Subjective     HPI     Hospitalization Follow-up    Additional comments: She would like to discuss treatment to help her sleep and she needs a nurse aid form completed       Last edited by Thedora Hinders, CMA on 10/30/2023  1:18 PM.       Discussed the use of AI scribe software for clinical note transcription with the patient, who gave verbal consent to proceed.  History of Present Illness Sandra Hines is a 53 year old female with new onset atrial fibrillation and heart failure who presents for hospital follow-up. She is accompanied by her sister-in-law, Yasmin.  She was initially admitted to the hospital on September 25, 2023, for new onset atrial fibrillation in the context of acute bronchitis and was discharged on October 03, 2023. She was readmitted on October 19, 2023, for recurrence of heart failure and discharged on October 23, 2023. She feels sore and tired but is grateful to be alive. Her brother and sister-in-law ensure she avoids exertion, as she fell while getting out of the shower after her initial discharge.  She is on several medications for her heart failure and atrial fibrillation, including amiodarone 200 mg twice daily, carvedilol 3.125 mg twice daily, Entresto 24/26 mg twice daily, spironolactone 12.5 mg daily, torsemide 20 mg daily, and warfarin 2.5 mg daily. She follows with the Coumadin clinic for INR monitoring. She is not taking Jardiance 10 mg daily or Lovenox injections due to upcoming procedures. She underwent a mitral valve repair and is on Crestor 40 mg daily for her history of heart failure. During her  hospitalization, an echocardiogram showed no significant coronary artery disease. She is scheduled for cardioversion, and her INR was 3.1, leading to adjustments in her warfarin regimen.  She experiences insomnia and is seeking assistance to help her sleep. Oxycodone was prescribed for pain, but she stopped taking it due to constipation. She currently uses Tylenol for pain, which she finds ineffective. She is on Buspar 5 mg three times daily for anxiety, but it does not help her sleep or significantly alleviate her anxiety. She also takes Cymbalta 20 mg daily for mood disorder.  She reports nausea, lack of appetite, and weakness, describing herself as 'just weak, dry'. She experiences soreness at the site of her surgical stitches, which is improving but still painful when she coughs or sneezes. She also mentions experiencing night sweats and hot flashes, which she attributes to her current health condition and medications.     Past Medical History:  Diagnosis Date   Hypertension     Medications: Outpatient Medications Prior to Visit  Medication Sig   acetaminophen (TYLENOL) 325 MG tablet Take 2 tablets (650 mg total) by mouth every 4 (four) hours as needed for headache or mild pain (pain score 1-3).   amiodarone (PACERONE) 200 MG tablet Take 1 tablet (200 mg total) by mouth 2 (two) times daily.   aspirin 81 MG chewable tablet Chew 1 tablet (81 mg total) by mouth daily.   carvedilol (COREG) 3.125 MG tablet Take 1  tablet (3.125 mg total) by mouth 2 (two) times daily with a meal.   DULoxetine (CYMBALTA) 20 MG capsule Take 2 capsules (40 mg total) by mouth daily.   Fe Fum-Vit C-Vit B12-FA (TRIGELS-F FORTE) CAPS capsule Take 1 capsule by mouth daily.   oxyCODONE (OXY IR/ROXICODONE) 5 MG immediate release tablet Take 5 mg by mouth 3 (three) times daily as needed for severe pain (pain score 7-10).   polyethylene glycol powder (GLYCOLAX/MIRALAX) 17 GM/SCOOP powder Take 17 g by mouth daily. Mix as  directed. (Patient taking differently: Take 17 g by mouth daily as needed for moderate constipation.)   rosuvastatin (CRESTOR) 40 MG tablet Take 1 tablet (40 mg total) by mouth daily.   sacubitril-valsartan (ENTRESTO) 24-26 MG Take 1 tablet by mouth 2 (two) times daily.   spironolactone (ALDACTONE) 25 MG tablet Take 0.5 tablets (12.5 mg total) by mouth daily.   torsemide (DEMADEX) 20 MG tablet Take 1 tablet (20 mg total) by mouth daily.   Vitamin D, Ergocalciferol, (DRISDOL) 1.25 MG (50000 UNIT) CAPS capsule Take 1 capsule (50,000 Units total) by mouth every 7 (seven) days.   warfarin (COUMADIN) 2.5 MG tablet Take 1 tablet (2.5 mg total) by mouth as directed. Alternating schedule every 3 days, take 2.5 mg daily for 2 days, then 5 mg daily- then repeat 3 day cycle. Then per coumadin clinic (Patient taking differently: Take 2.5 mg by mouth daily.)   [DISCONTINUED] busPIRone (BUSPAR) 5 MG tablet Take 1 tablet (5 mg total) by mouth 3 (three) times daily.   empagliflozin (JARDIANCE) 10 MG TABS tablet Take 1 tablet (10 mg total) by mouth daily. (Patient not taking: Reported on 10/30/2023)   enoxaparin (LOVENOX) 100 MG/ML injection Inject 1 mL (100 mg total) into the skin 2 (two) times daily as directed by Coumadin clinic (Patient not taking: Reported on 10/30/2023)   No facility-administered medications prior to visit.    Review of Systems  Last metabolic panel Lab Results  Component Value Date   GLUCOSE 169 (H) 10/29/2023   NA 134 (L) 10/29/2023   K 4.1 10/29/2023   CL 99 10/29/2023   CO2 26 10/29/2023   BUN 18 10/29/2023   CREATININE 1.07 (H) 10/29/2023   GFRNONAA >60 10/29/2023   CALCIUM 9.3 10/29/2023   PHOS 3.5 10/22/2023   PROT 7.9 10/29/2023   ALBUMIN 3.7 10/29/2023   LABGLOB 2.7 07/19/2023   BILITOT 0.6 10/29/2023   ALKPHOS 95 10/29/2023   AST 31 10/29/2023   ALT 45 (H) 10/29/2023   ANIONGAP 9 10/29/2023   Last lipids Lab Results  Component Value Date   CHOL 175 09/26/2023    HDL 41 09/26/2023   LDLCALC 118 (H) 09/26/2023   TRIG 79 09/26/2023   CHOLHDL 4.3 09/26/2023   Last hemoglobin A1c Lab Results  Component Value Date   HGBA1C 5.8 (H) 07/19/2023   Last thyroid functions Lab Results  Component Value Date   TSH 1.091 09/25/2023        Objective    BP 107/85   Pulse 70   Ht 5\' 7"  (1.702 m)   Wt 194 lb (88 kg)   LMP  (LMP Unknown) Comment: neg preg test on 10/04/2023  SpO2 100%   BMI 30.38 kg/m   BP Readings from Last 3 Encounters:  10/30/23 107/85  10/25/23 116/80  10/23/23 122/86   Wt Readings from Last 3 Encounters:  10/30/23 194 lb (88 kg)  10/25/23 195 lb (88.5 kg)  10/23/23 197 lb (89.4 kg)  Physical Exam Constitutional:      Comments: Tired appearing female, pleasant demeanor in NAD, seated on exam table   Cardiovascular:     Rate and Rhythm: Regular rhythm. Tachycardia present.     Heart sounds: Murmur heard.     Comments: Mitral valve click (s/p MV replacement) Pulmonary:     Effort: Pulmonary effort is normal. No respiratory distress.     Breath sounds: No wheezing, rhonchi or rales.  Musculoskeletal:     Right lower leg: No edema.     Left lower leg: No edema.  Skin:      Neurological:     Mental Status: She is alert.       Results for orders placed or performed in visit on 10/30/23  POCT INR  Result Value Ref Range   INR 1.7 (A) 2.0 - 3.0   POC INR      Assessment & Plan     Problem List Items Addressed This Visit       Cardiovascular and Mediastinum   Primary hypertension - Primary   Relevant Orders   AMB Referral VBCI Care Management   Atrial fibrillation (HCC)   Relevant Orders   AMB Referral VBCI Care Management   Acute HFrEF (heart failure with reduced ejection fraction) (HCC)   Relevant Orders   AMB Referral VBCI Care Management     Other   S/P MVR (mitral valve replacement)   Relevant Medications   traMADol (ULTRAM) 50 MG tablet   Mood disorder (HCC)   Relevant Medications    busPIRone (BUSPAR) 7.5 MG tablet   Other Relevant Orders   AMB Referral VBCI Care Management   Other Visit Diagnoses       Hospital discharge follow-up         Adjustment insomnia       Relevant Medications   ALPRAZolam (XANAX) 0.25 MG tablet     Nausea       Relevant Medications   ondansetron (ZOFRAN) 4 MG tablet        Assessment & Plan Atrial Fibrillation Recent diagnosis of atrial fibrillation, currently on warfarin therapy with INR monitoring and dosage adjustments to maintain therapeutic range. Scheduled for cardioversion with medication adjustments prior to procedure. Lovenox injections managed based on INR levels. - Continue warfarin 2.5 mg daily with adjusted dosing to manage INR - Monitor INR levels before cardioversion - Hold specific medications as advised by cardiology/EP before cardioversion - Restart Lovenox injections as per INR levels  Heart Failure with Reduced Ejection Fraction Heart failure with reduced ejection fraction, managed with amiodarone, carvedilol, Entresto, spironolactone, and torsemide. Recent mitral valve repair with fluid management issues leading to hospital readmissions. Referral to value-based care institute recommended for chronic care management, involving pharmacy for disease management and polypharmacy, and social work for anxiety and stress management. - Continue amiodarone 200 mg twice daily - Continue carvedilol 3.125 mg twice daily - Continue Entresto 24/26 mg twice daily - Continue spironolactone 12.5 mg daily - Continue torsemide 20 mg daily - Refer to value-based care institute for chronic care management - Involve pharmacy for disease management and polypharmacy - Involve social work for anxiety and stress management  Insomnia Reports difficulty sleeping. Previously prescribed trazodone was stopped due to potential interactions. Considering low-dose trazodone for sleep issues. Alternatives such as hydroxyzine or Xanax  discussed, with preference for Xanax due to previous use without adverse effects. - Prescribe trazodone 25-50 mg as needed at bedtime for insomnia - Prescribe Xanax 0.25 mg at  bedtime as needed for sleep  Mood Disorder Currently on Buspar and Cymbalta for mood disorder. Reports ongoing anxiety and crying spells, indicating current regimen may not be fully effective. Discussed increasing Buspar dose for better anxiety management. Social work involvement recommended for additional support. - Increase Buspar to 7.5 mg twice daily - Involve social work for additional support with anxiety  Pain Management Experiences pain related to recent surgery. Prefers not to take oxycodone due to side effects. Tylenol insufficient for pain management. Discussed tramadol as a step-down from oxycodone. - Prescribe tramadol 50-200 mg as needed every six hours for pain  Nausea Reports nausea and lack of appetite. Discussed Zofran for nausea management. - Prescribe Zofran 4 mg for nausea  Follow-up Scheduled for follow-up in six weeks. Rescheduling any appointments set for the following week to allow recovery and stabilization. - Schedule follow-up appointment in six weeks - Reschedule any appointments set for the following week   Return in about 6 weeks (around 12/11/2023) for CHRONIC F/U.       Ronnald Ramp, MD  [facility] [phone] (phone) [phone] (fax)  [facility]

## 2023-10-31 ENCOUNTER — Other Ambulatory Visit (HOSPITAL_COMMUNITY): Payer: Self-pay

## 2023-10-31 ENCOUNTER — Telehealth: Payer: Self-pay | Admitting: Pharmacist

## 2023-10-31 ENCOUNTER — Other Ambulatory Visit: Payer: Self-pay

## 2023-10-31 MED ORDER — ENOXAPARIN SODIUM 100 MG/ML IJ SOSY
100.0000 mg | PREFILLED_SYRINGE | Freq: Two times a day (BID) | INTRAMUSCULAR | 0 refills | Status: DC
  Filled 2023-10-31: qty 3, 2d supply, fill #0

## 2023-10-31 MED ORDER — ENOXAPARIN SODIUM 100 MG/ML IJ SOSY
100.0000 mg | PREFILLED_SYRINGE | Freq: Two times a day (BID) | INTRAMUSCULAR | 0 refills | Status: DC
  Filled 2023-10-31: qty 14, 7d supply, fill #0

## 2023-10-31 MED ORDER — ENOXAPARIN SODIUM 100 MG/ML IJ SOSY
100.0000 mg | PREFILLED_SYRINGE | Freq: Two times a day (BID) | INTRAMUSCULAR | 3 refills | Status: DC
  Filled 2023-10-31: qty 14, 7d supply, fill #0

## 2023-10-31 MED ORDER — SODIUM CHLORIDE 0.9 % IV SOLN: INTRAVENOUS | Status: DC

## 2023-10-31 NOTE — Progress Notes (Signed)
 Pt ran out of lovenox prior to cardioversion tomorrow.  Per Dr. Gala Romney, patient needs one dose of lovenox today, one dose tonight, and one dose tomorrow morning prior to cardioversion. If pt misses any dose, she will be unable to proceed with cardioversion tomorrow.  Rx sent to [facility] and pt verbalized understanding and agreement. She will notify clinic if there are any issues picking up the lovenox from the pharmacy.

## 2023-10-31 NOTE — Telephone Encounter (Signed)
 Patient requested more Lovenox injections. Clinic staff sent 3 syringes, however, given INR remains 1.7 and more may be required, the prescription was increased to 14 syringes.

## 2023-11-01 ENCOUNTER — Ambulatory Visit: Admitting: Certified Registered"

## 2023-11-01 ENCOUNTER — Encounter
Admission: RE | Disposition: A | Discharge status: Home / Self Care | Payer: Self-pay | Source: Home / Self Care | Attending: Internal Medicine

## 2023-11-01 ENCOUNTER — Other Ambulatory Visit: Payer: Self-pay

## 2023-11-01 ENCOUNTER — Ambulatory Visit
Admission: RE | Admit: 2023-11-01 | Discharge: 2023-11-01 | Disposition: A | Discharge status: Home / Self Care | Attending: Internal Medicine | Admitting: Internal Medicine

## 2023-11-01 ENCOUNTER — Encounter: Payer: Self-pay | Admitting: Internal Medicine

## 2023-11-01 DIAGNOSIS — R011 Cardiac murmur, unspecified: Secondary | ICD-10-CM | POA: Diagnosis not present

## 2023-11-01 DIAGNOSIS — F419 Anxiety disorder, unspecified: Secondary | ICD-10-CM | POA: Insufficient documentation

## 2023-11-01 DIAGNOSIS — I440 Atrioventricular block, first degree: Secondary | ICD-10-CM | POA: Diagnosis not present

## 2023-11-01 DIAGNOSIS — E875 Hyperkalemia: Secondary | ICD-10-CM | POA: Insufficient documentation

## 2023-11-01 DIAGNOSIS — I480 Paroxysmal atrial fibrillation: Secondary | ICD-10-CM

## 2023-11-01 DIAGNOSIS — M1990 Unspecified osteoarthritis, unspecified site: Secondary | ICD-10-CM | POA: Insufficient documentation

## 2023-11-01 DIAGNOSIS — Z5986 Financial insecurity: Secondary | ICD-10-CM | POA: Diagnosis not present

## 2023-11-01 DIAGNOSIS — I5021 Acute systolic (congestive) heart failure: Secondary | ICD-10-CM | POA: Insufficient documentation

## 2023-11-01 DIAGNOSIS — Z6831 Body mass index (BMI) 31.0-31.9, adult: Secondary | ICD-10-CM | POA: Diagnosis not present

## 2023-11-01 DIAGNOSIS — Z7901 Long term (current) use of anticoagulants: Secondary | ICD-10-CM | POA: Insufficient documentation

## 2023-11-01 DIAGNOSIS — E871 Hypo-osmolality and hyponatremia: Secondary | ICD-10-CM | POA: Insufficient documentation

## 2023-11-01 DIAGNOSIS — F32A Depression, unspecified: Secondary | ICD-10-CM | POA: Diagnosis not present

## 2023-11-01 DIAGNOSIS — Z952 Presence of prosthetic heart valve: Secondary | ICD-10-CM | POA: Diagnosis not present

## 2023-11-01 DIAGNOSIS — Z87891 Personal history of nicotine dependence: Secondary | ICD-10-CM | POA: Diagnosis not present

## 2023-11-01 DIAGNOSIS — I4892 Unspecified atrial flutter: Secondary | ICD-10-CM

## 2023-11-01 DIAGNOSIS — I5033 Acute on chronic diastolic (congestive) heart failure: Secondary | ICD-10-CM | POA: Diagnosis not present

## 2023-11-01 DIAGNOSIS — J90 Pleural effusion, not elsewhere classified: Secondary | ICD-10-CM | POA: Insufficient documentation

## 2023-11-01 DIAGNOSIS — I110 Hypertensive heart disease with heart failure: Secondary | ICD-10-CM | POA: Diagnosis not present

## 2023-11-01 DIAGNOSIS — I340 Nonrheumatic mitral (valve) insufficiency: Secondary | ICD-10-CM | POA: Insufficient documentation

## 2023-11-01 DIAGNOSIS — I4891 Unspecified atrial fibrillation: Secondary | ICD-10-CM | POA: Diagnosis not present

## 2023-11-01 HISTORY — PX: CARDIOVERSION: SHX1299

## 2023-11-01 SURGERY — CARDIOVERSION
Anesthesia: General

## 2023-11-01 MED ORDER — LIDOCAINE HCL (CARDIAC) PF 100 MG/5ML IV SOSY
PREFILLED_SYRINGE | INTRAVENOUS | Status: DC | PRN
  Administered 2023-11-01: 100 mg via INTRAVENOUS

## 2023-11-01 MED ORDER — PROPOFOL 10 MG/ML IV BOLUS
INTRAVENOUS | Status: DC | PRN
  Administered 2023-11-01: 70 mg via INTRAVENOUS

## 2023-11-01 MED ORDER — PROPOFOL 1000 MG/100ML IV EMUL
INTRAVENOUS | Status: AC
  Filled 2023-11-01: qty 100

## 2023-11-01 NOTE — Interval H&P Note (Signed)
 History and Physical Interval Note:  11/01/2023 7:33 AM  Sandra Hines  has presented today for surgery, with the diagnosis of Cardioversion   Afib.  The various methods of treatment have been discussed with the patient and family. After consideration of risks, benefits and other options for treatment, the patient has consented to  Procedure(s): CARDIOVERSION (N/A) as a surgical intervention.  The patient's history has been reviewed, patient examined, no change in status, stable for surgery.  I have reviewed the patient's chart and labs.  Questions were answered to the patient's satisfaction.     Jaelyn Cloninger

## 2023-11-01 NOTE — Anesthesia Preprocedure Evaluation (Addendum)
 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)  Rhythm:Irregular Rate:Tachycardia     Neuro/Psych  PSYCHIATRIC DISORDERS Anxiety Depression    negative neurological ROS     GI/Hepatic negative GI ROS,,,  Endo/Other  Obesity   Renal/GU negative Renal ROS     Musculoskeletal  (+) Arthritis ,    Abdominal   Peds  Hematology negative hematology ROS (+)   Anesthesia Other Findings   Reproductive/Obstetrics                             Anesthesia Physical Anesthesia Plan  ASA: 3  Anesthesia Plan: General   Post-op Pain Management:    Induction: Intravenous  PONV Risk Score and Plan: 2 and Propofol infusion, TIVA and Treatment may vary due to age or medical condition  Airway Management Planned: Natural Airway  Additional Equipment:   Intra-op Plan:   Post-operative Plan:   Informed Consent: I have reviewed the patients History and Physical, chart, labs and discussed the procedure including the risks, benefits and alternatives for the proposed anesthesia with the patient or authorized representative who has indicated his/her understanding and acceptance.       Plan Discussed with: CRNA  Anesthesia Plan Comments: (LMA/GETA backup discussed.  Patient consented for risks of anesthesia including but not limited to:  - adverse reactions to medications - damage to  eyes, teeth, lips or other oral mucosa - nerve damage due to positioning  - sore throat or hoarseness - damage to heart, brain, nerves, lungs, other parts of body or loss of life  Informed patient about role of CRNA in peri- and intra-operative care.  Patient voiced understanding.)        Anesthesia Quick Evaluation

## 2023-11-01 NOTE — Anesthesia Postprocedure Evaluation (Signed)
 Anesthesia Post Note  Patient: Sandra Hines  Procedure(s) Performed: CARDIOVERSION  Patient location during evaluation: PACU Anesthesia Type: General Level of consciousness: awake and alert, oriented and patient cooperative Pain management: pain level controlled Vital Signs Assessment: post-procedure vital signs reviewed and stable Respiratory status: spontaneous breathing, nonlabored ventilation and respiratory function stable Cardiovascular status: blood pressure returned to baseline and stable Postop Assessment: adequate PO intake Anesthetic complications: no   No notable events documented.   Last Vitals:  Vitals:   11/01/23 0800 11/01/23 0815  BP: 102/79 113/83  Pulse: 86 92  Resp: 17 18  Temp:    SpO2: 98% 100%    Last Pain:  Vitals:   11/01/23 0815  TempSrc:   PainSc: 0-No pain                 Reed Breech

## 2023-11-01 NOTE — Anesthesia Procedure Notes (Signed)
 Procedure Name: MAC Date/Time: 11/01/2023 7:25 AM  Performed by: Cheral Bay, CRNAPre-anesthesia Checklist: Patient identified, Emergency Drugs available, Suction available, Patient being monitored and Timeout performed Patient Re-evaluated:Patient Re-evaluated prior to induction Oxygen Delivery Method: Nasal cannula Induction Type: IV induction Placement Confirmation: positive ETCO2 and CO2 detector

## 2023-11-01 NOTE — CV Procedure (Signed)
    DIRECT CURRENT CARDIOVERSION  NAME:  Sandra Hines   MRN: [id number] DOB:  10/20/1970   ADMIT DATE: 11/01/2023   INDICATIONS: Atrial flutter    PROCEDURE:   Informed consent was obtained prior to the procedure. The risks, benefits and alternatives for the procedure were discussed and the patient comprehended these risks. Once an appropriate time out was taken, the patient had the defibrillator pads placed in the anterior and posterior position. The patient then underwent sedation by the anesthesia service. Once an appropriate level of sedation was achieved, the patient received a single biphasic, synchronized 150J shock with prompt conversion to sinus rhythm. No apparent complications.  Arvilla Meres, MD  7:37 AM

## 2023-11-01 NOTE — Transfer of Care (Signed)
 Immediate Anesthesia Transfer of Care Note  Patient: Sandra Hines  Procedure(s) Performed: CARDIOVERSION  Patient Location: PACU and Cath Lab  Anesthesia Type:General  Level of Consciousness: drowsy  Airway & Oxygen Therapy: Patient Spontanous Breathing and Patient connected to nasal cannula oxygen  Post-op Assessment: Report given to RN and Post -op Vital signs reviewed and stable  Post vital signs: Reviewed and stable  Last Vitals:  Vitals Value Taken Time  BP 91/71   Temp    Pulse 85   Resp 17   SpO2 99     Last Pain:  Vitals:   11/01/23 0715  TempSrc: Oral  PainSc: 0-No pain         Complications: No notable events documented.

## 2023-11-04 ENCOUNTER — Encounter: Payer: Self-pay | Admitting: Internal Medicine

## 2023-11-05 ENCOUNTER — Inpatient Hospital Stay: Admitting: Family Medicine

## 2023-11-06 ENCOUNTER — Telehealth: Payer: Self-pay

## 2023-11-06 ENCOUNTER — Ambulatory Visit: Attending: Cardiovascular Disease

## 2023-11-06 ENCOUNTER — Other Ambulatory Visit: Payer: Self-pay

## 2023-11-06 ENCOUNTER — Ambulatory Visit: Payer: Self-pay

## 2023-11-06 ENCOUNTER — Telehealth: Payer: Self-pay | Admitting: Cardiovascular Disease

## 2023-11-06 DIAGNOSIS — Z952 Presence of prosthetic heart valve: Secondary | ICD-10-CM | POA: Diagnosis not present

## 2023-11-06 DIAGNOSIS — I4891 Unspecified atrial fibrillation: Secondary | ICD-10-CM

## 2023-11-06 DIAGNOSIS — Z5181 Encounter for therapeutic drug level monitoring: Secondary | ICD-10-CM | POA: Diagnosis not present

## 2023-11-06 LAB — POCT INR: INR: 2 (ref 2.0–3.0)

## 2023-11-06 NOTE — Telephone Encounter (Signed)
 Spoke with [place] from [facility]. Patient had a recent Cardioversion and per [place] Dr. Julane Ny would like an EKG. [place] states that they will see the patient in the [facility] and complete the EKG after she is seen in the Coumadin Clinic.

## 2023-11-06 NOTE — Telephone Encounter (Signed)
Please see the message below and advise.

## 2023-11-06 NOTE — Telephone Encounter (Signed)
 Caller [place]) stated patient will be seen this afternoon in the Coumadin clinic and want patient to have a EKG at that visit.

## 2023-11-06 NOTE — Telephone Encounter (Signed)
 Pt arrived for EKG check. Pt feeling "ok" denies swelling and shortness of breath.

## 2023-11-06 NOTE — Patient Instructions (Signed)
 Take another 0.5 tablet today only then Increase to 2 tablets Daily except 1 tablet on Mondays, Wednesdays and Fridays.  INR in 1 week  [phone]

## 2023-11-06 NOTE — Telephone Encounter (Signed)
  Chief Complaint: Pain, refill oxycodone Symptoms: pain Frequency: constant Pertinent Negatives: Patient denies chest pain, breathing difficulty, vision changes.  Disposition: [] ED /[] Urgent Care (no appt availability in office) / [x] Appointment(In office/virtual)/ []  [facility]/ [] Home Care/ [] Refused Recommended Disposition /[] [facility]/ [x]  Follow-up with PCP Additional Notes:  Calling for refill of Oxycodone. She is on pain management for chronic head and shoulder pain. Has new onset pain below left breast at bra line, felt that bra caused the pain and has removed bra which helped ease pain but it is still tender, not radiating.  She has cardiology appointment tomorrow. Denies chest pain except when she is laying down will feel central chest pain, she also has central chest pain when coughing. She is sneezing a lot, feels like seasonal allergies. Mild cough of "slimy sputum'. Acute visit advised to evaluate new pain. Patient agreeable to acute visit but only with Dr. Saralee Cummins, next available with Dr. Saralee Cummins is on April 24th. Patient declines alternate providers. She has coumadin clinic appointment today and cardiology appointment tomorrow, she is requesting to see Dr. Saralee Cummins on Friday if possible or if prescription can be sent in and have cardiology evaluate pain tomorrow. Please follow up with [place] re: Dr. Saralee Cummins availability and requested oxycodone refill.   Copied from CRM [id number]. Topic: Clinical - Red Word Triage >> Nov 06, 2023  1:06 PM Tiffany B wrote: [facility] that prompted transfer to Nurse Triage: Patient experiencing right side pain near breast. Requesting oxyCODONE (OXY IR/ROXICODONE) 5 MG immediate release tablet because Tramadol not working. Reason for Disposition  [1] Chest pain(s) lasting a few seconds AND [2] persists > 3 days  Protocols used: Chest Pain-A-AH

## 2023-11-07 ENCOUNTER — Other Ambulatory Visit: Payer: Self-pay | Admitting: Family Medicine

## 2023-11-07 ENCOUNTER — Other Ambulatory Visit: Payer: Self-pay

## 2023-11-07 ENCOUNTER — Encounter: Payer: Self-pay | Admitting: Thoracic Surgery (Cardiothoracic Vascular Surgery)

## 2023-11-07 ENCOUNTER — Ambulatory Visit (INDEPENDENT_AMBULATORY_CARE_PROVIDER_SITE_OTHER): Payer: Self-pay | Admitting: Thoracic Surgery (Cardiothoracic Vascular Surgery)

## 2023-11-07 ENCOUNTER — Ambulatory Visit
Admission: RE | Admit: 2023-11-07 | Discharge: 2023-11-07 | Disposition: A | Discharge status: Home / Self Care | Source: Ambulatory Visit | Attending: Thoracic Surgery (Cardiothoracic Vascular Surgery) | Admitting: Thoracic Surgery (Cardiothoracic Vascular Surgery)

## 2023-11-07 VITALS — BP 118/85 | HR 75 | Resp 20 | Ht 66.0 in | Wt 194.0 lb

## 2023-11-07 DIAGNOSIS — Z952 Presence of prosthetic heart valve: Secondary | ICD-10-CM

## 2023-11-07 DIAGNOSIS — I10 Essential (primary) hypertension: Secondary | ICD-10-CM | POA: Diagnosis not present

## 2023-11-07 NOTE — Telephone Encounter (Signed)
 I would recommend discussing new pain during follow up with surgeon or with pain management since I have not managed this medication for her

## 2023-11-07 NOTE — Progress Notes (Signed)
 Referral placed for outpatient cardiac rehab at [facility] per Dr. Verdene Givens request.

## 2023-11-07 NOTE — Telephone Encounter (Unsigned)
 Copied from CRM [id number]. Topic: Clinical - Medication Refill >> Nov 07, 2023 11:28 AM Carlatta H wrote: Most Recent Primary Care Visit:  Provider: Mimi Alt  Department: [facility]  Visit Type: HOSPITAL FOLLOW UP  Date: 10/30/2023  Medication: oxyCODONE (OXY IR/ROXICODONE) 5 MG immediate release tablet [[id number]]  Has the patient contacted their pharmacy? Yes (Agent: If no, request that the patient contact the pharmacy for the refill. If patient does not wish to contact the pharmacy document the reason why and proceed with request.) (Agent: If yes, when and what did the pharmacy advise?) Previous pharmacy does not have medication in stock  Is this the correct pharmacy for this prescription? Yes If no, delete pharmacy and type the correct one.  This is the patient's preferred pharmacy:    [facility] [address] ([address]), [place] - [address] [place]) [place] [address] Phone: [phone] Fax: [phone]   Has the prescription been filled recently? No  Is the patient out of the medication? Yes  Has the patient been seen for an appointment in the last year OR does the patient have an upcoming appointment? Yes  Can we respond through MyChart? Yes  Agent: Please be advised that Rx refills may take up to 3 business days. We ask that you follow-up with your pharmacy.

## 2023-11-07 NOTE — Telephone Encounter (Signed)
 Called and was able to inform the pt of the message per Dr R, she stated the issue has been taking care of after she spoke to the surgeon during her visit today

## 2023-11-07 NOTE — Telephone Encounter (Signed)
 Please resend to [facility] 3612. Out of stock at initial pharmacy    Copied from CRM [id number]. Topic: Clinical - Prescription Issue >> Nov 07, 2023  2:05 PM Baldemar Lev wrote: Reason for CRM: : Pt called to report that the current pharmacy does not have oxycodone in stock.  She says she wants her Rx sent to:   [facility] [address] ([address]), [place] - [address]) [place] [address] Phone: [phone] Fax: [phone]

## 2023-11-07 NOTE — Patient Instructions (Signed)
 Follow up prn

## 2023-11-07 NOTE — Progress Notes (Signed)
 [address]       [place] [address]             [phone]           PERRIN EDDLEMAN [facility] Medical Record #[id number] Date of Birth: May 28, 1971  Antonieta Iba, MD Ronnald Ramp, MD  Chief Complaint:   SP mvr and maze  History of Present Illness:     Pt is now a month out from her above surgery. She has required readmission for afib with RVR and cardioversion. Also INR low and was on lovenox. INR now 2 and her coumadin being adjusted. She had an echo with reduced LV function from preop and is having heart failure meds adjusted. She overall is feeling better with more energy and no longer lower ext edema. Her cxr today completely clear. Occasional chest wall discomfort mostly on right side and also across upper chest      Past Medical History:  Diagnosis Date   Hypertension     Past Surgical History:  Procedure Laterality Date   CARDIOVERSION N/A 11/01/2023   Procedure: CARDIOVERSION;  Surgeon: Dolores Patty, MD;  Location: [facility] ORS;  Service: Cardiovascular;  Laterality: N/A;   CLIPPING OF ATRIAL APPENDAGE  10/07/2023   Procedure: CLIPPING, LEFT ATRIAL APPENDAGE USING MEDTRONIC PENDITURE LAA EXCLUSION SYSTEM SIZE ;  Surgeon: Eugenio Hoes, MD;  Location: [facility] OR;  Service: Open Heart Surgery;;   INTRAOPERATIVE TRANSESOPHAGEAL ECHOCARDIOGRAM N/A 10/07/2023   Procedure: ECHOCARDIOGRAM, TRANSESOPHAGEAL, INTRAOPERATIVE;  Surgeon: Eugenio Hoes, MD;  Location: [facility] OR;  Service: Open Heart Surgery;  Laterality: N/A;   MAZE N/A 10/07/2023   Procedure: MAZE PROCEDURE;  Surgeon: Eugenio Hoes, MD;  Location: [facility] OR;  Service: Open Heart Surgery;  Laterality: N/A;   MITRAL VALVE REPLACEMENT N/A 10/07/2023   Procedure: MITRAL VALVE REPLACEMENT USING SJM MASTERS SERIES MECHANICAL MITRAL HEART VALVE SIZE ;  Surgeon: Eugenio Hoes, MD;  Location: [facility] OR;  Service: Open Heart Surgery;  Laterality: N/A;   RIGHT/LEFT HEART CATH AND CORONARY  ANGIOGRAPHY N/A 10/03/2023   Procedure: RIGHT/LEFT HEART CATH AND CORONARY ANGIOGRAPHY;  Surgeon: Yvonne Kendall, MD;  Location: [facility] INVASIVE CV LAB;  Service: Cardiovascular;  Laterality: N/A;   TEE WITHOUT CARDIOVERSION N/A 10/02/2023   Procedure: ECHOCARDIOGRAM, TRANSESOPHAGEAL;  Surgeon: Antonieta Iba, MD;  Location: [facility] ORS;  Service: Cardiovascular;  Laterality: N/A;    Social History   Tobacco Use  Smoking Status Every Day   Current packs/day: 1.00   Types: Cigarettes  Smokeless Tobacco Never    Social History   Substance and Sexual Activity  Alcohol Use No    Social History   Socioeconomic History   Marital status: Single    Spouse name: Not on file   Number of children: 8   Years of education: Not on file   Highest education level: Some college, no degree  Occupational History   Not on file  Tobacco Use   Smoking status: Every Day    Current packs/day: 1.00    Types: Cigarettes   Smokeless tobacco: Never  Vaping Use   Vaping status: Never Used  Substance and Sexual Activity   Alcohol use: No   Drug use: No   Sexual activity: Yes    Birth control/protection: None, Condom  Other Topics Concern   Not on file  Social History Narrative   Not on file   Social Drivers of Health   Financial Resource Strain: Medium Risk (09/30/2023)  Overall Financial Resource Strain (CARDIA)    Difficulty of Paying Living Expenses: Somewhat hard  Food Insecurity: No Food Insecurity (10/20/2023)   Hunger Vital Sign    Worried About Running Out of Food in the Last Year: Never true    Ran Out of Food in the Last Year: Never true  Transportation Needs: No Transportation Needs (10/20/2023)   PRAPARE - Administrator, Civil Service (Medical): No    Lack of Transportation (Non-Medical): No  Physical Activity: Not on file  Stress: Not on file  Social Connections: Moderately Integrated (10/20/2023)   Social Connection and Isolation Panel [NHANES]    Frequency of  Communication with Friends and Family: Three times a week    Frequency of Social Gatherings with Friends and Family: Once a week    Attends Religious Services: 1 to 4 times per year    Active Member of [employer] or Organizations: Yes    Attends Banker Meetings: 1 to 4 times per year    Marital Status: Never married  Intimate Partner Violence: Not At Risk (10/20/2023)   Humiliation, Afraid, Rape, and Kick questionnaire    Fear of Current or Ex-Partner: No    Emotionally Abused: No    Physically Abused: No    Sexually Abused: No    Allergies  Allergen Reactions   Penicillins Hives         Current Outpatient Medications  Medication Sig Dispense Refill   acetaminophen (TYLENOL) 325 MG tablet Take 2 tablets (650 mg total) by mouth every 4 (four) hours as needed for headache or mild pain (pain score 1-3).     ALPRAZolam (XANAX) 0.25 MG tablet Take 1 tablet (0.25 mg total) by mouth at bedtime as needed for anxiety. 30 tablet 0   amiodarone (PACERONE) 200 MG tablet Take 1 tablet (200 mg total) by mouth 2 (two) times daily. 50 tablet 1   aspirin 81 MG chewable tablet Chew 1 tablet (81 mg total) by mouth daily.     busPIRone (BUSPAR) 7.5 MG tablet Take 1 tablet (7.5 mg total) by mouth 2 (two) times daily. 120 tablet 1   carvedilol (COREG) 3.125 MG tablet Take 1 tablet (3.125 mg total) by mouth 2 (two) times daily with a meal. 60 tablet 11   DULoxetine (CYMBALTA) 20 MG capsule Take 2 capsules (40 mg total) by mouth daily. 180 capsule 1   empagliflozin (JARDIANCE) 10 MG TABS tablet Take 1 tablet (10 mg total) by mouth daily. (Patient not taking: Reported on 10/30/2023) 30 tablet 11   enoxaparin (LOVENOX) 100 MG/ML injection Inject 1 mL (100 mg total) into the skin every 12 (twelve) hours. 14 mL 3   enoxaparin (LOVENOX) 100 MG/ML injection Inject 1 mL (100 mg total) into the skin 2 (two) times daily. 14 mL 0   Fe Fum-Vit C-Vit B12-FA (TRIGELS-F FORTE) CAPS capsule Take 1 capsule by mouth  daily. 30 capsule 5   ondansetron (ZOFRAN) 4 MG tablet Take 1 tablet (4 mg total) by mouth every 8 (eight) hours as needed for nausea or vomiting. 30 tablet 0   oxyCODONE (OXY IR/ROXICODONE) 5 MG immediate release tablet Take 5 mg by mouth 3 (three) times daily as needed for severe pain (pain score 7-10).     polyethylene glycol powder (GLYCOLAX/MIRALAX) 17 GM/SCOOP powder Take 17 g by mouth daily. Mix as directed. (Patient taking differently: Take 17 g by mouth daily as needed for moderate constipation.) 238 g 0   rosuvastatin (CRESTOR) 40  MG tablet Take 1 tablet (40 mg total) by mouth daily. 90 tablet 3   sacubitril-valsartan (ENTRESTO) 24-26 MG Take 1 tablet by mouth 2 (two) times daily. 60 tablet 11   spironolactone (ALDACTONE) 25 MG tablet Take 0.5 tablets (12.5 mg total) by mouth daily. 15 tablet 11   torsemide (DEMADEX) 20 MG tablet Take 1 tablet (20 mg total) by mouth daily. 30 tablet 11   Vitamin D, Ergocalciferol, (DRISDOL) 1.25 MG (50000 UNIT) CAPS capsule Take 1 capsule (50,000 Units total) by mouth every 7 (seven) days. 12 capsule 0   warfarin (COUMADIN) 2.5 MG tablet Take 1 tablet (2.5 mg total) by mouth as directed. Alternating schedule every 3 days, take 2.5 mg daily for 2 days, then 5 mg daily- then repeat 3 day cycle. Then per coumadin clinic (Patient taking differently: Take 2.5 mg by mouth daily.) 100 tablet 1   No current facility-administered medications for this visit.     No family history on file.     Physical Exam: Lungs: clear Card: RR with sharp valve click Incision well healed No edema in ankles     Diagnostic Studies & Laboratory data: I have personally reviewed the following studies and agree with the findings   TTE (10/21/2023) IMPRESSIONS     1. Left ventricular ejection fraction, by estimation, is 25 to 30%. Left  ventricular ejection fraction by 3D volume is 18 %. The left ventricle has  severely decreased function. The left ventricle  demonstrates global  hypokinesis. The left ventricular  internal cavity size was mildly dilated. There is moderate left  ventricular hypertrophy. Left ventricular diastolic function could not be  evaluated. The average left ventricular global longitudinal strain is -5.0  %. The global longitudinal strain is  abnormal.   2. Right ventricular systolic function is severely reduced. The right  ventricular size is mildly enlarged. Tricuspid regurgitation signal is  inadequate for assessing PA pressure.   3. Left atrial size was severely dilated.   4. The mitral valve has been repaired/replaced. Trivial mitral valve  regurgitation. The mean mitral valve gradient is 11.0 mmHg.   5. The aortic valve has an indeterminant number of cusps. Aortic valve  regurgitation is mild to moderate.   6. The inferior vena cava is normal in size with greater than 50%  respiratory variability, suggesting right atrial pressure of 3 mmHg.   FINDINGS   Left Ventricle: Left ventricular ejection fraction, by estimation, is 25  to 30%. Left ventricular ejection fraction by 3D volume is 18 %. The left  ventricle has severely decreased function. The left ventricle demonstrates  global hypokinesis. The average   left ventricular global longitudinal strain is -5.0 %. Strain was  performed and the global longitudinal strain is abnormal. The left  ventricular internal cavity size was mildly dilated. There is moderate  left ventricular hypertrophy. Abnormal  (paradoxical) septal motion, consistent with left bundle branch block.  Left ventricular diastolic function could not be evaluated due to mitral  valve replacement. Left ventricular diastolic function could not be  evaluated.   Right Ventricle: The right ventricular size is mildly enlarged. No  increase in right ventricular wall thickness. Right ventricular systolic  function is severely reduced. Tricuspid regurgitation signal is inadequate  for assessing PA  pressure.   Left Atrium: Left atrial size was severely dilated.   Right Atrium: Right atrial size was normal in size.   Pericardium: Trivial pericardial effusion is present.   Mitral Valve: The mitral valve has been  repaired/replaced. Trivial mitral  valve regurgitation. There is a 29 mm [employer]. 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 MV peak gradient, 19.4 mmHg. The mean mitral valve  gradient is 11.0 mmHg.   Tricuspid Valve: The tricuspid valve is not well visualized. Tricuspid  valve regurgitation is trivial.   Aortic Valve: The aortic valve has an indeterminant number of cusps.  Aortic valve regurgitation is mild to moderate. Aortic valve mean gradient  measures 5.0 mmHg. Aortic valve peak gradient measures 8.8 mmHg. Aortic  valve area, by VTI measures 2.70 cm.   Pulmonic Valve: The pulmonic valve was normal in structure. Pulmonic valve  regurgitation is mild. No evidence of pulmonic stenosis.   Aorta: The aortic root is normal in size and structure.   Pulmonary Artery: The pulmonary artery is of normal size.   Venous: The inferior vena cava is normal in size with greater than 50%  respiratory variability, suggesting right atrial pressure of 3 mmHg.   IAS/Shunts: The interatrial septum was not well visualized.   Additional Comments: 3D was performed not requiring image post processing  on an independent workstation and was abnormal. There is a small pleural  effusion in the left lateral region.     LEFT VENTRICLE  PLAX 2D  LVIDd:         5.30 cm         Diastology  LVIDs:         4.10 cm         LV e' medial:   8.05 cm/s  LV PW:         1.60 cm         LV E/e' medial: 23.5  LV IVS:        1.60 cm  LVOT diam:     2.00 cm         2D Longitudinal  LV SV:         48              Strain  LV SV Index:   23              2D Strain GLS   -5.0 %  LVOT Area:     3.14 cm        Avg:                                   3D Volume EF  LV Volumes (MOD)                LV 3D EF:    Left  LV vol d, MOD    136.0 ml                   ventricul  A2C:                                        ar  LV vol d, MOD    141.0 ml                   ejection  A4C:                                        fraction  LV vol  s, MOD    72.2 ml                    by 3D  A2C:                                        volume is  LV vol s, MOD    111.0 ml                   18 %.  A4C:  LV SV MOD A2C:   63.8 ml  LV SV MOD A4C:   141.0 ml      3D Volume EF:  LV SV MOD BP:    47.3 ml       3D EF:        18 %   RIGHT VENTRICLE  RV Basal diam:  5.00 cm  RV Mid diam:    2.50 cm  RV S prime:     8.59 cm/s  TAPSE (M-mode): 1.3 cm   LEFT ATRIUM              Index        RIGHT ATRIUM           Index  LA diam:        3.80 cm  1.86 cm/m   RA Area:     18.80 cm  LA Vol (A2C):   96.7 ml  47.34 ml/m  RA Volume:   52.00 ml  25.46 ml/m  LA Vol (A4C):   117.0 ml 57.28 ml/m  LA Biplane Vol: 109.0 ml 53.36 ml/m   AORTIC VALVE                     PULMONIC VALVE  AV Area (Vmax):    2.38 cm      PR End Diast Vel: 12.39 msec  AV Area (Vmean):   2.36 cm  AV Area (VTI):     2.70 cm  AV Vmax:           148.00 cm/s  AV Vmean:          102.000 cm/s  AV VTI:            0.177 m  AV Peak Grad:      8.8 mmHg  AV Mean Grad:      5.0 mmHg  LVOT Vmax:         112.00 cm/s  LVOT Vmean:        76.500 cm/s  LVOT VTI:          0.152 m  LVOT/AV VTI ratio: 0.86    AORTA  Ao Root diam: 3.40 cm   MITRAL VALVE  MV Area (PHT): 3.68 cm     SHUNTS  MV Area VTI:   1.48 cm     Systemic VTI:  0.15 m  MV Peak grad:  19.4 mmHg    Systemic Diam: 2.00 cm  MV Mean grad:  11.0 mmHg  MV Vmax:       2.20 m/s  MV Vmean:      155.0 cm/s  MV Decel Time: 206 msec  MV E velocity: 189.00 cm/s    Recent Radiology Findings:     CXR today completely clear  Recent Lab Findings: Lab Results  Component Value Date   WBC 10.0 10/29/2023   HGB 13.3 10/29/2023  HCT 43.1 10/29/2023   PLT 345 10/29/2023   GLUCOSE  169 (H) 10/29/2023   CHOL 175 09/26/2023   TRIG 79 09/26/2023   HDL 41 09/26/2023   LDLCALC 118 (H) 09/26/2023   ALT 45 (H) 10/29/2023   AST 31 10/29/2023   NA 134 (L) 10/29/2023   K 4.1 10/29/2023   CL 99 10/29/2023   CREATININE 1.07 (H) 10/29/2023   BUN 18 10/29/2023   CO2 26 10/29/2023   TSH 1.091 09/25/2023   INR 2.0 11/06/2023   HGBA1C 5.8 (H) 07/19/2023      Assessment / Plan:     SP MVR MAZE. Overall improving with medical optimization and in NSR. Discussed restrictions and may drive now. 2 more weeks of no lifiting of over 10lbs. She will go to cardiac rehab. Follow up prn. May return to work soonest 6 weeks.   I have spent 30 min in review of the records, viewing studies and in face to face with patient and in coordination of future care    Melene Sportsman 11/07/2023 8:17 AM

## 2023-11-11 ENCOUNTER — Telehealth: Payer: Self-pay

## 2023-11-11 NOTE — Progress Notes (Signed)
 Complex Care Management Note  Care Guide Note 11/11/2023 Name: MADDOX BRATCHER MRN: [id number] DOB: 1970-10-14  Alyce Baba is a 53 y.o. year old female who sees Simmons-Robinson, Judyann Number, MD for primary care. I reached out to Lyllie B Fore by phone today to offer complex care management services.  Ms. Bonfiglio was given information about Complex Care Management services today including:   The Complex Care Management services include support from the care team which includes your Nurse Care Manager, Clinical Social Worker, or Pharmacist.  The Complex Care Management team is here to help remove barriers to the health concerns and goals most important to you. Complex Care Management services are voluntary, and the patient may decline or stop services at any time by request to their care team member.   Complex Care Management Consent Status: Patient agreed to services and verbal consent obtained.   Follow up plan:  Telephone appointment with complex care management team member scheduled for:  LCSW 11/28/2023  Encounter Outcome:  Patient Scheduled  Lenton Rail , RMA     [place]  [facility], [facility] Guide  Direct Dial: [phone]  Website: [email or web address]

## 2023-11-11 NOTE — Progress Notes (Signed)
 Care Guide Pharmacy Note  11/11/2023 Name: Sandra Hines MRN: [id number] DOB: 1971-02-18  Referred By: Mimi Alt, MD Reason for referral: Complex Care Management (Outreach to schedule with LCSW and Pharm d )   Sandra Hines is a 53 y.o. year old female who is a primary care patient of Simmons-Robinson, Judyann Number, MD.  Sandra Hines was referred to the pharmacist for assistance related to: CHF  Successful contact was made with the patient to discuss pharmacy services including being ready for the pharmacist to call at least 5 minutes before the scheduled appointment time and to have medication bottles and any blood pressure readings ready for review. The patient agreed to meet with the pharmacist via telephone visit on (date/time).11/21/2023  Lenton Rail , RMA     [place]  [facility], [facility] Guide  Direct Dial: [phone]  Website: [email or web address]

## 2023-11-13 ENCOUNTER — Ambulatory Visit: Attending: Cardiovascular Disease

## 2023-11-13 ENCOUNTER — Other Ambulatory Visit: Payer: Self-pay

## 2023-11-13 DIAGNOSIS — Z952 Presence of prosthetic heart valve: Secondary | ICD-10-CM

## 2023-11-13 DIAGNOSIS — I4891 Unspecified atrial fibrillation: Secondary | ICD-10-CM

## 2023-11-13 DIAGNOSIS — Z5181 Encounter for therapeutic drug level monitoring: Secondary | ICD-10-CM

## 2023-11-13 LAB — POCT INR: INR: 1.6 — AB (ref 2.0–3.0)

## 2023-11-13 NOTE — Patient Instructions (Signed)
 Take 3 tablets today only then Increase to 2 tablets Daily.   INR in 1 week  [phone]

## 2023-11-15 ENCOUNTER — Other Ambulatory Visit: Payer: Self-pay | Admitting: Family Medicine

## 2023-11-15 ENCOUNTER — Other Ambulatory Visit: Payer: Self-pay

## 2023-11-15 NOTE — Telephone Encounter (Signed)
 Requested medications are due for refill today.  yes  Requested medications are on the active medications list.  yes  Last refill. 10/22/2023  Future visit scheduled.   no  Notes to clinic.  Refill not delegated. Rx signed by Althia Atlas.    Requested Prescriptions  Pending Prescriptions Disp Refills   oxyCODONE  (OXY IR/ROXICODONE ) 5 MG immediate release tablet 15 tablet 0    Sig: Take 1 tablet (5 mg total) by mouth 3 (three) times daily as needed for up to 5 days for severe pain (pain score 7-10).     Not Delegated - Analgesics:  Opioid Agonists Failed - 11/15/2023  4:41 PM      Failed - This refill cannot be delegated      Passed - Urine Drug Screen completed in last 360 days      Passed - Valid encounter within last 3 months    Recent Outpatient Visits           2 weeks ago Primary hypertension   [place] [facility] Simmons-Robinson, [place], MD   1 month ago Paroxysmal atrial fibrillation [facility])   [facility] Simmons-Robinson, [place], MD   2 months ago Primary hypertension   [facility] Simmons-Robinson, Judyann Number, MD       Future Appointments             In 3 weeks Ronald Cockayne, NP [place] [facility]

## 2023-11-18 ENCOUNTER — Ambulatory Visit: Attending: Cardiovascular Disease

## 2023-11-18 ENCOUNTER — Other Ambulatory Visit: Payer: Self-pay | Admitting: Family Medicine

## 2023-11-18 ENCOUNTER — Other Ambulatory Visit: Payer: Self-pay

## 2023-11-19 ENCOUNTER — Other Ambulatory Visit: Payer: Self-pay

## 2023-11-20 ENCOUNTER — Ambulatory Visit: Attending: Cardiovascular Disease

## 2023-11-20 DIAGNOSIS — I4891 Unspecified atrial fibrillation: Secondary | ICD-10-CM | POA: Diagnosis not present

## 2023-11-20 DIAGNOSIS — Z5181 Encounter for therapeutic drug level monitoring: Secondary | ICD-10-CM

## 2023-11-20 DIAGNOSIS — Z952 Presence of prosthetic heart valve: Secondary | ICD-10-CM

## 2023-11-20 LAB — POCT INR: INR: 3.4 — AB (ref 2.0–3.0)

## 2023-11-20 NOTE — Patient Instructions (Signed)
 Continue 2 tablets Daily.   INR in 2 weeks  [phone]

## 2023-11-20 NOTE — Telephone Encounter (Signed)
 Requested medication (s) are due for refill today: yes  Requested medication (s) are on the active medication list: hx med  Last refill:  10/29/23  Future visit scheduled: no  Notes to clinic:  hx provider   Requested Prescriptions  Pending Prescriptions Disp Refills   oxyCODONE  (OXY IR/ROXICODONE ) 5 MG immediate release tablet 15 tablet 0    Sig: Take 1 tablet (5 mg total) by mouth 3 (three) times daily as needed for up to 5 days for severe pain (pain score 7-10).     Not Delegated - Analgesics:  Opioid Agonists Failed - 11/20/2023  3:35 PM      Failed - This refill cannot be delegated      Passed - Urine Drug Screen completed in last 360 days      Passed - Valid encounter within last 3 months    Recent Outpatient Visits           3 weeks ago Primary hypertension   [place] Simmons-Robinson, [place], MD   1 month ago Paroxysmal atrial fibrillation [facility])   [place] [facility] Simmons-Robinson, [place], MD   2 months ago Primary hypertension   [place] [facility] Simmons-Robinson, Judyann Number, MD       Future Appointments             In 3 weeks Ronald Cockayne, NP [place] [facility]

## 2023-11-21 ENCOUNTER — Other Ambulatory Visit: Payer: Self-pay | Admitting: Family Medicine

## 2023-11-21 ENCOUNTER — Other Ambulatory Visit: Payer: Self-pay | Admitting: Pharmacist

## 2023-11-21 ENCOUNTER — Other Ambulatory Visit (HOSPITAL_COMMUNITY): Payer: Self-pay

## 2023-11-21 ENCOUNTER — Other Ambulatory Visit: Payer: Self-pay

## 2023-11-21 DIAGNOSIS — F5102 Adjustment insomnia: Secondary | ICD-10-CM

## 2023-11-21 DIAGNOSIS — F39 Unspecified mood [affective] disorder: Secondary | ICD-10-CM

## 2023-11-21 DIAGNOSIS — Z952 Presence of prosthetic heart valve: Secondary | ICD-10-CM

## 2023-11-21 MED ORDER — DULOXETINE HCL 20 MG PO CPEP
40.0000 mg | ORAL_CAPSULE | Freq: Every day | ORAL | 1 refills | Status: AC
Start: 2023-09-06 — End: ?
  Filled 2023-12-09 – 2023-12-19 (×2): qty 60, 30d supply, fill #0
  Filled 2024-01-03 – 2024-01-20 (×2): qty 60, 30d supply, fill #1
  Filled 2024-02-21: qty 60, 30d supply, fill #2
  Filled 2024-03-25: qty 60, 30d supply, fill #0
  Filled 2024-03-25: qty 60, 30d supply, fill #3
  Filled 2024-04-06: qty 60, 30d supply, fill #1

## 2023-11-21 MED ORDER — ALPRAZOLAM 0.25 MG PO TABS
0.2500 mg | ORAL_TABLET | Freq: Every evening | ORAL | 0 refills | Status: DC | PRN
  Filled 2023-11-21 – 2023-12-09 (×3): qty 30, 30d supply, fill #0

## 2023-11-21 MED ORDER — BUSPIRONE HCL 7.5 MG PO TABS
7.5000 mg | ORAL_TABLET | Freq: Two times a day (BID) | ORAL | 1 refills | Status: DC
  Filled 2023-12-09: qty 60, 30d supply, fill #0
  Filled 2024-01-03: qty 60, 30d supply, fill #1
  Filled 2024-02-09: qty 60, 30d supply, fill #2

## 2023-11-21 MED ORDER — WARFARIN SODIUM 2.5 MG PO TABS
5.0000 mg | ORAL_TABLET | Freq: Every day | ORAL | 2 refills | Status: DC
  Filled 2023-11-21: qty 60, 30d supply, fill #0
  Filled 2023-12-17: qty 60, 30d supply, fill #1
  Filled 2024-01-03 – 2024-01-10 (×2): qty 60, 30d supply, fill #2

## 2023-11-21 NOTE — Progress Notes (Signed)
   11/21/2023  Patient ID: Sandra Hines, female   DOB: 1971/05/23, 53 y.o.   MRN: [id number]  Called and spoke with the patient on the phone today. Completed medication review. Everything on the list has been updated. Advised patient to discontinue Amlodipine - no longer on med list. Confirmed understanding.  Currently due on 11/27/23: Duloxetine , Alprazolam  (PCP sending refills), Buspirone , amiodarone , tramadol  (if PCP sends), warfarin (twice a day)   **All others due around 12/13/23   Transfers: [facility]: [name], [name] (phone #: [phone])   [name] (phone #: [phone])   Called and spoke with the pharmacy- sent note above to pharmacist to get transfers and start mail order process. Said warfarin was set-up at no-charge already.    Delvin File, PharmD [facility]  Phone Number: [phone]

## 2023-11-21 NOTE — Addendum Note (Signed)
 Addended by: SIMMONS-ROBINSON, Laurrie Toppin L on: 11/21/2023 11:53 AM   Modules accepted: Orders

## 2023-11-24 NOTE — Telephone Encounter (Signed)
 Increased dosing, Rx 10/30/23 #120 1RF Requested Prescriptions  Pending Prescriptions Disp Refills   busPIRone  (BUSPAR ) 7.5 MG tablet [Pharmacy Med Name: BUSPIRONE  HCL 7.5 MG TABLET] 180 tablet 2    Sig: TAKE 1 TABLET BY MOUTH 2 TIMES DAILY.     Psychiatry: Anxiolytics/Hypnotics - Non-controlled Passed - 11/24/2023 12:39 PM      Passed - Valid encounter within last 12 months    Recent Outpatient Visits           3 weeks ago Primary hypertension   [place] Simmons-Robinson, [place], MD   2 months ago Paroxysmal atrial fibrillation [facility])   [place] [facility] Simmons-Robinson, Judyann Number, MD   2 months ago Primary hypertension   [place] [facility] Simmons-Robinson, Judyann Number, MD       Future Appointments             In 2 weeks Ronald Cockayne, NP [place] [facility]

## 2023-11-26 ENCOUNTER — Other Ambulatory Visit (HOSPITAL_COMMUNITY): Payer: Self-pay

## 2023-11-26 ENCOUNTER — Other Ambulatory Visit: Payer: Self-pay

## 2023-11-27 ENCOUNTER — Telehealth: Payer: Self-pay | Admitting: Internal Medicine

## 2023-11-27 NOTE — Telephone Encounter (Signed)
 Called to confirm/remind patient of their appointment [facility] on 11/28/23.   Appointment:   [] Confirmed  [x] Left mess   [] No answer/No voice mail  [] VM Full/unable to leave message  [] Phone not in service  Patient reminded to bring all medications and/or complete list.  Confirmed patient has transportation. Gave directions, instructed to utilize valet parking.

## 2023-11-28 ENCOUNTER — Telehealth: Payer: Self-pay

## 2023-11-28 ENCOUNTER — Encounter: Admitting: Internal Medicine

## 2023-11-28 ENCOUNTER — Other Ambulatory Visit: Payer: Self-pay | Admitting: *Deleted

## 2023-11-28 NOTE — Telephone Encounter (Signed)
 Requested forms were faxed on 11/13/23

## 2023-11-28 NOTE — Telephone Encounter (Signed)
 Copied from CRM [id number]. Topic: General - Other >> Nov 28, 2023 11:42 AM Loreda Rodriguez T wrote: Reason for CRM: patient called to f/u on her [facility] care forms. Please f/u with patient.

## 2023-11-29 NOTE — Telephone Encounter (Signed)
 Patient advised via her voicemail

## 2023-11-29 NOTE — Patient Outreach (Signed)
 Complex Care Management   Visit Note  11/29/2023 late entry  Name:  Sandra Hines MRN: [id number] DOB: 11/29/1970  Situation: Referral received for Complex Care Management related to [facility]/Behavioral Health diagnosis Anxiety I obtained verbal consent from Patient.  Visit completed with patient on 11/28/23  on the phone  Background:   Past Medical History:  Diagnosis Date   Hypertension     Assessment: Patient Reported Symptoms:  Cognitive Cognitive Status: Alert and oriented to person, place, and time, Insightful and able to interpret abstract concepts, Normal speech and language skills      Neurological Neurological Review of Symptoms: No symptoms reported    HEENT HEENT Symptoms Reported: No symptoms reported      Cardiovascular Cardiovascular Symptoms Reported: No symptoms reported Does patient have uncontrolled Hypertension?: No Cardiovascular Conditions: Heart failure, Hypertension Cardiovascular Management Strategies: Adequate rest, Medical device, Medication therapy Weight: 194 lb (88 kg) Cardiovascular Comment: mechanical valve placement 3/25  Respiratory Respiratory Symptoms Reported: No symptoms reported    Endocrine Patient reports the following symptoms related to hypoglycemia or hyperglycemia : No symptoms reported Is patient diabetic?: No    Gastrointestinal Gastrointestinal Symptoms Reported: No symptoms reported      Genitourinary Genitourinary Symptoms Reported: No symptoms reported    Integumentary Integumentary Symptoms Reported: No symptoms reported    Musculoskeletal Musculoskelatal Symptoms Reviewed: Unsteady gait Additional Musculoskeletal Details: per patient this is due to recent surgery-still in recovery period   Falls in the past year?: Yes Number of falls in past year: 2 or more Was there an injury with Fall?: No Fall Risk Category Calculator: 2 Patient Fall Risk Level: Moderate Fall Risk    Psychosocial Psychosocial Symptoms Reported:  Depression - if selected complete PHQ 2-9, Sadness - if selected complete PHQ 2-9, Anxiety - if selected complete GAD Behavioral Health Conditions: Anxiety, Depression Behavioral Management Strategies: Adequate rest, Coping strategies Major Change/Loss/Stressor/Fears (CP): Medical condition, self Behaviors When Feeling Stressed/Fearful: withdraws at time, approaches lifes struggles with gratitude Techniques to Cope with Loss/Stress/Change: Diversional activities, Medication Quality of Family Relationships: supportive Do you feel physically threatened by others?: No      11/28/2023    9:58 AM  Depression screen PHQ 2/9  Decreased Interest 1  Down, Depressed, Hopeless 3  PHQ - 2 Score 4  Altered sleeping 3  Tired, decreased energy 3  Change in appetite 3  Feeling bad or failure about yourself  2  Trouble concentrating 2  Moving slowly or fidgety/restless 0  Suicidal thoughts 3  PHQ-9 Score 20  Difficult doing work/chores Extremely dIfficult    There were no vitals filed for this visit.  Medications Reviewed Today     Reviewed by Ave Leisure, LCSW (Social Worker) on 11/28/23 at [phone]  Med List Status: <None>   Medication Order Taking? Sig Documenting Provider Last Dose Status Informant  acetaminophen  (TYLENOL ) 325 MG tablet [id number] Yes Take 2 tablets (650 mg total) by mouth every 4 (four) hours as needed for headache or mild pain (pain score 1-3). Ezzard Holms, MD Taking Active Self           Med Note Shann Darnel, [place] A   Tue Oct 29, 2023 12:17 PM)    ALPRAZolam  (XANAX ) 0.25 MG tablet [id number] Yes Take 1 tablet (0.25 mg total) by mouth at bedtime as needed for anxiety. Simmons-Robinson, Judyann Number, MD Taking Active   amiodarone  (PACERONE ) 200 MG tablet [id number] Yes Take 1 tablet (200 mg total) by mouth 2 (two) times daily. Bensimhon,  Rheta Celestine, MD Taking Active Self  aspirin  81 MG chewable tablet [id number] Yes Chew 1 tablet (81 mg total) by mouth daily. Gold, Wayne E, PA-C Taking  Active Self  busPIRone  (BUSPAR ) 7.5 MG tablet [id number] Yes Take 1 tablet (7.5 mg total) by mouth 2 (two) times daily. Simmons-Robinson, Makiera, MD Taking Active   busPIRone  (BUSPAR ) 7.5 MG tablet [id number] Yes Take 1 tablet (7.5 mg total) by mouth 2 (two) times daily. Simmons-Robinson, Judyann Number, MD Taking Active   carvedilol  (COREG ) 3.125 MG tablet [id number] Yes Take 1 tablet (3.125 mg total) by mouth 2 (two) times daily with a meal. Althia Atlas, MD Taking Active Self  DULoxetine  (CYMBALTA ) 20 MG capsule [id number] Yes Take 2 capsules (40 mg total) by mouth daily. Simmons-Robinson, Makiera, MD Taking Active Self  DULoxetine  (CYMBALTA ) 20 MG capsule [id number] Yes Take 2 capsules (40 mg total) by mouth daily. Simmons-Robinson, Makiera, MD Taking Active   empagliflozin  (JARDIANCE ) 10 MG TABS tablet [id number] Yes Take 1 tablet (10 mg total) by mouth daily. Althia Atlas, MD Taking Active Self           Med Note Manfred Seed, RAVEN M   Thu Nov 21, 2023  9:08 AM)    Fe Fum-Vit C-Vit B12-FA (TRIGELS-F FORTE) CAPS capsule [id number] No Take 1 capsule by mouth daily.  Patient not taking: Reported on 11/21/2023   Althia Atlas, MD Not Taking Active Self  ondansetron  (ZOFRAN ) 4 MG tablet [id number] No Take 1 tablet (4 mg total) by mouth every 8 (eight) hours as needed for nausea or vomiting.  Patient not taking: Reported on 11/28/2023   Mimi Alt, MD Not Taking Active   oxyCODONE  (OXY IR/ROXICODONE ) 5 MG immediate release tablet [id number] No Take 5 mg by mouth 3 (three) times daily as needed for severe pain (pain score 7-10).  Patient not taking: Reported on 11/21/2023   [provider] Not Taking Active Self  polyethylene glycol powder (GLYCOLAX /MIRALAX ) 17 GM/SCOOP powder [id number] No Take 17 g by mouth daily. Mix as directed.  Patient not taking: Reported on 11/28/2023   Althia Atlas, MD Not Taking Active Self  rosuvastatin  (CRESTOR ) 40 MG tablet [id number] Yes Take 1 tablet (40 mg total) by  mouth daily. Normie Becton, FNP Taking Active Self  sacubitril -valsartan  (ENTRESTO ) 24-26 MG [id number] Yes Take 1 tablet by mouth 2 (two) times daily. Althia Atlas, MD Taking Active Self  spironolactone  (ALDACTONE ) 25 MG tablet [id number] Yes Take 0.5 tablets (12.5 mg total) by mouth daily. Althia Atlas, MD Taking Active Self  torsemide  (DEMADEX ) 20 MG tablet [id number] Yes Take 1 tablet (20 mg total) by mouth daily. Althia Atlas, MD Taking Active Self  Vitamin D , Ergocalciferol , (DRISDOL ) 1.25 MG (50000 UNIT) CAPS capsule [id number] Yes Take 1 capsule (50,000 Units total) by mouth every 7 (seven) days. Althia Atlas, MD Taking Active Self           Med Note Shann Darnel, [place] A   Tue Oct 29, 2023 12:18 PM) Tuesday  warfarin (COUMADIN ) 2.5 MG tablet [id number] Yes Take 2 tablets (5 mg total) by mouth daily. Take as directed by coumadin  clinic. Gollan, Timothy J, MD Taking Active             Recommendation:   PCP Follow-up  Follow Up Plan:   Telephone follow-up 12/23/23  Michaelle Adolphus, LCSW [place]  [facility], [facility] Licensed Clinical Social Worker Care Coordinator  Direct Dial: [phone]

## 2023-11-29 NOTE — Patient Instructions (Signed)
 Visit Information  Thank you for taking time to visit with me today.   Tailored Plan Medicaid On January 21, 2023 some people on [place] Medicaid will move to a new kind of Medicaid health plan called a Tailored Plan. Tailored Plans cover your doctor visits, prescription drugs, and health care services.    If your [place] Medicaid will move to a Tailored Plan, you should have gotten a letter and welcome packet. If you're not sure, call your [place] Medicaid Enrollment Broker at [phone] and ask.  Check out these free materials, in [place] and [place], to learn more about your Tailored Plan: [email or web address]/Tailored-Plans/Toolkit  Tailored Care Management Services  TCM services are available to you now. If you are a Tailored Plan member or will be and want information about Tailored Care Management Services including rides to appointments and community and home services, call the Care Management provider for your county of residence:    [facility] [place], [place])  Member Services: [phone] Behavioral Health Crisis Line: [phone], [place], [place], [place], [place])  Member Services: [phone] Behavioral Health Crisis Line: [phone]     Please call the Suicide and Crisis Lifeline: 988 if you are experiencing a Mental Health or Behavioral Health Crisis or need someone to talk to.  Patient verbalizes understanding of instructions and care plan provided today and agrees to view in MyChart. Active MyChart status and patient understanding of how to access instructions and care plan via MyChart confirmed with patient.     Keatyn Luck, LCSW [place]  [facility], [facility] Licensed Clinical Social Worker Care Coordinator  Direct Dial: [phone]

## 2023-12-04 ENCOUNTER — Ambulatory Visit: Attending: Cardiovascular Disease

## 2023-12-04 DIAGNOSIS — I4891 Unspecified atrial fibrillation: Secondary | ICD-10-CM

## 2023-12-04 DIAGNOSIS — Z952 Presence of prosthetic heart valve: Secondary | ICD-10-CM | POA: Diagnosis not present

## 2023-12-04 DIAGNOSIS — Z5181 Encounter for therapeutic drug level monitoring: Secondary | ICD-10-CM

## 2023-12-04 LAB — POCT INR: INR: 3.2 — AB (ref 2.0–3.0)

## 2023-12-04 NOTE — Patient Instructions (Signed)
 Continue 2 tablets Daily.   INR in 3 weeks  [phone]

## 2023-12-09 ENCOUNTER — Other Ambulatory Visit (HOSPITAL_COMMUNITY): Payer: Self-pay

## 2023-12-09 ENCOUNTER — Other Ambulatory Visit: Payer: Self-pay

## 2023-12-09 MED ORDER — AMIODARONE HCL 200 MG PO TABS
200.0000 mg | ORAL_TABLET | Freq: Two times a day (BID) | ORAL | 0 refills | Status: DC
  Filled 2023-12-09: qty 50, 25d supply, fill #0

## 2023-12-11 ENCOUNTER — Ambulatory Visit: Attending: Cardiology | Admitting: Cardiology

## 2023-12-11 ENCOUNTER — Ambulatory Visit: Admitting: Family Medicine

## 2023-12-11 NOTE — Progress Notes (Deleted)
 Cardiology Office Note:  .   Date:  12/11/2023  ID:  Sandra Hines, DOB [birth year], MRN [id number] PCP: Mimi Alt, MD  [facility] Providers Cardiologist:  Belva Boyden, MD { Click to update primary MD,subspecialty MD or APP then REFRESH:1}   History of Present Illness: .   Sandra Hines is a 53 y.o. female with past medical history listed hypertension, hyperlipidemia, tobacco abuse, cerebral aneurysm (repaired at [facility] in 2019), paroxysmal atrial fibrillation status post cardioversion (11/01/2023), mitral valve replacement (prosthetic MVR), status post maze with occlusion of LLE (10/07/2023), HFrEF/NICM, mood disorder, arthritis, tobacco dependence, obesity, who is being seen today for follow-up   Sandra Hines who previously presented to [facility] emergency department 09/25/2023 with acute onset of palpitations, shortness of breath, generalized weakness and malaise, and cough with upper respiratory symptoms not improving.  She was found to be hypertensive emergency with a blood pressure of 211/154 in atrial fibrillation with RVR.  EKG revealed suspected atrial fibrillation with rate of 184 with repolarization abnormality.  Chest x-ray showed mild cardiomegaly with mild central pulmonary vascular congestion.  Cardiology was consulted.  She was placed on Cardizem  drip initially but due to lack of response was placed on amiodarone .  She was given bisoprolol  and amlodipine  for blood pressure control.  She was restarted on rosuvastatin  after lipid panel was completed.  She did require BiPAP briefly then high flow nasal cannula.  She was treated for possible bronchitis and early pneumonia.  She was diuresed with furosemide .  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  She was placed on a heparin  drip for atrial fibrillation.  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  She converted to sinus rhythm and was transition to oral amiodarone .  Bisoprolol  twice daily was continued.  Cardiac catheterization was done 10/03/2023 showed no significant atherosclerotic coronary artery disease.  She was transferred to [facility] for further evaluation and treatment on 10/03/2023.  Cardiothoracic surgery (Dr. Honey Lusty) was consulted for consideration of mitral valve repair/replacement.  Dr. Honey Lusty discussed the need for median sternotomy for mitral valve replacement using mechanical mitral valve, PVI MAZE, and consideration of replacement of ascending thoracic aorta.   She underwent median sternotomy for mechanical mitral valve replacement and PVI maze.  Aortic valve dilation was not severe enough to warrant repair.  Following procedure she was separated from cardiopulmonary bypass without difficulty and was transferred to the ICU in stable condition.  She was extubated early in the afternoon of surgery.  She did have underlying sinus with bigeminy after surgery.  She also had A-fib with RVR on admission and was not initially treated with amiodarone  or Lopressor  postoperatively because of sinus arrest/atrial bigeminy.  Advanced heart failure followed her postop and diuresed her with IV Lasix  and she was started on BiDil  half a tablet 3 times daily.  On 318 she was not stable to be advanced to full GDMT but was recommended to continue to escalate it on an outpatient basis.  She was started on low-dose warfarin with PT and INR monitored daily.  Unfortunately she was not started on Lovenox  postoperatively due  to thrombocytopenia.  She had to be started on oral amiodarone  200 mg twice daily  for the atrial fibrillation.  EP was asked to evaluate as she had paroxysms of atrial fibrillation and bradycardia when she converted.  Bradycardia events subsided over time and it was not felt that she would require pacemaker placement.  She had received a course of diuretics during the hospitalization but was thought that ongoing diuresis at discharge was not needed.  She was started on IV ceftriaxone  for possible bronchitis due to leukocytosis and cough.  She was felt stable for discharge out of the ICU.  She continued to make ongoing improvements in her physical recovery.  Incisions are noted to be healing well without evidence of infection.  Oxygen has been weaned and she was maintaining saturations on room air.  INR was therapeutic at 2.7.  So she was considered stable was able to be discharged from the facility on 10/14/2023.  She presented to [facility] emergency department from home on 10/19/2023 with midsternal chest pain and reported sharp/stabbing shortness of breath, increased swelling, and weight gain 7 pounds.  Her symptoms had worsened over the last 24 hours.  She was previously discharged from [facility] stating she was not placed on diuretic therapy even though she required diuresis during hospitalization.  She stated she had called the clinic and asked for Lasix  but unfortunately was advised without her medication profile.  Initial vital signs reveal blood pressure 143/83, pulse of 73, respirations of 15, temperature 97.8.  Pertinent labs revealed a sodium 133, blood glucose 120, calcium  8.7, hemoglobin 9.6, hematocrit 30.4, INR 2.2, high-sensitivity troponin 4 8, 490, 449, and a BNP of 1629.6.  Chest x-ray revealed persistent left basilar opacity, atelectasis versus pneumonia and probable small left pleural effusion, mild right perihilar interstitial opacity which would reflect edema versus infection since.  She was given furosemide  60 mg IV and oxycodone  of 5 mg while in the emergency  department.  Due to mitral valve replacement she was placed on heparin  infusion and Coumadin  was restarted for goal of 2.5-3.5.  High-sensitivity troponins were considered low suspicion for ACS given left heart catheterization 10/03/2023 showed no angiographically significant coronary artery disease.  Echocardiogram was repeated during the hospitalization which revealed significant drop with her LVEF of 25-30% and RV severely reduced from previous 45 to 50%.  She was diuresed adequately during her hospitalization and was discharged on torsemide  20 mg p.o. daily, BiDil  was discontinued and she was started on Entresto  24/26 mg twice daily, and Jardiance  10 mg daily.  Continue to escalate GDMT with repeat echocardiogram ordered.  She was considered stable and discharged from the facility on 10/22/2023.    She was last seen in clinic 10/25/2023 with continued shortness of breath and chest discomfort.  She was down 17 pounds to be diuresed in the hospital.  She has suggested follow-up with CVTS with there is no further medication changes were made.  Appointment with Coumadin  clinic was scheduled.  She was continued on GDMT on her current medication regimen without changes.  She was on de-escalation of amiodarone  but was maintaining sinus rhythm on EKG.  She was recently evaluated by Dr. Julane Ny in advanced heart failure 10/29/23 which she has found to be back in atrial fibrillation with RVR.  Amiodarone  was increased to 200 mg twice daily and an elective cardioversion procedure was scheduled.  She underwent elective cardioversion on 11/01/2023 with a single shock 150 J converted to sinus rhythm with no immediate postoperative  complications.  She returns to clinic today   ROS: 10 point review of systems has been reviewed and considered negative except ones been listed in the HPI  Studies Reviewed: .        2D echo 10/21/2023 1. Left ventricular ejection fraction, by estimation, is 25 to 30%. Left  ventricular  ejection fraction by 3D volume is 18 %. The left ventricle has  severely decreased function. The left ventricle demonstrates global  hypokinesis. The left ventricular  internal cavity size was mildly dilated. There is moderate left  ventricular hypertrophy. Left ventricular diastolic function could not be  evaluated. The average left ventricular global longitudinal strain is -5.0  %. The global longitudinal strain is  abnormal.   2. Right ventricular systolic function is severely reduced. The right  ventricular size is mildly enlarged. Tricuspid regurgitation signal is  inadequate for assessing PA pressure.   3. Left atrial size was severely dilated.   4. The mitral valve has been repaired/replaced. Trivial mitral valve  regurgitation. The mean mitral valve gradient is 11.0 mmHg.   5. The aortic valve has an indeterminant number of cusps. Aortic valve  regurgitation is mild to moderate.   6. The inferior vena cava is normal in size with greater than 50%  respiratory variability, suggesting right atrial pressure of 3 mmHg.    LHC 10/03/2023 Conclusions: No angiographically significant atherosclerotic coronary artery disease.  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 Moderately elevated left heart, right heart, and pulmonary artery pressures.  Large v-waves observed on PCWP tracing consistent with the patient's severe mitral regurgitation. Low normal to mildly reduced Fick cardiac output/index.   Recommendations: Escalate diuresis and consider consultation with advanced heart failure team for optimization of acute diastolic heart failure in the setting of severe mitral valve regurgitation. Cardiac surgery consultation for mitral valve replacement.  Response to medical therapy will dictate if this can be done as an outpatient or if the patient will need transfer to [facility] for inpatient evaluation. Resume heparin  infusion 2 hours after TR band removal. Primary prevention of  coronary artery disease.   TEE 10/02/2023 1. Left ventricular ejection fraction, by estimation, is 55 to 60%. The  left ventricle has normal function. The left ventricle has no regional  wall motion abnormalities.   2. Right ventricular systolic function is low normal. The right  ventricular size is normal.   3. The mitral valve is rheumatic, both anterior and posterior leaflets  involved. Severe mitral valve regurgitation, central and eccentric jet  noted. No evidence of mitral stenosis.   4. The aortic valve is normal in structure. Aortic valve regurgitation is  not visualized. No aortic stenosis is present.   5. The inferior vena cava is normal in size with greater than 50%  respiratory variability, suggesting right atrial pressure of 3 mmHg.   6. No left atrial/left atrial appendage thrombus was detected.   7. 3D performed of the mitral valve and demonstrates rheumatic mitral  valve.    2D limited echo 10/01/2023 1. Left ventricular ejection fraction, by estimation, is 55 to 60%. The  left ventricle has normal function. The left ventricular internal cavity  size was severely dilated. There is mild left ventricular hypertrophy.   2. Left atrial size was severely dilated.   3. 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 No  evidence of mitral stenosis.   4. The aortic valve is tricuspid. Aortic valve regurgitation is trivial.   5. There is normal  pulmonary artery systolic pressure.   6. The inferior vena cava is normal in size with greater than 50%  respiratory variability, suggesting right atrial pressure of 3 mmHg.    2D echo 09/26/2023 1. Left ventricular ejection fraction, by estimation, is 45 to 50%. The  left ventricle has mildly decreased function. The left ventricle  demonstrates global hypokinesis. The left ventricular internal cavity size  was mildly dilated. There is mild left  ventricular hypertrophy. Left ventricular diastolic parameters are   indeterminate.   2. Right ventricular systolic function is mildly reduced. The right  ventricular size is normal. There is normal pulmonary artery systolic  pressure. The estimated right ventricular systolic pressure is 25.8 mmHg.   3. Left atrial size was severely dilated.   4. 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 No  evidence of mitral stenosis. The mean mitral valve gradient is 11.0 mmHg.  Moderate mitral annular calcification.   5. Tricuspid valve regurgitation is mild to moderate.   6. The aortic valve is tricuspid. Aortic valve regurgitation is mild. No  aortic stenosis is present.   7. There is borderline dilatation of the ascending aorta, measuring 38  mm.   8. The inferior vena cava is normal in size with greater than 50%  respiratory variability, suggesting right atrial pressure of 3 mmHg.  Risk Assessment/Calculations:    CHA2DS2-VASc Score = 3  {Confirm score is correct.  If not, click here to update score.  REFRESH note.  :1} This indicates a 3.2% annual risk of stroke. The patient's score is based upon: CHF History: 1 HTN History: 1 Diabetes History: 0 Stroke History: 0 Vascular Disease History: 0 Age Score: 0 Gender Score: 1   {This patient has a significant risk of stroke if diagnosed with atrial fibrillation.  Please consider VKA or DOAC agent for anticoagulation if the bleeding risk is acceptable.   You can also use the SmartPhrase .[email or web address] for documentation.   :[id number]} No BP recorded.  {Refresh Note OR Click here to enter BP  :1}***       Physical Exam:   VS:  There were no vitals taken for this visit.   Wt Readings from Last 3 Encounters:  11/28/23 194 lb (88 kg)  11/07/23 194 lb (88 kg)  11/01/23 194 lb 0.1 oz (88 kg)    GEN: Well nourished, well developed in no acute distress NECK: No JVD; No carotid bruits CARDIAC: ***RRR, no murmurs, rubs, gallops RESPIRATORY:  Clear to auscultation without rales, wheezing or  rhonchi  ABDOMEN: Soft, non-tender, non-distended EXTREMITIES:  No edema; No deformity   ASSESSMENT AND PLAN: .   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   Please indicate if the patient is ready to start. Do NOT delete this.  It will auto delete.  Refresh note, then sign.              Click here to document readiness and see contraindications.  :1}  Cardiac Rehabilitation Eligibility Assessment      {Are you ordering a CV Procedure (e.g. stress test, cath, DCCV, TEE, etc)?   Press F2        :[id number]}  Dispo: ***  Signed, Gennie Dib, NP

## 2023-12-11 NOTE — Progress Notes (Deleted)
 Established patient visit   Patient: Sandra Hines   DOB: Jan 25, 1971   53 y.o. Female  MRN: [id number] Visit Date: 12/11/2023  Today's healthcare provider: Mimi Alt, MD   No chief complaint on file.  Subjective       Discussed the use of AI scribe software for clinical note transcription with the patient, who gave verbal consent to proceed.  History of Present Illness      Past Medical History:  Diagnosis Date   Hypertension     Medications: Outpatient Medications Prior to Visit  Medication Sig   acetaminophen  (TYLENOL ) 325 MG tablet Take 2 tablets (650 mg total) by mouth every 4 (four) hours as needed for headache or mild pain (pain score 1-3).   ALPRAZolam  (XANAX ) 0.25 MG tablet Take 1 tablet (0.25 mg total) by mouth at bedtime as needed for anxiety.   amiodarone  (PACERONE ) 200 MG tablet Take 1 tablet (200 mg total) by mouth 2 (two) times daily.   amiodarone  (PACERONE ) 200 MG tablet Take 1 tablet (200 mg total) by mouth 2 (two) times daily.   aspirin  81 MG chewable tablet Chew 1 tablet (81 mg total) by mouth daily.   busPIRone  (BUSPAR ) 7.5 MG tablet Take 1 tablet (7.5 mg total) by mouth 2 (two) times daily.   busPIRone  (BUSPAR ) 7.5 MG tablet Take 1 tablet (7.5 mg total) by mouth 2 (two) times daily.   carvedilol  (COREG ) 3.125 MG tablet Take 1 tablet (3.125 mg total) by mouth 2 (two) times daily with a meal.   DULoxetine  (CYMBALTA ) 20 MG capsule Take 2 capsules (40 mg total) by mouth daily.   DULoxetine  (CYMBALTA ) 20 MG capsule Take 2 capsules (40 mg total) by mouth daily.   empagliflozin  (JARDIANCE ) 10 MG TABS tablet Take 1 tablet (10 mg total) by mouth daily.   Fe Fum-Vit C-Vit B12-FA (TRIGELS-F FORTE) CAPS capsule Take 1 capsule by mouth daily. (Patient not taking: Reported on 11/21/2023)   ondansetron  (ZOFRAN ) 4 MG tablet Take 1 tablet (4 mg total) by mouth every 8 (eight) hours as needed for nausea or vomiting. (Patient not taking: Reported on  11/28/2023)   oxyCODONE  (OXY IR/ROXICODONE ) 5 MG immediate release tablet Take 5 mg by mouth 3 (three) times daily as needed for severe pain (pain score 7-10). (Patient not taking: Reported on 11/21/2023)   polyethylene glycol powder (GLYCOLAX /MIRALAX ) 17 GM/SCOOP powder Take 17 g by mouth daily. Mix as directed. (Patient not taking: Reported on 11/28/2023)   rosuvastatin  (CRESTOR ) 40 MG tablet Take 1 tablet (40 mg total) by mouth daily.   sacubitril -valsartan  (ENTRESTO ) 24-26 MG Take 1 tablet by mouth 2 (two) times daily.   spironolactone  (ALDACTONE ) 25 MG tablet Take 0.5 tablets (12.5 mg total) by mouth daily.   torsemide  (DEMADEX ) 20 MG tablet Take 1 tablet (20 mg total) by mouth daily.   Vitamin D , Ergocalciferol , (DRISDOL ) 1.25 MG (50000 UNIT) CAPS capsule Take 1 capsule (50,000 Units total) by mouth every 7 (seven) days.   warfarin (COUMADIN ) 2.5 MG tablet Take 2 tablets (5 mg total) by mouth daily. Take as directed by coumadin  clinic.   No facility-administered medications prior to visit.    Review of Systems  {Insert previous labs (optional):23779} {See past labs  Heme  Chem  Endocrine  Serology  Results Review (optional):1}   Objective    There were no vitals taken for this visit. {Insert last BP/Wt (optional):23777}{See vitals history (optional):1}    Physical Exam  ***  No results  found for any visits on 12/11/23.  Assessment & Plan     Problem List Items Addressed This Visit   None   Assessment and Plan Assessment & Plan      No follow-ups on file.         Mimi Alt, MD  [facility] [phone] (phone) [phone] (fax)  [facility]

## 2023-12-17 ENCOUNTER — Other Ambulatory Visit: Payer: Self-pay

## 2023-12-17 ENCOUNTER — Telehealth: Payer: Self-pay | Admitting: Pharmacist

## 2023-12-17 NOTE — Progress Notes (Signed)
   12/17/2023  Patient ID: Sandra Hines, female   DOB: 02-13-1971, 53 y.o.   MRN: [id number]  Called and spoke with the patient on the phone today regarding medications.  Said that since she set them up for automatic refill instead of calling that they were filled at [facility] instead of mailing. Just spoke with pharmacy about needing Duloxetine , Spironolactone , and weekly Vitamin D .  Asked about upcoming appointment as she has concerns about mood and is feeling "crazy" lately with forgetfulness as well. Advised appointment was scheduled for 12/11/23, but patient No Showed it. Apologized as she had forgotten and just feels overwhelmed trying to remember these appointments.  Plans to call office to re-schedule visit and get prescriptions from [facility] later today. Advised Duloxetine  should help with mood concerns.    Called and spoke with the pharmacy after further review. Appears patient is due for: Carvedilol , Duloxetine , Jardiance , Ergocalciferol , Entresto , Spiro, warfarin (can fill today a little early)  They are going ahead and filling the rest of her medications as well. Warfarin will be ready a little later due to coming from central warehouse.   Delvin File, PharmD [facility]  Phone Number: [phone]

## 2023-12-19 ENCOUNTER — Other Ambulatory Visit: Payer: Self-pay

## 2023-12-23 ENCOUNTER — Telehealth: Payer: Self-pay | Admitting: *Deleted

## 2023-12-23 ENCOUNTER — Encounter: Payer: Self-pay | Admitting: *Deleted

## 2023-12-25 ENCOUNTER — Ambulatory Visit

## 2023-12-25 ENCOUNTER — Telehealth: Payer: Self-pay | Admitting: Internal Medicine

## 2023-12-25 NOTE — Telephone Encounter (Signed)
 Called to confirm/remind patient of their appointment [facility] on 12/26/[facility].   Appointment:   [] Confirmed  [x] Left mess   [] No answer/No voice mail  [] VM Full/unable to leave message  [] Phone not in service  Patient reminded to bring all medications and/or complete list.  Confirmed patient has transportation. Gave directions, instructed to utilize valet parking.

## 2023-12-26 ENCOUNTER — Encounter: Admitting: Internal Medicine

## 2023-12-30 ENCOUNTER — Ambulatory Visit: Attending: Cardiovascular Disease

## 2023-12-30 DIAGNOSIS — I77810 Thoracic aortic ectasia: Secondary | ICD-10-CM | POA: Diagnosis not present

## 2023-12-30 DIAGNOSIS — Z952 Presence of prosthetic heart valve: Secondary | ICD-10-CM | POA: Diagnosis not present

## 2023-12-30 DIAGNOSIS — I361 Nonrheumatic tricuspid (valve) insufficiency: Secondary | ICD-10-CM

## 2023-12-30 LAB — ECHOCARDIOGRAM COMPLETE
AR max vel: 2.21 cm2
AV Area VTI: 1.91 cm2
AV Area mean vel: 2.02 cm2
AV Mean grad: 7 mmHg
AV Peak grad: 12.8 mmHg
Ao pk vel: 1.79 m/s
Area-P 1/2: 2.66 cm2
MV VTI: 1.87 cm2
S' Lateral: 4.27 cm

## 2024-01-03 ENCOUNTER — Other Ambulatory Visit: Payer: Self-pay

## 2024-01-03 ENCOUNTER — Other Ambulatory Visit: Payer: Self-pay | Admitting: Family Medicine

## 2024-01-03 ENCOUNTER — Other Ambulatory Visit (HOSPITAL_COMMUNITY): Payer: Self-pay

## 2024-01-03 ENCOUNTER — Other Ambulatory Visit: Payer: Self-pay | Admitting: Internal Medicine

## 2024-01-03 DIAGNOSIS — F5102 Adjustment insomnia: Secondary | ICD-10-CM

## 2024-01-03 MED ORDER — ALPRAZOLAM 0.25 MG PO TABS
0.2500 mg | ORAL_TABLET | Freq: Every evening | ORAL | 0 refills | Status: DC | PRN
Start: 2024-01-03 — End: 2024-02-09
  Filled 2024-01-03 – 2024-01-06 (×2): qty 30, 30d supply, fill #0

## 2024-01-05 ENCOUNTER — Ambulatory Visit: Payer: Self-pay | Admitting: Cardiovascular Disease

## 2024-01-06 ENCOUNTER — Other Ambulatory Visit (HOSPITAL_COMMUNITY): Payer: Self-pay

## 2024-01-06 ENCOUNTER — Other Ambulatory Visit: Payer: Self-pay

## 2024-01-07 ENCOUNTER — Other Ambulatory Visit (HOSPITAL_COMMUNITY): Payer: Self-pay

## 2024-01-07 ENCOUNTER — Other Ambulatory Visit: Payer: Self-pay

## 2024-01-07 MED ORDER — AMIODARONE HCL 200 MG PO TABS
200.0000 mg | ORAL_TABLET | Freq: Two times a day (BID) | ORAL | 1 refills | Status: DC
Start: 2024-01-07 — End: 2024-02-21
  Filled 2024-01-07: qty 60, 30d supply, fill #0
  Filled 2024-02-09: qty 60, 30d supply, fill #1

## 2024-01-08 ENCOUNTER — Ambulatory Visit

## 2024-01-14 ENCOUNTER — Telehealth: Payer: Self-pay | Admitting: Pharmacist

## 2024-01-14 ENCOUNTER — Other Ambulatory Visit (HOSPITAL_COMMUNITY): Payer: Self-pay

## 2024-01-14 DIAGNOSIS — Z8679 Personal history of other diseases of the circulatory system: Secondary | ICD-10-CM

## 2024-01-14 MED ORDER — ROSUVASTATIN CALCIUM 40 MG PO TABS
40.0000 mg | ORAL_TABLET | Freq: Every day | ORAL | 1 refills | Status: DC
  Filled 2024-01-14: qty 30, 30d supply, fill #0
  Filled 2024-02-09: qty 30, 30d supply, fill #1
  Filled 2024-02-21 – 2024-03-11 (×2): qty 30, 30d supply, fill #2
  Filled 2024-03-25 – 2024-04-06 (×2): qty 30, 30d supply, fill #3
  Filled 2024-04-10 – 2024-04-25 (×2): qty 30, 30d supply, fill #0
  Filled 2024-06-06 (×2): qty 30, 30d supply, fill #1

## 2024-01-14 NOTE — Telephone Encounter (Signed)
 Please set up appointment for list of problems reported to pharmacist

## 2024-01-14 NOTE — Progress Notes (Signed)
   01/14/2024  Patient ID: Zada KATHEE Childes, female   DOB: 09-11-1970, 53 y.o.   MRN: [id number]  Med Timing Set-Up Currently:  6/24: Rosuvastatin , Tors 6/29: Spiro (7DS), Jardiance , Dulox, Entresto , Carv 6/17: Amio, Buspar , Warfarin  Calling pharmacy to get Rosuvastatin  and Torsemide  filled. Appears they needed Rosuvastatin  refills- sent to pharmacy.   Called the pharmacy and said her insurance only covers 30DS at a time. Working on setting up and filling now.  Patient also requesting a scheduler call her regarding: chest/back pain when bending down and pressure getting back up; rashes around scar; itches, night sweats   Aloysius Lewis, PharmD [facility]  Phone Number: [phone]

## 2024-01-15 ENCOUNTER — Ambulatory Visit: Attending: Cardiovascular Disease

## 2024-01-15 DIAGNOSIS — I4891 Unspecified atrial fibrillation: Secondary | ICD-10-CM

## 2024-01-15 DIAGNOSIS — Z5181 Encounter for therapeutic drug level monitoring: Secondary | ICD-10-CM | POA: Diagnosis not present

## 2024-01-15 DIAGNOSIS — Z952 Presence of prosthetic heart valve: Secondary | ICD-10-CM | POA: Diagnosis not present

## 2024-01-15 LAB — POCT INR: INR: 1.3 — AB (ref 2.0–3.0)

## 2024-01-15 NOTE — Telephone Encounter (Signed)
 Detailed voicemail left advising patient appointment scheduled for 07/01/ @ 8:00 am with Curtis Boom and if date or time does not work to call office and reschedule

## 2024-01-15 NOTE — Patient Instructions (Signed)
 Take another 1 tablet today and 3 tablets tomorrow then Continue 2 tablets Daily.   INR in 3 weeks  [phone]

## 2024-01-16 ENCOUNTER — Telehealth: Payer: Self-pay | Admitting: *Deleted

## 2024-01-16 NOTE — Progress Notes (Signed)
 Complex Care Management Care Guide Note  01/16/2024 Name: [name] MRN: [id number] DOB: 06-29-1971  Sandra Hines is a 53 y.o. year old female who is a primary care patient of Simmons-Robinson, Rockie, MD and is actively engaged with the care management team. I reached out to Sandra Hines by phone today to assist with re-scheduling  with the Licensed Clinical Child psychotherapist.  Follow up plan: Unsuccessful telephone outreach attempt made. A HIPAA compliant phone message was left for the patient providing contact information and requesting a return call.  Thedford Franks, CMA [place]  [facility], [facility] Guide Direct Dial: [phone]  Fax: [phone] Website: [email or web address]

## 2024-01-17 NOTE — Progress Notes (Signed)
 Complex Care Management Care Guide Note  01/17/2024 Name: MAEBELLE SULTON MRN: [id number] DOB: 1971-01-25  Zada KATHEE Childes is a 53 y.o. year old female who is a primary care patient of Simmons-Robinson, Rockie, MD and is actively engaged with the care management team. I reached out to Chelsy B Strine by phone today to assist with re-scheduling  with the Licensed Clinical Child psychotherapist.  Follow up plan: Unsuccessful telephone outreach attempt made. A HIPAA compliant phone message was left for the patient providing contact information and requesting a return call. No further outreach attempts will be made due to inability to maintain patient contact.   Thedford Franks, CMA [place]  [facility], [facility] Guide Direct Dial: [phone]  Fax: [phone] Website: [email or web address]

## 2024-01-20 ENCOUNTER — Other Ambulatory Visit: Payer: Self-pay

## 2024-01-20 ENCOUNTER — Telehealth: Payer: Self-pay | Admitting: Pharmacist

## 2024-01-20 NOTE — Progress Notes (Signed)
   01/20/2024  Patient ID: Sandra Hines, female   DOB: [birth year], 53 y.o.   MRN: [id number]  Called patient and notified her of visit at [facility] tomorrow at [facility] with Curtis Boom. Confirmed understanding and said she'd be there.  Also advised that her meds would be ready at [facility] next door, so she can swing by to pick these up as well. Thanked for the assistance.   Aloysius Lewis, PharmD [facility]  Phone Number: [phone]

## 2024-01-20 NOTE — Progress Notes (Signed)
 Called pharmacy to refill meds due today. Will try to call later today to advise about appt tomorrow and getting her meds from the pharmacy then as well.

## 2024-01-21 ENCOUNTER — Other Ambulatory Visit: Payer: Self-pay

## 2024-01-21 ENCOUNTER — Ambulatory Visit (INDEPENDENT_AMBULATORY_CARE_PROVIDER_SITE_OTHER): Admitting: Family Medicine

## 2024-01-21 ENCOUNTER — Encounter: Payer: Self-pay | Admitting: Family Medicine

## 2024-01-21 VITALS — BP 135/86 | HR 79 | Temp 98.3°F | Resp 16 | Ht 67.0 in | Wt 211.6 lb

## 2024-01-21 DIAGNOSIS — R61 Generalized hyperhidrosis: Secondary | ICD-10-CM | POA: Diagnosis not present

## 2024-01-21 DIAGNOSIS — M62838 Other muscle spasm: Secondary | ICD-10-CM

## 2024-01-21 DIAGNOSIS — R21 Rash and other nonspecific skin eruption: Secondary | ICD-10-CM | POA: Diagnosis not present

## 2024-01-21 MED ORDER — TRIAMCINOLONE ACETONIDE 0.025 % EX OINT
1.0000 | TOPICAL_OINTMENT | Freq: Two times a day (BID) | CUTANEOUS | 0 refills | Status: DC
  Filled 2024-01-21 – 2024-02-09 (×2): qty 30, 15d supply, fill #0

## 2024-01-21 MED ORDER — CYCLOBENZAPRINE HCL 5 MG PO TABS
5.0000 mg | ORAL_TABLET | Freq: Three times a day (TID) | ORAL | 1 refills | Status: DC | PRN
  Filled 2024-01-21 – 2024-02-09 (×2): qty 30, 10d supply, fill #0
  Filled 2024-02-12 – 2024-02-21 (×2): qty 30, 10d supply, fill #1

## 2024-01-21 NOTE — Progress Notes (Signed)
 Acute Office Visit  Introduced to nurse practitioner role and practice setting.  All questions answered.  Discussed provider/patient relationship and expectations.   Subjective:     Patient ID: Sandra Hines, female    DOB: 12/30/1970, 53 y.o.   MRN: [id number]  Chief Complaint  Patient presents with   chest soreness    Patient had heart surgery back in March. She is having chest soreness and rash on her scar. Rash was noticed x 3 weeks ago   Upper back pain    Started a week ago    Discussed the use of AI scribe software for clinical note transcription with the patient, who gave verbal consent to proceed.  History of Present Illness Sandra Hines is a 53 year old female who presents with chest soreness and a rash following heart surgery. She is accompanied by her youngest child.  Chest soreness: persistent since undergoing heart surgery a few months ago. The soreness affects her daily activities but has not worsened over time. No associated symptoms such as difficulty breathing, shortness of breath, syncope, numbness/tingling, headaches, or palpitations. States worst when bending to lift things and back between scapulas is tense as well. She describes chest soreness that feels muscular and is located around the ribcage, extending from a scar around to her back. Sitting up straight helps alleviate some discomfort. Heaviness in her shoulders and arms, though not pain. She has been taking Tylenol  nightly to manage the discomfort. She has had an echo during this chest soreness, and sees cardiology on 02/05/24.  Rash: small bumps near and around scar tissue. developed at chest surgery scar. Worsen by night sweats. Puts coconut oil and cocoa butter on area, but not improving. She feels the sweating makes it worse. Its not painful, but mildly itchy and irritating. Started three weeks ago. No one else in house has rash, denies bug bites. Wear bra rubs on scar. Denies food changes or soap/detergent  changes. Allergy to penicillin, but none others known.  She experiences night sweats, waking up drenched in sweat. This symptom is concerning to her. No recent recent infections, fever, chills, or weight loss. States has had her period in the last year, but not since her heart surgery in March 2025. She is currently on duloxetine  and a diuretic.  Review of Systems  All other systems reviewed and are negative.       Objective:    BP 135/86 (BP Location: Right Arm, Patient Position: Sitting, Cuff Size: Normal)   Pulse 79   Temp 98.3 F (36.8 C) (Oral)   Resp 16   Ht 5' 7 (1.702 m)   Wt 211 lb 9.6 oz (96 kg)   SpO2 98%   BMI 33.14 kg/m    Physical Exam Constitutional:      General: She is not in acute distress.    Appearance: Normal appearance. She is obese. She is not toxic-appearing or diaphoretic.  HENT:     Head: Normocephalic.     Nose: Nose normal.     Mouth/Throat:     Mouth: Mucous membranes are moist.     Pharynx: Oropharynx is clear.   Eyes:     Extraocular Movements: Extraocular movements intact.     Pupils: Pupils are equal, round, and reactive to light.    Cardiovascular:     Rate and Rhythm: Normal rate and regular rhythm.     Pulses: Normal pulses.          Radial pulses are  2+ on the right side and 2+ on the left side.       Femoral pulses are 2+ on the left side.      Popliteal pulses are 2+ on the right side and 2+ on the left side.       Dorsalis pedis pulses are 2+ on the right side and 2+ on the left side.     Heart sounds: No murmur heard.    No friction rub. No gallop.     Comments: Able to auscultate mechanical valve Pulmonary:     Effort: Pulmonary effort is normal. No respiratory distress.     Breath sounds: Normal breath sounds. No stridor. No wheezing, rhonchi or rales.  Chest:     Chest wall: No tenderness.   Musculoskeletal:        General: Tenderness present. No swelling or deformity.     Right lower leg: No edema.     Left lower  leg: No edema.   Skin:    General: Skin is warm and dry.     Capillary Refill: Capillary refill takes less than 2 seconds.   Neurological:     General: No focal deficit present.     Mental Status: She is alert and oriented to person, place, and time. Mental status is at baseline.     Cranial Nerves: No cranial nerve deficit.     Sensory: No sensory deficit.     Motor: No weakness.     Coordination: Coordination normal.     Gait: Gait normal.     Deep Tendon Reflexes: Reflexes normal.   Psychiatric:        Mood and Affect: Mood normal.        Behavior: Behavior normal.        Thought Content: Thought content normal.        Judgment: Judgment normal.     No results found for any visits on 01/21/24.       Assessment & Plan:  Assessment and Plan Assessment & Plan Chest soreness Post-heart surgery, on going for several weeks Had echo done during this chest soreness, which shown EF 40-45%, and functioning valves Heart rate normal and mechanical valve auscultated Denies acute pain, pain is reproducible Appears to be MSK related post surgery with scar tissue, pt is favoring leading less likely cardiac related given symptoms and chronicity  forward and hunch back, soreness when she stretches shoulders and to touch to scar tissue Will trial flexeril  5mg  prn TID for muscle relief Continue prn tyneol Recommend good posture, may heat and ice area, strengthen back muscle, stretch F/u with cards and cardiac rehab F/u with pcp if persists/worsens   Rash Appears to be dermatitis, pt rubs on scar and using scented oils and lotions. Trial of triamcinolone to raised itchy bumps Hold any scented oils, lotions, butters, soaps Recommend Aquaphor or Vaseline on scar tissue for healing process.  Body wash with unscented soap Limit friction to area, wear loose fitting clothing that is breathable Rash with small bumps, possibly stress-related, exacerbated by surgery recovery. Further  evaluation if needed. - Monitor changes in appearance or severity. - Consider dermatological evaluation if persistent or worsens.  Night sweats Denies fevers, infection, weight loss, cough Given age may be related to hormonal shift, no period since prior to march 2025.  Discussed Cymbalta  can help with sweats, which she is on Ensure daily hygiene Keep hydrated Ensure breathable linen/ bedding - cotton Discussed option to check hormone levels in future TSH  normal March 2025. CBC - normal as of April 2025 F/u with pcp for further eval    Problem List Items Addressed This Visit   None Visit Diagnoses       Rash    -  Primary   Relevant Medications   triamcinolone (KENALOG) 0.025 % ointment     Muscle spasm       Relevant Medications   cyclobenzaprine  (FLEXERIL ) 5 MG tablet       Meds ordered this encounter  Medications   triamcinolone (KENALOG) 0.025 % ointment    Sig: Apply 1 Application topically 2 (two) times daily.    Dispense:  30 g    Refill:  0   cyclobenzaprine  (FLEXERIL ) 5 MG tablet    Sig: Take 1 tablet (5 mg total) by mouth 3 (three) times daily as needed for muscle spasms.    Dispense:  30 tablet    Refill:  1    Return if symptoms worsen or fail to improve.  Curtis DELENA Boom, FNP  I, Curtis DELENA Boom, FNP, have reviewed all documentation for this visit. The documentation on 01/21/24 for the exam, diagnosis, procedures, and orders are all accurate and complete.

## 2024-02-03 ENCOUNTER — Other Ambulatory Visit: Payer: Self-pay

## 2024-02-05 ENCOUNTER — Ambulatory Visit: Attending: Cardiovascular Disease

## 2024-02-07 ENCOUNTER — Telehealth: Payer: Self-pay | Admitting: Pharmacist

## 2024-02-07 NOTE — Progress Notes (Signed)
   02/07/2024  Patient ID: Sandra Hines, female   DOB: Dec 15, 1970, 53 y.o.   MRN: [id number]  Called and left message requesting call back at earliest convenience.  Called about getting medications filled at [facility] or at [facility] (like last month). Medications that can be due together are the Buspirone , Warfarin, and Amiodarone .     Aloysius Lewis, PharmD [facility]  Phone Number: [phone]

## 2024-02-09 ENCOUNTER — Other Ambulatory Visit: Payer: Self-pay | Admitting: Family Medicine

## 2024-02-09 DIAGNOSIS — F5102 Adjustment insomnia: Secondary | ICD-10-CM

## 2024-02-10 ENCOUNTER — Other Ambulatory Visit: Payer: Self-pay

## 2024-02-10 NOTE — Telephone Encounter (Signed)
 LOV 10/30/23 NOV 03/24/24 LABS 01/03/24

## 2024-02-11 ENCOUNTER — Other Ambulatory Visit: Payer: Self-pay

## 2024-02-12 ENCOUNTER — Other Ambulatory Visit: Payer: Self-pay | Admitting: Cardiovascular Disease

## 2024-02-12 ENCOUNTER — Other Ambulatory Visit: Payer: Self-pay | Admitting: Family Medicine

## 2024-02-12 ENCOUNTER — Other Ambulatory Visit (HOSPITAL_COMMUNITY): Payer: Self-pay

## 2024-02-12 ENCOUNTER — Other Ambulatory Visit: Payer: Self-pay

## 2024-02-12 DIAGNOSIS — R21 Rash and other nonspecific skin eruption: Secondary | ICD-10-CM

## 2024-02-12 DIAGNOSIS — Z952 Presence of prosthetic heart valve: Secondary | ICD-10-CM

## 2024-02-12 MED ORDER — TRIAMCINOLONE ACETONIDE 0.025 % EX OINT
1.0000 | TOPICAL_OINTMENT | Freq: Two times a day (BID) | CUTANEOUS | 0 refills | Status: DC
Start: 2024-02-12 — End: 2024-04-06
  Filled 2024-02-12 – 2024-03-25 (×4): qty 30, 15d supply, fill #0

## 2024-02-12 MED ORDER — ALPRAZOLAM 0.25 MG PO TABS
0.2500 mg | ORAL_TABLET | Freq: Every evening | ORAL | 0 refills | Status: DC | PRN
  Filled 2024-02-12: qty 30, 30d supply, fill #0

## 2024-02-12 MED ORDER — WARFARIN SODIUM 2.5 MG PO TABS
ORAL_TABLET | ORAL | 0 refills | Status: DC
  Filled 2024-02-12: qty 60, 30d supply, fill #0

## 2024-02-12 NOTE — Telephone Encounter (Signed)
 Prescription refill request received for warfarin Lov: 10/29/23 ([facility])   Next INR check: 02/05/24 Warfarin tablet strength: 2.5mg   Overdue for coumadin  clinic appt. Scheduled coumadin  clinic appt in [place] on 02/19/24 at 1:30pm prior to seeing provider at 2:45pm.

## 2024-02-19 ENCOUNTER — Other Ambulatory Visit (HOSPITAL_COMMUNITY): Payer: Self-pay

## 2024-02-19 ENCOUNTER — Ambulatory Visit: Attending: Cardiology | Admitting: Cardiology

## 2024-02-19 ENCOUNTER — Ambulatory Visit (INDEPENDENT_AMBULATORY_CARE_PROVIDER_SITE_OTHER)

## 2024-02-19 ENCOUNTER — Encounter: Payer: Self-pay | Admitting: Cardiology

## 2024-02-19 VITALS — BP 139/80 | HR 72 | Ht 67.0 in | Wt 221.8 lb

## 2024-02-19 DIAGNOSIS — Z952 Presence of prosthetic heart valve: Secondary | ICD-10-CM

## 2024-02-19 DIAGNOSIS — F419 Anxiety disorder, unspecified: Secondary | ICD-10-CM

## 2024-02-19 DIAGNOSIS — I340 Nonrheumatic mitral (valve) insufficiency: Secondary | ICD-10-CM | POA: Diagnosis not present

## 2024-02-19 DIAGNOSIS — I5020 Unspecified systolic (congestive) heart failure: Secondary | ICD-10-CM

## 2024-02-19 DIAGNOSIS — F32A Depression, unspecified: Secondary | ICD-10-CM | POA: Diagnosis not present

## 2024-02-19 DIAGNOSIS — R0789 Other chest pain: Secondary | ICD-10-CM

## 2024-02-19 DIAGNOSIS — E782 Mixed hyperlipidemia: Secondary | ICD-10-CM

## 2024-02-19 DIAGNOSIS — I4891 Unspecified atrial fibrillation: Secondary | ICD-10-CM

## 2024-02-19 DIAGNOSIS — I428 Other cardiomyopathies: Secondary | ICD-10-CM | POA: Diagnosis not present

## 2024-02-19 DIAGNOSIS — I10 Essential (primary) hypertension: Secondary | ICD-10-CM | POA: Diagnosis not present

## 2024-02-19 DIAGNOSIS — I480 Paroxysmal atrial fibrillation: Secondary | ICD-10-CM

## 2024-02-19 DIAGNOSIS — Z5181 Encounter for therapeutic drug level monitoring: Secondary | ICD-10-CM

## 2024-02-19 LAB — POCT INR: INR: 3 (ref 2.0–3.0)

## 2024-02-19 MED ORDER — SPIRONOLACTONE 25 MG PO TABS
12.5000 mg | ORAL_TABLET | Freq: Every day | ORAL | 11 refills | Status: DC
  Filled 2024-02-19: qty 15, 30d supply, fill #0
  Filled 2024-02-21: qty 15, 30d supply, fill #1
  Filled 2024-03-25: qty 15, 30d supply, fill #0
  Filled 2024-03-25 – 2024-05-05 (×4): qty 15, 30d supply, fill #1
  Filled 2024-06-06 (×2): qty 15, 30d supply, fill #2

## 2024-02-19 NOTE — Progress Notes (Signed)
 Patient left before AVS was printed - called patient - left message on voicemail to return to the office to schedule follow up and have labs collected

## 2024-02-19 NOTE — Progress Notes (Signed)
 Cardiology Office Note   Date:  02/19/2024  ID:  Sandra Hines, DOB 07-17-1971, MRN [id number] PCP: Sandra Coyer, MD  [facility] Providers Cardiologist:  Sandra Lunger, MD     History of Present Illness Sandra Hines is a 53 y.o. 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   Sandra Hines who previously presented to [facility] emergency department 09/25/2023 with acute onset of palpitations, shortness of breath, generalized weakness and malaise, and cough with upper respiratory symptoms not improving.  She was found to be hypertensive emergency with a blood pressure of 211/154 in atrial fibrillation with RVR.  EKG revealed suspected atrial fibrillation with rate of 184 with repolarization abnormality.  Chest x-ray showed mild cardiomegaly with mild central pulmonary vascular congestion.  Cardiology was consulted.  She was placed on Cardizem  drip initially but due to lack of response was placed on amiodarone .  She was given bisoprolol  and amlodipine  for blood pressure control.  She was restarted on rosuvastatin  after lipid panel was completed.  She did require BiPAP briefly then high flow nasal cannula.  She was treated for possible bronchitis and early pneumonia.  She was diuresed with furosemide .  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  She was placed on a heparin  drip for  atrial fibrillation.  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  She converted to sinus rhythm and was transition to oral amiodarone .  Bisoprolol  twice daily was continued.  Cardiac catheterization was done 10/03/2023 showed no significant atherosclerotic coronary artery disease.  She was transferred to [facility] for further evaluation and treatment on 10/03/2023.  Cardiothoracic surgery (Dr. Maryjane) was consulted for consideration of mitral valve repair/replacement.  Dr. Maryjane discussed the need for median sternotomy for mitral valve replacement using mechanical mitral valve, PVI MAZE, and consideration of replacement of ascending thoracic aorta.   She underwent median sternotomy for mechanical mitral valve replacement and PVI maze.  Aortic valve dilation was not severe enough to warrant repair.  Following procedure she was separated from cardiopulmonary bypass without difficulty and was transferred to the ICU in stable condition.  She was extubated early in the afternoon of surgery.  She did have underlying sinus with bigeminy after surgery.  She also had A-fib with RVR on admission and was not initially treated with amiodarone  or Lopressor  postoperatively because of sinus arrest/atrial bigeminy.  Advanced heart failure followed her postop and diuresed her with IV Lasix  and she was started on BiDil  half a tablet 3 times daily.  On 3/18 she was not stable to be advanced to full GDMT but was recommended to continue to escalate it on an outpatient basis.  She was started on low-dose warfarin with PT and INR monitored daily.  Unfortunately she was not started on Lovenox  postoperatively due to thrombocytopenia.  She had to be started on oral amiodarone  200 mg twice daily for  the atrial fibrillation.  EP was asked to evaluate as she had paroxysms of atrial fibrillation and  bradycardia when she converted.  Bradycardia events subsided over time and it was not felt that she would require pacemaker placement.  She had received a course of diuretics during the hospitalization but was thought that ongoing diuresis at discharge was not needed.  She was started on IV ceftriaxone  for possible bronchitis due to leukocytosis and cough.  She was felt stable for discharge out of the ICU.  She continued to make ongoing improvements in her physical recovery.  Incisions are noted to be healing well without evidence of infection.  Oxygen has been weaned and she was maintaining saturations on room air.  INR was therapeutic at 2.7.  So she was considered stable was able to be discharged from the facility on 10/14/2023.  She presented to [facility] emergency department from home on 10/11/2023 with midsternal chest pain and reported sharp/stabbing discomfort, shortness of breath, increased swelling, weight gain of 7 pounds.  His symptoms worsened over the last 24 hours.  She previously been discharged from [facility] stating that she was not placed on diuretic therapy and that she required diuresis during hospitalization.  BNP was 1629.6, high-sensitivity troponin 48, 490, 449.  Chest x-ray revealed persistent left basilar opacity, atelectasis versus pneumonia probable small left pleural effusion, mild right perihilar interstitial opacity which could reflect edema versus infection.  She was treated with IV diureses.  Left heart catheterization completed on 10/03/2023 showed no angiographically significant coronary artery disease.  High-sensitivity troponins being elevated were considered low suspicion for ACS.  Echocardiogram was repeated during hospitalization which revealed significant drop in LVEF of 25 to 30% with RV severely reduced to previous 45 to 50%.  She was diuresed adequately during hospitalization and was discharged on torsemide  20 mg p.o. daily, but it was continued discontinued and she was started on  Entresto  24/26 mg twice daily and Jardiance  10 mg daily.  Recommendation was to continue to escalate GDMT and repeat echocardiogram.  She was considered stable at discharge and the facility 10/22/2023.  She was seen in clinic 10/25/2023 and continued to have shortness of breath and chest discomfort.  She stated improved since she was down 70 pounds after being diuresed in the hospital.  She was concerned about her PT and on as she only had several days of Lovenox  left.  There was waiting upcoming echocardiogram status post MVR scheduled for follow-up appointment with advanced heart failure clinic.   She was evaluated in clinic by Dr. Cherrie on 10/29/23.  She was accompanied by her sister-in-law states that her breathing is okay.  She is feels fatigue and tires easily.  She was euvolemic on exam.  In clinic she was found to be back in atrial flutter with RVR.  She was scheduled for a direct current cardioversion later in the week.  Amiodarone  was increased to 200 mg twice daily.  She was referred to cardiac rehab.  She underwent cardioversion procedure 11/01/23.  She received a single biphasic synchronized shock at 115 J with prompt conversion to sinus rhythm with no immediate postprocedure complications.  She returns clinic today she returns to clinic today stating overall she has done better.  She continues complain of chest soreness and discomfort around her incision that wraps around under her breast both sides under her arms into her back.  She states it is slowly improving.  She denies any shortness of breath or peripheral edema.  States that she  has been compliant with her current medication regimen with the exception of her spironolactone  and she was originally taking a half a tablet and was taking a whole tablet now she has run out of her spironolactone .  She has noted some nights that she has been unable to sleep.  She is still awaiting a call from cardiac rehab.  She has been released from [facility]TS.  She  denies any hospitalizations or visits to the emergency department.  ROS: 10 point review of systems were reviewed and considered negative except ones were listed in HPI  Studies Reviewed EKG Interpretation Date/Time:  Wednesday February 19 2024 14:23:36 EDT Ventricular Rate:  72 PR Interval:  210 QRS Duration:  114 QT Interval:  470 QTC Calculation: 514 R Axis:   63  Text Interpretation: Sinus rhythm with 1st degree A-V block Cannot rule out Anterior infarct (cited on or before 06-Nov-2023) Prolonged QT When compared with ECG of 06-Nov-2023 15:38, No significant change was found Confirmed by Gerard Frederick ([id number]) on 02/19/2024 2:44:49 PM    2D echo 12/30/2023  1. Left ventricular ejection fraction, by estimation, is 40 to 45%. Left  ventricular ejection fraction by PLAX is 37 %. The left ventricle has mild  to moderately decreased function. The left ventricle demonstrates global  hypokinesis. Left ventricular  diastolic parameters are indeterminate. The average left ventricular  global longitudinal strain is -12.3 %. The global longitudinal strain is  abnormal.   2. Right ventricular systolic function is normal. The right ventricular  size is normal.   3. The mitral valve has been repaired/replaced. No evidence of mitral  valve regurgitation. The mean mitral valve gradient is 4.0 mmHg. There is  a mechanical valve present in the mitral position.   4. The aortic valve is tricuspid. Aortic valve regurgitation is not  visualized.   5. Aortic dilatation noted. There is mild dilatation of the ascending  aorta, measuring 40 mm.   6. The inferior vena cava is normal in size with greater than 50%  respiratory variability, suggesting right atrial pressure of 3 mmHg.   2D echo 10/21/2023 1. Left ventricular ejection fraction, by estimation, is 25 to 30%. Left  ventricular ejection fraction by 3D volume is 18 %. The left ventricle has  severely decreased function. The left ventricle demonstrates  global  hypokinesis. The left ventricular  internal cavity size was mildly dilated. There is moderate left  ventricular hypertrophy. Left ventricular diastolic function could not be  evaluated. The average left ventricular global longitudinal strain is -5.0  %. The global longitudinal strain is  abnormal.   2. Right ventricular systolic function is severely reduced. The right  ventricular size is mildly enlarged. Tricuspid regurgitation signal is  inadequate for assessing PA pressure.   3. Left atrial size was severely dilated.   4. The mitral valve has been repaired/replaced. Trivial mitral valve  regurgitation. The mean mitral valve gradient is 11.0 mmHg.   5. The aortic valve has an indeterminant number of cusps. Aortic valve  regurgitation is mild to moderate.   6. The inferior vena cava is normal in size with greater than 50%  respiratory variability, suggesting right atrial pressure of 3 mmHg.    LHC 10/03/2023 Conclusions: No angiographically significant atherosclerotic coronary artery disease.  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 Moderately elevated left heart, right heart, and pulmonary artery pressures.  Large v-waves observed on PCWP tracing consistent with the patient's severe mitral regurgitation. Low normal to mildly reduced Fick cardiac  output/index.   Recommendations: Escalate diuresis and consider consultation with advanced heart failure team for optimization of acute diastolic heart failure in the setting of severe mitral valve regurgitation. Cardiac surgery consultation for mitral valve replacement.  Response to medical therapy will dictate if this can be done as an outpatient or if the patient will need transfer to [facility] for inpatient evaluation. Resume heparin  infusion 2 hours after TR band removal. Primary prevention of coronary artery disease.   TEE 10/02/2023 1. Left ventricular ejection fraction, by estimation, is 55 to 60%. The  left  ventricle has normal function. The left ventricle has no regional  wall motion abnormalities.   2. Right ventricular systolic function is low normal. The right  ventricular size is normal.   3. The mitral valve is rheumatic, both anterior and posterior leaflets  involved. Severe mitral valve regurgitation, central and eccentric jet  noted. No evidence of mitral stenosis.   4. The aortic valve is normal in structure. Aortic valve regurgitation is  not visualized. No aortic stenosis is present.   5. The inferior vena cava is normal in size with greater than 50%  respiratory variability, suggesting right atrial pressure of 3 mmHg.   6. No left atrial/left atrial appendage thrombus was detected.   7. 3D performed of the mitral valve and demonstrates rheumatic mitral  valve.    2D limited echo 10/01/2023 1. Left ventricular ejection fraction, by estimation, is 55 to 60%. The  left ventricle has normal function. The left ventricular internal cavity  size was severely dilated. There is mild left ventricular hypertrophy.   2. Left atrial size was severely dilated.   3. 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 No  evidence of mitral stenosis.   4. The aortic valve is tricuspid. Aortic valve regurgitation is trivial.   5. There is normal pulmonary artery systolic pressure.   6. The inferior vena cava is normal in size with greater than 50%  respiratory variability, suggesting right atrial pressure of 3 mmHg.    2D echo 09/26/2023 1. Left ventricular ejection fraction, by estimation, is 45 to 50%. The  left ventricle has mildly decreased function. The left ventricle  demonstrates global hypokinesis. The left ventricular internal cavity size  was mildly dilated. There is mild left  ventricular hypertrophy. Left ventricular diastolic parameters are  indeterminate.   2. Right ventricular systolic function is mildly reduced. The right  ventricular size is normal. There is  normal pulmonary artery systolic  pressure. The estimated right ventricular systolic pressure is 25.8 mmHg.   3. Left atrial size was severely dilated.   4. 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 No  evidence of mitral stenosis. The mean mitral valve gradient is 11.0 mmHg.  Moderate mitral annular calcification.   5. Tricuspid valve regurgitation is mild to moderate.   6. The aortic valve is tricuspid. Aortic valve regurgitation is mild. No  aortic stenosis is present.   7. There is borderline dilatation of the ascending aorta, measuring 38  mm.   8. The inferior vena cava is normal in size with greater than 50%  respiratory variability, suggesting right atrial pressure of 3 mmHg.  Risk Assessment/Calculations  CHA2DS2-VASc Score = 3   This indicates a 3.2% annual risk of stroke. The patient's score is based upon: CHF History: 1 HTN History: 1 Diabetes History: 0 Stroke History: 0 Vascular Disease History: 0 Age Score: 0 Gender Score: 1  Physical Exam VS:  BP 139/80 (BP Location: Left Arm, Patient Position: Sitting, Cuff Size: Large)   Pulse 72   Ht 5' 7 (1.702 m)   Wt 221 lb 12.8 oz (100.6 kg)   SpO2 99%   BMI 34.74 kg/m        Wt Readings from Last 3 Encounters:  02/19/24 221 lb 12.8 oz (100.6 kg)  01/21/24 211 lb 9.6 oz (96 kg)  11/28/23 194 lb (88 kg)    GEN: Well nourished, well developed in no acute distress NECK: No JVD; No carotid bruits CARDIAC: RRR, no murmurs, rubs, gallops RESPIRATORY:  Clear with diminished bases to auscultation without rales, wheezing or rhonchi  ABDOMEN: Soft, non-tender, obese, non-distended EXTREMITIES:  No edema; No deformity   ASSESSMENT AND PLAN HFrEF/NICM with last LVEF on echocardiogram 25 to 30% with RV severely reduced from previous.  She was diuresed during her last hospitalization and discharged on torsemide  20 mg daily, continued on GDMT of Entresto  24/26 mg twice daily, Jardiance  10  mg daily carvedilol  3.25 mg twice daily and spironolactone  12.5 mg daily which unfortunately she has been taking a full dose of 25 mg daily.  Discussed to escalate GDMT as labile blood pressure and kidney function.  No medication changes were made today although she has required a refill on her spironolactone .  We did discuss repeating echocardiogram after she had her cardioversion from her atrial fibrillation to determine if her heart function has improved.  With everything that she has going on she would like to defer until next visit to schedule updated echo which is reasonable.  Atypical chest discomfort status post surgery that appears to be musculoskeletal.  EKG today reveals sinus rhythm with prescriptively with a rate of 72 with an old anterior infarct with no acute ischemic changes noted.  No further ischemic evaluation is needed at this time.  She was recently placed on Flexeril  by her PCP for ongoing muscular spasms.  Paroxysmal atrial fibrillation status post maze and LAA occlusion 10/07/2023 and atrial flutter that was RVR status post DCCV 11/01/23.  EKG today reveals sinus rhythm.  She is continued on warfarin for CHA2DS2-VASc score of at least 3 for stroke prophylaxis.  Therapeutic INR of 2.5-3.5 which she was 3.0 today.  She is continued on amiodarone  200 mg twice daily and being sent for updated lab ups today of CBC, CMP, TSH.  Discussed de-escalating amiodarone  from 200 mg twice daily to 200 mg daily.  Mitral regurgitation status post MVR continue on aspirin  81 mg daily and warfarin.  She continues to follow-up with Coumadin  clinic for INR management.  Has been released from a surgery status post her mitral valve replacement.  Will continue to monitor with surveillance echocardiograms.  Hypertension with a blood pressure today 139/80.  She is continued on carvedilol  3.125 mg twice daily, Jardiance  10 mg daily, Entresto  24/26 mg twice daily, spironolactone  12.5 mg daily and torsemide  20 mg  daily.  She has been encouraged to continue to monitor pressure 1 to 2 hours postmedication administration as well.  Hyperlipidemia with most recent LDL of 118.  She has been scheduled for updated labs has been continued on rosuvastatin  40 mg daily.  If she continues use to remain above goal consider adding ezetimibe 10 mg daily.  Anxiety which she is continued on Xanax  and BuSpar  as well as Cymbalta .  Ongoing management per PCP        Dispo: Patient return to clinic to see MD/APP in 3 months  or sooner if needed for further evaluation.  Referral sent back to cardiac rehab and follow-up arranged with advanced heart failure.  Signed, Anthoney Sheppard, NP

## 2024-02-19 NOTE — Patient Instructions (Signed)
 Referral to Cardiac rehab   Medication Instructions:  Your physician recommends that you continue on your current medications as directed. Please refer to the Current Medication list given to you today.   *If you need a refill on your cardiac medications before your next appointment, please call your pharmacy*  Lab Work: Your provider would like for you to have following labs drawn today CBC, CMP, TSH.   If you have labs (blood work) drawn today and your tests are completely normal, you will receive your results only by: MyChart Message (if you have MyChart) OR A paper copy in the mail If you have any lab test that is abnormal or we need to change your treatment, we will call you to review the results.  Testing/Procedures: No test ordered today   Follow-Up: At [facility], you and your health needs are our priority.  As part of our continuing mission to provide you with exceptional heart care, our providers are all part of one team.  This team includes your primary Cardiologist (physician) and Advanced Practice Providers or APPs (Physician Assistants and Nurse Practitioners) who all work together to provide you with the care you need, when you need it.  Your next appointment:   3 month(s)  Provider:   Timothy Gollan, MD or Tylene Lunch, NP

## 2024-02-19 NOTE — Patient Instructions (Signed)
 Continue 2 tablets Daily.   INR in 5 weeks  [phone]

## 2024-02-21 ENCOUNTER — Other Ambulatory Visit: Payer: Self-pay | Admitting: Family Medicine

## 2024-02-21 ENCOUNTER — Other Ambulatory Visit (HOSPITAL_COMMUNITY): Payer: Self-pay

## 2024-02-21 ENCOUNTER — Other Ambulatory Visit: Payer: Self-pay

## 2024-02-21 ENCOUNTER — Other Ambulatory Visit: Payer: Self-pay | Admitting: Internal Medicine

## 2024-02-21 DIAGNOSIS — Z952 Presence of prosthetic heart valve: Secondary | ICD-10-CM

## 2024-02-21 DIAGNOSIS — F5102 Adjustment insomnia: Secondary | ICD-10-CM

## 2024-02-21 MED ORDER — WARFARIN SODIUM 2.5 MG PO TABS
ORAL_TABLET | ORAL | 0 refills | Status: DC
  Filled 2024-02-21: qty 60, fill #0
  Filled 2024-03-11: qty 60, 30d supply, fill #0

## 2024-02-21 MED ORDER — AMIODARONE HCL 200 MG PO TABS
200.0000 mg | ORAL_TABLET | Freq: Two times a day (BID) | ORAL | 1 refills | Status: DC
Start: 2024-02-21 — End: 2024-06-06
  Filled 2024-02-21 – 2024-03-11 (×2): qty 60, 30d supply, fill #0
  Filled 2024-03-25 – 2024-04-06 (×3): qty 60, 30d supply, fill #1
  Filled 2024-04-10 – 2024-04-25 (×2): qty 60, 30d supply, fill #0

## 2024-02-21 MED ORDER — BUSPIRONE HCL 7.5 MG PO TABS
7.5000 mg | ORAL_TABLET | Freq: Two times a day (BID) | ORAL | 1 refills | Status: DC
  Filled 2024-02-21: qty 120, 60d supply, fill #0
  Filled 2024-03-11: qty 60, 30d supply, fill #0
  Filled 2024-03-25 – 2024-04-04 (×2): qty 60, 30d supply, fill #1
  Filled 2024-04-10 – 2024-04-25 (×2): qty 60, 30d supply, fill #0
  Filled 2024-06-06 (×2): qty 60, 30d supply, fill #1

## 2024-02-24 ENCOUNTER — Other Ambulatory Visit (HOSPITAL_COMMUNITY): Payer: Self-pay

## 2024-02-27 ENCOUNTER — Other Ambulatory Visit: Payer: Self-pay | Admitting: *Deleted

## 2024-02-27 DIAGNOSIS — Z952 Presence of prosthetic heart valve: Secondary | ICD-10-CM

## 2024-03-05 ENCOUNTER — Other Ambulatory Visit (HOSPITAL_COMMUNITY): Payer: Self-pay

## 2024-03-11 ENCOUNTER — Other Ambulatory Visit (HOSPITAL_COMMUNITY): Payer: Self-pay

## 2024-03-11 ENCOUNTER — Other Ambulatory Visit: Payer: Self-pay | Admitting: Family Medicine

## 2024-03-11 ENCOUNTER — Other Ambulatory Visit: Payer: Self-pay

## 2024-03-11 DIAGNOSIS — F5102 Adjustment insomnia: Secondary | ICD-10-CM

## 2024-03-11 MED ORDER — ALPRAZOLAM 0.25 MG PO TABS
0.2500 mg | ORAL_TABLET | Freq: Every evening | ORAL | 0 refills | Status: DC | PRN
  Filled 2024-03-11: qty 30, 30d supply, fill #0

## 2024-03-24 ENCOUNTER — Encounter: Admitting: Family Medicine

## 2024-03-24 NOTE — Progress Notes (Deleted)
 Established patient visit   Patient: Sandra Hines   DOB: August 29, 1970   53 y.o. Female  MRN: [id number] Visit Date: 03/24/2024  Today's healthcare provider: Rockie Agent, MD   No chief complaint on file.  Subjective       Discussed the use of AI scribe software for clinical note transcription with the patient, who gave verbal consent to proceed.  History of Present Illness      Past Medical History:  Diagnosis Date   Hypertension     Medications: Outpatient Medications Prior to Visit  Medication Sig   acetaminophen  (TYLENOL ) 325 MG tablet Take 2 tablets (650 mg total) by mouth every 4 (four) hours as needed for headache or mild pain (pain score 1-3).   ALPRAZolam  (XANAX ) 0.25 MG tablet Take 1 tablet (0.25 mg total) by mouth at bedtime as needed for anxiety.   amiodarone  (PACERONE ) 200 MG tablet Take 1 tablet (200 mg total) by mouth 2 (two) times daily.   amiodarone  (PACERONE ) 200 MG tablet Take 1 tablet (200 mg total) by mouth 2 (two) times daily.   aspirin  81 MG chewable tablet Chew 1 tablet (81 mg total) by mouth daily.   busPIRone  (BUSPAR ) 7.5 MG tablet Take 1 tablet (7.5 mg total) by mouth 2 (two) times daily.   busPIRone  (BUSPAR ) 7.5 MG tablet Take 1 tablet (7.5 mg total) by mouth 2 (two) times daily.   carvedilol  (COREG ) 3.125 MG tablet Take 1 tablet (3.125 mg total) by mouth 2 (two) times daily with a meal.   cyclobenzaprine  (FLEXERIL ) 5 MG tablet Take 1 tablet (5 mg total) by mouth 3 (three) times daily as needed for muscle spasms.   DULoxetine  (CYMBALTA ) 20 MG capsule Take 2 capsules (40 mg total) by mouth daily.   DULoxetine  (CYMBALTA ) 20 MG capsule Take 2 capsules (40 mg total) by mouth daily.   empagliflozin  (JARDIANCE ) 10 MG TABS tablet Take 1 tablet (10 mg total) by mouth daily.   Fe Fum-Vit C-Vit B12-FA (TRIGELS-F FORTE) CAPS capsule Take 1 capsule by mouth daily.   ondansetron  (ZOFRAN ) 4 MG tablet Take 1 tablet (4 mg total) by mouth every 8  (eight) hours as needed for nausea or vomiting.   oxyCODONE  (OXY IR/ROXICODONE ) 5 MG immediate release tablet Take 5 mg by mouth 3 (three) times daily as needed for severe pain (pain score 7-10).   polyethylene glycol powder (GLYCOLAX /MIRALAX ) 17 GM/SCOOP powder Take 17 g by mouth daily. Mix as directed.   rosuvastatin  (CRESTOR ) 40 MG tablet Take 1 tablet (40 mg total) by mouth daily.   sacubitril -valsartan  (ENTRESTO ) 24-26 MG Take 1 tablet by mouth 2 (two) times daily.   spironolactone  (ALDACTONE ) 25 MG tablet Take 0.5 tablets (12.5 mg total) by mouth daily.   torsemide  (DEMADEX ) 20 MG tablet Take 1 tablet (20 mg total) by mouth daily.   triamcinolone  (KENALOG ) 0.025 % ointment Apply 1 Application topically 2 (two) times daily.   warfarin (COUMADIN ) 2.5 MG tablet Take 2 tablets by mouth daily or as directed by coumadin  clinic   No facility-administered medications prior to visit.    Review of Systems  {Insert previous labs (optional):23779} {See past labs  Heme  Chem  Endocrine  Serology  Results Review (optional):1}   Objective    There were no vitals taken for this visit. {Insert last BP/Wt (optional):23777}{See vitals history (optional):1}    Physical Exam  ***  No results found for any visits on 03/24/24.  Assessment & Plan  Problem List Items Addressed This Visit   None Visit Diagnoses       Forgetfulness    -  Primary       Assessment and Plan Assessment & Plan      No follow-ups on file.         Rockie Agent, MD  [facility] [phone] (phone) [phone] (fax)  [facility]

## 2024-03-25 ENCOUNTER — Other Ambulatory Visit: Payer: Self-pay | Admitting: Cardiology

## 2024-03-25 ENCOUNTER — Other Ambulatory Visit: Payer: Self-pay

## 2024-03-25 ENCOUNTER — Ambulatory Visit: Attending: Cardiovascular Disease

## 2024-03-25 ENCOUNTER — Other Ambulatory Visit (HOSPITAL_COMMUNITY): Payer: Self-pay

## 2024-03-25 ENCOUNTER — Other Ambulatory Visit: Payer: Self-pay | Admitting: Family Medicine

## 2024-03-25 ENCOUNTER — Encounter: Admitting: Family Medicine

## 2024-03-25 DIAGNOSIS — M62838 Other muscle spasm: Secondary | ICD-10-CM

## 2024-03-25 DIAGNOSIS — F5102 Adjustment insomnia: Secondary | ICD-10-CM

## 2024-03-26 ENCOUNTER — Other Ambulatory Visit: Payer: Self-pay

## 2024-03-27 ENCOUNTER — Encounter (HOSPITAL_COMMUNITY): Payer: Self-pay

## 2024-03-27 ENCOUNTER — Other Ambulatory Visit (HOSPITAL_COMMUNITY): Payer: Self-pay

## 2024-04-04 ENCOUNTER — Emergency Department: Payer: MEDICAID

## 2024-04-04 ENCOUNTER — Other Ambulatory Visit: Payer: Self-pay

## 2024-04-04 ENCOUNTER — Observation Stay
Admission: EM | Admit: 2024-04-04 | Discharge: 2024-04-05 | Disposition: A | Discharge status: Home / Self Care | Payer: MEDICAID | Attending: Student | Admitting: Student

## 2024-04-04 DIAGNOSIS — R0789 Other chest pain: Secondary | ICD-10-CM

## 2024-04-04 DIAGNOSIS — Z7982 Long term (current) use of aspirin: Secondary | ICD-10-CM | POA: Diagnosis not present

## 2024-04-04 DIAGNOSIS — I110 Hypertensive heart disease with heart failure: Secondary | ICD-10-CM | POA: Insufficient documentation

## 2024-04-04 DIAGNOSIS — F17210 Nicotine dependence, cigarettes, uncomplicated: Secondary | ICD-10-CM | POA: Insufficient documentation

## 2024-04-04 DIAGNOSIS — R0602 Shortness of breath: Secondary | ICD-10-CM

## 2024-04-04 DIAGNOSIS — I10 Essential (primary) hypertension: Secondary | ICD-10-CM | POA: Diagnosis present

## 2024-04-04 DIAGNOSIS — E042 Nontoxic multinodular goiter: Secondary | ICD-10-CM | POA: Diagnosis not present

## 2024-04-04 DIAGNOSIS — I4892 Unspecified atrial flutter: Secondary | ICD-10-CM | POA: Diagnosis not present

## 2024-04-04 DIAGNOSIS — F32A Depression, unspecified: Secondary | ICD-10-CM | POA: Diagnosis present

## 2024-04-04 DIAGNOSIS — I5043 Acute on chronic combined systolic (congestive) and diastolic (congestive) heart failure: Secondary | ICD-10-CM | POA: Diagnosis not present

## 2024-04-04 DIAGNOSIS — I482 Chronic atrial fibrillation, unspecified: Secondary | ICD-10-CM

## 2024-04-04 DIAGNOSIS — E66812 Obesity, class 2: Secondary | ICD-10-CM | POA: Diagnosis not present

## 2024-04-04 DIAGNOSIS — E785 Hyperlipidemia, unspecified: Secondary | ICD-10-CM | POA: Diagnosis not present

## 2024-04-04 DIAGNOSIS — E876 Hypokalemia: Secondary | ICD-10-CM | POA: Insufficient documentation

## 2024-04-04 DIAGNOSIS — I480 Paroxysmal atrial fibrillation: Secondary | ICD-10-CM | POA: Insufficient documentation

## 2024-04-04 DIAGNOSIS — I5033 Acute on chronic diastolic (congestive) heart failure: Principal | ICD-10-CM | POA: Diagnosis present

## 2024-04-04 DIAGNOSIS — D519 Vitamin B12 deficiency anemia, unspecified: Secondary | ICD-10-CM | POA: Diagnosis not present

## 2024-04-04 DIAGNOSIS — F419 Anxiety disorder, unspecified: Secondary | ICD-10-CM | POA: Insufficient documentation

## 2024-04-04 DIAGNOSIS — Z6835 Body mass index (BMI) 35.0-35.9, adult: Secondary | ICD-10-CM | POA: Insufficient documentation

## 2024-04-04 DIAGNOSIS — E559 Vitamin D deficiency, unspecified: Secondary | ICD-10-CM | POA: Insufficient documentation

## 2024-04-04 DIAGNOSIS — E0590 Thyrotoxicosis, unspecified without thyrotoxic crisis or storm: Secondary | ICD-10-CM | POA: Diagnosis not present

## 2024-04-04 DIAGNOSIS — Z79899 Other long term (current) drug therapy: Secondary | ICD-10-CM | POA: Insufficient documentation

## 2024-04-04 DIAGNOSIS — Z952 Presence of prosthetic heart valve: Secondary | ICD-10-CM | POA: Diagnosis not present

## 2024-04-04 DIAGNOSIS — I4891 Unspecified atrial fibrillation: Secondary | ICD-10-CM | POA: Diagnosis present

## 2024-04-04 HISTORY — DX: Unspecified systolic (congestive) heart failure: I50.20

## 2024-04-04 HISTORY — DX: Cerebral aneurysm, nonruptured: I67.1

## 2024-04-04 HISTORY — DX: Other cardiomyopathies: I42.8

## 2024-04-04 HISTORY — DX: Morbid (severe) obesity due to excess calories: E66.01

## 2024-04-04 HISTORY — DX: Chronic systolic (congestive) heart failure: I50.22

## 2024-04-04 HISTORY — DX: Tobacco use: Z72.0

## 2024-04-04 HISTORY — DX: Depression, unspecified: F32.A

## 2024-04-04 HISTORY — DX: Nonrheumatic mitral (valve) insufficiency: I34.0

## 2024-04-04 HISTORY — DX: Paroxysmal atrial fibrillation: I48.0

## 2024-04-04 LAB — CBC WITH DIFFERENTIAL/PLATELET
Abs Immature Granulocytes: 0.06 K/uL (ref 0.00–0.07)
Basophils Absolute: 0.1 K/uL (ref 0.0–0.1)
Basophils Relative: 1 %
Eosinophils Absolute: 0 K/uL (ref 0.0–0.5)
Eosinophils Relative: 0 %
HCT: 43.1 % (ref 36.0–46.0)
Hemoglobin: 13.6 g/dL (ref 12.0–15.0)
Immature Granulocytes: 1 %
Lymphocytes Relative: 22 %
Lymphs Abs: 2.2 K/uL (ref 0.7–4.0)
MCH: 27.7 pg (ref 26.0–34.0)
MCHC: 31.6 g/dL (ref 30.0–36.0)
MCV: 87.8 fL (ref 80.0–100.0)
Monocytes Absolute: 1 K/uL (ref 0.1–1.0)
Monocytes Relative: 10 %
Neutro Abs: 6.8 K/uL (ref 1.7–7.7)
Neutrophils Relative %: 66 %
Platelets: 264 K/uL (ref 150–400)
RBC: 4.91 MIL/uL (ref 3.87–5.11)
RDW: 17 % — ABNORMAL HIGH (ref 11.5–15.5)
WBC: 10.1 K/uL (ref 4.0–10.5)
nRBC: 0 % (ref 0.0–0.2)

## 2024-04-04 LAB — COMPREHENSIVE METABOLIC PANEL WITH GFR
ALT: 62 U/L — ABNORMAL HIGH (ref 0–44)
AST: 52 U/L — ABNORMAL HIGH (ref 15–41)
Albumin: 3.4 g/dL — ABNORMAL LOW (ref 3.5–5.0)
Alkaline Phosphatase: 68 U/L (ref 38–126)
Anion gap: 11 (ref 5–15)
BUN: 18 mg/dL (ref 6–20)
CO2: 26 mmol/L (ref 22–32)
Calcium: 8.8 mg/dL — ABNORMAL LOW (ref 8.9–10.3)
Chloride: 101 mmol/L (ref 98–111)
Creatinine, Ser: 1.08 mg/dL — ABNORMAL HIGH (ref 0.44–1.00)
GFR, Estimated: >60 mL/min (ref >60)
Glucose, Bld: 178 mg/dL — ABNORMAL HIGH (ref 70–99)
Potassium: 3.4 mmol/L — ABNORMAL LOW (ref 3.5–5.1)
Sodium: 138 mmol/L (ref 135–145)
Total Bilirubin: 0.6 mg/dL (ref 0.0–1.2)
Total Protein: 7.7 g/dL (ref 6.5–8.1)

## 2024-04-04 LAB — TROPONIN I (HIGH SENSITIVITY)
Troponin I (High Sensitivity): 11 ng/L (ref <18)
Troponin I (High Sensitivity): 11 ng/L (ref <18)

## 2024-04-04 LAB — TSH: TSH: 0.327 u[IU]/mL — ABNORMAL LOW (ref 0.350–4.500)

## 2024-04-04 LAB — PROTIME-INR
INR: 2.2 — ABNORMAL HIGH (ref 0.8–1.2)
Prothrombin Time: 25.7 s — ABNORMAL HIGH (ref 11.4–15.2)

## 2024-04-04 LAB — HCG, QUANTITATIVE, PREGNANCY: hCG, Beta Chain, Quant, S: 1 m[IU]/mL (ref <5)

## 2024-04-04 LAB — BRAIN NATRIURETIC PEPTIDE: B Natriuretic Peptide: 524.1 pg/mL — ABNORMAL HIGH (ref 0.0–100.0)

## 2024-04-04 LAB — MAGNESIUM: Magnesium: 2.2 mg/dL (ref 1.7–2.4)

## 2024-04-04 LAB — T4, FREE: Free T4: 1.13 ng/dL — ABNORMAL HIGH (ref 0.61–1.12)

## 2024-04-04 MED ORDER — ACETAMINOPHEN 650 MG RE SUPP: 650.0000 mg | Freq: Four times a day (QID) | RECTAL | Status: DC | PRN

## 2024-04-04 MED ORDER — POLYETHYLENE GLYCOL 3350 17 G PO PACK: 17.0000 g | PACK | Freq: Every day | ORAL | Status: DC | PRN

## 2024-04-04 MED ORDER — AMIODARONE LOAD VIA INFUSION
150.0000 mg | Freq: Once | INTRAVENOUS | Status: AC
  Administered 2024-04-04: 150 mg via INTRAVENOUS
  Filled 2024-04-04: qty 83.34

## 2024-04-04 MED ORDER — ACETAMINOPHEN 325 MG PO TABS: 650.0000 mg | ORAL_TABLET | Freq: Four times a day (QID) | ORAL | Status: DC | PRN

## 2024-04-04 MED ORDER — ONDANSETRON HCL 4 MG PO TABS: 4.0000 mg | ORAL_TABLET | Freq: Four times a day (QID) | ORAL | Status: DC | PRN

## 2024-04-04 MED ORDER — POTASSIUM CHLORIDE CRYS ER 20 MEQ PO TBCR
40.0000 meq | EXTENDED_RELEASE_TABLET | Freq: Once | ORAL | Status: AC
Start: 2024-04-04 — End: 2024-04-04
  Administered 2024-04-04: 40 meq via ORAL
  Filled 2024-04-04: qty 2

## 2024-04-04 MED ORDER — ONDANSETRON HCL 4 MG/2ML IJ SOLN: 4.0000 mg | Freq: Four times a day (QID) | INTRAMUSCULAR | Status: DC | PRN

## 2024-04-04 MED ORDER — FUROSEMIDE 10 MG/ML IJ SOLN
40.0000 mg | Freq: Two times a day (BID) | INTRAMUSCULAR | Status: DC
  Administered 2024-04-04 – 2024-04-05 (×3): 40 mg via INTRAVENOUS
  Filled 2024-04-04 (×3): qty 4

## 2024-04-04 MED ORDER — FENTANYL CITRATE PF 50 MCG/ML IJ SOSY
50.0000 ug | PREFILLED_SYRINGE | Freq: Once | INTRAMUSCULAR | Status: AC
  Administered 2024-04-04: 50 ug via INTRAVENOUS
  Filled 2024-04-04: qty 1

## 2024-04-04 MED ORDER — ROSUVASTATIN CALCIUM 10 MG PO TABS
40.0000 mg | ORAL_TABLET | Freq: Every day | ORAL | Status: DC
  Administered 2024-04-04 – 2024-04-05 (×2): 40 mg via ORAL
  Filled 2024-04-04: qty 2
  Filled 2024-04-04: qty 4

## 2024-04-04 MED ORDER — SPIRONOLACTONE 12.5 MG HALF TABLET
12.5000 mg | ORAL_TABLET | Freq: Every day | ORAL | Status: DC
  Administered 2024-04-04 – 2024-04-05 (×2): 12.5 mg via ORAL
  Filled 2024-04-04 (×2): qty 1

## 2024-04-04 MED ORDER — METOPROLOL TARTRATE 25 MG PO TABS
25.0000 mg | ORAL_TABLET | Freq: Once | ORAL | Status: AC
  Administered 2024-04-04: 25 mg via ORAL
  Filled 2024-04-04: qty 1

## 2024-04-04 MED ORDER — METOPROLOL TARTRATE 5 MG/5ML IV SOLN
5.0000 mg | Freq: Once | INTRAVENOUS | Status: AC
  Administered 2024-04-04: 5 mg via INTRAVENOUS
  Filled 2024-04-04: qty 5

## 2024-04-04 MED ORDER — OXYCODONE HCL 5 MG PO TABS
5.0000 mg | ORAL_TABLET | ORAL | Status: DC | PRN
  Administered 2024-04-04 – 2024-04-05 (×2): 5 mg via ORAL
  Filled 2024-04-04 (×2): qty 1

## 2024-04-04 MED ORDER — FENTANYL CITRATE PF 50 MCG/ML IJ SOSY
25.0000 ug | PREFILLED_SYRINGE | INTRAMUSCULAR | Status: DC | PRN
Start: 2024-04-04 — End: 2024-04-05
  Administered 2024-04-04: 25 ug via INTRAVENOUS
  Filled 2024-04-04: qty 1

## 2024-04-04 MED ORDER — SACUBITRIL-VALSARTAN 24-26 MG PO TABS
1.0000 | ORAL_TABLET | Freq: Two times a day (BID) | ORAL | Status: DC
  Administered 2024-04-04 – 2024-04-05 (×3): 1 via ORAL
  Filled 2024-04-04 (×4): qty 1

## 2024-04-04 MED ORDER — AMIODARONE HCL IN DEXTROSE 360-4.14 MG/200ML-% IV SOLN
30.0000 mg/h | INTRAVENOUS | Status: DC
  Administered 2024-04-04 – 2024-04-05 (×2): 30 mg/h via INTRAVENOUS
  Filled 2024-04-04: qty 200

## 2024-04-04 MED ORDER — AMIODARONE HCL IN DEXTROSE 360-4.14 MG/200ML-% IV SOLN
60.0000 mg/h | INTRAVENOUS | Status: AC
  Administered 2024-04-04 (×2): 60 mg/h via INTRAVENOUS
  Filled 2024-04-04 (×2): qty 200

## 2024-04-04 MED ORDER — CARVEDILOL 3.125 MG PO TABS
3.1250 mg | ORAL_TABLET | Freq: Two times a day (BID) | ORAL | Status: DC
  Administered 2024-04-04 – 2024-04-05 (×2): 3.125 mg via ORAL
  Filled 2024-04-04 (×2): qty 1

## 2024-04-04 MED ORDER — IOHEXOL 350 MG/ML SOLN
75.0000 mL | Freq: Once | INTRAVENOUS | Status: AC | PRN
  Administered 2024-04-04: 75 mL via INTRAVENOUS

## 2024-04-04 MED ORDER — BUTALBITAL-APAP-CAFFEINE 50-325-40 MG PO TABS
1.0000 | ORAL_TABLET | Freq: Once | ORAL | Status: AC
  Administered 2024-04-04: 1 via ORAL
  Filled 2024-04-04: qty 1

## 2024-04-04 NOTE — Consult Note (Signed)
 Cardiology Consult    Patient ID: KEAYRA GRAHAM MRN: [id number], DOB/AGE: 51-Jul-1972   Admit date: 04/04/2024 Date of Consult: 04/04/2024  Primary Physician: Sharma Coyer, MD Primary Cardiologist: Evalene Lunger, MD   Requesting Provider: GEANNIE Dade, MD  Patient Profile    Sandra Hines is a 53 y.o. female with a history of hypertension, hyperlipidemia, tobacco abuse, cerebral aneurysm (repaired at [facility] in 2019), paroxysmal atrial fibrillation on amio, severe MR s/p mitral valve replacement (prosthetic MVR status post MAZE with occlusion of LAA (10/07/2023), HFmrEF/NICM, mood disorder, arthritis, tobacco dependence, and obesity, who is being seen today for the evaluation of rapid atrial flutter at the request of Dr. Dade, MD.  Past Medical History   Subjective  Past Medical History:  Diagnosis Date   Cerebral aneurysm    a. 2019 s/p repair ([facility]).   Depression    Heart failure with mid-range ejection fraction (HCC)    a. 09/2023 Echo: EF 55-60%; b. 09/2023 Echo (post-op MVR): EF 25-30%; c. 12/2023 Echo: EF 40-45%, glob HK, nl RV fxn, nl fxn'ing MV prosthesis, Asc Ao 40mm.   Hypertension    Mitral regurgitation    a. 09/2023 Echo: Severe MR; b. 09/2023 s/p a 29 mm SJM Prosthesis, PVI MAZE w/ occlusion of the LAA with a 50 mm Medtronic clip; b. 12/2023 Echo: No MR.   Morbid obesity (HCC)    NICM (nonischemic cardiomyopathy) (HCC)    a. 09/2023 Echo: EF 55-60%, severe MR; b. 09/2023 Cath: Nl cors; c. 09/2023 Echo (post-op MVR): EF 25-30%; d. 12/2023 Echo: EF 40-45%, glob HK, nl RV fxn, nl fxn'ing MV prosthesis, Asc Ao 40mm.   PAF (paroxysmal atrial fibrillation) (HCC)    a. Dx 09/2023.  CHA2DS2VASc = 3-->warfarin; b. On amio; c. 10/2023 DCCV (150J).   Tobacco abuse     Past Surgical History:  Procedure Laterality Date   CARDIOVERSION N/A 11/01/2023   Procedure: CARDIOVERSION;  Surgeon: Cherrie Toribio SAUNDERS, MD;  Location: [facility] ORS;  Service: Cardiovascular;  Laterality: N/A;   CLIPPING  OF ATRIAL APPENDAGE  10/07/2023   Procedure: CLIPPING, LEFT ATRIAL APPENDAGE USING MEDTRONIC PENDITURE LAA EXCLUSION SYSTEM SIZE ;  Surgeon: Maryjane Mt, MD;  Location: [facility] OR;  Service: Open Heart Surgery;;   INTRAOPERATIVE TRANSESOPHAGEAL ECHOCARDIOGRAM N/A 10/07/2023   Procedure: ECHOCARDIOGRAM, TRANSESOPHAGEAL, INTRAOPERATIVE;  Surgeon: Maryjane Mt, MD;  Location: [facility] OR;  Service: Open Heart Surgery;  Laterality: N/A;   MAZE N/A 10/07/2023   Procedure: MAZE PROCEDURE;  Surgeon: Maryjane Mt, MD;  Location: [facility] OR;  Service: Open Heart Surgery;  Laterality: N/A;   MITRAL VALVE REPLACEMENT N/A 10/07/2023   Procedure: MITRAL VALVE REPLACEMENT USING SJM MASTERS SERIES MECHANICAL MITRAL HEART VALVE SIZE ;  Surgeon: Maryjane Mt, MD;  Location: [facility] OR;  Service: Open Heart Surgery;  Laterality: N/A;   RIGHT/LEFT HEART CATH AND CORONARY ANGIOGRAPHY N/A 10/03/2023   Procedure: RIGHT/LEFT HEART CATH AND CORONARY ANGIOGRAPHY;  Surgeon: Mady Bruckner, MD;  Location: [facility] INVASIVE CV LAB;  Service: Cardiovascular;  Laterality: N/A;   TEE WITHOUT CARDIOVERSION N/A 10/02/2023   Procedure: ECHOCARDIOGRAM, TRANSESOPHAGEAL;  Surgeon: Lunger Evalene PARAS, MD;  Location: [facility] ORS;  Service: Cardiovascular;  Laterality: N/A;     Allergies  Allergies  Allergen Reactions   Penicillins Hives            History of Present Illness   53 y.o. female with a past medical history of hypertension, hyperlipidemia, tobacco abuse, cerebral aneurysm (repaired at [facility] in 2019), paroxysmal atrial fibrillation  on amio, severe MR s/p mitral valve replacement (prosthetic MVR status post MAZE with occlusion of LAA (10/07/2023), HFmrEF/NICM, mood disorder, arthritis, tobacco dependence, and obesity.    In early March 2025, she was admitted to [facility] w/ Afib RVR, HTN, CHF, and resp failure/? PNA.  Echo showed EF 45-50% w/ severe MR.  Cath showed nl cors w/ elevated filling pressures, requiring additional diuresis.  She was  tx to [facility] and seen by CT surgery in [place], and underwent mechanical MVR, PVI/Maze, and LAA occlusion.  Due to post-op Afib, amio was started w/ conversion to sinus w/ periodic bradycardia.   She was readmitted 10/19/2023 with recurrent heart failure and new worsening of LV fxn, w/ echo showing EF of 25-30%, w/ trivial MR and mild-mod AI.  She had clinical improvement w/ diuresis and maintained sinus rhythm on amio, and was d/c on 10/22/2023.  Unfortunately, she had recurrent afib in the outpt setting and req DCCV (150J) on 11/01/2023.  F/u echo 12/2023 showed some improvement in EF to 40-45% w/ global HK, nl RV fxn, nl fxn'ing MV prosthesis, and asc Ao of 4.0 cm.  At 02/19/2024 office visit, Ms. Rausch was clinically stable.  She notes that since her surgery, she has had intermittent burning and itching of her sternal scar.  Further, at the distal end of the scar, the area is tender.  Over the past several months, she has had intermittent sharp pain over her lower sternal, which is often worse with palpation, deep breathing, and when swallowing food or water.  This now occurs most days of the week and can last for half a day.  There are no associated symptoms such as dyspnea or palpitations, though the sharp pain does radiate into the center of her back.  When symptoms occur, she usually just lays down for most of the day, though notes that she is frequently restless during the symptoms.  Symptoms do not appear to change with ambulation/exertion.  She weighs herself daily and says her weight has been stable.  Due to a change in her Medicaid, she has only been taking amiodarone , warfarin, torsemide , and rosuvastatin .  She ran out of carvedilol , spironolactone , Entresto , and Jardiance  and has yet to have these refilled because she was told her Jardiance  co-pay would be over $600.  On the evening of September 12th, Ms. Suares had recurrence of sharp chest pain, which was more or less constant throughout the night and  present when she awoke this morning.  Upon awakening this morning, she also noted that she could hear her mechanical mitral valve clicking faster and more irregularly and she presumed that she was in atrial fibrillation.  Due to persistent chest pain, she presented to the ED this morning.  On presentation, she was afebrile, hypertensive (148/121), and tachycardic @ 138.  12 lead ECG showed Atrial flutter, 139, LVH, no acute ST/T changes.  Labs notable for normal troponin, K of 3.4, creat 1.08 (@ baseline), mild LFT elevations - AST 52, ALT 62, BNP 524.1, H/H 13.6/43.1, INR 2.2 (3.0 on 02/19/2024, 1.3 on 01/15/2024), TSH low @ 0.327, FT4 mildly elev @ 1.13.  CXR w/ mild pulm vascular congestion.  CTA chest neg for PE or other acute or inflammatory process.  She was treated with oral beta-blocker with some improvement in rate though it has since returned to the 130s.  She denies palpitations at this time.  She does note that since last night she has been a little more short of breath.  She continues to have moderate lower sternal pain and tenderness.  Inpatient Medications   Subjective    amiodarone   150 mg Intravenous Once   butalbital -acetaminophen -caffeine   1 tablet Oral Once   carvedilol   3.125 mg Oral BID WC   furosemide   40 mg Intravenous BID   metoprolol  tartrate  5 mg Intravenous Once   potassium chloride   40 mEq Oral Once   rosuvastatin   40 mg Oral Daily   sacubitril -valsartan   1 tablet Oral BID   spironolactone   12.5 mg Oral Daily  Metoprolol  tartrate 25 mg x 1  Family History    History reviewed. No pertinent family history. She indicated that her mother is alive. She indicated that her father is deceased. No premature CAD.  Social History    Social History   Socioeconomic History   Marital status: Single    Spouse name: Not on file   Number of children: 8   Years of education: Not on file   Highest education level: Some college, no degree  Occupational History   Not on file   Tobacco Use   Smoking status: Some Days    Current packs/day: 1.00    Types: Cigarettes   Smokeless tobacco: Never   Tobacco comments:    Was smoking regularly prior to surgery in March but since then, has smoked about 1 pack of cigarettes.  Vaping Use   Vaping status: Never Used  Substance and Sexual Activity   Alcohol use: Yes    Alcohol/week: 2.0 standard drinks of alcohol    Types: 2 Shots of liquor per week    Comment: A few shots of liquor on the weekends.   Drug use: Yes    Types: Marijuana    Comment: Occasionally smokes marijuana when anxiety is overwhelming.   Sexual activity: Yes    Birth control/protection: None, Condom  Other Topics Concern   Not on file  Social History Narrative   Lives locally with children.  Has 8 children ranging in age from 54-30.  Does not routine exercise.   Social Drivers of Health   Financial Resource Strain: Medium Risk (09/30/2023)   Overall Financial Resource Strain (CARDIA)    Difficulty of Paying Living Expenses: Somewhat hard  Food Insecurity: No Food Insecurity (11/28/2023)   Hunger Vital Sign    Worried About Running Out of Food in the Last Year: Never true    Ran Out of Food in the Last Year: Never true  Transportation Needs: No Transportation Needs (11/28/2023)   PRAPARE - Administrator, Civil Service (Medical): No    Lack of Transportation (Non-Medical): No  Physical Activity: Not on file  Stress: Not on file  Social Connections: Moderately Integrated (10/20/2023)   Social Connection and Isolation Panel    Frequency of Communication with Friends and Family: Three times a week    Frequency of Social Gatherings with Friends and Family: Once a week    Attends Religious Services: 1 to 4 times per year    Active Member of [employer] or Organizations: Yes    Attends Banker Meetings: 1 to 4 times per year    Marital Status: Never married  Intimate Partner Violence: Not At Risk (11/28/2023)   Humiliation, Afraid,  Rape, and Kick questionnaire    Fear of Current or Ex-Partner: No    Emotionally Abused: No    Physically Abused: No    Sexually Abused: No     Review of Systems    General:  No chills, fever, night sweats or weight changes.  Cardiovascular:  +++ chest pain and tenderness along her sternal scar, +++ dyspnea on exertion, no edema, orthopnea, palpitations, paroxysmal nocturnal dyspnea. Dermatological: No rash, lesions/masses Respiratory: +++ cough over the past few days, +++ dyspnea since last night Urologic: No hematuria, dysuria Abdominal:   No nausea, vomiting, diarrhea, bright red blood per rectum, melena, or hematemesis Neurologic:  No visual changes, wkns, changes in mental status. Psych: Admits to depressed mood with little energy and says she cries a lot. All other systems reviewed and are otherwise negative except as noted above.     Objective   Physical Exam    Blood pressure (!) 150/119, pulse (!) 125, temperature 98.4 F (36.9 C), temperature source Oral, resp. rate 16, height 5' 7 (1.702 m), weight 99.8 kg, SpO2 94%.  General: Pleasant, NAD Psych: Normal affect. Neuro: Alert and oriented X 3. Moves all extremities spontaneously. HEENT: Normal  Neck: Supple without bruits.  Moderately elevated JVD. Lungs:  Resp regular and unlabored, bibasilar crackles. Heart: Regularly irregular, tachycardic, mechanical S1, no s3, s4, or murmurs. Abdomen: Soft, non-tender, non-distended, BS + x 4.  Extremities: No clubbing, cyanosis or edema. DP/PT2+, Radials 2+ and equal bilaterally.  Labs    Cardiac Enzymes Recent Labs  Lab 04/04/24 0734 04/04/24 1002  TROPONINIHS 11 11     BNP    Component Value Date/Time   BNP 524.1 (H) 04/04/2024 0734    Lab Results  Component Value Date   WBC 10.1 04/04/2024   HGB 13.6 04/04/2024   HCT 43.1 04/04/2024   MCV 87.8 04/04/2024   PLT 264 04/04/2024    Recent Labs  Lab 04/04/24 0734  NA 138  K 3.4*  CL 101  CO2 26  BUN  18  CREATININE 1.08*  CALCIUM  8.8*  PROT 7.7  BILITOT 0.6  ALKPHOS 68  ALT 62*  AST 52*  GLUCOSE 178*   Lab Results  Component Value Date   CHOL 175 09/26/2023   HDL 41 09/26/2023   LDLCALC 118 (H) 09/26/2023   TRIG 79 09/26/2023   Lab Results  Component Value Date   INR 2.2 (H) 04/04/2024   INR 3.0 02/19/2024   INR 1.3 (A) 01/15/2024       Radiology Studies    CT Angio Chest PE W and/or Wo Contrast Result Date: 04/04/2024 CLINICAL DATA:  53 year old female with intermittent chest pain. Shortness of breath. Nausea. Prior cardiac surgery. EXAM: CT ANGIOGRAPHY CHEST WITH CONTRAST TECHNIQUE: Multidetector CT imaging of the chest was performed using the standard protocol during bolus administration of intravenous contrast. Multiplanar CT image reconstructions and MIPs were obtained to evaluate the vascular anatomy. RADIATION DOSE REDUCTION: This exam was performed according to the departmental dose-optimization program which includes automated exposure control, adjustment of the mA and/or kV according to patient size and/or use of iterative reconstruction technique. CONTRAST:  75mL OMNIPAQUE  IOHEXOL  350 MG/ML SOLN COMPARISON:  CTA chest 09/26/2023. Portable chest x-ray this morning. FINDINGS: Cardiovascular: Excellent contrast bolus timing in the pulmonary arterial tree. Mild central pulmonary artery enlargement. No pulmonary artery filling defect. Interval sternotomy and mitral valve replacement. No left heart or aorta contrast. Calcified aortic atherosclerosis. No pericardial effusion. Mediastinum/Nodes: Postoperative and chronic post granulomatous changes in the mediastinum. Chronic small calcified mediastinal and hilar lymph nodes. Lungs/Pleura: Major airways are patent. Widely scattered chronic calcified lung granulomas (no follow-up imaging recommended). No pleural effusion. No consolidation. Mild atelectasis. No convincing active lung inflammation. Upper Abdomen: Chronic  calcified  granulomas in the spleen. Negative visible noncontrast liver, gallbladder, pancreas, adrenal glands, left kidney, bowel in the upper abdomen. Musculoskeletal: Sternotomy. Lower thoracic bulky anterior endplate degeneration, new T8-T9 interbody ankylosis results. No acute osseous abnormality identified. Review of the MIP images confirms the above findings. IMPRESSION: 1. Negative for acute pulmonary embolus. 2. No acute or inflammatory process identified in the chest. Prior cardiac/cardiac valve surgery. Chronic post granulomatous changes. 3. Aortic Atherosclerosis (ICD10-I70.0). Electronically Signed   By: VEAR Hurst M.D.   On: 04/04/2024 09:21   DG Chest Portable 1 View Result Date: 04/04/2024 CLINICAL DATA:  Chest pain EXAM: PORTABLE CHEST 1 VIEW COMPARISON:  Prior chest x-ray 11/07/2023 FINDINGS: Patient is status post median sternotomy with evidence of prior mitral valve replacement and left atrial appendage ligation. Mild cardiomegaly and pulmonary vascular congestion without overt pulmonary edema. Patchy bibasilar airspace opacities favored to reflect atelectasis. Calcified granulomas again noted overlying the right lower lobe. No large effusion. No pneumothorax. No acute osseous abnormality. IMPRESSION: Cardiomegaly and mild pulmonary vascular congestion without overt pulmonary edema. Probable mild bibasilar atelectasis. Electronically Signed   By: Wilkie Lent M.D.   On: 04/04/2024 08:10      ECG & Cardiac Imaging    Atrial flutter, 139, LVH, no acute ST/T changes - personally reviewed.  Assessment & Plan    1.  Atrial flutter with rapid ventricular response: Patient with a history of atrial fibrillation in the setting mitral valve disease.  She did have paroxysmal atrial fibrillation following her mitral valve surgery in March and required cardioversion in April 2025.  She has been compliant with amiodarone  200 mg twice daily, and also notes compliance with warfarin.  She started having sharp  chest pain last night, which has been occurring frequently since her surgery in March, and this morning, in the setting of ongoing chest pain, she also noted elevated heart rate with irregularity.  She denies palpitations but felt she was likely back in A-fib because she could hear her mechanical valve clicking quickly.  ECG here showed a flutter at 139.  She did slow some with oral metoprolol .  Rates currently in the 130s and she is more or less asymptomatic though she has had some more dyspnea and has mild volume overload on exam.  I am going to load her with amiodarone  and start an infusion and also resume carvedilol , which she recently stopped taking at home after she ran out.  Continue warfarin (INR 2.2).  Pending response to intravenous amiodarone , will need to consider TEE and cardioversion on Monday (hold Jardiance ).  2.  Precordial pain: Patient has been having precordial and sternal/incisional pain ever since her surgery.  This has been occurring more frequently, several times per week, worse with deep breathing, swallowing, and palpation, lasting most of the day, and resolving spontaneously.  She has been having ongoing discomfort and tenderness since last night, which prompted her presentation this morning.  Her troponins are normal.  Previous catheterization showed normal coronary arteries in March 2025.  Suspect pain is musculoskeletal in the setting of prior surgery.  No further ischemic evaluation is warranted at this time.  Pain management per medicine team.  3.  Acute on chronic heart failure with midrange ejection fraction/nonischemic cardiomyopathy: Patient had postoperative drop in EF to 25 to 30% following mitral valve surgery in March 2025.  Most recent echo showed slight improvement to 40-45% in June 2025.  She notes that her weight has been stable at home however she recently  has been noncompliant with carvedilol , spironolactone , Jardiance , and Entresto , after running out and being told  that her co-pays would change related to changes in her Medicaid coverage.  In the setting of rapid atrial flutter, her BNP is elevated at 424.1.  She has crackles on examination, vascular congestion on chest x-ray, and mildly elevated JVP.  She says she has been short of breath since last night despite compliance with torsemide  in the outpatient setting.  I am going to add Lasix  40 mg IV twice daily, first dose now.  Also resuming carvedilol , Entresto , and spironolactone .  Hold Jardiance  pending potential need for anesthesia/cardioversion.  4.  Primary hypertension: Blood pressure elevated on presentation in the setting of noncompliance with home medications.  Resuming carvedilol , Entresto , spironolactone .  Diuresing as outlined above.  Follow.  5.  Hyperlipidemia: Resume statin.  6.  Status post mechanical mitral valve: Severe MR noted in March 2025, status post mechanical mitral valve placement in March 2025.  Normal device functioning by echo in June 2025.  She reports compliance with warfarin with subtherapeutic INR of 2.2 this morning.  Will ask pharmacy to manage warfarin.  7.  Depression: Patient notes significant depression following her surgery stating that she has little energy at home and often cries.  She has been off of her medications for depression.  Defer management to medicine team.    8.  Hyperthyroidism/multinodular goiter: Previously noted to have mild TSH suppression and mild elevation of free T4 in December 2024 with ultrasound in January 2025 showing multinodular goiter.  TSH and free T4 remain mildly abnormal but do not appear to be significantly changed despite amiodarone  therapy.  Suspect ongoing hyperthyroidism is playing a role in recurrence of atrial arrhythmias.  Management per internal medicine.  Consider endocrine evaluation.  Risk Assessment/Risk Scores:     TIMI Risk Score for Unstable Angina or Non-ST Elevation MI:   The patient's TIMI risk score is 1, which  indicates a 5% risk of all cause mortality, new or recurrent myocardial infarction or need for urgent revascularization in the next 14 days.  New York  Heart Association (NYHA) Functional Class NYHA Class II  CHA2DS2-VASc Score = 3   This indicates a 3.2% annual risk of stroke. The patient's score is based upon: CHF History: 1 HTN History: 1 Diabetes History: 0 Stroke History: 0 Vascular Disease History: 0 Age Score: 0 Gender Score: 1     Signed, Lonni Meager, NP 04/04/2024, 11:54 AM  For questions or updates, please contact   Please consult [email or web address] for contact info under Cardiology/STEMI.

## 2024-04-04 NOTE — H&P (Signed)
 History and Physical    Sandra Hines FMW:[id number] DOB: 1971-01-28 DOA: 04/04/2024  DOS: the patient was seen and examined on 04/04/2024  PCP: Sharma Coyer, MD   Patient coming from: Home  I have personally briefly reviewed patient's old medical records in [facility]  Chief Complaint: Chest pain and shortness of breath  HPI: Sandra Hines is a pleasant 52 y.o. female with medical history significant for mitral regurgitation s/p mitral valve replacement in March 2025, A-fib on warfarin, CHF, HTN who presented to ED complaining of chest pain and shortness of breath.  Patient reports that for the past few days she has felt tightness in her chest with sharp pains with deep breathing.  Patient stated that her pain was 7/10 in intensity, sharp, nonradiating, worsening associate with exertion.  Patient stated that she was taking medications but she also told me that due to her insurance issues she was not able to take all the cardiac medications for few weeks. Patient admitted for mitral valve replacement, congestive heart failure and cardiac catheterization in March 2025 Patient denies any fever, chills, cough, palpitations, leg swelling.  ED Course: Upon arrival to the ED, patient is found to be in A-fib with RVR, blood pressure 148/121, heart rate 138, EKG showed narrow complex tachycardia, chest x-ray without any consolidation, CTA chest without PE.  Cardiology seen the patient and advised that do not use Cardizem  for atrial fibrillation rather resume her supposed to be home medication amiodarone  and Coreg .  Hospitalist service was consulted for evaluation.  Review of Systems:  ROS  All other systems negative except as noted in the HPI.  Past Medical History:  Diagnosis Date   Cerebral aneurysm    a. 2019 s/p repair ([facility]).   Depression    Heart failure with mid-range ejection fraction (HCC)    a. 09/2023 Echo: EF 55-60%; b. 09/2023 Echo (post-op MVR): EF 25-30%; c.  12/2023 Echo: EF 40-45%, glob HK, nl RV fxn, nl fxn'ing MV prosthesis, Asc Ao 40mm.   Hypertension    Mitral regurgitation    a. 09/2023 Echo: Severe MR; b. 09/2023 s/p a 29 mm SJM Prosthesis, PVI MAZE w/ occlusion of the LAA with a 50 mm Medtronic clip; b. 12/2023 Echo: No MR.   Morbid obesity (HCC)    NICM (nonischemic cardiomyopathy) (HCC)    a. 09/2023 Echo: EF 55-60%, severe MR; b. 09/2023 Cath: Nl cors; c. 09/2023 Echo (post-op MVR): EF 25-30%; d. 12/2023 Echo: EF 40-45%, glob HK, nl RV fxn, nl fxn'ing MV prosthesis, Asc Ao 40mm.   PAF (paroxysmal atrial fibrillation) (HCC)    a. Dx 09/2023.  CHA2DS2VASc = 3-->warfarin; b. On amio; c. 10/2023 DCCV (150J).   Tobacco abuse     Past Surgical History:  Procedure Laterality Date   CARDIOVERSION N/A 11/01/2023   Procedure: CARDIOVERSION;  Surgeon: Cherrie Toribio SAUNDERS, MD;  Location: [facility] ORS;  Service: Cardiovascular;  Laterality: N/A;   CLIPPING OF ATRIAL APPENDAGE  10/07/2023   Procedure: CLIPPING, LEFT ATRIAL APPENDAGE USING MEDTRONIC PENDITURE LAA EXCLUSION SYSTEM SIZE ;  Surgeon: Maryjane Mt, MD;  Location: [facility] OR;  Service: Open Heart Surgery;;   INTRAOPERATIVE TRANSESOPHAGEAL ECHOCARDIOGRAM N/A 10/07/2023   Procedure: ECHOCARDIOGRAM, TRANSESOPHAGEAL, INTRAOPERATIVE;  Surgeon: Maryjane Mt, MD;  Location: [facility] OR;  Service: Open Heart Surgery;  Laterality: N/A;   MAZE N/A 10/07/2023   Procedure: MAZE PROCEDURE;  Surgeon: Maryjane Mt, MD;  Location: [facility] OR;  Service: Open Heart Surgery;  Laterality: N/A;   MITRAL  VALVE REPLACEMENT N/A 10/07/2023   Procedure: MITRAL VALVE REPLACEMENT USING SJM MASTERS SERIES MECHANICAL MITRAL HEART VALVE SIZE ;  Surgeon: Maryjane Mt, MD;  Location: [facility] OR;  Service: Open Heart Surgery;  Laterality: N/A;   RIGHT/LEFT HEART CATH AND CORONARY ANGIOGRAPHY N/A 10/03/2023   Procedure: RIGHT/LEFT HEART CATH AND CORONARY ANGIOGRAPHY;  Surgeon: Mady Bruckner, MD;  Location: [facility] INVASIVE CV LAB;  Service:  Cardiovascular;  Laterality: N/A;   TEE WITHOUT CARDIOVERSION N/A 10/02/2023   Procedure: ECHOCARDIOGRAM, TRANSESOPHAGEAL;  Surgeon: Perla Evalene PARAS, MD;  Location: [facility] ORS;  Service: Cardiovascular;  Laterality: N/A;     reports that she has been smoking cigarettes. She has never used smokeless tobacco. She reports current alcohol use of about 2.0 standard drinks of alcohol per week. She reports current drug use. Drug: Marijuana.  Allergies  Allergen Reactions   Penicillins Hives         History reviewed. No pertinent family history.  Prior to Admission medications   Medication Sig Start Date End Date Taking? Authorizing Provider  acetaminophen  (TYLENOL ) 325 MG tablet Take 2 tablets (650 mg total) by mouth every 4 (four) hours as needed for headache or mild pain (pain score 1-3). 10/03/23   Dorinda Drue DASEN, MD  ALPRAZolam  (XANAX ) 0.25 MG tablet Take 1 tablet (0.25 mg total) by mouth at bedtime as needed for anxiety. 03/11/24   Simmons-Robinson, Rockie, MD  amiodarone  (PACERONE ) 200 MG tablet Take 1 tablet (200 mg total) by mouth 2 (two) times daily. 10/29/23   Bensimhon, Toribio SAUNDERS, MD  amiodarone  (PACERONE ) 200 MG tablet Take 1 tablet (200 mg total) by mouth 2 (two) times daily. 02/21/24   Bensimhon, Toribio SAUNDERS, MD  aspirin  81 MG chewable tablet Chew 1 tablet (81 mg total) by mouth daily. 10/14/23   Gold, Wayne E, PA-C  busPIRone  (BUSPAR ) 7.5 MG tablet Take 1 tablet (7.5 mg total) by mouth 2 (two) times daily. 10/30/23   Simmons-Robinson, Rockie, MD  busPIRone  (BUSPAR ) 7.5 MG tablet Take 1 tablet (7.5 mg total) by mouth 2 (two) times daily. 02/21/24   Simmons-Robinson, Rockie, MD  carvedilol  (COREG ) 3.125 MG tablet Take 1 tablet (3.125 mg total) by mouth 2 (two) times daily with a meal. 10/22/23 10/21/24  Von Bellis, MD  cyclobenzaprine  (FLEXERIL ) 5 MG tablet Take 1 tablet (5 mg total) by mouth 3 (three) times daily as needed for muscle spasms. 01/21/24   Clifton, Kellie A, FNP  DULoxetine  (CYMBALTA )  20 MG capsule Take 2 capsules (40 mg total) by mouth daily. 08/15/23   Simmons-Robinson, Rockie, MD  DULoxetine  (CYMBALTA ) 20 MG capsule Take 2 capsules (40 mg total) by mouth daily. 09/06/23   Simmons-Robinson, Rockie, MD  empagliflozin  (JARDIANCE ) 10 MG TABS tablet Take 1 tablet (10 mg total) by mouth daily. 10/23/23 10/22/24  Von Bellis, MD  Fe Fum-Vit C-Vit B12-FA (TRIGELS-F FORTE) CAPS capsule Take 1 capsule by mouth daily. 10/22/23 04/19/24  Von Bellis, MD  ondansetron  (ZOFRAN ) 4 MG tablet Take 1 tablet (4 mg total) by mouth every 8 (eight) hours as needed for nausea or vomiting. 10/30/23   Simmons-Robinson, Rockie, MD  oxyCODONE  (OXY IR/ROXICODONE ) 5 MG immediate release tablet Take 5 mg by mouth 3 (three) times daily as needed for severe pain (pain score 7-10).    [provider]  polyethylene glycol powder (GLYCOLAX /MIRALAX ) 17 GM/SCOOP powder Take 17 g by mouth daily. Mix as directed. 10/23/23   Von Bellis, MD  rosuvastatin  (CRESTOR ) 40 MG tablet Take 1 tablet (40 mg total)  by mouth daily. 01/14/24   Simmons-Robinson, Rockie, MD  sacubitril -valsartan  (ENTRESTO ) 24-26 MG Take 1 tablet by mouth 2 (two) times daily. 10/22/23 10/21/24  Von Bellis, MD  spironolactone  (ALDACTONE ) 25 MG tablet Take 0.5 tablets (12.5 mg total) by mouth daily. 02/19/24 02/18/25  Gerard Frederick, NP  torsemide  (DEMADEX ) 20 MG tablet Take 1 tablet (20 mg total) by mouth daily. 10/23/23 10/22/24  Von Bellis, MD  triamcinolone  (KENALOG ) 0.025 % ointment Apply 1 Application topically 2 (two) times daily. 02/12/24   Wellington Curtis LABOR, FNP  warfarin (COUMADIN ) 2.5 MG tablet Take 2 tablets by mouth daily or as directed by coumadin  clinic 02/21/24   Bensimhon, Toribio SAUNDERS, MD    Physical Exam: Vitals:   04/04/24 0930 04/04/24 1030 04/04/24 1230 04/04/24 1241  BP: (!) 140/107 (!) 150/119 (!) 139/126   Pulse: (!) 123 (!) 125 (!) 126   Resp: 18 16 (!) 24 18  Temp:      TempSrc:      SpO2: 95% 94% 100%   Weight:       Height:        Physical Exam   Constitutional: Alert, awake, calm, comfortable HEENT: Neck supple Respiratory: Clear to auscultation B/L, no wheezing, no rales.  Cardiovascular: Regular rate and rhythm, no murmurs / rubs / gallops. No extremity edema. 2+ pedal pulses. No carotid bruits.  Bilateral pedal edema 2+ Abdomen: Soft, no tenderness, Bowel sounds positive.  Musculoskeletal: no clubbing / cyanosis. Good ROM, no contractures. Normal muscle tone.  Skin: no rashes, lesions, ulcers. Neurologic: CN 2-12 grossly intact. Sensation intact, No focal deficit identified Psychiatric: Alert and oriented x 3. Normal mood.    Labs on Admission: I have personally reviewed following labs and imaging studies  CBC: Recent Labs  Lab 04/04/24 0734  WBC 10.1  NEUTROABS 6.8  HGB 13.6  HCT 43.1  MCV 87.8  PLT 264   Basic Metabolic Panel: Recent Labs  Lab 04/04/24 0734  NA 138  K 3.4*  CL 101  CO2 26  GLUCOSE 178*  BUN 18  CREATININE 1.08*  CALCIUM  8.8*  MG 2.2   GFR: Estimated Creatinine Clearance: 75.7 mL/min (A) (by C-G formula based on SCr of 1.08 mg/dL (H)). Liver Function Tests: Recent Labs  Lab 04/04/24 0734  AST 52*  ALT 62*  ALKPHOS 68  BILITOT 0.6  PROT 7.7  ALBUMIN  3.4*   No results for input(s): LIPASE, AMYLASE in the last 168 hours. No results for input(s): AMMONIA in the last 168 hours. Coagulation Profile: Recent Labs  Lab 04/04/24 0734  INR 2.2*   Cardiac Enzymes: Recent Labs  Lab 04/04/24 0734 04/04/24 1002  TROPONINIHS 11 11   BNP (last 3 results) Recent Labs    09/25/23 1711 10/19/23 0741 04/04/24 0734  BNP 700.0* 1,629.6* 524.1*   HbA1C: No results for input(s): HGBA1C in the last 72 hours. CBG: No results for input(s): GLUCAP in the last 168 hours. Lipid Profile: No results for input(s): CHOL, HDL, LDLCALC, TRIG, CHOLHDL, LDLDIRECT in the last 72 hours. Thyroid  Function Tests: Recent Labs     04/04/24 0734  TSH 0.327*  FREET4 1.13*   Anemia Panel: No results for input(s): VITAMINB12, FOLATE, FERRITIN, TIBC, IRON , RETICCTPCT in the last 72 hours. Urine analysis:    Component Value Date/Time   COLORURINE YELLOW (A) 11/26/2020 0041   APPEARANCEUR CLEAR (A) 11/26/2020 0041   APPEARANCEUR Cloudy 08/15/2014 0519   LABSPEC 1.026 11/26/2020 0041   LABSPEC 1.017 08/15/2014 9480  PHURINE 5.0 11/26/2020 0041   GLUCOSEU NEGATIVE 11/26/2020 0041   GLUCOSEU Negative 08/15/2014 0519   HGBUR NEGATIVE 11/26/2020 0041   BILIRUBINUR NEGATIVE 11/26/2020 0041   BILIRUBINUR Negative 08/15/2014 0519   KETONESUR NEGATIVE 11/26/2020 0041   PROTEINUR NEGATIVE 11/26/2020 0041   NITRITE NEGATIVE 11/26/2020 0041   LEUKOCYTESUR NEGATIVE 11/26/2020 0041   LEUKOCYTESUR Negative 08/15/2014 0519    Radiological Exams on Admission: I have personally reviewed images CT Angio Chest PE W and/or Wo Contrast Result Date: 04/04/2024 CLINICAL DATA:  53 year old female with intermittent chest pain. Shortness of breath. Nausea. Prior cardiac surgery. EXAM: CT ANGIOGRAPHY CHEST WITH CONTRAST TECHNIQUE: Multidetector CT imaging of the chest was performed using the standard protocol during bolus administration of intravenous contrast. Multiplanar CT image reconstructions and MIPs were obtained to evaluate the vascular anatomy. RADIATION DOSE REDUCTION: This exam was performed according to the departmental dose-optimization program which includes automated exposure control, adjustment of the mA and/or kV according to patient size and/or use of iterative reconstruction technique. CONTRAST:  75mL OMNIPAQUE  IOHEXOL  350 MG/ML SOLN COMPARISON:  CTA chest 09/26/2023. Portable chest x-ray this morning. FINDINGS: Cardiovascular: Excellent contrast bolus timing in the pulmonary arterial tree. Mild central pulmonary artery enlargement. No pulmonary artery filling defect. Interval sternotomy and mitral valve  replacement. No left heart or aorta contrast. Calcified aortic atherosclerosis. No pericardial effusion. Mediastinum/Nodes: Postoperative and chronic post granulomatous changes in the mediastinum. Chronic small calcified mediastinal and hilar lymph nodes. Lungs/Pleura: Major airways are patent. Widely scattered chronic calcified lung granulomas (no follow-up imaging recommended). No pleural effusion. No consolidation. Mild atelectasis. No convincing active lung inflammation. Upper Abdomen: Chronic calcified granulomas in the spleen. Negative visible noncontrast liver, gallbladder, pancreas, adrenal glands, left kidney, bowel in the upper abdomen. Musculoskeletal: Sternotomy. Lower thoracic bulky anterior endplate degeneration, new T8-T9 interbody ankylosis results. No acute osseous abnormality identified. Review of the MIP images confirms the above findings. IMPRESSION: 1. Negative for acute pulmonary embolus. 2. No acute or inflammatory process identified in the chest. Prior cardiac/cardiac valve surgery. Chronic post granulomatous changes. 3. Aortic Atherosclerosis (ICD10-I70.0). Electronically Signed   By: VEAR Hurst M.D.   On: 04/04/2024 09:21   DG Chest Portable 1 View Result Date: 04/04/2024 CLINICAL DATA:  Chest pain EXAM: PORTABLE CHEST 1 VIEW COMPARISON:  Prior chest x-ray 11/07/2023 FINDINGS: Patient is status post median sternotomy with evidence of prior mitral valve replacement and left atrial appendage ligation. Mild cardiomegaly and pulmonary vascular congestion without overt pulmonary edema. Patchy bibasilar airspace opacities favored to reflect atelectasis. Calcified granulomas again noted overlying the right lower lobe. No large effusion. No pneumothorax. No acute osseous abnormality. IMPRESSION: Cardiomegaly and mild pulmonary vascular congestion without overt pulmonary edema. Probable mild bibasilar atelectasis. Electronically Signed   By: Wilkie Lent M.D.   On: 04/04/2024 08:10    EKG:  My personal interpretation of EKG shows: Sinus tachycardia    Assessment/Plan Principal Problem:   A-fib (HCC) Active Problems:   Acute on chronic heart failure with preserved ejection fraction (HFpEF) (HCC)   Primary hypertension   Essential hypertension   Anxiety and depression   Dyslipidemia   S/P MVR (mitral valve replacement)    Assessment and Plan: 53 year old female W/PMH of HTN, HLD, tobacco abuse, cerebral aneurysm, paroxysmal atrial fibrillation on amiodarone , severe MR s/p MVR who came into ED at [facility] for shortness of breath and chest pain.  Patient is stated that she had insurance issues recently and was not able to afford all those medications and  she was not taking atrial fibrillation and heart failure medications.  1.  A-fib with RVR - Patient has a mitral valve replacement and was on warfarin. - Patient was supposed to be on amiodarone  and Coreg  but she was not taking them. - Cardiologist advised for amiodarone  drip at this point along with Coreg . - Continue with those - Continue warfarin to maintain INR between 2.5-3.5 - Cardiology assistance appreciated  2.  Chest pain - May be related to musculoskeletal reason. - Troponins have been negative - Cardiology team advised no further evaluation warranted for chest pain  3.  Acute on chronic HFrEF - Patient to continue Lasix , spironolactone , Coreg , Jardiance  and Entresto . - Patient was not able to take them due to insurance issues and lack of affordability. - Continue all those medications except Jardiance  per cardiology recommendation - Cardiology assistance appreciated  4.  HTN/HLD - Resume home medications including above  5.  Mechanical mitral valve - As mentioned above continue warfarin  6.  Anxiety/depression - Denies any homicidal suicidal thoughts. - Will continue to monitor  6.  Hyperthyroidism/multinodular goiter - TSH is low and free T4 is slightly high - Will continue to monitor - If she  remains having elevated T4 then may need to consider endocrine evaluation.  7.  Hypokalemia - Magnesium  is normal - Potassium will be replaced - Will check potassium level in the morning      DVT prophylaxis: Coumadin  Code Status: Full Code Family Communication: None Disposition Plan: Home Consults called: Cardiology Admission status: Observation, Telemetry bed   Nena Rebel, MD [facility] 04/04/2024, 1:10 PM

## 2024-04-04 NOTE — ED Provider Notes (Signed)
 [facility] Provider Note    Event Date/Time   First MD Initiated Contact with Patient 04/04/24 [phone]     (approximate)   History   Chest Pain   HPI  Sandra Hines is a 53 year old female with history of mitral regurgitation s/p mitral valve replacement, A-fib, CHF, HTN presenting to the emergency department for evaluation of chest pain.  Patient reports that for the past few days she has felt a tightness in her chest with sharp pains with deep breath.  Reports taking her medication as directed, but in triage noted that she has been splitting medications.    Reviewed discharge summary from 10/07/2023.  At that time patient underwent sternotomy for mitral valve replacement with a PVI maze.  Also reviewed discharge summary from 10/22/2023.  At that time patient presented with NSTEMI in the setting of CHF exacerbation.  Was noted to have a negative preop catheterization on 10/03/2023.      Physical Exam   Triage Vital Signs: ED Triage Vitals  Encounter Vitals Group     BP 04/04/24 0733 (!) 148/121     Girls Systolic BP Percentile --      Girls Diastolic BP Percentile --      Boys Systolic BP Percentile --      Boys Diastolic BP Percentile --      Pulse Rate 04/04/24 0733 (!) 138     Resp 04/04/24 0733 19     Temp 04/04/24 0733 98.4 F (36.9 C)     Temp Source 04/04/24 0733 Oral     SpO2 04/04/24 0733 97 %     Weight 04/04/24 0734 220 lb (99.8 kg)     Height 04/04/24 0734 5' 7 (1.702 m)     Head Circumference --      Peak Flow --      Pain Score 04/04/24 0733 5     Pain Loc --      Pain Education --      Exclude from Growth Chart --     Most recent vital signs: Vitals:   04/04/24 0930 04/04/24 1030  BP: (!) 140/107 (!) 150/119  Pulse: (!) 123 (!) 125  Resp: 18 16  Temp:    SpO2: 95% 94%     General: Awake, interactive CV:  Tachycardic with regular rhythm, good peripheral perfusion Resp:  Unlabored respirations, lungs clear to  auscultation Abd:  Nondistended, soft, nontender Neuro:  Symmetric facial movement, fluid speech   ED Results / Procedures / Treatments   Labs (all labs ordered are listed, but only abnormal results are displayed) Labs Reviewed  CBC WITH DIFFERENTIAL/PLATELET - Abnormal; Notable for the following components:      Result Value   RDW 17.0 (*)    All other components within normal limits  COMPREHENSIVE METABOLIC PANEL WITH GFR - Abnormal; Notable for the following components:   Potassium 3.4 (*)    Glucose, Bld 178 (*)    Creatinine, Ser 1.08 (*)    Calcium  8.8 (*)    Albumin  3.4 (*)    AST 52 (*)    ALT 62 (*)    All other components within normal limits  PROTIME-INR - Abnormal; Notable for the following components:   Prothrombin Time 25.7 (*)    INR 2.2 (*)    All other components within normal limits  TSH - Abnormal; Notable for the following components:   TSH 0.327 (*)    All other components within normal limits  T4, FREE - Abnormal; Notable for the following components:   Free T4 1.13 (*)    All other components within normal limits  BRAIN NATRIURETIC PEPTIDE - Abnormal; Notable for the following components:   B Natriuretic Peptide 524.1 (*)    All other components within normal limits  MAGNESIUM   HCG, QUANTITATIVE, PREGNANCY  TROPONIN I (HIGH SENSITIVITY)  TROPONIN I (HIGH SENSITIVITY)     EKG EKG independently reviewed and interpreted by myself demonstrates:  EKG demonstrates narrow complex tachycardia with a regular rate, interpreted by computer sinus tachycardia, but on my review suspect possible atrial flutter with flutter waves most appreciable in lead II  RADIOLOGY Imaging independently reviewed and interpreted by myself demonstrates:  CXR without focal consolidation CTA chest without PE  Formal Radiology Read:  CT Angio Chest PE W and/or Wo Contrast Result Date: 04/04/2024 CLINICAL DATA:  53 year old female with intermittent chest pain. Shortness of  breath. Nausea. Prior cardiac surgery. EXAM: CT ANGIOGRAPHY CHEST WITH CONTRAST TECHNIQUE: Multidetector CT imaging of the chest was performed using the standard protocol during bolus administration of intravenous contrast. Multiplanar CT image reconstructions and MIPs were obtained to evaluate the vascular anatomy. RADIATION DOSE REDUCTION: This exam was performed according to the departmental dose-optimization program which includes automated exposure control, adjustment of the mA and/or kV according to patient size and/or use of iterative reconstruction technique. CONTRAST:  75mL OMNIPAQUE  IOHEXOL  350 MG/ML SOLN COMPARISON:  CTA chest 09/26/2023. Portable chest x-Lockie Bothun this morning. FINDINGS: Cardiovascular: Excellent contrast bolus timing in the pulmonary arterial tree. Mild central pulmonary artery enlargement. No pulmonary artery filling defect. Interval sternotomy and mitral valve replacement. No left heart or aorta contrast. Calcified aortic atherosclerosis. No pericardial effusion. Mediastinum/Nodes: Postoperative and chronic post granulomatous changes in the mediastinum. Chronic small calcified mediastinal and hilar lymph nodes. Lungs/Pleura: Major airways are patent. Widely scattered chronic calcified lung granulomas (no follow-up imaging recommended). No pleural effusion. No consolidation. Mild atelectasis. No convincing active lung inflammation. Upper Abdomen: Chronic calcified granulomas in the spleen. Negative visible noncontrast liver, gallbladder, pancreas, adrenal glands, left kidney, bowel in the upper abdomen. Musculoskeletal: Sternotomy. Lower thoracic bulky anterior endplate degeneration, new T8-T9 interbody ankylosis results. No acute osseous abnormality identified. Review of the MIP images confirms the above findings. IMPRESSION: 1. Negative for acute pulmonary embolus. 2. No acute or inflammatory process identified in the chest. Prior cardiac/cardiac valve surgery. Chronic post granulomatous  changes. 3. Aortic Atherosclerosis (ICD10-I70.0). Electronically Signed   By: VEAR Hurst M.D.   On: 04/04/2024 09:21   DG Chest Portable 1 View Result Date: 04/04/2024 CLINICAL DATA:  Chest pain EXAM: PORTABLE CHEST 1 VIEW COMPARISON:  Prior chest x-Keyari Kleeman 11/07/2023 FINDINGS: Patient is status post median sternotomy with evidence of prior mitral valve replacement and left atrial appendage ligation. Mild cardiomegaly and pulmonary vascular congestion without overt pulmonary edema. Patchy bibasilar airspace opacities favored to reflect atelectasis. Calcified granulomas again noted overlying the right lower lobe. No large effusion. No pneumothorax. No acute osseous abnormality. IMPRESSION: Cardiomegaly and mild pulmonary vascular congestion without overt pulmonary edema. Probable mild bibasilar atelectasis. Electronically Signed   By: Wilkie Lent M.D.   On: 04/04/2024 08:10    PROCEDURES:  Critical Care performed: Yes, see critical care procedure note(s)  CRITICAL CARE Performed by: Nilsa Dade   Total critical care time: 32 minutes  Critical care time was exclusive of separately billable procedures and treating other patients.  Critical care was necessary to treat or prevent imminent or life-threatening deterioration.  Critical care  was time spent personally by me on the following activities: development of treatment plan with patient and/or surrogate as well as nursing, discussions with consultants, evaluation of patient's response to treatment, examination of patient, obtaining history from patient or surrogate, ordering and performing treatments and interventions, ordering and review of laboratory studies, ordering and review of radiographic studies, pulse oximetry and re-evaluation of patient's condition.   Procedures   MEDICATIONS ORDERED IN ED: Medications  butalbital -acetaminophen -caffeine  (FIORICET ) 50-325-40 MG per tablet 1 tablet (has no administration in time range)  potassium  chloride SA (KLOR-CON  M) CR tablet 40 mEq (has no administration in time range)  amiodarone  (NEXTERONE ) 1.8 mg/mL load via infusion 150 mg (has no administration in time range)    Followed by  amiodarone  (NEXTERONE  PREMIX) 360-4.14 MG/200ML-% (1.8 mg/mL) IV infusion (has no administration in time range)    Followed by  amiodarone  (NEXTERONE  PREMIX) 360-4.14 MG/200ML-% (1.8 mg/mL) IV infusion (has no administration in time range)  carvedilol  (COREG ) tablet 3.125 mg (has no administration in time range)  furosemide  (LASIX ) injection 40 mg (has no administration in time range)  sacubitril -valsartan  (ENTRESTO ) 24-26 mg per tablet (has no administration in time range)  spironolactone  (ALDACTONE ) tablet 12.5 mg (has no administration in time range)  rosuvastatin  (CRESTOR ) tablet 40 mg (has no administration in time range)  metoprolol  tartrate (LOPRESSOR ) injection 5 mg (has no administration in time range)  acetaminophen  (TYLENOL ) tablet 650 mg (has no administration in time range)    Or  acetaminophen  (TYLENOL ) suppository 650 mg (has no administration in time range)  polyethylene glycol (MIRALAX  / GLYCOLAX ) packet 17 g (has no administration in time range)  ondansetron  (ZOFRAN ) tablet 4 mg (has no administration in time range)    Or  ondansetron  (ZOFRAN ) injection 4 mg (has no administration in time range)  fentaNYL  (SUBLIMAZE ) injection 50 mcg (50 mcg Intravenous Given 04/04/24 0758)  metoprolol  tartrate (LOPRESSOR ) tablet 25 mg (25 mg Oral Given 04/04/24 0834)  iohexol  (OMNIPAQUE ) 350 MG/ML injection 75 mL (75 mLs Intravenous Contrast Given 04/04/24 0904)     IMPRESSION / MDM / ASSESSMENT AND PLAN / ED COURSE  I reviewed the triage vital signs and the nursing notes.  Differential diagnosis includes, but is not limited to, ACS, PE, pneumonia, pneumothorax, pericarditis, arrhythmia, anemia, electrolyte abnormality  Patient's presentation is most consistent with acute presentation with  potential threat to life or bodily function.  53 year old female presenting to the emergency department for evaluation of chest pain.  Tachycardic on presentation.  Regular rate, but limited heart rate variability, suspect possible atrial flutter.  With report pleuritic chest pain and significant tachycardia, will obtain CT of the chest to further evaluate.  Labs sent.  Clinical Course as of 04/04/24 1219  Sat Apr 04, 2024  0825 Case discussed with Dr. Lonni with on-call cardiology.  Reports that she would typically not recommend elective cardioversion unless patient has a therapeutic INR documented for 3 weeks in a row.  She does recommend avoiding diltiazem  for this patient given risk for precipitating cardiogenic shock.  Does recommend starting with oral metoprolol , cardiology can consult to provide further recommendations.  If patient does become unstable, can be cardioverted at that time. [NR]  1119 Labs with reassuring CBC, BMP with mildly low potassium at 3.4.  Mag 2.2.  Will order some oral repletion.  Thyroid  studies with mildly elevated T4.  BNP elevated at 500 but no overt edema noted on imaging.  Negative troponin.  Patient reassessed.  Reports that she was  evaluated by cardiology team, pending recommendations.  Remains in atrial flutter on reevaluation, heart rates now in the 120s.  Reports headache not sudden in onset or different than prior.  Has previously had improvement with NSAIDs but has been told to avoid this.  Will trial Fioricet .  Do think she is appropriate for admission.  Will reach out to hospitalist team. [NR]  1145 Case reviewed with hospitalist team.  They did request trialing a dose of IV metoprolol  while awaiting cardiology recommendations, but will evaluate for anticipated admission. [NR]    Clinical Course User Index [NR] Levander Slate, MD     FINAL CLINICAL IMPRESSION(S) / ED DIAGNOSES   Final diagnoses:  Atrial flutter with rapid ventricular response (HCC)   Shortness of breath     Rx / DC Orders   ED Discharge Orders          Ordered    Amb referral to [facility]        04/04/24 1148             Note:  This document was prepared using Dragon voice recognition software and may include unintentional dictation errors.   Levander Slate, MD 04/04/24 [phone]

## 2024-04-04 NOTE — Consult Note (Signed)
 PHARMACY - ANTICOAGULATION CONSULT NOTE  Pharmacy Consult for warfarin Indication: atrial fibrillation s/p mechanical mitral valve  Allergies  Allergen Reactions   Penicillins Hives         Patient Measurements: Height: 5' 7 (170.2 cm) Weight: 99.8 kg (220 lb) IBW/kg (Calculated) : 61.6 HEPARIN  DW (KG): 83.8  Vital Signs: Temp: 98.4 F (36.9 C) (09/13 0733) Temp Source: Oral (09/13 0733) BP: 139/126 (09/13 1230) Pulse Rate: 126 (09/13 1230)  Labs: Recent Labs    04/04/24 0734 04/04/24 1002  HGB 13.6  --   HCT 43.1  --   PLT 264  --   LABPROT 25.7*  --   INR 2.2*  --   CREATININE 1.08*  --   TROPONINIHS 11 11    Estimated Creatinine Clearance: 75.7 mL/min (A) (by C-G formula based on SCr of 1.08 mg/dL (H)).   Medical History: Past Medical History:  Diagnosis Date   Cerebral aneurysm    a. 2019 s/p repair ([facility]).   Depression    Heart failure with mid-range ejection fraction (HCC)    a. 09/2023 Echo: EF 55-60%; b. 09/2023 Echo (post-op MVR): EF 25-30%; c. 12/2023 Echo: EF 40-45%, glob HK, nl RV fxn, nl fxn'ing MV prosthesis, Asc Ao 40mm.   Hypertension    Mitral regurgitation    a. 09/2023 Echo: Severe MR; b. 09/2023 s/p a 29 mm SJM Prosthesis, PVI MAZE w/ occlusion of the LAA with a 50 mm Medtronic clip; b. 12/2023 Echo: No MR.   Morbid obesity (HCC)    NICM (nonischemic cardiomyopathy) (HCC)    a. 09/2023 Echo: EF 55-60%, severe MR; b. 09/2023 Cath: Nl cors; c. 09/2023 Echo (post-op MVR): EF 25-30%; d. 12/2023 Echo: EF 40-45%, glob HK, nl RV fxn, nl fxn'ing MV prosthesis, Asc Ao 40mm.   PAF (paroxysmal atrial fibrillation) (HCC)    a. Dx 09/2023.  CHA2DS2VASc = 3-->warfarin; b. On amio; c. 10/2023 DCCV (150J).   Tobacco abuse     Medications:  -PTA warfarin regimen: take 2 tab (5mg  total) daily  -PTA meds with warfarin DDIs: amiodarone , crestor , duloxetine    Assessment: Patient is a 52yof presenting for persistent chest pain. Past medical history notable for  HTN, HLD, tobacco hx, cerebral aneurysm, pAfib on warfarin, severe mitral regurgitation s/p mitral valve replacement (10/07/2023), and HF (lvef 37%, 12/30/23). Started on amiodarone  in March 2025 following treatment for atrial fibrillation and mechanical mitral valve procedure. Regarding home meds, recently only taking amiodarone , warfarin, torsemide , and rosuvastatin  due to changes in her Medicaid. On admission, sbp 148, HR 138, and rhythm atrial flutter w/ rvr. Imaging with mild pulmonary vascular congestion and negative for PE.   Baseline labs 9/13 Hgb: 13.6     Plt: 264    INR: 2.2  Date     INR     At Goal 9/13      2.2      SUBtherapeutic  Goal of Therapy:  INR 2.5-3.5 Monitor platelets by anticoagulation protocol: Yes    Plan:  Home dose warfarin 5mg  ordered Considered starting at 1.5x home dose, but kept home dose to start given amiodarone  interaction  Check INR daily, and CBC next AM for now    Sandra Hines 04/04/2024,12:39 PM

## 2024-04-04 NOTE — ED Notes (Signed)
 Pt comfortable, message relayed to nurse for pain medicine, no other needs at this time

## 2024-04-04 NOTE — ED Notes (Signed)
 Messaged pharmacy about missing dose of medciations due at 1200.

## 2024-04-04 NOTE — ED Triage Notes (Addendum)
 Pt c/o intermittent central CP x2 days that is worse w/ inspiration. Pt c/o SHOB laying down and and nausea last night, denies dizziness. Pt had open heart surgery in march. Pt AOX4, tearful in triage. Pt states her insurance is cut and so she's been splitting medication doses. Pt takes warfarin.

## 2024-04-05 ENCOUNTER — Other Ambulatory Visit: Payer: Self-pay

## 2024-04-05 ENCOUNTER — Other Ambulatory Visit: Payer: Self-pay | Admitting: Student

## 2024-04-05 DIAGNOSIS — R0789 Other chest pain: Secondary | ICD-10-CM | POA: Diagnosis not present

## 2024-04-05 DIAGNOSIS — I5043 Acute on chronic combined systolic (congestive) and diastolic (congestive) heart failure: Secondary | ICD-10-CM | POA: Diagnosis not present

## 2024-04-05 DIAGNOSIS — I4892 Unspecified atrial flutter: Secondary | ICD-10-CM | POA: Diagnosis not present

## 2024-04-05 DIAGNOSIS — I480 Paroxysmal atrial fibrillation: Secondary | ICD-10-CM | POA: Diagnosis not present

## 2024-04-05 DIAGNOSIS — I10 Essential (primary) hypertension: Secondary | ICD-10-CM | POA: Diagnosis not present

## 2024-04-05 DIAGNOSIS — R7989 Other specified abnormal findings of blood chemistry: Secondary | ICD-10-CM

## 2024-04-05 LAB — CBC
HCT: 43.6 % (ref 36.0–46.0)
Hemoglobin: 13.8 g/dL (ref 12.0–15.0)
MCH: 27.5 pg (ref 26.0–34.0)
MCHC: 31.7 g/dL (ref 30.0–36.0)
MCV: 86.9 fL (ref 80.0–100.0)
Platelets: 261 K/uL (ref 150–400)
RBC: 5.02 MIL/uL (ref 3.87–5.11)
RDW: 16.9 % — ABNORMAL HIGH (ref 11.5–15.5)
WBC: 8.8 K/uL (ref 4.0–10.5)
nRBC: 0 % (ref 0.0–0.2)

## 2024-04-05 LAB — COMPREHENSIVE METABOLIC PANEL WITH GFR
ALT: 52 U/L — ABNORMAL HIGH (ref 0–44)
AST: 32 U/L (ref 15–41)
Albumin: 3.5 g/dL (ref 3.5–5.0)
Alkaline Phosphatase: 76 U/L (ref 38–126)
Anion gap: 9 (ref 5–15)
BUN: 19 mg/dL (ref 6–20)
CO2: 25 mmol/L (ref 22–32)
Calcium: 8.7 mg/dL — ABNORMAL LOW (ref 8.9–10.3)
Chloride: 103 mmol/L (ref 98–111)
Creatinine, Ser: 0.96 mg/dL (ref 0.44–1.00)
GFR, Estimated: >60 mL/min (ref >60)
Glucose, Bld: 156 mg/dL — ABNORMAL HIGH (ref 70–99)
Potassium: 3.7 mmol/L (ref 3.5–5.1)
Sodium: 137 mmol/L (ref 135–145)
Total Bilirubin: 0.5 mg/dL (ref 0.0–1.2)
Total Protein: 7.8 g/dL (ref 6.5–8.1)

## 2024-04-05 LAB — IRON AND TIBC
Iron: 48 ug/dL (ref 28–170)
Saturation Ratios: 15 % (ref 10.4–31.8)
TIBC: 330 ug/dL (ref 250–450)
UIBC: 282 ug/dL

## 2024-04-05 LAB — PROTIME-INR
INR: 2 — ABNORMAL HIGH (ref 0.8–1.2)
Prothrombin Time: 24.1 s — ABNORMAL HIGH (ref 11.4–15.2)

## 2024-04-05 LAB — GLUCOSE, CAPILLARY: Glucose-Capillary: 121 mg/dL — ABNORMAL HIGH (ref 70–99)

## 2024-04-05 LAB — T4, FREE: Free T4: 1.32 ng/dL — ABNORMAL HIGH (ref 0.61–1.12)

## 2024-04-05 LAB — VITAMIN B12: Vitamin B-12: 227 pg/mL (ref 180–914)

## 2024-04-05 LAB — VITAMIN D 25 HYDROXY (VIT D DEFICIENCY, FRACTURES): Vit D, 25-Hydroxy: 29 ng/mL — ABNORMAL LOW (ref 30–100)

## 2024-04-05 MED ORDER — TORSEMIDE 20 MG PO TABS: 20.0000 mg | ORAL_TABLET | Freq: Every day | ORAL | Status: DC

## 2024-04-05 MED ORDER — WARFARIN SODIUM 7.5 MG PO TABS
7.5000 mg | ORAL_TABLET | Freq: Once | ORAL | Status: DC
  Filled 2024-04-05: qty 1

## 2024-04-05 MED ORDER — ORAL CARE MOUTH RINSE: 15.0000 mL | OROMUCOSAL | Status: DC | PRN

## 2024-04-05 MED ORDER — DIPHENHYDRAMINE HCL 50 MG/ML IJ SOLN
50.0000 mg | Freq: Four times a day (QID) | INTRAMUSCULAR | Status: DC | PRN
  Administered 2024-04-05: 50 mg via INTRAVENOUS
  Filled 2024-04-05: qty 1

## 2024-04-05 MED ORDER — VITAMIN B-12 1000 MCG PO TABS
1000.0000 ug | ORAL_TABLET | Freq: Every day | ORAL | 0 refills | Status: AC
  Filled 2024-04-05: qty 90, 90d supply, fill #0

## 2024-04-05 MED ORDER — WARFARIN - PHARMACIST DOSING INPATIENT: Freq: Every day | Status: DC

## 2024-04-05 MED ORDER — SACUBITRIL-VALSARTAN 49-51 MG PO TABS: 1.0000 | ORAL_TABLET | Freq: Two times a day (BID) | ORAL | Status: DC

## 2024-04-05 MED ORDER — VITAMIN D (ERGOCALCIFEROL) 1.25 MG (50000 UNIT) PO CAPS
50000.0000 [IU] | ORAL_CAPSULE | ORAL | 0 refills | Status: AC
  Filled 2024-04-05: qty 4, 28d supply, fill #0

## 2024-04-05 MED ORDER — ALPRAZOLAM 0.25 MG PO TABS
0.2500 mg | ORAL_TABLET | Freq: Two times a day (BID) | ORAL | Status: DC | PRN
  Administered 2024-04-05: 0.25 mg via ORAL
  Filled 2024-04-05: qty 1

## 2024-04-05 MED ORDER — SACUBITRIL-VALSARTAN 24-26 MG PO TABS: 1.0000 | ORAL_TABLET | Freq: Once | ORAL | Status: DC

## 2024-04-05 NOTE — Progress Notes (Addendum)
 Cardiology Progress Note   Patient Name: Sandra Hines Date of Encounter: 04/05/2024  Primary Cardiologist: Evalene Lunger, MD  Subjective   Converted to sinus yesterday afternoon.  Chest pain resolved this AM.  Notes good UO and resolution of dyspnea.  I/O incomplete.  Eager to go home. Objective   Inpatient Medications    Scheduled Meds:  carvedilol   3.125 mg Oral BID WC   furosemide   40 mg Intravenous BID   rosuvastatin   40 mg Oral Daily   sacubitril -valsartan   1 tablet Oral BID   spironolactone   12.5 mg Oral Daily   warfarin  7.5 mg Oral ONCE-1600   Warfarin - Pharmacist Dosing Inpatient   Does not apply q1600   Continuous Infusions:  amiodarone  30 mg/hr (04/05/24 0903)   PRN Meds: acetaminophen  **OR** acetaminophen , ALPRAZolam , diphenhydrAMINE , ondansetron  **OR** ondansetron  (ZOFRAN ) IV, mouth rinse, oxyCODONE , polyethylene glycol   Vital Signs    Vitals:   04/04/24 2028 04/04/24 2326 04/05/24 0341 04/05/24 0754  BP:  123/86 (!) 134/98 (!) 149/93  Pulse:  73 74 71  Resp:  17 18 16   Temp:  98.1 F (36.7 C) 98.5 F (36.9 C) 98.2 F (36.8 C)  TempSrc:    Oral  SpO2:  99% 100% 100%  Weight: 102 kg     Height: 5' 7 (1.702 m)       Intake/Output Summary (Last 24 hours) at 04/05/2024 1030 Last data filed at 04/05/2024 0903 Gross per 24 hour  Intake 723.38 ml  Output --  Net 723.38 ml   Filed Weights   04/04/24 0734 04/04/24 2028  Weight: 99.8 kg 102 kg    Physical Exam   GEN: Well nourished, well developed, in no acute distress.  HEENT: Grossly normal.  Neck: Supple, no JVD, carotid bruits, or masses. Cardiac: RRR, no murmurs, rubs, or gallops. No clubbing, cyanosis, edema.  Radials 2+, DP/PT 2+ and equal bilaterally.  Respiratory:  Respirations regular and unlabored, bibasilar crackles. GI: Soft, nontender, nondistended, BS + x 4. MS: no deformity or atrophy. Skin: warm and dry, no rash. Neuro:  Strength and sensation are intact. Psych: AAOx3.   Normal affect.  Labs    Chemistry Recent Labs  Lab 04/04/24 0734 04/05/24 0453  NA 138 137  K 3.4* 3.7  CL 101 103  CO2 26 25  GLUCOSE 178* 156*  BUN 18 19  CREATININE 1.08* 0.96  CALCIUM  8.8* 8.7*  PROT 7.7 7.8  ALBUMIN  3.4* 3.5  AST 52* 32  ALT 62* 52*  ALKPHOS 68 76  BILITOT 0.6 0.5  GFRNONAA >60 >60  ANIONGAP 11 9     Hematology Recent Labs  Lab 04/04/24 0734 04/05/24 0453  WBC 10.1 8.8  RBC 4.91 5.02  HGB 13.6 13.8  HCT 43.1 43.6  MCV 87.8 86.9  MCH 27.7 27.5  MCHC 31.6 31.7  RDW 17.0* 16.9*  PLT 264 261    Cardiac Enzymes  Recent Labs  Lab 04/04/24 0734 04/04/24 1002  TROPONINIHS 11 11      BNP    Component Value Date/Time   BNP 524.1 (H) 04/04/2024 0734   Lipids  Lab Results  Component Value Date   CHOL 175 09/26/2023   HDL 41 09/26/2023   LDLCALC 118 (H) 09/26/2023   TRIG 79 09/26/2023   CHOLHDL 4.3 09/26/2023    HbA1c  Lab Results  Component Value Date   HGBA1C 5.8 (H) 07/19/2023   Lab Results  Component Value Date   INR 2.0 (H)  04/05/2024   INR 2.2 (H) 04/04/2024   INR 3.0 02/19/2024    Lab Results  Component Value Date   TSH 0.327 (L) 04/04/2024    Free T4 1.32.  Radiology    CT Angio Chest PE W and/or Wo Contrast Result Date: 04/04/2024 CLINICAL DATA:  53 year old female with intermittent chest pain. Shortness of breath. Nausea. Prior cardiac surgery. EXAM: CT ANGIOGRAPHY CHEST WITH CONTRAST TECHNIQUE: Multidetector CT imaging of the chest was performed using the standard protocol during bolus administration of intravenous contrast. Multiplanar CT image reconstructions and MIPs were obtained to evaluate the vascular anatomy. RADIATION DOSE REDUCTION: This exam was performed according to the departmental dose-optimization program which includes automated exposure control, adjustment of the mA and/or kV according to patient size and/or use of iterative reconstruction technique. CONTRAST:  75mL OMNIPAQUE  IOHEXOL  350 MG/ML  SOLN COMPARISON:  CTA chest 09/26/2023. Portable chest x-ray this morning. FINDINGS: Cardiovascular: Excellent contrast bolus timing in the pulmonary arterial tree. Mild central pulmonary artery enlargement. No pulmonary artery filling defect. Interval sternotomy and mitral valve replacement. No left heart or aorta contrast. Calcified aortic atherosclerosis. No pericardial effusion. Mediastinum/Nodes: Postoperative and chronic post granulomatous changes in the mediastinum. Chronic small calcified mediastinal and hilar lymph nodes. Lungs/Pleura: Major airways are patent. Widely scattered chronic calcified lung granulomas (no follow-up imaging recommended). No pleural effusion. No consolidation. Mild atelectasis. No convincing active lung inflammation. Upper Abdomen: Chronic calcified granulomas in the spleen. Negative visible noncontrast liver, gallbladder, pancreas, adrenal glands, left kidney, bowel in the upper abdomen. Musculoskeletal: Sternotomy. Lower thoracic bulky anterior endplate degeneration, new T8-T9 interbody ankylosis results. No acute osseous abnormality identified. Review of the MIP images confirms the above findings. IMPRESSION: 1. Negative for acute pulmonary embolus. 2. No acute or inflammatory process identified in the chest. Prior cardiac/cardiac valve surgery. Chronic post granulomatous changes. 3. Aortic Atherosclerosis (ICD10-I70.0). Electronically Signed   By: VEAR Hurst M.D.   On: 04/04/2024 09:21   DG Chest Portable 1 View Result Date: 04/04/2024 CLINICAL DATA:  Chest pain EXAM: PORTABLE CHEST 1 VIEW COMPARISON:  Prior chest x-ray 11/07/2023 FINDINGS: Patient is status post median sternotomy with evidence of prior mitral valve replacement and left atrial appendage ligation. Mild cardiomegaly and pulmonary vascular congestion without overt pulmonary edema. Patchy bibasilar airspace opacities favored to reflect atelectasis. Calcified granulomas again noted overlying the right lower lobe. No  large effusion. No pneumothorax. No acute osseous abnormality. IMPRESSION: Cardiomegaly and mild pulmonary vascular congestion without overt pulmonary edema. Probable mild bibasilar atelectasis. Electronically Signed   By: Wilkie Lent M.D.   On: 04/04/2024 08:10     Telemetry    Converted to sinus in ED @ 14:25 on 9/13.  Maintaining sinus rhythm in the 60's to 70's - Personally Reviewed  ECG    9.13.2025 - RSR, 67, LAE, LVH, TWI aVL, V1-V2 - Personally Reviewed  Cardiac Studies   2D Echocardiogram 6.9.2025  1. Left ventricular ejection fraction, by estimation, is 40 to 45%. Left  ventricular ejection fraction by PLAX is 37 %. The left ventricle has mild  to moderately decreased function. The left ventricle demonstrates global  hypokinesis. Left ventricular  diastolic parameters are indeterminate. The average left ventricular  global longitudinal strain is -12.3 %. The global longitudinal strain is  abnormal.   2. Right ventricular systolic function is normal. The right ventricular  size is normal.   3. The mitral valve has been repaired/replaced. No evidence of mitral  valve regurgitation. The mean mitral valve gradient is  4.0 mmHg. There is  a mechanical valve present in the mitral position.   4. The aortic valve is tricuspid. Aortic valve regurgitation is not  visualized.   5. Aortic dilatation noted. There is mild dilatation of the ascending  aorta, measuring 40 mm.   6. The inferior vena cava is normal in size with greater than 50%  respiratory variability, suggesting right atrial pressure of 3 mmHg.  _____________   Patient Profile     53 y.o. 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  Converted to sinus rhythm on 9/13 @ 14:25.  Assessment & Plan    1.  Atrial flutter w/ RVR/h/o Afib:  Afib dx in 09/2023.  On amio @ home.  Noted elev HR/clicking of mech MV on AM of 9/13 - was not particularly symptomatic. Found to be in rapid aflutter.  Converted to sinus rhythm on IV amio.  Had been off of ? blocker @ home - now resumed.  Will transition back to PO amio 200 BID, which was home dose.  With mild hyperthyroidism, would look to reduce amio to 200 mg daily as outpt.  Cont carvedilol .  Cont coumadin .  2.  Precordial Pain: Chronic incisional pain/tenderness since surgery but occurring more frequently, prompting presentation.  Now resolved.  Trops nl.  Prev nl cors on cath in 09/2023.  No further ischemic eval warranted.  3.  Acute on chronic HFmrEF:  EF dropped to 25-30% following mech MVR in 09/2023 w/ sl improvement on most recent echo in 12/2023 to 40-45%.  She ran out of carvedilol , entresto , spiro, and jardiance  about a week ago.  She subsequently noted some dyspnea and later developed rapid atrial flutter.  She was mildly volume overloaded on admission, for which, she has received IV lasix .  No outputs recorded, though she reports good UO and has been ambulating w/o difficulty.  Transition back to oral torsemide  20 daily at discharge.  Cont ? blocker, entresto  (titrate for elev BPs), spiro.  Resume jardiance  - will need pharmacy help as pt has questions re: affordability.  Discussed importance of medication compliance.  4.  Primary HTN:  hypertensive on presentation in setting of being off of home meds.  Pressures better throughout the day yesterday, but higher this AM.  Will increase entresto  to 48-51 BID.  5.  HL:  Cont statin.  6.  S/p mech MVR:  Severe MR noted in March 2025, status post mechanical mitral valve placement in March 2025. Normal device functioning by echo in June 2025. Notes compliance w/ warfarin.   INR subRx @ 2.0 - mgmt per pharmacy.  Will need close outpt f/u.  7.  Depression:  Per IM - stopped taking outpt meds.  8.  Hyperthyroidism/multinodular goiter:  predates amio and TSH/FT4 not significantly changed despite amio rx.  Likely playing a role in recurrent arrhythmias.  Should see endo as outpt.  Try and limit amio dose in outpt setting.  Signed, Lonni Meager, NP  04/05/2024, 10:30 AM    For questions or updates, please contact   Please consult [email or web address] for contact info under Cardiology/STEMI.

## 2024-04-05 NOTE — TOC Initial Note (Signed)
 Transition of Care [facility]) - Initial/Assessment Note    Patient Details  Name: Sandra Hines MRN: [id number] Date of Birth: 20-Sep-1970  Transition of Care [facility]) CM/SW Contact:    [place] L Alixandria Friedt, LCSW Phone Number: 04/05/2024, 1:19 PM  Clinical Narrative:                  CSW met with patient to complete brief assessment. Patient is discharging today. [facility] and she has a co-pay of $17.21. She reports that she can pay the co-pay. Patient has an application with SSI pending. She stated that she hasn't worked since she had surgery and she is living with her minor and adult children.   Patient advised that she has Community education [employer] and [employer] (Medicaid managed). She stated that [facility] was paying for all of her prescriptions but Medicaid won't cover the Jardiance . There is no generic brand in the [place] . CSW showed patient a website where she can signup and get a savings card. The savings card would bring her co-pay for Jardiance  down to $10.00 in most cases.   CSW and patient discussed long term plans for ensuring she gets her medication. Honestly there are limited options for the Jardiance  unless her PCP decides to place her on a different medication that is covered by her insurances.        Patient Goals and CMS Choice            Expected Discharge Plan and Services         Expected Discharge Date: 04/05/24                                    Prior Living Arrangements/Services                       Activities of Daily Living   ADL Screening (condition at time of admission) Independently performs ADLs?: Yes (appropriate for developmental age) Is the patient deaf or have difficulty hearing?: No Does the patient have difficulty seeing, even when wearing glasses/contacts?: No Does the patient have difficulty concentrating, remembering, or making decisions?: No  Permission Sought/Granted                  Emotional Assessment               Admission diagnosis:  Shortness of breath [R06.02] A-fib (HCC) [I48.91] Chronic atrial fibrillation (HCC) [I48.20] Atrial flutter with rapid ventricular response (HCC) [I48.92] Acute on chronic heart failure with preserved ejection fraction (HFpEF) (HCC) [I50.33] Patient Active Problem List   Diagnosis Date Noted   A-fib (HCC) 04/04/2024   Acute HFrEF (heart failure with reduced ejection fraction) (HCC) 10/21/2023   NSTEMI (non-ST elevated myocardial infarction) (HCC) 10/19/2023   Leukocytosis 10/19/2023   S/P MVR (mitral valve replacement) 10/07/2023   Acute on chronic heart failure with preserved ejection fraction (HFpEF) (HCC) 10/03/2023   Acute diastolic heart failure (HCC) 09/29/2023   Severe mitral valve regurgitation 09/28/2023   Atrial fibrillation (HCC) 09/25/2023   Acute CHF (congestive heart failure) (HCC) 09/25/2023   Essential hypertension 09/25/2023   Anxiety and depression 09/25/2023   Dyslipidemia 09/25/2023   Acute bronchitis due to other specified organisms 09/05/2023   Mood disorder (HCC) 07/20/2023   Arthritis 07/20/2023   Annual physical exam 07/19/2023   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 07/19/2023   Positive screening  for depression on 9-item Patient Health Questionnaire (PHQ-9) 07/19/2023   Establishing care with new doctor, encounter for 07/19/2023   Moderate tobacco dependence 07/19/2023   Encounter for screening for HIV 07/19/2023   Encounter for hepatitis C screening test for low risk patient 07/19/2023   Primary hypertension 07/19/2023   Screen for colon cancer 07/19/2023   Screening mammogram for breast cancer 07/19/2023   Screening for lung cancer 07/19/2023   BMI 32.0-32.9,adult 07/19/2023   Perimenopausal symptom 07/19/2023   PCP:  Sharma Coyer, MD Pharmacy:   [facility] [address] [place] [address] Phone:  [phone] Fax: [phone]  [facility] [address] [place] - [address] [place] [address] Phone: [phone] Fax: [phone]  [facility] [address] [place] [address] Phone: [phone] Fax: [phone]  [id number] [place], [place] - [address] [place] [address] Phone: [phone] Fax: [phone]  [facility] [address]), [place] - [address] [place]) [place] [address] Phone: [phone] Fax: [phone]     Social Drivers of Health (SDOH) Social History: SDOH Screenings   Food Insecurity: No Food Insecurity (04/04/2024)  Housing: Low Risk  (04/04/2024)  Transportation Needs: Unmet Transportation Needs (04/04/2024)  Utilities: Not At Risk (04/04/2024)  Depression (PHQ2-9): High Risk (01/21/2024)  Financial Resource Strain: Medium Risk (09/30/2023)  Social Connections: Moderately Integrated (10/20/2023)  Tobacco Use: High Risk (04/04/2024)   SDOH Interventions:     Readmission Risk Interventions    10/22/2023   11:31 AM 10/14/2023   12:15 PM  Readmission Risk Prevention Plan  Transportation Screening Complete Complete  PCP or Specialist Appt within 5-7 Days Complete Complete  Home Care Screening Complete Complete  Medication Review (RN CM) Complete Complete

## 2024-04-05 NOTE — Discharge Summary (Signed)
 [facility] Discharge Summary   Patient: Sandra Hines FMW:[id number]  PCP: Sharma Coyer, MD  Date of admission: 04/04/2024   Date of discharge:  04/05/2024     Discharge Diagnoses:  Principal Problem:   A-fib [facility]) Active Problems:   Acute on chronic heart failure with preserved ejection fraction (HFpEF) (HCC)   Primary hypertension   Essential hypertension   Anxiety and depression   Dyslipidemia   S/P MVR (mitral valve replacement)   Admitted From: Home Disposition:  Home   Recommendations for Outpatient Follow-up:  F/u with PCP in1 wk F/u with Cardio in 1 wk F/u with Endocrinologist in 1 wk  Follow up LABS/TEST: Free T4 level in few days    Follow-up Information     Simmons-Robinson, Makiera, MD Follow up in 1 week(s).   Specialty: Family Medicine Contact information: [address] [place] [address] [phone]                Diet recommendation: Cardiac diet  Activity: The patient is advised to gradually reintroduce usual activities, as tolerated  Discharge Condition: stable  Code Status: Full code   History of present illness: As per the H and P dictated on admission.  Hospital Course:  Sandra Hines is a pleasant 53 y.o. female with medical history significant for mitral regurgitation s/p mitral valve replacement in March 2025, A-fib on warfarin, CHF, HTN who presented to ED complaining of chest pain and shortness of breath.  Patient reports that for the past few days she has felt tightness in her chest with sharp pains with deep breathing.  Patient stated that her pain was 7/10 in intensity, sharp, nonradiating, worsening associate with exertion.  Patient stated that she was taking medications but she also told me that due to her insurance issues she was not able to take all the cardiac medications for few weeks. Patient admitted for mitral valve replacement, congestive heart failure and cardiac catheterization in  March 2025 Patient denies any fever, chills, cough, palpitations, leg swelling.   ED Course: Upon arrival to the ED, patient is found to be in A-fib with RVR, blood pressure 148/121, heart rate 138, EKG showed narrow complex tachycardia, chest x-ray without any consolidation, CTA chest without PE.  Cardiology seen the patient and advised that do not use Cardizem  for atrial fibrillation rather resume her supposed to be home medication amiodarone  and Coreg .  Hospitalist service was consulted for evaluation.    Assessment and Plan:   # A-fib with RVR - Patient has a mitral valve replacement and was on warfarin. - Patient was supposed to be on amiodarone  and Coreg  but she was not taking them. - Cardiologist advised for amiodarone  drip at this point along with Coreg . Continue warfarin to maintain INR between 2.5-3.5 9/14 patient was converted back to normal sinus rhythm.  Amiodarone  infusion was discontinued and patient was started on oral amiodarone  home dose.  Patient wanted to go home so patient was cleared by cardiology to discharge and follow-up as an outpatient.  # Chest pain: May be related to musculoskeletal reason. - Troponins have been negative - Cardiology recommended no intervention   # Acute on chronic HFrEF - Continue Lasix , spironolactone , Coreg , Jardiance  and Entresto . - Patient was not able to take them due to insurance issues and lack of affordability.  9/14 as per patient she would be able to pick up all the medications from the pharmacy with verified copayment which she cannot afford.  # HTN/HLD -  Resume home medications including above   # Mechanical mitral valve - As mentioned above continue warfarin   # Anxiety/depression: Denies any homicidal suicidal thoughts. Follow-up with psych as an outpatient   # Hyperthyroidism/multinodular goiter - TSH is low and free T4 is slightly high.  Patient was advised to follow with endocrinology as an outpatient.   #  Hypokalemia: Potassium repleted. Resolved - Magnesium  is normal  # Vitamin D  insufficiency: started vitamin D  50,000 units p.o. weekly, follow with PCP to repeat vitamin D  level after 3 to 6 months.  # Vitamin B12 level 227, goal >400.  Started vitamin B12 1000 mcg p.o. daily.Follow-up PCP to repeat vitamin B12 level after 3 to 6 months.  # Obesity class II Body mass index is 35.22 kg/m.  Nutrition Interventions: Calorie restricted diet and daily exercise advised to lose body weight.  Lifestyle modification discussed.   - Patient was instructed, not to drive, operate heavy machinery, perform activities at heights, swimming or participation in water activities or provide baby sitting services while on Pain, Sleep and Anxiety Medications; until her outpatient Physician has advised to do so again.  - Also recommended to not to take more than prescribed Pain, Sleep and Anxiety Medications.  Patient was ambulatory without any assistance. On the day of the discharge the patient's vitals were stable, and no other acute medical condition were reported by patient. the patient was felt safe to be discharge at Home.  Consultants: Cardiology Procedures: None  Discharge Exam: General: Appear in no distress, Oral Mucosa Clear, moist. Cardiovascular: S1 and S2 Present, no Murmur, Respiratory: normal respiratory effort, Bilateral Air entry present and no Crackles, no wheezes Abdomen: Bowel Sound present, Soft and no tenderness. Extremities: no Pedal edema, no calf tenderness Neurology: alert and oriented to time, place, and person affect appropriate.  Filed Weights   04/04/24 0734 04/04/24 2028  Weight: 99.8 kg 102 kg   Vitals:   04/05/24 0754 04/05/24 1133  BP: (!) 149/93 135/89  Pulse: 71 70  Resp: 16 18  Temp: 98.2 F (36.8 C) 98.3 F (36.8 C)  SpO2: 100% 98%    DISCHARGE MEDICATION: Allergies as of 04/05/2024       Reactions   Fentanyl  Itching   Penicillins Hives            Medication List     STOP taking these medications    oxyCODONE  5 MG immediate release tablet Commonly known as: Oxy IR/ROXICODONE        TAKE these medications    acetaminophen  325 MG tablet Commonly known as: TYLENOL  Take 2 tablets (650 mg total) by mouth every 4 (four) hours as needed for headache or mild pain (pain score 1-3).   ALPRAZolam  0.25 MG tablet Commonly known as: XANAX  Take 1 tablet (0.25 mg total) by mouth at bedtime as needed for anxiety.   amiodarone  200 MG tablet Commonly known as: PACERONE  Take 1 tablet (200 mg total) by mouth 2 (two) times daily.   aspirin  81 MG chewable tablet Chew 1 tablet (81 mg total) by mouth daily.   busPIRone  7.5 MG tablet Commonly known as: BUSPAR  Take 1 tablet (7.5 mg total) by mouth 2 (two) times daily.   carvedilol  3.125 MG tablet Commonly known as: COREG  Take 1 tablet (3.125 mg total) by mouth 2 (two) times daily with a meal.   cyclobenzaprine  5 MG tablet Commonly known as: FLEXERIL  Take 1 tablet (5 mg total) by mouth 3 (three) times daily as needed for muscle spasms.  DULoxetine  20 MG capsule Commonly known as: CYMBALTA  Take 2 capsules (40 mg total) by mouth daily.   Entresto  24-26 MG Generic drug: sacubitril -valsartan  Take 1 tablet by mouth 2 (two) times daily.   Fe Fum-Vit C-Vit B12-FA Caps capsule Commonly known as: TRIGELS-F FORTE Take 1 capsule by mouth daily.   Jardiance  10 MG Tabs tablet Generic drug: empagliflozin  Take 1 tablet (10 mg total) by mouth daily.   ondansetron  4 MG tablet Commonly known as: Zofran  Take 1 tablet (4 mg total) by mouth every 8 (eight) hours as needed for nausea or vomiting.   polyethylene glycol powder 17 GM/SCOOP powder Commonly known as: GLYCOLAX /MIRALAX  Take 17 g by mouth daily. Mix as directed.   rosuvastatin  40 MG tablet Commonly known as: CRESTOR  Take 1 tablet (40 mg total) by mouth daily.   spironolactone  25 MG tablet Commonly known as: ALDACTONE  Take 0.5  tablets (12.5 mg total) by mouth daily.   torsemide  20 MG tablet Commonly known as: DEMADEX  Take 1 tablet (20 mg total) by mouth daily.   triamcinolone  0.025 % ointment Commonly known as: KENALOG  Apply 1 Application topically 2 (two) times daily.   warfarin 2.5 MG tablet Commonly known as: COUMADIN  Take as directed. If you are unsure how to take this medication, talk to your nurse or doctor. Original instructions: Take 2 tablets by mouth daily or as directed by coumadin  clinic       Allergies  Allergen Reactions   Fentanyl  Itching   Penicillins Hives        Discharge Instructions     Amb referral to [facility]   Complete by: As directed    Amb referral to [facility] Clinic   Complete by: As directed    Call MD for:  difficulty breathing, headache or visual disturbances   Complete by: As directed    Call MD for:  extreme fatigue   Complete by: As directed    Call MD for:  persistant dizziness or light-headedness   Complete by: As directed    Call MD for:  persistant nausea and vomiting   Complete by: As directed    Call MD for:  severe uncontrolled pain   Complete by: As directed    Call MD for:  temperature >100.4   Complete by: As directed    Diet - low sodium heart healthy   Complete by: As directed    Discharge instructions   Complete by: As directed    F/u with PCP in1 wk F/u with Cardio in 1 wk   Increase activity slowly   Complete by: As directed        The results of significant diagnostics from this hospitalization (including imaging, microbiology, ancillary and laboratory) are listed below for reference.    Significant Diagnostic Studies: CT Angio Chest PE W and/or Wo Contrast Result Date: 04/04/2024 CLINICAL DATA:  53 year old female with intermittent chest pain. Shortness of breath. Nausea. Prior cardiac surgery. EXAM: CT ANGIOGRAPHY CHEST WITH CONTRAST TECHNIQUE: Multidetector CT imaging of the chest was performed using the standard protocol during  bolus administration of intravenous contrast. Multiplanar CT image reconstructions and MIPs were obtained to evaluate the vascular anatomy. RADIATION DOSE REDUCTION: This exam was performed according to the departmental dose-optimization program which includes automated exposure control, adjustment of the mA and/or kV according to patient size and/or use of iterative reconstruction technique. CONTRAST:  75mL OMNIPAQUE  IOHEXOL  350 MG/ML SOLN COMPARISON:  CTA chest 09/26/2023. Portable chest x-ray this morning. FINDINGS: Cardiovascular: Excellent contrast bolus  timing in the pulmonary arterial tree. Mild central pulmonary artery enlargement. No pulmonary artery filling defect. Interval sternotomy and mitral valve replacement. No left heart or aorta contrast. Calcified aortic atherosclerosis. No pericardial effusion. Mediastinum/Nodes: Postoperative and chronic post granulomatous changes in the mediastinum. Chronic small calcified mediastinal and hilar lymph nodes. Lungs/Pleura: Major airways are patent. Widely scattered chronic calcified lung granulomas (no follow-up imaging recommended). No pleural effusion. No consolidation. Mild atelectasis. No convincing active lung inflammation. Upper Abdomen: Chronic calcified granulomas in the spleen. Negative visible noncontrast liver, gallbladder, pancreas, adrenal glands, left kidney, bowel in the upper abdomen. Musculoskeletal: Sternotomy. Lower thoracic bulky anterior endplate degeneration, new T8-T9 interbody ankylosis results. No acute osseous abnormality identified. Review of the MIP images confirms the above findings. IMPRESSION: 1. Negative for acute pulmonary embolus. 2. No acute or inflammatory process identified in the chest. Prior cardiac/cardiac valve surgery. Chronic post granulomatous changes. 3. Aortic Atherosclerosis (ICD10-I70.0). Electronically Signed   By: VEAR Hurst M.D.   On: 04/04/2024 09:21   DG Chest Portable 1 View Result Date: 04/04/2024 CLINICAL  DATA:  Chest pain EXAM: PORTABLE CHEST 1 VIEW COMPARISON:  Prior chest x-ray 11/07/2023 FINDINGS: Patient is status post median sternotomy with evidence of prior mitral valve replacement and left atrial appendage ligation. Mild cardiomegaly and pulmonary vascular congestion without overt pulmonary edema. Patchy bibasilar airspace opacities favored to reflect atelectasis. Calcified granulomas again noted overlying the right lower lobe. No large effusion. No pneumothorax. No acute osseous abnormality. IMPRESSION: Cardiomegaly and mild pulmonary vascular congestion without overt pulmonary edema. Probable mild bibasilar atelectasis. Electronically Signed   By: Wilkie Lent M.D.   On: 04/04/2024 08:10    Microbiology: No results found for this or any previous visit (from the past 240 hours).   Labs: CBC: Recent Labs  Lab 04/04/24 0734 04/05/24 0453  WBC 10.1 8.8  NEUTROABS 6.8  --   HGB 13.6 13.8  HCT 43.1 43.6  MCV 87.8 86.9  PLT 264 261   Basic Metabolic Panel: Recent Labs  Lab 04/04/24 0734 04/05/24 0453  NA 138 137  K 3.4* 3.7  CL 101 103  CO2 26 25  GLUCOSE 178* 156*  BUN 18 19  CREATININE 1.08* 0.96  CALCIUM  8.8* 8.7*  MG 2.2  --    Liver Function Tests: Recent Labs  Lab 04/04/24 0734 04/05/24 0453  AST 52* 32  ALT 62* 52*  ALKPHOS 68 76  BILITOT 0.6 0.5  PROT 7.7 7.8  ALBUMIN  3.4* 3.5   No results for input(s): LIPASE, AMYLASE in the last 168 hours. No results for input(s): AMMONIA in the last 168 hours. Cardiac Enzymes: No results for input(s): CKTOTAL, CKMB, CKMBINDEX, TROPONINI in the last 168 hours. BNP (last 3 results) Recent Labs    09/25/23 1711 10/19/23 0741 04/04/24 0734  BNP 700.0* 1,629.6* 524.1*   CBG: Recent Labs  Lab 04/05/24 0819  GLUCAP 121*    Time spent: 35 minutes  Signed:  Elvan Sor  [facility] 04/05/2024 11:44 AM

## 2024-04-05 NOTE — Plan of Care (Signed)
  Problem: Activity: Goal: Ability to tolerate increased activity will improve Outcome: Progressing   Problem: Cardiac: Goal: Ability to achieve and maintain adequate cardiopulmonary perfusion will improve Outcome: Progressing   Problem: Clinical Measurements: Goal: Cardiovascular complication will be avoided Outcome: Progressing   Problem: Coping: Goal: Level of anxiety will decrease Outcome: Progressing   Problem: Pain Managment: Goal: General experience of comfort will improve and/or be controlled Outcome: Progressing   Problem: Safety: Goal: Ability to remain free from injury will improve Outcome: Progressing

## 2024-04-05 NOTE — Progress Notes (Signed)
 L arm PIV removed, instructed on strict return parameters and need to pick up medications today. Patient expressed understanding and will follow up with PCP and Cardiology. Patient wheeled to front and assisted to car.

## 2024-04-05 NOTE — Consult Note (Signed)
 PHARMACY - ANTICOAGULATION CONSULT NOTE  Pharmacy Consult for warfarin Indication: atrial fibrillation s/p mechanical mitral valve  Allergies  Allergen Reactions   Fentanyl  Itching   Penicillins Hives         Patient Measurements: Height: 5' 7 (170.2 cm) Weight: 102 kg (224 lb 14.4 oz) IBW/kg (Calculated) : 61.6 HEPARIN  DW (KG): 84.5  Vital Signs: Temp: 98.2 F (36.8 C) (09/14 0754) Temp Source: Oral (09/13 2023) BP: 149/93 (09/14 0754) Pulse Rate: 71 (09/14 0754)  Labs: Recent Labs    04/04/24 0734 04/04/24 1002 04/05/24 0453  HGB 13.6  --  13.8  HCT 43.1  --  43.6  PLT 264  --  261  LABPROT 25.7*  --  24.1*  INR 2.2*  --  2.0*  CREATININE 1.08*  --  0.96  TROPONINIHS 11 11  --     Estimated Creatinine Clearance: 86.1 mL/min (by C-G formula based on SCr of 0.96 mg/dL).   Medical History: Past Medical History:  Diagnosis Date   Cerebral aneurysm    a. 2019 s/p repair ([facility]).   Depression    Heart failure with mid-range ejection fraction (HCC)    a. 09/2023 Echo: EF 55-60%; b. 09/2023 Echo (post-op MVR): EF 25-30%; c. 12/2023 Echo: EF 40-45%, glob HK, nl RV fxn, nl fxn'ing MV prosthesis, Asc Ao 40mm.   Hypertension    Mitral regurgitation    a. 09/2023 Echo: Severe MR; b. 09/2023 s/p a 29 mm SJM Prosthesis, PVI MAZE w/ occlusion of the LAA with a 50 mm Medtronic clip; b. 12/2023 Echo: No MR.   Morbid obesity (HCC)    NICM (nonischemic cardiomyopathy) (HCC)    a. 09/2023 Echo: EF 55-60%, severe MR; b. 09/2023 Cath: Nl cors; c. 09/2023 Echo (post-op MVR): EF 25-30%; d. 12/2023 Echo: EF 40-45%, glob HK, nl RV fxn, nl fxn'ing MV prosthesis, Asc Ao 40mm.   PAF (paroxysmal atrial fibrillation) (HCC)    a. Dx 09/2023.  CHA2DS2VASc = 3-->warfarin; b. On amio; c. 10/2023 DCCV (150J).   Tobacco abuse     Medications:  -PTA warfarin regimen: 5mg  daily  -PTA meds with warfarin DDIs: amiodarone , crestor , duloxetine    Assessment: Patient is a 52yof presenting for  persistent chest pain. Past medical history notable for HTN, HLD, tobacco hx, cerebral aneurysm, pAfib on warfarin, severe mitral regurgitation s/p mitral valve replacement (10/07/2023), and HF (lvef 37%, 12/30/23). Started on amiodarone  in March 2025 following treatment for atrial fibrillation and mechanical mitral valve procedure. Regarding home meds, recently only taking amiodarone , warfarin, torsemide , and rosuvastatin  due to changes in her Medicaid. On admission, sbp 148, HR 138, and rhythm atrial flutter w/ rvr. Imaging with mild pulmonary vascular congestion and negative for PE.   Baseline labs 9/13 Hgb: 13.6     Plt: 264    INR: 2.2  DDI: amiodarone  continuous infusion(takes amiodarone  200mg  twice daily PTA)  Date:   INR:     Comment: 9/13     2.2       SUBtherapeutic 9/14 2.0 SUBtherapeutic  Goal of Therapy:  INR 2.5-3.5 Monitor platelets by anticoagulation protocol: Yes    Plan:  Warfarin 7.5mg  x 1 ordered Check INR daily, and CBC next AM for now    [facility] Sephora Boyar 04/05/2024,8:20 AM

## 2024-04-05 NOTE — Progress Notes (Signed)
 Patient seen walking alone at the hallway, crying. Patient said my son was brought to the ED and I'm on my way to emergency room to check to him. Patient was informed that she needs to stay on her room but patient insisted to go there. Courtney charge nurse made aware and suggested that she can have a quick visit as long as it's accompanied by RN. Patient was accompanied by the this RN via wheelchair. Patient went back to his room and requested for anxiety meds. Patient was given xanax  tab. Per Dr. HILARIO Solian MD order. No complain of chest pain.

## 2024-04-06 ENCOUNTER — Other Ambulatory Visit: Payer: Self-pay | Admitting: Internal Medicine

## 2024-04-06 ENCOUNTER — Other Ambulatory Visit: Payer: Self-pay

## 2024-04-06 ENCOUNTER — Other Ambulatory Visit: Payer: Self-pay | Admitting: Family Medicine

## 2024-04-06 ENCOUNTER — Other Ambulatory Visit (HOSPITAL_COMMUNITY): Payer: Self-pay

## 2024-04-06 DIAGNOSIS — Z952 Presence of prosthetic heart valve: Secondary | ICD-10-CM

## 2024-04-06 DIAGNOSIS — R21 Rash and other nonspecific skin eruption: Secondary | ICD-10-CM

## 2024-04-06 DIAGNOSIS — M62838 Other muscle spasm: Secondary | ICD-10-CM

## 2024-04-06 DIAGNOSIS — F5102 Adjustment insomnia: Secondary | ICD-10-CM

## 2024-04-06 MED ORDER — WARFARIN SODIUM 2.5 MG PO TABS
ORAL_TABLET | ORAL | 0 refills | Status: DC
  Filled 2024-04-06 – 2024-05-05 (×3): qty 60, 30d supply, fill #0

## 2024-04-06 MED ORDER — TRIAMCINOLONE ACETONIDE 0.025 % EX OINT
1.0000 | TOPICAL_OINTMENT | Freq: Two times a day (BID) | CUTANEOUS | 0 refills | Status: AC
  Filled 2024-04-06: qty 30, 15d supply, fill #0

## 2024-04-06 MED ORDER — CYCLOBENZAPRINE HCL 5 MG PO TABS
5.0000 mg | ORAL_TABLET | Freq: Three times a day (TID) | ORAL | 1 refills | Status: DC | PRN
  Filled 2024-04-06: qty 30, 10d supply, fill #0

## 2024-04-06 NOTE — Telephone Encounter (Signed)
 LOV- 01/21/2024 with Kellie, last time you seen patient was on 09/25/2023.  No showed- 03/31/2024 No showed- 12/11/2023 Canceled- 10/23/2023  NOV- 04/09/2024 LRF- 01/21/2024, Cyclobenzaprine  5 mg 30 x 1

## 2024-04-06 NOTE — Telephone Encounter (Signed)
 LOV 01/21/24 Next OV 04/09/24 Last refill 03/11/24, #30, 0 refills  Please review, thanks!

## 2024-04-07 ENCOUNTER — Other Ambulatory Visit (HOSPITAL_COMMUNITY): Payer: Self-pay

## 2024-04-07 ENCOUNTER — Other Ambulatory Visit: Payer: Self-pay

## 2024-04-07 ENCOUNTER — Encounter: Payer: Self-pay | Admitting: Pharmacist

## 2024-04-07 ENCOUNTER — Ambulatory Visit: Admitting: Endocrinology

## 2024-04-07 MED ORDER — ALPRAZOLAM 0.25 MG PO TABS
0.2500 mg | ORAL_TABLET | Freq: Every evening | ORAL | 0 refills | Status: DC | PRN
  Filled 2024-04-07: qty 30, 30d supply, fill #0

## 2024-04-08 ENCOUNTER — Other Ambulatory Visit: Payer: Self-pay

## 2024-04-09 ENCOUNTER — Ambulatory Visit (INDEPENDENT_AMBULATORY_CARE_PROVIDER_SITE_OTHER): Payer: MEDICAID | Admitting: Family Medicine

## 2024-04-09 ENCOUNTER — Encounter: Payer: Self-pay | Admitting: Pharmacist

## 2024-04-09 ENCOUNTER — Other Ambulatory Visit (HOSPITAL_COMMUNITY): Payer: Self-pay

## 2024-04-09 ENCOUNTER — Encounter: Payer: Self-pay | Admitting: Family Medicine

## 2024-04-09 ENCOUNTER — Other Ambulatory Visit: Payer: Self-pay

## 2024-04-09 VITALS — BP 130/88 | HR 71 | Temp 98.2°F | Ht 67.0 in | Wt 221.6 lb

## 2024-04-09 DIAGNOSIS — G4489 Other headache syndrome: Secondary | ICD-10-CM | POA: Diagnosis not present

## 2024-04-09 DIAGNOSIS — R7989 Other specified abnormal findings of blood chemistry: Secondary | ICD-10-CM

## 2024-04-09 DIAGNOSIS — I5022 Chronic systolic (congestive) heart failure: Secondary | ICD-10-CM

## 2024-04-09 DIAGNOSIS — Z599 Problem related to housing and economic circumstances, unspecified: Secondary | ICD-10-CM

## 2024-04-09 DIAGNOSIS — I480 Paroxysmal atrial fibrillation: Secondary | ICD-10-CM | POA: Diagnosis not present

## 2024-04-09 DIAGNOSIS — G44229 Chronic tension-type headache, not intractable: Secondary | ICD-10-CM | POA: Insufficient documentation

## 2024-04-09 DIAGNOSIS — F39 Unspecified mood [affective] disorder: Secondary | ICD-10-CM

## 2024-04-09 DIAGNOSIS — F5104 Psychophysiologic insomnia: Secondary | ICD-10-CM | POA: Diagnosis not present

## 2024-04-09 DIAGNOSIS — R413 Other amnesia: Secondary | ICD-10-CM

## 2024-04-09 DIAGNOSIS — F17200 Nicotine dependence, unspecified, uncomplicated: Secondary | ICD-10-CM

## 2024-04-09 DIAGNOSIS — Z720 Tobacco use: Secondary | ICD-10-CM

## 2024-04-09 DIAGNOSIS — Z952 Presence of prosthetic heart valve: Secondary | ICD-10-CM

## 2024-04-09 DIAGNOSIS — M62838 Other muscle spasm: Secondary | ICD-10-CM

## 2024-04-09 DIAGNOSIS — I252 Old myocardial infarction: Secondary | ICD-10-CM

## 2024-04-09 MED ORDER — ALPRAZOLAM 0.5 MG PO TABS
0.5000 mg | ORAL_TABLET | Freq: Two times a day (BID) | ORAL | 2 refills | Status: DC | PRN
  Filled 2024-04-09 – 2024-05-05 (×3): qty 60, 30d supply, fill #0
  Filled 2024-06-06: qty 60, 30d supply, fill #1

## 2024-04-09 MED ORDER — NICOTINE 7 MG/24HR TD PT24
7.0000 mg | MEDICATED_PATCH | Freq: Every day | TRANSDERMAL | 4 refills | Status: AC
  Filled 2024-04-09 – 2024-04-25 (×3): qty 28, 28d supply, fill #0

## 2024-04-09 MED ORDER — CYCLOBENZAPRINE HCL 5 MG PO TABS
5.0000 mg | ORAL_TABLET | Freq: Three times a day (TID) | ORAL | 1 refills | Status: AC | PRN
  Filled 2024-04-10 – 2024-04-25 (×2): qty 90, 30d supply, fill #0
  Filled 2024-08-07: qty 90, 30d supply, fill #1

## 2024-04-09 MED ORDER — SUMATRIPTAN SUCCINATE 25 MG PO TABS
25.0000 mg | ORAL_TABLET | ORAL | 0 refills | Status: DC | PRN
  Filled 2024-04-09: qty 9, 30d supply, fill #0
  Filled 2024-04-10: qty 10, 30d supply, fill #0
  Filled 2024-04-25: qty 9, 1d supply, fill #0

## 2024-04-09 MED ORDER — DULOXETINE HCL 40 MG PO CPEP: 40.0000 mg | ORAL_CAPSULE | Freq: Every day | ORAL | Status: DC

## 2024-04-09 NOTE — Patient Outreach (Signed)
 RNCM - Urgent referral received. Chart review conducted. Called patient and left message regarding CCM and number requesting call back to schedule.

## 2024-04-09 NOTE — Progress Notes (Signed)
 Established patient visit   Patient: Sandra Hines   DOB: [birth year]   53 y.o. Female  MRN: [id number] Visit Date: 04/09/2024  Today's healthcare provider: Rockie Agent, MD   Chief Complaint  Patient presents with   Follow-up    Patient presents for follow up of last hospital visit 9/13 for afib/ HF. Patient reports she is not feeling like herself, is having some insomnia, headaches since coming home.  Patient feels as if she is very depressed, reports that she stopped taking her cymbalta  for a bit    Nicotine  Dependence    Patient would like to try nicotine  patches to try to quit smoking    Obesity    Patient is wanting to try an injection for weight loss if able   Subjective     HPI     Follow-up    Additional comments: Patient presents for follow up of last hospital visit 9/13 for afib/ HF. Patient reports she is not feeling like herself, is having some insomnia, headaches since coming home.  Patient feels as if she is very depressed, reports that she stopped taking her cymbalta  for a bit         Nicotine  Dependence    Additional comments: Patient would like to try nicotine  patches to try to quit smoking         Obesity    Additional comments: Patient is wanting to try an injection for weight loss if able      Last edited by Cherry Chiquita HERO, CMA on 04/09/2024  9:25 AM.       Discussed the use of AI scribe software for clinical note transcription with the patient, who gave verbal consent to proceed.  History of Present Illness Sandra Hines is a 53 year old female with atrial fibrillation and heart failure who presents with pain and medication management.  She experiences significant headaches almost daily, with varying locations including the forehead and temporal regions. The pain is described as dull, numb, or sharp, and Tylenol  is ineffective despite taking six to seven tablets a day.  She has a history of atrial fibrillation and heart  failure, with a recent hospitalization on September 13th, where she was treated with an amiodarone  drip and transitioned to a home dose. She is on warfarin and has not had a cardiology follow-up since discharge.  She feels depressed, not like herself, and has difficulty sleeping with frequent awakenings at 3 AM. She stopped taking Cymbalta  but recently restarted it. She experiences anxiety and uses Xanax  0.25 mg at bedtime as needed, though its effectiveness is uncertain. She feels overwhelmed by financial strain, housing instability, and family stressors.  She has nicotine  dependence, smoking three to four cigarettes a day, and is interested in using nicotine  patches to quit. She also has obesity with a BMI of 34.7.  Her current medications include warfarin, amiodarone , duloxetine  40 mg twice daily, and carvedilol  3.125 mg twice daily. She has difficulty managing her medications due to insurance issues and copays, with her daughter assisting in administration.  She reports a thyroid  issue with elevated levels noted in December, contributing to her symptoms. She missed a follow-up endocrinology appointment and is scheduled to see her on November 10th. No diagnosis of thyroid  cancer.  She reports weight gain and unable to participate in PE    Past Medical History:  Diagnosis Date   Cerebral aneurysm    a. 2019 s/p repair ([facility]).   Depression  Heart failure with mid-range ejection fraction (HCC)    a. 09/2023 Echo: EF 55-60%; b. 09/2023 Echo (post-op MVR): EF 25-30%; c. 12/2023 Echo: EF 40-45%, glob HK, nl RV fxn, nl fxn'ing MV prosthesis, Asc Ao 40mm.   Hypertension    Mitral regurgitation    a. 09/2023 Echo: Severe MR; b. 09/2023 s/p a 29 mm SJM Prosthesis, PVI MAZE w/ occlusion of the LAA with a 50 mm Medtronic clip; b. 12/2023 Echo: No MR.   Morbid obesity (HCC)    NICM (nonischemic cardiomyopathy) (HCC)    a. 09/2023 Echo: EF 55-60%, severe MR; b. 09/2023 Cath: Nl cors; c. 09/2023 Echo (post-op  MVR): EF 25-30%; d. 12/2023 Echo: EF 40-45%, glob HK, nl RV fxn, nl fxn'ing MV prosthesis, Asc Ao 40mm.   PAF (paroxysmal atrial fibrillation) (HCC)    a. Dx 09/2023.  CHA2DS2VASc = 3-->warfarin; b. On amio; c. 10/2023 DCCV (150J).   Tobacco abuse     Medications: Outpatient Medications Prior to Visit  Medication Sig   acetaminophen  (TYLENOL ) 325 MG tablet Take 2 tablets (650 mg total) by mouth every 4 (four) hours as needed for headache or mild pain (pain score 1-3).   amiodarone  (PACERONE ) 200 MG tablet Take 1 tablet (200 mg total) by mouth 2 (two) times daily.   aspirin  81 MG chewable tablet Chew 1 tablet (81 mg total) by mouth daily.   busPIRone  (BUSPAR ) 7.5 MG tablet Take 1 tablet (7.5 mg total) by mouth 2 (two) times daily.   carvedilol  (COREG ) 3.125 MG tablet Take 1 tablet (3.125 mg total) by mouth 2 (two) times daily with a meal.   cyanocobalamin  (VITAMIN B12) 1000 MCG tablet Take 1 tablet (1,000 mcg total) by mouth daily.   Fe Fum-Vit C-Vit B12-FA (TRIGELS-F FORTE) CAPS capsule Take 1 capsule by mouth daily.   ondansetron  (ZOFRAN ) 4 MG tablet Take 1 tablet (4 mg total) by mouth every 8 (eight) hours as needed for nausea or vomiting.   polyethylene glycol powder (GLYCOLAX /MIRALAX ) 17 GM/SCOOP powder Take 17 g by mouth daily. Mix as directed.   rosuvastatin  (CRESTOR ) 40 MG tablet Take 1 tablet (40 mg total) by mouth daily.   sacubitril -valsartan  (ENTRESTO ) 24-26 MG Take 1 tablet by mouth 2 (two) times daily.   spironolactone  (ALDACTONE ) 25 MG tablet Take 0.5 tablets (12.5 mg total) by mouth daily.   torsemide  (DEMADEX ) 20 MG tablet Take 1 tablet (20 mg total) by mouth daily.   triamcinolone  (KENALOG ) 0.025 % ointment Apply 1 Application topically 2 (two) times daily.   Vitamin D , Ergocalciferol , (DRISDOL ) 1.25 MG (50000 UNIT) CAPS capsule Take 1 capsule (50,000 Units total) by mouth every 7 (seven) days.   warfarin (COUMADIN ) 2.5 MG tablet Take 2 tablets by mouth daily or as directed by  coumadin  clinic PLEASE SCHEDULE APPOINTMENT FOR MORE REFILLS   [DISCONTINUED] ALPRAZolam  (XANAX ) 0.25 MG tablet Take 1 tablet (0.25 mg total) by mouth at bedtime as needed for anxiety.   [DISCONTINUED] cyclobenzaprine  (FLEXERIL ) 5 MG tablet Take 1 tablet (5 mg total) by mouth 3 (three) times daily as needed for muscle spasms.   [DISCONTINUED] DULoxetine  (CYMBALTA ) 20 MG capsule Take 2 capsules (40 mg total) by mouth daily.   [DISCONTINUED] empagliflozin  (JARDIANCE ) 10 MG TABS tablet Take 1 tablet (10 mg total) by mouth daily.   No facility-administered medications prior to visit.    Review of Systems  Last metabolic panel Lab Results  Component Value Date   GLUCOSE 126 (H) 04/09/2024   NA 141 04/09/2024  K 3.9 04/09/2024   CL 97 04/09/2024   CO2 27 04/09/2024   BUN 20 04/09/2024   CREATININE 1.27 (H) 04/09/2024   GFRNONAA >60 04/05/2024   CALCIUM  9.2 04/09/2024   PHOS 3.5 10/22/2023   PROT 7.5 04/09/2024   ALBUMIN  4.1 04/09/2024   LABGLOB 3.4 04/09/2024   BILITOT 0.4 04/09/2024   ALKPHOS 100 04/09/2024   AST 41 (H) 04/09/2024   ALT 58 (H) 04/09/2024   ANIONGAP 9 04/05/2024   Last lipids Lab Results  Component Value Date   CHOL 175 09/26/2023   HDL 41 09/26/2023   LDLCALC 118 (H) 09/26/2023   TRIG 79 09/26/2023   CHOLHDL 4.3 09/26/2023   Last hemoglobin A1c Lab Results  Component Value Date   HGBA1C 5.8 (H) 07/19/2023        Objective    BP 130/88 (BP Location: Right Arm, Patient Position: Sitting, Cuff Size: Normal)   Pulse 71   Temp 98.2 F (36.8 C) (Oral)   Ht 5' 7 (1.702 m)   Wt 221 lb 9.6 oz (100.5 kg)   SpO2 100%   BMI 34.71 kg/m  BP Readings from Last 3 Encounters:  04/09/24 130/88  04/05/24 135/89  02/19/24 139/80   Wt Readings from Last 3 Encounters:  04/09/24 221 lb 9.6 oz (100.5 kg)  04/04/24 224 lb 14.4 oz (102 kg)  02/19/24 221 lb 12.8 oz (100.6 kg)        Physical Exam Constitutional:      General: She is not in acute  distress.    Appearance: Normal appearance. She is normal weight. She is not ill-appearing, toxic-appearing or diaphoretic.     Comments: Well groomed, frequently tearful, tired appearing   Cardiovascular:     Rate and Rhythm: Normal rate and regular rhythm.     Heart sounds: Murmur heard.  Pulmonary:     Effort: Pulmonary effort is normal.     Breath sounds: No wheezing, rhonchi or rales.  Abdominal:     General: Bowel sounds are normal.     Palpations: Abdomen is soft.     Tenderness: There is no abdominal tenderness.  Neurological:     Mental Status: She is alert and oriented to person, place, and time.  Psychiatric:        Attention and Perception: Attention and perception normal. She is attentive. She does not perceive auditory or visual hallucinations.        Mood and Affect: Mood is depressed. Affect is tearful.        Speech: Speech normal.        Behavior: Behavior normal. Behavior is cooperative.        Thought Content: Thought content normal. Thought content is not paranoid or delusional. Thought content does not include homicidal or suicidal ideation. Thought content does not include homicidal or suicidal plan.        Judgment: Judgment normal.       Results for orders placed or performed in visit on 04/09/24  TSH + free T4  Result Value Ref Range   TSH 0.263 (L) 0.450 - 4.500 uIU/mL   Free T4 1.82 (H) 0.82 - 1.77 ng/dL  Vitamin B12  Result Value Ref Range   Vitamin B-12 457 232 - 1,245 pg/mL  CMP14+EGFR  Result Value Ref Range   Glucose 126 (H) 70 - 99 mg/dL   BUN 20 6 - 24 mg/dL   Creatinine, Ser 8.72 (H) 0.57 - 1.00 mg/dL   eGFR 52 (L) >40  mL/min/1.73   BUN/Creatinine Ratio 16 9 - 23   Sodium 141 134 - 144 mmol/L   Potassium 3.9 3.5 - 5.2 mmol/L   Chloride 97 96 - 106 mmol/L   CO2 27 20 - 29 mmol/L   Calcium  9.2 8.7 - 10.2 mg/dL   Total Protein 7.5 6.0 - 8.5 g/dL   Albumin  4.1 3.9 - 4.9 g/dL   Globulin, Total 3.4 1.5 - 4.5 g/dL   Bilirubin Total 0.4 0.0  - 1.2 mg/dL   Alkaline Phosphatase 100 41 - 116 IU/L   AST 41 (H) 0 - 40 IU/L   ALT 58 (H) 0 - 32 IU/L  CBC  Result Value Ref Range   WBC 8.5 3.4 - 10.8 x10E3/uL   RBC 5.06 3.77 - 5.28 x10E6/uL   Hemoglobin 14.0 11.1 - 15.9 g/dL   Hematocrit 55.0 65.9 - 46.6 %   MCV 89 79 - 97 fL   MCH 27.7 26.6 - 33.0 pg   MCHC 31.2 (L) 31.5 - 35.7 g/dL   RDW 85.3 88.2 - 84.5 %   Platelets 238 150 - 450 x10E3/uL    Assessment & Plan     Problem List Items Addressed This Visit     A-fib (HCC)   Relevant Orders   AMB Referral VBCI Care Management   Acute HFrEF (heart failure with reduced ejection fraction) (HCC)   Chronic tension-type headache, not intractable - Primary   Relevant Medications   cyclobenzaprine  (FLEXERIL ) 5 MG tablet   DULoxetine  40 MG CPEP   SUMAtriptan  (IMITREX ) 25 MG tablet   Moderate tobacco dependence   Relevant Medications   nicotine  (NICODERM CQ  - DOSED IN MG/24 HR) 7 mg/24hr patch   Mood disorder (HCC)   Relevant Medications   DULoxetine  40 MG CPEP   NSTEMI (non-ST elevated myocardial infarction) (HCC)   S/P MVR (mitral valve replacement)   Other Visit Diagnoses       Psychophysiological insomnia       Relevant Medications   ALPRAZolam  (XANAX ) 0.5 MG tablet   Other Relevant Orders   AMB Referral [facility]BCI Care Management     Muscle spasm       Relevant Medications   cyclobenzaprine  (FLEXERIL ) 5 MG tablet     Memory changes       Relevant Orders   Vitamin B12 (Completed)   CMP14+EGFR (Completed)   CBC (Completed)     Elevated TSH       Relevant Orders   TSH + free T4 (Completed)     Tobacco use       Relevant Medications   nicotine  (NICODERM CQ  - DOSED IN MG/24 HR) 7 mg/24hr patch     Financial difficulties           Assessment and Plan Assessment & Plan Chronic heart failure with atrial fibrillation status post mitral valve replacement S/p MV replacement  Recent hospitalization for atrial fibrillation with rapid ventricular response on September  13, discharged on September 14. Currently on warfarin with a goal INR of 2.5 to 3.5. Previously on amiodarone  drip, now on home dose. Carvedilol  was not refilled since April. No recent cardiology follow-up since discharge. Prescribed spironolactone  12.5mg  daily but she is not able to remember this  - Ensure cardiology follow-up is scheduled - Continue warfarin with goal INR of 2.5 to 3.5, per cardiology  - Ensure carvedilol  3.125 mg twice daily is refilled - Continue amiodarone  as prescribed - continue ASA 81mg  daily  - continue entresto  24-26mg  BID  -  continue torsemide  20mg  daily    Thyroid  disorder, unspecified Previous thyroid  evaluation showed slight elevation. Missed endocrinology appointment, rescheduled for November 10. Concerns about thyroid  contributing to AFib. 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 Stopped duloxetine  but has restarted it. Xanax  0.25 mg at bedtime was not effective for sleep or anxiety. Buspirone  is being used for anxiety. Experiencing vivid dreams and daily headaches. No suicidal ideation but feels children might be better off without her. - Continue duloxetine  40 mg daily - Increase Xanax  to 0.5 mg twice daily - Continue buspirone  7.5mg  BID  for anxiety - Refer to [facility] for additional support and resources  Chronic headache Experiencing daily headaches, described as dull and numb in the forehead and sharp pains in the temporal region. Tylenol  is ineffective. - Prescribe Imitrex  25 mg for headaches, with a second dose allowed after 2 hours if needed, not to exceed two doses per day  Muscle spasm, chest and back Experiencing muscle spasms in the chest and back, possibly related to surgical site  healing. Flexeril  is used for muscle spasms. - Continue Flexeril  5 mg three times daily, refills provided today   Nicotine  dependence, tobacco use disorder Chronic  Currently smoking 3-4 cigarettes per day. Interested in smoking cessation. - Prescribe nicotine  patches 7 mg, to be worn during the day  HLD Chronic condition  Continue crestor  40mg  daily    Obesity, class 1 BMI of 34.7. Discussed potential use of Ozempic for weight management, pending thyroid  evaluation.pt reports that she continues to gain weight.  High risk cardivascular disease given valve disease requiring valve replacement, recent new diagnsosis of Afib and HF  - Consider Ozempic for weight management after thyroid  evaluation  SDOH  Financial Strain Patient reports significant financial strain causing her to move from her current home due to inability to afford rent -VBCI referral submitted    Memory Changes  Patient reports changes in memory since her hospitalization and heart valve replacement  Suspect this is related to prolonged hospital stay  Will order CMP, CBC and TSH today    Return in about 3 weeks (around 04/30/2024) for depression, meds, headaches.      Total time spent on today's visit was 65 minutes, including both face-to-face time interviewing and examining the patient, reviewing medical record including labs/imaging/specialist notes, developing and discussing further evaluation,answering patient's questions, ordering referrals to specialists, coordinating follow up care in addition to documenting in the patient's chart.     Rockie Agent, MD  [facility] [phone] (phone) [phone] (fax)  [facility]

## 2024-04-09 NOTE — Patient Instructions (Addendum)
  Your cardiology office has an appointment scheduled for tomorrow 04/10/24 at 2:20PM   To keep you healthy, please keep in mind the following health maintenance items that you are due for:   Health Maintenance Due  Topic Date Due   Pneumococcal Vaccine: 50+ Years (1 of 2 - PCV) Never done   Hepatitis B Vaccines 19-59 Average Risk (1 of 3 - 19+ 3-dose series) Never done   Cervical Cancer Screening (HPV/Pap Cotest)  Never done   Mammogram  Never done   Colonoscopy  Never done   DTaP/Tdap/Td (3 - Td or Tdap) 01/29/2022   Zoster Vaccines- Shingrix (1 of 2) Never done   Influenza Vaccine  Never done     Best Wishes,   Dr. Lang    Your cardiology office has an appointment scheduled for tomorrow 04/10/24 at 2:20PM

## 2024-04-10 ENCOUNTER — Encounter (HOSPITAL_COMMUNITY): Payer: Self-pay

## 2024-04-10 ENCOUNTER — Other Ambulatory Visit: Payer: Self-pay

## 2024-04-10 ENCOUNTER — Ambulatory Visit: Attending: Nurse Practitioner | Admitting: Nurse Practitioner

## 2024-04-10 ENCOUNTER — Other Ambulatory Visit: Payer: Self-pay | Admitting: Family Medicine

## 2024-04-10 ENCOUNTER — Other Ambulatory Visit (HOSPITAL_COMMUNITY): Payer: Self-pay

## 2024-04-10 LAB — CBC
Hematocrit: 44.9 % (ref 34.0–46.6)
Hemoglobin: 14 g/dL (ref 11.1–15.9)
MCH: 27.7 pg (ref 26.6–33.0)
MCHC: 31.2 g/dL — ABNORMAL LOW (ref 31.5–35.7)
MCV: 89 fL (ref 79–97)
Platelets: 238 x10E3/uL (ref 150–450)
RBC: 5.06 x10E6/uL (ref 3.77–5.28)
RDW: 14.6 % (ref 11.7–15.4)
WBC: 8.5 x10E3/uL (ref 3.4–10.8)

## 2024-04-10 LAB — CMP14+EGFR
ALT: 58 IU/L — ABNORMAL HIGH (ref 0–32)
AST: 41 IU/L — ABNORMAL HIGH (ref 0–40)
Albumin: 4.1 g/dL (ref 3.9–4.9)
Alkaline Phosphatase: 100 IU/L (ref 41–116)
BUN/Creatinine Ratio: 16 (ref 9–23)
BUN: 20 mg/dL (ref 6–24)
Bilirubin Total: 0.4 mg/dL (ref 0.0–1.2)
CO2: 27 mmol/L (ref 20–29)
Calcium: 9.2 mg/dL (ref 8.7–10.2)
Chloride: 97 mmol/L (ref 96–106)
Creatinine, Ser: 1.27 mg/dL — ABNORMAL HIGH (ref 0.57–1.00)
Globulin, Total: 3.4 g/dL (ref 1.5–4.5)
Glucose: 126 mg/dL — ABNORMAL HIGH (ref 70–99)
Potassium: 3.9 mmol/L (ref 3.5–5.2)
Sodium: 141 mmol/L (ref 134–144)
Total Protein: 7.5 g/dL (ref 6.0–8.5)
eGFR: 52 mL/min/1.73 — ABNORMAL LOW (ref >59)

## 2024-04-10 LAB — TSH+FREE T4
Free T4: 1.82 ng/dL — ABNORMAL HIGH (ref 0.82–1.77)
TSH: 0.263 u[IU]/mL — ABNORMAL LOW (ref 0.450–4.500)

## 2024-04-10 LAB — VITAMIN B12: Vitamin B-12: 457 pg/mL (ref 232–1245)

## 2024-04-10 NOTE — Progress Notes (Deleted)
 Office Visit    Patient Name: AARTI MANKOWSKI Date of Encounter: 04/10/2024  Primary Care Provider:  Sharma Coyer, MD Primary Cardiologist:  Evalene Lunger, MD    Chief Complaint    53 y.o. 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  Past Medical History   Subjective   Past Medical History:  Diagnosis Date   Cerebral aneurysm    a. 2019 s/p repair ([facility]).   Depression    Heart failure with mid-range ejection fraction (HCC)    a. 09/2023 Echo: EF 55-60%; b. 09/2023 Echo (post-op MVR): EF 25-30%; c. 12/2023 Echo: EF 40-45%, glob HK, nl RV fxn, nl fxn'ing MV prosthesis, Asc Ao 40mm.   Hypertension    Mitral regurgitation    a. 09/2023 Echo: Severe MR; b. 09/2023 s/p a 29 mm SJM Prosthesis, PVI MAZE w/ occlusion of the LAA with a 50 mm Medtronic clip; b. 12/2023 Echo: No MR.   Morbid obesity (HCC)    NICM (nonischemic cardiomyopathy) (HCC)    a. 09/2023 Echo: EF 55-60%, severe MR; b. 09/2023 Cath: Nl cors; c. 09/2023 Echo (post-op MVR): EF 25-30%; d. 12/2023 Echo: EF 40-45%, glob HK, nl RV fxn, nl fxn'ing MV prosthesis, Asc Ao 40mm.   PAF (paroxysmal atrial fibrillation) (HCC)    a. Dx 09/2023.  CHA2DS2VASc = 3-->warfarin; b. On amio; c. 10/2023 DCCV (150J).   Tobacco abuse    Past Surgical History:  Procedure Laterality Date   CARDIOVERSION N/A 11/01/2023   Procedure: CARDIOVERSION;  Surgeon: Cherrie Toribio SAUNDERS, MD;  Location: [facility] ORS;  Service: Cardiovascular;  Laterality: N/A;   CLIPPING OF ATRIAL APPENDAGE  10/07/2023   Procedure: CLIPPING, LEFT ATRIAL APPENDAGE USING MEDTRONIC PENDITURE LAA  EXCLUSION SYSTEM SIZE ;  Surgeon: Maryjane Mt, MD;  Location: [facility] OR;  Service: Open Heart Surgery;;   INTRAOPERATIVE TRANSESOPHAGEAL ECHOCARDIOGRAM N/A 10/07/2023   Procedure: ECHOCARDIOGRAM, TRANSESOPHAGEAL, INTRAOPERATIVE;  Surgeon: Maryjane Mt, MD;  Location: [facility] OR;  Service: Open Heart Surgery;  Laterality: N/A;   MAZE N/A 10/07/2023   Procedure: MAZE PROCEDURE;  Surgeon: Maryjane Mt, MD;  Location: [facility] OR;  Service: Open Heart Surgery;  Laterality: N/A;   MITRAL VALVE REPLACEMENT N/A 10/07/2023   Procedure: MITRAL VALVE REPLACEMENT USING SJM MASTERS SERIES MECHANICAL MITRAL HEART VALVE SIZE ;  Surgeon: Maryjane Mt, MD;  Location: [facility] OR;  Service: Open Heart Surgery;  Laterality: N/A;   RIGHT/LEFT HEART CATH AND CORONARY ANGIOGRAPHY N/A 10/03/2023   Procedure: RIGHT/LEFT HEART CATH AND CORONARY ANGIOGRAPHY;  Surgeon: Mady Bruckner, MD;  Location: [facility] INVASIVE CV LAB;  Service: Cardiovascular;  Laterality: N/A;   TEE WITHOUT CARDIOVERSION N/A 10/02/2023   Procedure: ECHOCARDIOGRAM, TRANSESOPHAGEAL;  Surgeon: Lunger Evalene PARAS, MD;  Location: [facility] ORS;  Service: Cardiovascular;  Laterality: N/A;    Allergies  Allergies  Allergen Reactions   Fentanyl  Itching   Penicillins Hives            History of Present Illness      53 y.o. y/o female with a history of hypertension, hyperlipidemia, tobacco abuse, cerebral aneurysm (repaired at [facility] in 2019), paroxysmal atrial fibrillation on amio, severe MR s/p mitral valve replacement (prosthetic MVR status post MAZE with occlusion of LAA (  10/07/2023), HFmrEF/NICM, mood disorder, arthritis, tobacco dependence, thyroid  nodules, and obesity.  In early March 2025, she was admitted to [facility] w/ Afib RVR, HTN, CHF, and resp failure/? PNA.  Echo showed EF 45-50% w/ severe MR.  Cath showed nl cors w/ elevated filling pressures, requiring additional diuresis.  She was tx to [facility] and seen by CT surgery in [place], and underwent mechanical MVR,  PVI/Maze, and LAA occlusion.  Due to post-op Afib, amio was started w/ conversion to sinus w/ periodic bradycardia.    She was readmitted 10/19/2023 with recurrent heart failure and new worsening of LV fxn, w/ echo showing EF of 25-30%, w/ trivial MR and mild-mod AI.  She had clinical improvement w/ diuresis and maintained sinus rhythm on amio, and was d/c on 10/22/2023.  Unfortunately, she had recurrent afib in the outpt setting and req DCCV (150J) on 11/01/2023.  In early March 2025, she was admitted to [facility] w/ Afib RVR, HTN, CHF, and resp failure/? PNA.  Echo showed EF 45-50% w/ severe MR.  Cath showed nl cors w/ elevated filling pressures, requiring additional diuresis.  She was tx to [facility] and seen by CT surgery in [place], and underwent mechanical MVR, PVI/Maze, and LAA occlusion.  Due to post-op Afib, amio was started w/ conversion to sinus w/ periodic bradycardia.    She was readmitted 10/19/2023 with recurrent heart failure and new worsening of LV fxn, w/ echo showing EF of 25-30%, w/ trivial MR and mild-mod AI.  She had clinical improvement w/ diuresis and maintained sinus rhythm on amio, and was d/c on 10/22/2023.  Unfortunately, she had recurrent afib in the outpt setting and req DCCV (150J) on 11/01/2023.  F/u echo 12/2023 showed some improvement in EF to 40-45% w/ global HK, nl RV fxn, nl fxn'ing MV prosthesis, and asc Ao of 4.0 cm.   Ms. Mcguinness has had some degree of chest wall/incisional pain and burning since her MVR in 09/2023.  She was admitted to [facility] in September 2025 secondary to more constant and sharp chest pain.  She also noted elevated heart rate.  In the emergency department, she was hypertensive and found to be in atrial flutter with rapid ventricular response.  Chest x-ray showed pulmonary vascular congestion.  CTA of the chest was negative for PE or other acute inflammatory process.  She was admitted and placed on intravenous amiodarone  with subsequent conversion to sinus rhythm  and she was placed back on her prior home dose of oral amiodarone .  In the setting of mild volume overload, she was diuresed with volume and symptomatic improvement.  TSH was low at 0.327 and free T4 was mildly elevated 1.13 with recommendation for outpatient endocrinology follow-up. TFT abnormalities and thyroid  nodules preceded initiation of amiodarone .    Objective   Home Medications    Current Outpatient Medications  Medication Sig Dispense Refill   acetaminophen  (TYLENOL ) 325 MG tablet Take 2 tablets (650 mg total) by mouth every 4 (four) hours as needed for headache or mild pain (pain score 1-3).     ALPRAZolam  (XANAX ) 0.5 MG tablet Take 1 tablet (0.5 mg total) by mouth 2 (two) times daily as needed for anxiety. 60 tablet 2   amiodarone  (PACERONE ) 200 MG tablet Take 1 tablet (200 mg total) by mouth 2 (two) times daily. 60 tablet 1   aspirin  81 MG chewable tablet Chew 1 tablet (81 mg total) by mouth daily.     busPIRone  (BUSPAR ) 7.5 MG tablet Take 1 tablet (7.5 mg  total) by mouth 2 (two) times daily. 120 tablet 1   carvedilol  (COREG ) 3.125 MG tablet Take 1 tablet (3.125 mg total) by mouth 2 (two) times daily with a meal. 60 tablet 11   cyanocobalamin  (VITAMIN B12) 1000 MCG tablet Take 1 tablet (1,000 mcg total) by mouth daily. 90 tablet 0   cyclobenzaprine  (FLEXERIL ) 5 MG tablet Take 1 tablet (5 mg total) by mouth 3 (three) times daily as needed for muscle spasms. 90 tablet 1   DULoxetine  40 MG CPEP Take 1 capsule (40 mg total) by mouth daily.     Fe Fum-Vit C-Vit B12-FA (TRIGELS-F FORTE) CAPS capsule Take 1 capsule by mouth daily. 30 capsule 5   nicotine  (NICODERM CQ  - DOSED IN MG/24 HR) 7 mg/24hr patch Place 1 patch (7 mg total) onto the skin daily. 60 patch 4   ondansetron  (ZOFRAN ) 4 MG tablet Take 1 tablet (4 mg total) by mouth every 8 (eight) hours as needed for nausea or vomiting. 30 tablet 0   polyethylene glycol powder (GLYCOLAX /MIRALAX ) 17 GM/SCOOP powder Take 17 g by mouth daily.  Mix as directed. 238 g 0   rosuvastatin  (CRESTOR ) 40 MG tablet Take 1 tablet (40 mg total) by mouth daily. 90 tablet 1   sacubitril -valsartan  (ENTRESTO ) 24-26 MG Take 1 tablet by mouth 2 (two) times daily. 60 tablet 11   spironolactone  (ALDACTONE ) 25 MG tablet Take 0.5 tablets (12.5 mg total) by mouth daily. 15 tablet 11   SUMAtriptan  (IMITREX ) 25 MG tablet Take 1 tablet (25 mg total) by mouth every 2 (two) hours as needed for headache. May repeat in 2 hours if headache persists or recurs. 10 tablet 0   torsemide  (DEMADEX ) 20 MG tablet Take 1 tablet (20 mg total) by mouth daily. 30 tablet 11   triamcinolone  (KENALOG ) 0.025 % ointment Apply 1 Application topically 2 (two) times daily. 30 g 0   Vitamin D , Ergocalciferol , (DRISDOL ) 1.25 MG (50000 UNIT) CAPS capsule Take 1 capsule (50,000 Units total) by mouth every 7 (seven) days. 12 capsule 0   warfarin (COUMADIN ) 2.5 MG tablet Take 2 tablets by mouth daily or as directed by coumadin  clinic PLEASE SCHEDULE APPOINTMENT FOR MORE REFILLS 60 tablet 0   No current facility-administered medications for this visit.     Physical Exam    VS:  There were no vitals taken for this visit. , BMI There is no height or weight on file to calculate BMI.        {The patient has an active order for outpatient cardiac rehabilitation.   Please indicate if the patient is ready to start. Do NOT delete this.  It will auto delete.  Refresh note, then sign.              Click here to document readiness and see contraindications.  :1}  Cardiac Rehabilitation Eligibility Assessment     GEN: Well nourished, well developed, in no acute distress. HEENT: normal. Neck: Supple, no JVD, carotid bruits, or masses. Cardiac: RRR, no murmurs, rubs, or gallops. No clubbing, cyanosis, edema.  Radials 2+/PT 2+ and equal bilaterally.  Respiratory:  Respirations regular and unlabored, clear to auscultation bilaterally. GI: Soft, nontender, nondistended, BS + x 4. MS: no deformity  or atrophy. Skin: warm and dry, no rash. Neuro:  Strength and sensation are intact. Psych: Normal affect.  Accessory Clinical Findings    ECG personally reviewed by me today -    *** - no acute changes.  Lab Results  Component Value Date  WBC 8.5 04/09/2024   HGB 14.0 04/09/2024   HCT 44.9 04/09/2024   MCV 89 04/09/2024   PLT 238 04/09/2024   Lab Results  Component Value Date   CREATININE 1.27 (H) 04/09/2024   BUN 20 04/09/2024   NA 141 04/09/2024   K 3.9 04/09/2024   CL 97 04/09/2024   CO2 27 04/09/2024   Lab Results  Component Value Date   ALT 58 (H) 04/09/2024   AST 41 (H) 04/09/2024   ALKPHOS 100 04/09/2024   BILITOT 0.4 04/09/2024   Lab Results  Component Value Date   CHOL 175 09/26/2023   HDL 41 09/26/2023   LDLCALC 118 (H) 09/26/2023   TRIG 79 09/26/2023   CHOLHDL 4.3 09/26/2023    Lab Results  Component Value Date   HGBA1C 5.8 (H) 07/19/2023   Lab Results  Component Value Date   TSH 0.263 (L) 04/09/2024       Assessment & Plan    1.  ***  Lonni Meager, NP 04/10/2024, 2:16 PM

## 2024-04-10 NOTE — Patient Outreach (Signed)
 RNCM - Urgent referral. Call #2 successful contact. Patient states informed re CCM program by provider and agreed to visit. Scheduled for 04/20/2024 at 1300. Answered questions about med transfer to pharmacy of choice. No urgent needs at this time.

## 2024-04-13 NOTE — Patient Outreach (Addendum)
 RN Care Manager referral to BSW for connection to [facility] for CCM. RNCM appointmet w/patient 04/20/24. Spoke with patient, attempted to schedule with BSW for connection with [facility]. Patient made aware we are not permitted to provide CCM services to [facility] patients and BSW would facilitate connection with CCM at [facility]. Patient states does not want to continue with [facility] and will contact them to cancel. Declined to schedule with BSW at this time. Patient states prefers tol keep 04/20/24 appointment with [facility] and will cancel [facility].

## 2024-04-14 ENCOUNTER — Ambulatory Visit: Payer: Self-pay | Admitting: Family Medicine

## 2024-04-14 ENCOUNTER — Encounter: Payer: Self-pay | Admitting: Family Medicine

## 2024-04-14 NOTE — Progress Notes (Signed)
 Seen by patient Sandra Hines on 04/14/2024  2:09 PM

## 2024-04-20 ENCOUNTER — Telehealth: Payer: Self-pay

## 2024-04-21 ENCOUNTER — Other Ambulatory Visit (HOSPITAL_COMMUNITY): Payer: Self-pay

## 2024-04-22 ENCOUNTER — Telehealth: Payer: Self-pay | Admitting: *Deleted

## 2024-04-22 NOTE — Progress Notes (Signed)
 Complex Care Management Care Guide Note  04/22/2024 Name: Sandra Hines MRN: [id number] DOB: Oct 09, 1970  Sandra Hines is a 53 y.o. year old female who is a primary care patient of Simmons-Robinson, [place]ie, MD and is actively engaged with the care management team. I reached out to Shaniyah B Nesmith by phone today to assist with scheduling  with the BSW.  Follow up plan: Telephone appointment with complex care management team member scheduled for:  10/3  Harlene Satterfield  [facility]  [facility], [facility] Guide  Direct Dial: [phone]  Fax [phone]

## 2024-04-24 ENCOUNTER — Other Ambulatory Visit: Payer: Self-pay | Admitting: Licensed Clinical Social Worker

## 2024-04-24 NOTE — Patient Outreach (Signed)
 04/24/2024  Patient is a [facility] patient and just moved to [place] and wants to change her insurance to [employer]. SW will fax the insurance form to the PCP and the patient wants a blank copy mailed to her just in case she needs to take it to her PCP.  SW will close this case after information has been sent.   .cns

## 2024-04-24 NOTE — Patient Instructions (Signed)
 Visit Information  Thank you for taking time to visit with me today. Please don't hesitate to contact me if I can be of assistance to you before our next scheduled appointment.  Our next appointment is no further scheduled appointments.   Please call the care guide team at [phone] if you need to cancel or reschedule your appointment.   Following is a copy of your care plan:   Goals Addressed   None     Please call the Suicide and Crisis Lifeline: 988 call 1-800-273-TALK (toll free, 24 hour hotline) call 911 if you are experiencing a Mental Health or Behavioral Health Crisis or need someone to talk to.  Patient verbalizes understanding of instructions and care plan provided today and agrees to view in MyChart. Active MyChart status and patient understanding of how to access instructions and care plan via MyChart confirmed with patient.     Sandra CHARM Maranda HEDWIG, PhD [facility], [facility] Social Worker Direct Dial: [phone]  Fax: [phone]

## 2024-04-25 ENCOUNTER — Other Ambulatory Visit: Payer: Self-pay

## 2024-05-05 ENCOUNTER — Other Ambulatory Visit: Payer: Self-pay

## 2024-05-18 ENCOUNTER — Encounter: Payer: Self-pay | Admitting: Family Medicine

## 2024-06-01 ENCOUNTER — Ambulatory Visit: Admitting: Endocrinology

## 2024-06-02 ENCOUNTER — Telehealth: Payer: Self-pay | Admitting: Family

## 2024-06-02 NOTE — Telephone Encounter (Signed)
 Called to confirm/remind patient of [facility] on [facility].   Appointment:   [] Confirmed  [] Left mess   [] No answer/No voice mail  [x] VM Full/unable to leave message  [] Phone not in service  Patient reminded to bring all medications and/or complete list.  Confirmed patient has transportation. Gave directions, instructed to utilize valet parking.

## 2024-06-02 NOTE — Progress Notes (Deleted)
 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     Admitted to [facility] 09/25/23 with A fib RVR. Echo showed LVEF ef 45-50% severe mitral regurgitation mean MV gradient 11 and mitral valve rheumatic.  TEE 2025 EF 55-60% RV low normal. LHC/RHC no CAD, CO 4.7 and CI 2.3 .Transferred to [facility] for CT surgery consultation. She underwent mechanical mitral valve with MAZE. Developed pauses and sinus arrest post cardioversion. GDMT limited by hyperkalemia. Placed on coumadin . Lasix  stopped on the day of discharge due to hyponatremia. Discharged 10/14/23.    After discharge she developed lower extremity edema and weight gain.    Presented to [facility]  10/19/23 with chest pain. CXR small pleural effusion and edema. CT chest - Small-mod left pleural effusion. Possible PNA or inflammation with post surgical changes from open heart surgery.  Bld CX NGTD, respiratory panel negative, BNP 1629, HS Trop 408>490,  WBC 25. Placed on antibiotics for possible PNA. Diuresing with IV lasix .   Echo 4/25  EF down 25-30% RV severely reduced. Diuresed well and discharged home on 10/22/23. Was placed on therapeutic dose lovenox  on d/c due to sub therapeutic INR/  Here with her sister-in-law. Says breathing ok. Feels fatigued. Tires easily. No swelling, orthopnea or PND. Compliant with meds. Says shots in her belly are causing bruising.  In clinic found to be back in AFL with RVR   Past Medical History:  Diagnosis Date   Cerebral aneurysm    a. 2019 s/p repair ([facility]).   Depression    Heart failure with mid-range ejection fraction (HCC)    a. 09/2023 Echo: EF 55-60%; b. 09/2023 Echo (post-op MVR): EF 25-30%; c. 12/2023 Echo: EF 40-45%,  glob HK, nl RV fxn, nl fxn'ing MV prosthesis, Asc Ao 40mm.   Hypertension    Mitral regurgitation    a. 09/2023 Echo: Severe MR; b. 09/2023 s/p a 29 mm SJM Prosthesis, PVI MAZE w/ occlusion of the LAA with a 50 mm Medtronic clip; b. 12/2023 Echo: No MR.   Morbid obesity (HCC)    NICM (nonischemic cardiomyopathy) (HCC)    a. 09/2023 Echo: EF 55-60%, severe MR; b. 09/2023 Cath: Nl cors; c. 09/2023 Echo (post-op MVR): EF 25-30%; d. 12/2023 Echo: EF 40-45%, glob HK, nl RV fxn, nl fxn'ing MV prosthesis, Asc Ao 40mm.   PAF (paroxysmal atrial fibrillation) (HCC)    a. Dx 09/2023.  CHA2DS2VASc = 3-->warfarin; b. On amio; c. 10/2023 DCCV (150J).   Tobacco abuse     Current Outpatient Medications  Medication Sig Dispense Refill   acetaminophen  (TYLENOL ) 325 MG tablet Take 2 tablets (650 mg total) by mouth every 4 (four) hours as needed for headache or mild pain (pain score 1-3).     ALPRAZolam  (XANAX ) 0.5 MG tablet Take 1 tablet (0.5 mg total) by mouth 2 (two) times daily as needed for anxiety. 60 tablet 2   amiodarone  (PACERONE ) 200 MG tablet Take 1 tablet (200 mg total) by mouth 2 (two) times daily. 60 tablet 1   aspirin  81 MG chewable tablet Chew 1 tablet (81 mg total) by mouth daily.     busPIRone  (BUSPAR ) 7.5 MG tablet Take 1 tablet (7.5 mg total) by mouth 2 (two) times daily.  120 tablet 1   carvedilol  (COREG ) 3.125 MG tablet Take 1 tablet (3.125 mg total) by mouth 2 (two) times daily with a meal. 60 tablet 11   cyanocobalamin  (VITAMIN B12) 1000 MCG tablet Take 1 tablet (1,000 mcg total) by mouth daily. 90 tablet 0   cyclobenzaprine  (FLEXERIL ) 5 MG tablet Take 1 tablet (5 mg total) by mouth 3 (three) times daily as needed for muscle spasms. 90 tablet 1   DULoxetine  40 MG CPEP Take 1 capsule (40 mg total) by mouth daily.     nicotine  (NICODERM CQ  - DOSED IN MG/24 HR) 7 mg/24hr patch Place 1 patch (7 mg total) onto the skin daily. 60 patch 4   ondansetron  (ZOFRAN ) 4 MG tablet Take 1 tablet (4 mg total) by  mouth every 8 (eight) hours as needed for nausea or vomiting. 30 tablet 0   polyethylene glycol powder (GLYCOLAX /MIRALAX ) 17 GM/SCOOP powder Take 17 g by mouth daily. Mix as directed. 238 g 0   rosuvastatin  (CRESTOR ) 40 MG tablet Take 1 tablet (40 mg total) by mouth daily. 90 tablet 1   sacubitril -valsartan  (ENTRESTO ) 24-26 MG Take 1 tablet by mouth 2 (two) times daily. 60 tablet 11   spironolactone  (ALDACTONE ) 25 MG tablet Take 0.5 tablets (12.5 mg total) by mouth daily. 15 tablet 11   SUMAtriptan  (IMITREX ) 25 MG tablet Take 1 tablet (25 mg total) by mouth every 2 (two) hours as needed for headache. May repeat in 2 hours if headache persists or recurs. 10 tablet 0   torsemide  (DEMADEX ) 20 MG tablet Take 1 tablet (20 mg total) by mouth daily. 30 tablet 11   triamcinolone  (KENALOG ) 0.025 % ointment Apply 1 Application topically 2 (two) times daily. 30 g 0   Vitamin D , Ergocalciferol , (DRISDOL ) 1.25 MG (50000 UNIT) CAPS capsule Take 1 capsule (50,000 Units total) by mouth every 7 (seven) days. 12 capsule 0   warfarin (COUMADIN ) 2.5 MG tablet Take 2 tablets by mouth daily or as directed by coumadin  clinic PLEASE SCHEDULE APPOINTMENT FOR MORE REFILLS 60 tablet 0   No current facility-administered medications for this visit.    Allergies  Allergen Reactions   Fentanyl  Itching   Penicillins Hives           Social History   Socioeconomic History   Marital status: Single    Spouse name: Not on file   Number of children: 8   Years of education: Not on file   Highest education level: Some college, no degree  Occupational History   Not on file  Tobacco Use   Smoking status: Some Days    Current packs/day: 1.00    Types: Cigarettes   Smokeless tobacco: Never   Tobacco comments:    Was smoking regularly prior to surgery in March but since then, has smoked about 1 pack of cigarettes.  Vaping Use   Vaping status: Never Used  Substance and Sexual Activity   Alcohol use: Yes     Alcohol/week: 2.0 standard drinks of alcohol    Types: 2 Shots of liquor per week    Comment: A few shots of liquor on the weekends.   Drug use: Yes    Types: Marijuana    Comment: Occasionally smokes marijuana when anxiety is overwhelming.   Sexual activity: Yes    Birth control/protection: None, Condom  Other Topics Concern   Not on file  Social History Narrative   Lives locally with children.  Has 8 children ranging in age from 36-30.  Does  not routine exercise.   Social Drivers of Health   Financial Resource Strain: Medium Risk (09/30/2023)   Overall Financial Resource Strain (CARDIA)    Difficulty of Paying Living Expenses: Somewhat hard  Food Insecurity: No Food Insecurity (04/04/2024)   Hunger Vital Sign    Worried About Running Out of Food in the Last Year: Never true    Ran Out of Food in the Last Year: Never true  Transportation Needs: Unmet Transportation Needs (04/04/2024)   PRAPARE - Administrator, Civil Service (Medical): Yes    Lack of Transportation (Non-Medical): No  Physical Activity: Not on file  Stress: Not on file  Social Connections: Moderately Integrated (10/20/2023)   Social Connection and Isolation Panel    Frequency of Communication with Friends and Family: Three times a week    Frequency of Social Gatherings with Friends and Family: Once a week    Attends Religious Services: 1 to 4 times per year    Active Member of [employer] or Organizations: Yes    Attends Banker Meetings: 1 to 4 times per year    Marital Status: Never married  Intimate Partner Violence: Not At Risk (04/04/2024)   Humiliation, Afraid, Rape, and Kick questionnaire    Fear of Current or Ex-Partner: No    Emotionally Abused: No    Physically Abused: No    Sexually Abused: No     No family history on file.   PHYSICAL EXAM: General:  Fatigued appearing. No respiratory difficulty HEENT: normal Neck: supple. no JVD. Carotids 2+ bilat; no bruits. No  lymphadenopathy or thryomegaly appreciated. Cor: Sternal wound ok Irregular tachy mech s1 (crisp)  Lungs: clear Abdomen: soft, nontender, nondistended. No hepatosplenomegaly. No bruits or masses. Good bowel sounds. Extremities: no cyanosis, clubbing, rash, edema Neuro: alert & oriented x 3, cranial nerves grossly intact. moves all 4 extremities w/o difficulty. Affect pleasant.  ECG: AFL 130 Personally reviewed  Wt Readings from Last 3 Encounters:  04/09/24 221 lb 9.6 oz (100.5 kg)  04/04/24 224 lb 14.4 oz (102 kg)  02/19/24 221 lb 12.8 oz (100.6 kg)    ASSESSMENT & PLAN:  1. Acute systolic CHF: S/p mechanical MV replacement.  Pre-op echo with EF 45-50%.  Cath pre-op with no significant CAD. Post-op echo this 4/25  EF 25-30%, mild LV dilation, moderate LVH, mild RV enlargement with mildly decreased systolic function, mechanical MV with mean gradient 11 mmHg, ?mild-moderate AI, IVC not dilated.  I suspect we are seeing the patient's true LV function in setting of higher afterload with severe mitral regurgitation repaired. - NYHA III in setting of recurrent AFL - Volume status not too bad - Continue torsemide  20 mg daily.  - Continue Entresto  24/26 bid.  - Continue spironolactone  12.5 mg daily.  - Continue Coreg  3.125 mg bid - Continue Jardiance  10 mg daily.  - labs today  2. 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  Patient had mechanical MV replacement with Maze and LA appendage occlusion on 10/07/23. - Continue warfarin for goal INR 2.5-3.5 - Continue ASA 81 daily.  - Currently on Lovenox . Will check INR today to assess INR.  - Contacted coumadin  clinic to arrange f/u for tomorrow (she did not have appointment set up) - appreciate their help. With need for mMVR and need for DC-CV would keep on lovenox  until INR >= 2.5 - Endocarditis prophylaxis with dentistry.  - Refer cardiac rehab  3. Atrial fibrillation/flutter: Paroxysmal.  She had Maze procedure with her surgery. -  She is in  AFL today with RVR - Increase amio to 200 bid - DC-CV this week. We have scheduled  4. Morbid obesity - Consider GLP1RA in the future  Ellouise DELENA Class, FNP  4:37 PM

## 2024-06-03 ENCOUNTER — Telehealth: Payer: Self-pay | Admitting: Family

## 2024-06-03 ENCOUNTER — Ambulatory Visit: Admitting: Family

## 2024-06-03 NOTE — Telephone Encounter (Signed)
 Patient did not show for her Heart Failure Clinic appointment on 06/03/24.

## 2024-06-06 ENCOUNTER — Other Ambulatory Visit: Payer: Self-pay | Admitting: Internal Medicine

## 2024-06-06 ENCOUNTER — Other Ambulatory Visit: Payer: Self-pay

## 2024-06-06 DIAGNOSIS — Z952 Presence of prosthetic heart valve: Secondary | ICD-10-CM

## 2024-06-06 MED ORDER — WARFARIN SODIUM 5 MG PO TABS
5.0000 mg | ORAL_TABLET | Freq: Every day | ORAL | 0 refills | Status: DC
  Filled 2024-06-06: qty 5, 5d supply, fill #0

## 2024-06-08 ENCOUNTER — Other Ambulatory Visit: Payer: Self-pay | Admitting: Internal Medicine

## 2024-06-08 ENCOUNTER — Other Ambulatory Visit: Payer: Self-pay

## 2024-06-08 ENCOUNTER — Telehealth: Payer: Self-pay

## 2024-06-08 DIAGNOSIS — Z952 Presence of prosthetic heart valve: Secondary | ICD-10-CM

## 2024-06-08 MED ORDER — AMIODARONE HCL 200 MG PO TABS
200.0000 mg | ORAL_TABLET | Freq: Two times a day (BID) | ORAL | 0 refills | Status: DC
  Filled 2024-06-08: qty 60, 30d supply, fill #0

## 2024-06-08 NOTE — Telephone Encounter (Signed)
 Left mother message to have daughter (patient) return call.  Patient's number not in service.

## 2024-06-08 NOTE — Telephone Encounter (Signed)
 Dr Bensimhon and Anti-coag clinic addressing refills

## 2024-06-09 ENCOUNTER — Other Ambulatory Visit: Payer: Self-pay

## 2024-06-17 ENCOUNTER — Ambulatory Visit: Payer: MEDICAID | Attending: Cardiology | Admitting: Cardiology

## 2024-06-17 ENCOUNTER — Ambulatory Visit: Payer: MEDICAID

## 2024-06-17 NOTE — Progress Notes (Deleted)
 Cardiology Office Note   Date:  06/17/2024  ID:  Sandra Hines, DOB September 02, 1970, MRN [id number] PCP: Sharma Coyer, MD  [facility] Providers Cardiologist:  Evalene Lunger, MD { Click to update primary MD,subspecialty MD or APP then REFRESH:1}    History of Present Illness Sandra Hines is a 53 y.o. female with history of mitral regurgitation s/p mitral valve replacement in March 2025, atrial fibrillation on warfarin, CHF, and hypertension.     10/29/2023 seen by Dr. Cherrie with her sister-in-law who stated her breathing was okay.  She does feel fatigued and gets tired easily.  She was euvolemic on exam and found to be in atrial flutter with RVR.  Amiodarone  was increased to 200 mg twice daily and she was scheduled for cardioversion.  11/01/2023 cardioversion procedure successful with 1 shock to normal sinus rhythm.  02/19/2024 seen in office and was overall feeling much better.  She did have some chest discomfort around her incision that wraps around her breast on both sides to her back.  She was released from CVTS and awaiting call from cardiac rehab.  She was seen in the ED on 04/04/24 with 7/10 nonradiating chest tightness.  There was some question if she was able to take her cardiac medicines due to insurance issues.  She was found to be in A-fib RVR and was admitted.  She converted to normal sinus rhythm 9/14 on amiodarone  drip.  Discharge on p.o. home dose. There have been expressed concerns on the cost of her medication and she has not been able to afford her cardiac medications.   She did not show advanced heart failure appointment 06/03/2024.    [name] is here today for overdue follow up. She has had issues with the affordability of her cardiac medications. She has not yet seen Pharm-D for recommendations on qualifying grants.   HFrEF/ NICM  - Continue current GDMT: carvedilol  3.125 mg, Entresto  24-26 mg, spironolactone  25 mg, and torsemide  20 mg.  -Schedule follow  up visit with AHF clinic.   Paroxysmal A-Fib - Continue amiodarone  200 mg daily.  - She reports no ***  MR s/p MVR - Continue aspirin  81 mg, and warfarin 5 mg daily. -She reports no bleeding, bruising, and hematuria, and hematochezia.  -Chadvasc score is ***   Hypertension - BP at home *** -Continue Entresto  24-26 mg, and carvedilol  3.125 mg.   Hyperlipidemia with goal <70  -Continue rosuvastatin  40 mg daily. - 09/26/2023 LDL 118    Anxiety  -Educated on relaxation techniques. -Continue Buspar  7.5 mg daily, Cymbalta  40 mg daily, and xanax  0.5 mg as needed.  ROS: ***  Studies Reviewed      *** Risk Assessment/Calculations {Does this patient have ATRIAL FIBRILLATION?[phone]} No BP recorded.  {Refresh Note OR Click here to enter BP  :1}***       Physical Exam VS:  There were no vitals taken for this visit.       Wt Readings from Last 3 Encounters:  04/09/24 221 lb 9.6 oz (100.5 kg)  04/04/24 224 lb 14.4 oz (102 kg)  02/19/24 221 lb 12.8 oz (100.6 kg)    GEN: Well nourished, well developed in no acute distress NECK: No JVD; No carotid bruits CARDIAC: ***RRR, no murmurs, rubs, gallops RESPIRATORY:  Clear to auscultation without rales, wheezing or rhonchi  ABDOMEN: Soft, non-tender, non-distended EXTREMITIES:  No edema; No deformity   ASSESSMENT AND PLAN ***    {Are you ordering a CV Procedure (e.g. stress test, cath,  DCCV, TEE, etc)?   Press F2        :[id number]}  Dispo: ***  Signed, Mardy KATHEE Pizza, FNP

## 2024-06-22 ENCOUNTER — Other Ambulatory Visit: Payer: Self-pay

## 2024-06-25 NOTE — Progress Notes (Signed)
 Cardiology Office Note   Date:  06/26/2024  ID:  Sandra Hines, DOB 20-Apr-1971, MRN [id number] PCP: Sharma Coyer, MD  [facility] Providers Cardiologist:  Evalene Lunger, MD   History of Present Illness Sandra Hines is a 53 y.o. 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   In early March 2025, she was admitted to [facility] w/ Afib RVR, HTN, CHF and resp failure/?PNA. Echo showed LVEF 45-50% w/ severe MR. Cath showed normal cors w/ elevted filling pressures, requiring additional diuresis. She was seen by CT surgery in [place], and underwent mechanical MVR, PVI/Maze, and LAA occlusion. Due to post-op Afib, amiodarone  was started w/ conversion to sinus w/ periodic bradycardia.   She was readmitted 10/19/2023 with recurrent heart failure and new worsening of LV fxn, w/ echo showing EF of 25-30%, w/ trivial MR and mild-mod AI. She had clinical improvement w/ diuresis and maintained sinus rhythm on amio, and was d/c on 10/22/2023. Unfortunately, she had recurrent afib in the outpt setting and req DCCV (150J) on 11/01/2023.   Follow-up echo 12/2023 showed improvement of EF to 40 to 45%, normal RV function, normal functioning mitral valve prosthesis, and a ascending aorta 4.0 cm.  Follow-up on 02/19/2024 she was clinically stable.  She reported burning and itching of her sternal scar.  She also reported intermittent sharp chest pain with no associated symptoms.  Patient was not taking all of her medications due to change in Medicaid and high co-pays.  She was admitted September 2025 with sharp chest pain and elevated heart rate. EKG showed Aflutter with heart rates in the 130s. She was loaded with amiodarone  and  coreg  was restarted. Warfarin was continued. The patient converted to NSR on IV amiodarone  and was transitioned to oral amiodarone . She was continued on ASA given mechanical valve.   Today, the patient reports she has not been taking warfarin, amiodarone , and has not been getting her INR checked. She reports a lot of stress at home with loss of job and housing. while having to are for 3 kids. She reports she has been taking her other cardiac medications.  She reports intermittent chest tightness ans shortness of breath that occur when she is overexerting herself.  She denies swelling on her feet, but reports weight gain (Weight 221>230lbs).  She no-showed heart failure appointment last month.  EKG shows NSR.   Studies Reviewed EKG Interpretation Date/Time:  Friday June 26 2024 09:32:20 EST Ventricular Rate:  79 PR Interval:  182 QRS Duration:  106 QT Interval:  430 QTC Calculation: 493 R Axis:   60  Text Interpretation: Normal sinus rhythm Prolonged QT When compared with ECG of 04-Apr-2024 14:32, PREVIOUS ECG IS PRESENT Confirmed by Franchester, Kevion Fatheree ([id number]) on 06/26/2024 9:44:10 AM    Echo 12/2023  1. Left ventricular ejection fraction, by estimation, is 40 to 45%. Left  ventricular ejection fraction by PLAX is 37 %. The left ventricle has mild  to moderately decreased function. The left ventricle demonstrates global  hypokinesis. Left ventricular  diastolic parameters are indeterminate. The average left ventricular  global longitudinal strain is -12.3 %. The global longitudinal strain is  abnormal.   2. Right ventricular systolic function is normal. The right ventricular  size is normal.   3.  The mitral valve has been repaired/replaced. No evidence of mitral  valve regurgitation. The mean mitral valve gradient is 4.0 mmHg. There is  a mechanical valve present in the mitral position.   4. The aortic valve is tricuspid. Aortic valve regurgitation is not  visualized.   5. Aortic  dilatation noted. There is mild dilatation of the ascending  aorta, measuring 40 mm.   6. The inferior vena cava is normal in size with greater than 50%  respiratory variability, suggesting right atrial pressure of 3 mmHg.   Echo 09/2023  1. Left ventricular ejection fraction, by estimation, is 25 to 30%. Left  ventricular ejection fraction by 3D volume is 18 %. The left ventricle has  severely decreased function. The left ventricle demonstrates global  hypokinesis. The left ventricular  internal cavity size was mildly dilated. There is moderate left  ventricular hypertrophy. Left ventricular diastolic function could not be  evaluated. The average left ventricular global longitudinal strain is -5.0  %. The global longitudinal strain is  abnormal.   2. Right ventricular systolic function is severely reduced. The right  ventricular size is mildly enlarged. Tricuspid regurgitation signal is  inadequate for assessing PA pressure.   3. Left atrial size was severely dilated.   4. The mitral valve has been repaired/replaced. Trivial mitral valve  regurgitation. The mean mitral valve gradient is 11.0 mmHg.   5. The aortic valve has an indeterminant number of cusps. Aortic valve  regurgitation is mild to moderate.   6. The inferior vena cava is normal in size with greater than 50%  respiratory variability, suggesting right atrial pressure of 3 mmHg.    R/L heart cath 09/2023 Conclusions: No angiographically significant atherosclerotic coronary artery disease.  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 Moderately elevated left heart, right heart, and pulmonary artery pressures.  Large v-waves observed on PCWP tracing consistent with the patient's severe mitral regurgitation. Low normal to mildly reduced Fick cardiac output/index.   Recommendations: Escalate diuresis and consider consultation with advanced heart failure team for optimization of acute diastolic heart failure in the  setting of severe mitral valve regurgitation. Cardiac surgery consultation for mitral valve replacement.  Response to medical therapy will dictate if this can be done as an outpatient or if the patient will need transfer to [facility] for inpatient evaluation. Resume heparin  infusion 2 hours after TR band removal. Primary prevention of coronary artery disease.   Lonni Hanson, MD [facility]  Echo TEE 09/2023 1. Left ventricular ejection fraction, by estimation, is 55 to 60%. The  left ventricle has normal function. The left ventricle has no regional  wall motion abnormalities.   2. Right ventricular systolic function is low normal. The right  ventricular size is normal.   3. The mitral valve is rheumatic, both anterior and posterior leaflets  involved. Severe mitral valve regurgitation, central and eccentric jet  noted. No evidence of mitral stenosis.   4. The aortic valve is normal in structure. Aortic valve regurgitation is  not visualized. No aortic stenosis is present.   5. The inferior vena cava is normal in size with greater than 50%  respiratory variability, suggesting right atrial pressure of 3 mmHg.   6. No left atrial/left atrial appendage thrombus was detected.   7. 3D performed of the mitral valve and demonstrates rheumatic mitral  valve.   Limited echo 10/01/2023 1. Left ventricular ejection fraction, by estimation, is 55 to 60%. The  left ventricle has normal function. The left  ventricular internal cavity  size was severely dilated. There is mild left ventricular hypertrophy.   2. Left atrial size was severely dilated.   3. 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 No  evidence of mitral stenosis.   4. The aortic valve is tricuspid. Aortic valve regurgitation is trivial.   5. There is normal pulmonary artery systolic pressure.   6. The inferior vena cava is normal in size with greater than 50%  respiratory variability, suggesting right  atrial pressure of 3 mmHg.    Echo 09/27/23 1. Left ventricular ejection fraction, by estimation, is 45 to 50%. The  left ventricle has mildly decreased function. The left ventricle  demonstrates global hypokinesis. The left ventricular internal cavity size  was mildly dilated. There is mild left  ventricular hypertrophy. Left ventricular diastolic parameters are  indeterminate.   2. Right ventricular systolic function is mildly reduced. The right  ventricular size is normal. There is normal pulmonary artery systolic  pressure. The estimated right ventricular systolic pressure is 25.8 mmHg.   3. Left atrial size was severely dilated.   4. 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 No  evidence of mitral stenosis. The mean mitral valve gradient is 11.0 mmHg.  Moderate mitral annular calcification.   5. Tricuspid valve regurgitation is mild to moderate.   6. The aortic valve is tricuspid. Aortic valve regurgitation is mild. No  aortic stenosis is present.   7. There is borderline dilatation of the ascending aorta, measuring 38  mm.   8. The inferior vena cava is normal in size with greater than 50%  respiratory variability, suggesting right atrial pressure of 3 mmHg.      Physical Exam VS:  BP 126/76 (BP Location: Left Arm, Patient Position: Sitting, Cuff Size: Normal)   Pulse 79   Ht 5' 7 (1.702 m)   Wt 230 lb 6.4 oz (104.5 kg)   SpO2 99%   BMI 36.09 kg/m   Orthostatic VS for the past 24 hrs (Last 3 readings):  BP- Lying Pulse- Lying BP- Sitting Pulse- Sitting BP- Standing at 0 minutes Pulse- Standing at 0 minutes BP- Standing at 3 minutes Pulse- Standing at 3 minutes  06/26/24 0934 128/88 82 122/88 82 (!) 127/95 87 (!) 132/93 87      Wt Readings from Last 3 Encounters:  06/26/24 230 lb 6.4 oz (104.5 kg)  04/09/24 221 lb 9.6 oz (100.5 kg)  04/04/24 224 lb 14.4 oz (102 kg)    GEN: Well nourished, well developed in no acute distress NECK: No JVD; No  carotid bruits CARDIAC: RRR, no murmurs, rubs, gallops RESPIRATORY:  Clear to auscultation without rales, wheezing or rhonchi  ABDOMEN: Soft, non-tender, non-distended EXTREMITIES:  No edema; No deformity   ASSESSMENT AND PLAN  Atrial Flutter The patient reports she has not been taking her amiodarone  or coumadin  or getting her INR checked due to stressors at home. Fortunately, she remains in NSR on EKG. We will restart coumadin  and amiodarone  200mg  daily. She was coming to the coumadin  clinic, so we will set her back up to regular INR checks. INR today. Continue Coreg  for rate control.   Chest tightness She has chronic incisional pain and tenderness that affects her sleep, for which I recommended Benadryl  . She also reported chest tightness on over-exertion. Cath in 09/2023 showed no significant CAD. We will make sure she is on her medications and re-check symptoms at follow-up. Continue ASA, Coreg , and Crestor .   DOE  Chronic HFmrEF Most recent echo showed LVEF 40-45%. Patient report she is taking all her heart medications, but we will send these into the pharmacy just in case. She reports DOE.She does not appear volume up on exam, but weight has gone up 221lbs>230lbs. I will check a BNP, BMET and CBC today. Continue Coreg  3.125mg BID, Entresto  24-26mg BID, Spiro 12.5mg  daily, and Torsemide  20mg  daily. Jardiance  is not on the list, so I wil send in Jardiance  10mg  daily. She no-showed her heart failure appointment, so we will re-schedule this.   HTN Blood pressure is good, continue current medications.   HL Continue Crestor  40mg  daily.   S/p mechanical MVR S/p mechanical mitral valve repair in 09/2023. Normally functioning device by echo in June 2025. She has not been compliant with warfarin. Restart warfarin and refer back to coumadin  clinic. INR today. INR goal 2.5-3.5. Continue ASA.  Hyperthyroidism/multinodular goiter PCP is following thyroid  levels.  Social issues Patient recently lost  her job and place of residence. She has trouble getting around and with her phone.      Dispo: Follow-up in 3 months  Signed, Casilda Pickerill VEAR Fishman, PA-C

## 2024-06-26 ENCOUNTER — Other Ambulatory Visit (HOSPITAL_COMMUNITY): Payer: Self-pay

## 2024-06-26 ENCOUNTER — Telehealth (HOSPITAL_COMMUNITY): Payer: Self-pay

## 2024-06-26 ENCOUNTER — Encounter: Payer: Self-pay | Admitting: Medical

## 2024-06-26 ENCOUNTER — Encounter (HOSPITAL_COMMUNITY): Payer: Self-pay

## 2024-06-26 ENCOUNTER — Ambulatory Visit: Payer: MEDICAID | Attending: Medical | Admitting: Medical

## 2024-06-26 ENCOUNTER — Telehealth: Payer: Self-pay | Admitting: Pharmacy Technician

## 2024-06-26 ENCOUNTER — Other Ambulatory Visit: Payer: Self-pay

## 2024-06-26 VITALS — BP 126/76 | HR 79 | Ht 67.0 in | Wt 230.4 lb

## 2024-06-26 DIAGNOSIS — R0609 Other forms of dyspnea: Secondary | ICD-10-CM | POA: Diagnosis not present

## 2024-06-26 DIAGNOSIS — I110 Hypertensive heart disease with heart failure: Secondary | ICD-10-CM | POA: Diagnosis not present

## 2024-06-26 DIAGNOSIS — I4892 Unspecified atrial flutter: Secondary | ICD-10-CM

## 2024-06-26 DIAGNOSIS — R0789 Other chest pain: Secondary | ICD-10-CM | POA: Diagnosis not present

## 2024-06-26 DIAGNOSIS — I5022 Chronic systolic (congestive) heart failure: Secondary | ICD-10-CM | POA: Diagnosis not present

## 2024-06-26 DIAGNOSIS — E782 Mixed hyperlipidemia: Secondary | ICD-10-CM

## 2024-06-26 DIAGNOSIS — I10 Essential (primary) hypertension: Secondary | ICD-10-CM

## 2024-06-26 DIAGNOSIS — Z952 Presence of prosthetic heart valve: Secondary | ICD-10-CM

## 2024-06-26 DIAGNOSIS — Z59819 Housing instability, housed unspecified: Secondary | ICD-10-CM

## 2024-06-26 DIAGNOSIS — Z79899 Other long term (current) drug therapy: Secondary | ICD-10-CM

## 2024-06-26 DIAGNOSIS — E0590 Thyrotoxicosis, unspecified without thyrotoxic crisis or storm: Secondary | ICD-10-CM

## 2024-06-26 MED ORDER — AMIODARONE HCL 200 MG PO TABS
200.0000 mg | ORAL_TABLET | Freq: Two times a day (BID) | ORAL | 3 refills | Status: DC
  Filled 2024-06-26: qty 180, 90d supply, fill #0

## 2024-06-26 MED ORDER — EMPAGLIFLOZIN 10 MG PO TABS
10.0000 mg | ORAL_TABLET | Freq: Every day | ORAL | 3 refills | Status: DC
  Filled 2024-06-26 – 2024-08-12 (×2): qty 90, 90d supply, fill #0

## 2024-06-26 MED ORDER — SPIRONOLACTONE 25 MG PO TABS
12.5000 mg | ORAL_TABLET | Freq: Every day | ORAL | 3 refills | Status: DC
  Filled 2024-06-26 – 2024-06-28 (×2): qty 45, 90d supply, fill #0

## 2024-06-26 MED ORDER — CARVEDILOL 3.125 MG PO TABS
3.1250 mg | ORAL_TABLET | Freq: Two times a day (BID) | ORAL | 3 refills | Status: DC
  Filled 2024-06-26 – 2024-06-28 (×2): qty 90, 45d supply, fill #0

## 2024-06-26 MED ORDER — SACUBITRIL-VALSARTAN 24-26 MG PO TABS
1.0000 | ORAL_TABLET | Freq: Two times a day (BID) | ORAL | 3 refills | Status: DC
  Filled 2024-06-26 – 2024-06-28 (×2): qty 180, 90d supply, fill #0

## 2024-06-26 MED ORDER — TORSEMIDE 20 MG PO TABS
20.0000 mg | ORAL_TABLET | Freq: Every day | ORAL | 3 refills | Status: DC
  Filled 2024-06-26 – 2024-06-28 (×2): qty 90, 90d supply, fill #0

## 2024-06-26 NOTE — Telephone Encounter (Signed)
 Pharmacy Patient Advocate Encounter  Received notification from [facility] MEDICAID that Prior Authorization for jardiance  has been APPROVED from 06/26/24 to 06/26/25   PA #/Case ID/Reference #: [id number]

## 2024-06-26 NOTE — Telephone Encounter (Signed)
   Pharmacy Patient Advocate Encounter   Received notification from Pt Calls Messages that prior authorization for JARDIANCE  10MG  is required/requested.   Insurance verification completed.   The patient is insured through [facility] MEDICAID.   Per test claim: PA required; PA submitted to above mentioned insurance via Latent Key/confirmation #/EOC [id number] Status is pending

## 2024-06-26 NOTE — Patient Instructions (Addendum)
 Medication Instructions:  Your physician recommends the following medication changes.  START TAKING: Jardiance  10 mg by mouth daily    *If you need a refill on your cardiac medications before your next appointment, please call your pharmacy*  Lab Work: Your provider would like for you to have following labs drawn today INR/PT, BMP, CBC, BNP.     Testing/Procedures: No test ordered today   Follow-Up: At [facility], you and your health needs are our priority.  As part of our continuing mission to provide you with exceptional heart care, our providers are all part of one team.  This team includes your primary Cardiologist (physician) and Advanced Practice Providers or APPs (Physician Assistants and Nurse Practitioners) who all work together to provide you with the care you need, when you need it.  Your next appointment:   3 month(s)  Provider:   Timothy Gollan, MD or Cadence Franchester RIGGERS     Your physician recommends that you schedule a follow-up appointment with heart failure team.

## 2024-06-26 NOTE — Telephone Encounter (Signed)
 Sent for pa

## 2024-06-28 ENCOUNTER — Other Ambulatory Visit (HOSPITAL_COMMUNITY): Payer: Self-pay

## 2024-06-28 LAB — BASIC METABOLIC PANEL WITH GFR
BUN/Creatinine Ratio: 11 (ref 9–23)
BUN: 17 mg/dL (ref 6–24)
CO2: 23 mmol/L (ref 20–29)
Calcium: 8.9 mg/dL (ref 8.7–10.2)
Chloride: 101 mmol/L (ref 96–106)
Creatinine, Ser: 1.48 mg/dL — ABNORMAL HIGH (ref 0.57–1.00)
Glucose: 136 mg/dL — ABNORMAL HIGH (ref 70–99)
Potassium: 4 mmol/L (ref 3.5–5.2)
Sodium: 141 mmol/L (ref 134–144)
eGFR: 43 mL/min/1.73 — ABNORMAL LOW (ref >59)

## 2024-06-28 LAB — CBC
Hematocrit: 41.8 % (ref 34.0–46.6)
Hemoglobin: 13.1 g/dL (ref 11.1–15.9)
MCH: 28.8 pg (ref 26.6–33.0)
MCHC: 31.3 g/dL — ABNORMAL LOW (ref 31.5–35.7)
MCV: 92 fL (ref 79–97)
Platelets: 180 x10E3/uL (ref 150–450)
RBC: 4.55 x10E6/uL (ref 3.77–5.28)
RDW: 13.7 % (ref 11.7–15.4)
WBC: 8.6 x10E3/uL (ref 3.4–10.8)

## 2024-06-28 LAB — PROTIME-INR
INR: 1 (ref 0.9–1.2)
Prothrombin Time: 10.6 s (ref 9.1–12.0)

## 2024-06-28 LAB — BRAIN NATRIURETIC PEPTIDE: BNP: 75.4 pg/mL (ref 0.0–100.0)

## 2024-06-29 ENCOUNTER — Telehealth (HOSPITAL_COMMUNITY): Payer: Self-pay | Admitting: Licensed Clinical Social Worker

## 2024-06-29 ENCOUNTER — Other Ambulatory Visit: Payer: Self-pay

## 2024-06-29 ENCOUNTER — Ambulatory Visit: Payer: Self-pay | Admitting: Medical

## 2024-06-29 NOTE — Telephone Encounter (Signed)
 CSW consulted to speak with pt regarding housing security and recent loss of employment.  CSW attempted to call pt but unable to reach- left VM requesting return call  Andriette HILARIO Leech, LCSW Clinical Social Worker [facility] Desk#: [phone] Cell#: [phone]

## 2024-07-22 ENCOUNTER — Telehealth: Payer: Self-pay | Admitting: Family

## 2024-07-30 ENCOUNTER — Telehealth (HOSPITAL_COMMUNITY): Payer: Self-pay | Admitting: Licensed Clinical Social Worker

## 2024-07-30 NOTE — Telephone Encounter (Signed)
 CSW called pt to discuss housing insecurity and financial concerns- unable to reach- left VM requesting return call  Andriette HILARIO Leech, LCSW Clinical Social Worker [facility] Desk#: [phone] Cell#: [phone]

## 2024-08-07 ENCOUNTER — Other Ambulatory Visit: Payer: Self-pay

## 2024-08-07 ENCOUNTER — Ambulatory Visit: Payer: Self-pay | Admitting: Nurse Practitioner

## 2024-08-07 ENCOUNTER — Ambulatory Visit (INDEPENDENT_AMBULATORY_CARE_PROVIDER_SITE_OTHER): Payer: MEDICAID | Admitting: Nurse Practitioner

## 2024-08-07 ENCOUNTER — Other Ambulatory Visit: Payer: Self-pay | Admitting: Internal Medicine

## 2024-08-07 ENCOUNTER — Encounter: Payer: Self-pay | Admitting: Nurse Practitioner

## 2024-08-07 ENCOUNTER — Ambulatory Visit: Payer: Self-pay

## 2024-08-07 ENCOUNTER — Ambulatory Visit
Admission: RE | Admit: 2024-08-07 | Discharge: 2024-08-07 | Disposition: A | Discharge status: Home / Self Care | Payer: MEDICAID | Source: Ambulatory Visit | Attending: Nurse Practitioner | Admitting: Nurse Practitioner

## 2024-08-07 VITALS — BP 136/88 | HR 78 | Temp 98.0°F | Ht 67.0 in | Wt 227.0 lb

## 2024-08-07 DIAGNOSIS — M79662 Pain in left lower leg: Secondary | ICD-10-CM

## 2024-08-07 DIAGNOSIS — I480 Paroxysmal atrial fibrillation: Secondary | ICD-10-CM

## 2024-08-07 DIAGNOSIS — Z952 Presence of prosthetic heart valve: Secondary | ICD-10-CM

## 2024-08-07 NOTE — Progress Notes (Signed)
 "  BP 136/88   Pulse 78   Temp 98 F (36.7 C)   Ht 5' 7 (1.702 m)   Wt 227 lb (103 kg)   SpO2 99%   BMI 35.55 kg/m    Subjective:    Patient ID: Sandra Hines, female    DOB: [birth year], 54 y.o.   MRN: [id number]  HPI: Sandra Hines is a 54 y.o. female  Chief Complaint  Patient presents with   Leg Pain    Pt c/o left leg pain x10 days.    Discussed the use of AI scribe software for clinical note transcription with the patient, who gave verbal consent to proceed.  History of Present Illness Sandra Hines is a 54 year old female with atrial fibrillation who presents with left lower leg pain.  Left lower extremity pain - Intermittent pain in the left lower leg for approximately ten days - Pain described as a sensation of the leg 'falling asleep' and attempts to 'wake it up' - No associated swelling - No recent travel -calf tenderness  Atrial fibrillation.mechanical MVR and anticoagulation - History of atrial fibrillation - Previously treated with warfarin - Currently not taking warfarin  due to logistical issues with healthcare access According to cardiology note on 06/26/2024 :  S/p mechanical mitral valve repair in 09/2023. Normally functioning device by echo in June 2025. She has not been compliant with warfarin. Restart warfarin and refer back to coumadin  clinic. INR today. INR goal 2.5-3.5. Continue ASA.    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 due to housing instability         04/09/2024    9:20 AM 01/21/2024    8:17 AM 11/28/2023    9:58 AM  Depression screen PHQ 2/9  Decreased Interest 3 3 1   Down, Depressed, Hopeless 3 3 3   PHQ - 2 Score 6 6 4   Altered sleeping 3 3 3   Tired, decreased energy 3 2 3   Change in appetite 3 2 3   Feeling bad or failure about yourself  3 3 2   Trouble  concentrating 2 3 2   Moving slowly or fidgety/restless 1 0 0  Suicidal thoughts 1 1 3   PHQ-9 Score 22  20  20    Difficult doing work/chores Very difficult Very difficult Extremely dIfficult     Data saved with a previous flowsheet row definition    Relevant past medical, surgical, family and social history reviewed and updated as indicated. Interim medical history since our last visit reviewed. Allergies and medications reviewed and updated.  Review of Systems  Ten systems reviewed and is negative except as mentioned in HPI      Objective:      BP 136/88   Pulse 78   Temp 98 F (36.7 C)   Ht 5' 7 (1.702 m)   Wt 227 lb (103 kg)   SpO2 99%   BMI 35.55 kg/m    Wt Readings from Last 3 Encounters:  08/07/24 227 lb (103 kg)  06/26/24 230 lb 6.4 oz (104.5 kg)  04/09/24 221 lb 9.6 oz (100.5 kg)    Physical Exam GENERAL: Alert, cooperative, well developed, no acute distress. HEENT: Normocephalic, normal oropharynx, moist mucous membranes. CHEST: Clear to auscultation bilaterally. No wheezes, rhonchi, or crackles. CARDIOVASCULAR: Normal heart rate and rhythm. S1 and S2 normal without murmurs. ABDOMEN: Soft, non-tender, non-distended, without  organomegaly. Normal bowel sounds. EXTREMITIES: No cyanosis or edema. Left calf tenderness on palpation. Positive Homan's sign on the left leg. NEUROLOGICAL: Cranial nerves grossly intact. Moves all extremities without gross motor or sensory deficit.  Results for orders placed or performed in visit on 06/26/24  Protime-INR   Collection Time: 06/26/24 10:30 AM  Result Value Ref Range   INR 1.0 0.9 - 1.2   Prothrombin Time 10.6 9.1 - 12.0 sec  Basic metabolic panel with GFR   Collection Time: 06/26/24 10:30 AM  Result Value Ref Range   Glucose 136 (H) 70 - 99 mg/dL   BUN 17 6 - 24 mg/dL   Creatinine, Ser 8.51 (H) 0.57 - 1.00 mg/dL   eGFR 43 (L) >40 fO/fpw/8.26   BUN/Creatinine Ratio 11 9 - 23   Sodium 141 134 - 144 mmol/L    Potassium 4.0 3.5 - 5.2 mmol/L   Chloride 101 96 - 106 mmol/L   CO2 23 20 - 29 mmol/L   Calcium  8.9 8.7 - 10.2 mg/dL  CBC   Collection Time: 06/26/24 10:30 AM  Result Value Ref Range   WBC 8.6 3.4 - 10.8 x10E3/uL   RBC 4.55 3.77 - 5.28 x10E6/uL   Hemoglobin 13.1 11.1 - 15.9 g/dL   Hematocrit 58.1 65.9 - 46.6 %   MCV 92 79 - 97 fL   MCH 28.8 26.6 - 33.0 pg   MCHC 31.3 (L) 31.5 - 35.7 g/dL   RDW 86.2 88.2 - 84.5 %   Platelets 180 150 - 450 x10E3/uL  Brain natriuretic peptide   Collection Time: 06/26/24 10:30 AM  Result Value Ref Range   BNP 75.4 0.0 - 100.0 pg/mL          Assessment & Plan:   Problem List Items Addressed This Visit       Cardiovascular and Mediastinum   A-fib (HCC)     Other   S/P MVR (mitral valve replacement)   Other Visit Diagnoses       Pain in left lower leg    -  Primary   Relevant Orders   US  Venous Img Lower Unilateral Left (DVT)        Assessment and Plan Assessment & Plan Suspected deep vein thrombosis of the left lower extremity Intermittent left lower leg pain for approximately a week and a half, with calf tenderness upon palpation. Positive Homan sign. High suspicion for DVT due to atrial fibrillation, mechanical heart valve, and recent discontinuation of anticoagulation therapy. No recent travel, swelling, chest pain, or shortness of breath reported. - Ordered stat ultrasound of the left lower extremity to rule out DVT  Atrial fibrillation/mechanical heart valve Increased risk for thromboembolic events. Currently not on anticoagulation therapy due to logistical issues with medication access and follow-up with the Coumadin  clinic.  Presence of mechanical heart valve Increased risk for thromboembolic events. Currently not on anticoagulation therapy due to logistical issues with medication access and follow-up with the Coumadin  clinic.  Patient will need to wait at imaging center till resulted, if positive for DVT will refer to  ER        Follow up plan: Return if symptoms worsen or fail to improve. "

## 2024-08-07 NOTE — Telephone Encounter (Signed)
" °  FYI Only or Action Required?: FYI only for provider: appointment scheduled on 1/16.  Patient was last seen in primary care on 04/09/2024 by Sharma Coyer, MD.  Called Nurse Triage reporting Leg Pain.  Symptoms began a week ago.  Interventions attempted: OTC medications: tylenol .  Symptoms are: gradually worsening.  Triage Disposition: See PCP When Office is Open (Within 3 Days)  Patient/caregiver understands and will follow disposition?:    Reason for Triage: bones aching, feeling numb in her bones.. especially left leg and left ankle for the last week.  phases up depression- since thanksgiving.. headaches are worsening  Reason for Disposition  [1] MODERATE pain (e.g., interferes with normal activities, limping) AND [2] present > 3 days  Answer Assessment - Initial Assessment Questions 1. ONSET: When did the pain start?      A week ago 2. LOCATION: Where is the pain located?      Left ankle 3. PAIN: How bad is the pain?    (Scale 1-10; or mild, moderate, severe)     moderate 4. WORK OR EXERCISE: Has there been any recent work or exercise that involved this part of the body?      NO 5. CAUSE: What do you think is causing the leg pain?     unknown 6. OTHER SYMPTOMS: Do you have any other symptoms? (e.g., chest pain, back pain, breathing difficulty, swelling, rash, fever, numbness, weakness)     No  Protocols used: Leg Pain-A-AH  "

## 2024-08-08 ENCOUNTER — Other Ambulatory Visit: Payer: Self-pay

## 2024-08-08 ENCOUNTER — Other Ambulatory Visit: Payer: Self-pay | Admitting: Medical

## 2024-08-10 ENCOUNTER — Other Ambulatory Visit: Payer: Self-pay

## 2024-08-11 ENCOUNTER — Telehealth: Payer: Self-pay | Admitting: Family

## 2024-08-11 NOTE — Telephone Encounter (Signed)
 Called to confirm/remind patient of their appointment at the [facility] on 08/12/24.   Appointment:   [] Confirmed  [x] Left mess   [] No answer/No voice mail  [] VM Full/unable to leave message  [] Phone not in service  Patient reminded to bring all medications and/or complete list.  Confirmed patient has transportation. Gave directions, instructed to utilize valet parking.

## 2024-08-12 ENCOUNTER — Other Ambulatory Visit: Payer: Self-pay

## 2024-08-12 ENCOUNTER — Ambulatory Visit: Attending: Family | Admitting: Family

## 2024-08-12 ENCOUNTER — Other Ambulatory Visit (HOSPITAL_COMMUNITY): Payer: Self-pay

## 2024-08-12 ENCOUNTER — Encounter: Payer: Self-pay | Admitting: Family

## 2024-08-12 VITALS — BP 122/83 | HR 83 | Wt 225.0 lb

## 2024-08-12 DIAGNOSIS — I4892 Unspecified atrial flutter: Secondary | ICD-10-CM | POA: Diagnosis not present

## 2024-08-12 DIAGNOSIS — R5383 Other fatigue: Secondary | ICD-10-CM | POA: Insufficient documentation

## 2024-08-12 DIAGNOSIS — M898X9 Other specified disorders of bone, unspecified site: Secondary | ICD-10-CM | POA: Insufficient documentation

## 2024-08-12 DIAGNOSIS — E66812 Obesity, class 2: Secondary | ICD-10-CM | POA: Diagnosis not present

## 2024-08-12 DIAGNOSIS — E875 Hyperkalemia: Secondary | ICD-10-CM | POA: Diagnosis not present

## 2024-08-12 DIAGNOSIS — Z5900 Homelessness unspecified: Secondary | ICD-10-CM | POA: Diagnosis not present

## 2024-08-12 DIAGNOSIS — I340 Nonrheumatic mitral (valve) insufficiency: Secondary | ICD-10-CM | POA: Diagnosis not present

## 2024-08-12 DIAGNOSIS — I5022 Chronic systolic (congestive) heart failure: Secondary | ICD-10-CM

## 2024-08-12 DIAGNOSIS — I480 Paroxysmal atrial fibrillation: Secondary | ICD-10-CM | POA: Insufficient documentation

## 2024-08-12 DIAGNOSIS — F17210 Nicotine dependence, cigarettes, uncomplicated: Secondary | ICD-10-CM | POA: Insufficient documentation

## 2024-08-12 DIAGNOSIS — Z952 Presence of prosthetic heart valve: Secondary | ICD-10-CM

## 2024-08-12 DIAGNOSIS — I110 Hypertensive heart disease with heart failure: Secondary | ICD-10-CM | POA: Insufficient documentation

## 2024-08-12 DIAGNOSIS — Z7982 Long term (current) use of aspirin: Secondary | ICD-10-CM | POA: Insufficient documentation

## 2024-08-12 DIAGNOSIS — Z01810 Encounter for preprocedural cardiovascular examination: Secondary | ICD-10-CM | POA: Insufficient documentation

## 2024-08-12 DIAGNOSIS — R791 Abnormal coagulation profile: Secondary | ICD-10-CM | POA: Diagnosis not present

## 2024-08-12 DIAGNOSIS — Z79899 Other long term (current) drug therapy: Secondary | ICD-10-CM | POA: Diagnosis not present

## 2024-08-12 DIAGNOSIS — E871 Hypo-osmolality and hyponatremia: Secondary | ICD-10-CM | POA: Diagnosis not present

## 2024-08-12 DIAGNOSIS — E785 Hyperlipidemia, unspecified: Secondary | ICD-10-CM | POA: Insufficient documentation

## 2024-08-12 DIAGNOSIS — R531 Weakness: Secondary | ICD-10-CM | POA: Insufficient documentation

## 2024-08-12 DIAGNOSIS — Z6835 Body mass index (BMI) 35.0-35.9, adult: Secondary | ICD-10-CM

## 2024-08-12 DIAGNOSIS — E782 Mixed hyperlipidemia: Secondary | ICD-10-CM | POA: Diagnosis not present

## 2024-08-12 DIAGNOSIS — Z59868 Other specified financial insecurity: Secondary | ICD-10-CM | POA: Insufficient documentation

## 2024-08-12 DIAGNOSIS — Z5982 Transportation insecurity: Secondary | ICD-10-CM | POA: Insufficient documentation

## 2024-08-12 DIAGNOSIS — Z7984 Long term (current) use of oral hypoglycemic drugs: Secondary | ICD-10-CM | POA: Diagnosis not present

## 2024-08-12 DIAGNOSIS — Z7901 Long term (current) use of anticoagulants: Secondary | ICD-10-CM | POA: Diagnosis not present

## 2024-08-12 DIAGNOSIS — R002 Palpitations: Secondary | ICD-10-CM | POA: Insufficient documentation

## 2024-08-12 DIAGNOSIS — I455 Other specified heart block: Secondary | ICD-10-CM | POA: Diagnosis not present

## 2024-08-12 DIAGNOSIS — J811 Chronic pulmonary edema: Secondary | ICD-10-CM | POA: Insufficient documentation

## 2024-08-12 DIAGNOSIS — R9431 Abnormal electrocardiogram [ECG] [EKG]: Secondary | ICD-10-CM | POA: Insufficient documentation

## 2024-08-12 DIAGNOSIS — G479 Sleep disorder, unspecified: Secondary | ICD-10-CM | POA: Insufficient documentation

## 2024-08-12 MED ORDER — SACUBITRIL-VALSARTAN 24-26 MG PO TABS
1.0000 | ORAL_TABLET | Freq: Two times a day (BID) | ORAL | 1 refills | Status: AC
  Filled 2024-08-12: qty 180, 90d supply, fill #0

## 2024-08-12 MED ORDER — SPIRONOLACTONE 25 MG PO TABS
12.5000 mg | ORAL_TABLET | Freq: Every day | ORAL | 1 refills | Status: AC
  Filled 2024-08-12: qty 45, 90d supply, fill #0

## 2024-08-12 MED ORDER — CARVEDILOL 3.125 MG PO TABS
3.1250 mg | ORAL_TABLET | Freq: Two times a day (BID) | ORAL | 1 refills | Status: AC
  Filled 2024-08-12: qty 180, 90d supply, fill #0

## 2024-08-12 MED ORDER — TORSEMIDE 20 MG PO TABS
20.0000 mg | ORAL_TABLET | Freq: Every day | ORAL | 1 refills | Status: AC
  Filled 2024-08-12: qty 90, 90d supply, fill #0

## 2024-08-12 MED ORDER — ASPIRIN 81 MG PO CHEW
81.0000 mg | CHEWABLE_TABLET | Freq: Every day | ORAL | 1 refills | Status: AC
  Filled 2024-08-12: qty 90, 90d supply, fill #0

## 2024-08-12 MED ORDER — AMIODARONE HCL 200 MG PO TABS
200.0000 mg | ORAL_TABLET | Freq: Two times a day (BID) | ORAL | 1 refills | Status: AC
  Filled 2024-08-12: qty 180, 90d supply, fill #0

## 2024-08-12 MED ORDER — EMPAGLIFLOZIN 10 MG PO TABS
10.0000 mg | ORAL_TABLET | Freq: Every day | ORAL | 1 refills | Status: AC
  Filled 2024-08-12: qty 90, 90d supply, fill #0

## 2024-08-12 MED ORDER — ROSUVASTATIN CALCIUM 40 MG PO TABS
40.0000 mg | ORAL_TABLET | Freq: Every day | ORAL | 1 refills | Status: AC
  Filled 2024-08-12: qty 90, 90d supply, fill #0

## 2024-08-12 NOTE — Progress Notes (Signed)
 "  ADVANCED HF CLINIC  NOTE  Referring Physician: Sharma Coyer, MD Primary Care: Sharma Coyer, MD Primary Cardiologist: Evalene Lunger, MD  Chief Complaint: fatigue   HPI:  Ms Sandra Hines is a 54 y.o. 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     Admitted to [facility] 09/25/23 with A fib RVR. Echo showed LVEF ef 45-50% severe mitral regurgitation mean MV gradient 11 and mitral valve rheumatic.  TEE 2025 EF 55-60% RV low normal. LHC/RHC no CAD, CO 4.7 and CI 2.3 .Transferred to [facility] for CT surgery consultation. She underwent mechanical mitral valve with MAZE. Developed pauses and sinus arrest post cardioversion. GDMT limited by hyperkalemia. Placed on coumadin . Lasix  stopped on the day of discharge due to hyponatremia. Discharged 10/14/23.    After discharge she developed lower extremity edema and weight gain.    Presented to [facility]  10/19/23 with chest pain. CXR small pleural effusion and edema. CT chest - Small-mod left pleural effusion. Possible PNA or inflammation with post surgical changes from open heart surgery.  Bld CX NGTD, respiratory panel negative, BNP 1629, HS Trop 408>490,  WBC 25. Placed on antibiotics for possible PNA. Diuresing with IV lasix .   Echo 4/25  EF down 25-30% RV severely reduced. Diuresed well and discharged home on 10/22/23. Was placed on therapeutic dose lovenox  on d/c due to sub therapeutic INR.   Echo 12/30/23: EF 40-45%, normal RV, mechanical mitral valve functioning well, mild dilation ascending aorta 40mm  Admitted 04/04/24 with chest pain / SOB. Had not been taking cardiac meds for several weeks due to insurance reasons. Found to be in AV RVR. EKG showed narrow complex tachycardia, chest x-ray without any consolidation, CTA chest without PE. Cardiology consulted. IV amiodarone  started and converted back to NSR so switched to oral amiodarone  and carvedilol  was resumed.   She  presents today for a HF follow-up visit with a chief complaint of moderate fatigue. Has associated difficulty sleeping, bone pain, leg weakness, minimal shortness of breath, occasional palpitations, occasional dizziness. Had recent leg ultrasound that ruled out a blood clot. Denies chest pain, pedal edema. Currently homeless and she and her 3 kids are staying at night time in a friend's office space. Was staying at a motel until she ran out of money.   Says that she's been out of jardiance  for ~ 3-4 as the pharmacy told her she would have to pay the cash price which was >$600. She has been out of warfarin for ~ 1 month and has missed coumadin  clinic appointments. Had a mechanical mitral valve with MAZE 03/25.    Past Medical History:  Diagnosis Date   Cerebral aneurysm    a. 2019 s/p repair ([facility]).   Depression    Heart failure with mid-range ejection fraction (HCC)    a. 09/2023 Echo: EF 55-60%; b. 09/2023 Echo (post-op MVR): EF 25-30%; c. 12/2023 Echo: EF 40-45%, glob HK, nl RV fxn, nl fxn'ing MV prosthesis, Asc Ao 40mm.   Hypertension    Mitral regurgitation    a. 09/2023 Echo: Severe MR; b. 09/2023 s/p a 29 mm SJM Prosthesis, PVI MAZE w/ occlusion of the LAA with a 50 mm Medtronic clip; b. 12/2023 Echo: No MR.   Morbid obesity (HCC)    NICM (nonischemic cardiomyopathy) (HCC)    a. 09/2023 Echo: EF 55-60%, severe MR; b. 09/2023 Cath: Nl cors; c. 09/2023 Echo (post-op MVR): EF 25-30%; d. 12/2023 Echo: EF  40-45%, glob HK, nl RV fxn, nl fxn'ing MV prosthesis, Asc Ao 40mm.   PAF (paroxysmal atrial fibrillation) (HCC)    a. Dx 09/2023.  CHA2DS2VASc = 3-->warfarin; b. On amio; c. 10/2023 DCCV (150J).   Tobacco abuse     Current Outpatient Medications  Medication Sig Dispense Refill   acetaminophen  (TYLENOL ) 325 MG tablet Take 2 tablets (650 mg total) by mouth every 4 (four) hours as needed for headache or mild pain (pain score 1-3).     ALPRAZolam  (XANAX ) 0.5 MG tablet Take 1 tablet (0.5 mg total) by  mouth 2 (two) times daily as needed for anxiety. (Patient not taking: Reported on 08/07/2024) 60 tablet 2   amiodarone  (PACERONE ) 200 MG tablet Take 1 tablet (200 mg total) by mouth 2 (two) times daily. 180 tablet 3   aspirin  81 MG chewable tablet Chew 1 tablet (81 mg total) by mouth daily.     busPIRone  (BUSPAR ) 7.5 MG tablet Take 1 tablet (7.5 mg total) by mouth 2 (two) times daily. 120 tablet 1   carvedilol  (COREG ) 3.125 MG tablet Take 1 tablet (3.125 mg total) by mouth 2 (two) times daily with a meal. 90 tablet 3   cyclobenzaprine  (FLEXERIL ) 5 MG tablet Take 1 tablet (5 mg total) by mouth 3 (three) times daily as needed for muscle spasms. 90 tablet 1   DULoxetine  40 MG CPEP Take 1 capsule (40 mg total) by mouth daily.     empagliflozin  (JARDIANCE ) 10 MG TABS tablet Take 1 tablet (10 mg total) by mouth daily before breakfast. 90 tablet 3   nicotine  (NICODERM CQ  - DOSED IN MG/24 HR) 7 mg/24hr patch Place 1 patch (7 mg total) onto the skin daily. 60 patch 4   ondansetron  (ZOFRAN ) 4 MG tablet Take 1 tablet (4 mg total) by mouth every 8 (eight) hours as needed for nausea or vomiting. 30 tablet 0   polyethylene glycol powder (GLYCOLAX /MIRALAX ) 17 GM/SCOOP powder Take 17 g by mouth daily. Mix as directed. 238 g 0   rosuvastatin  (CRESTOR ) 40 MG tablet Take 1 tablet (40 mg total) by mouth daily. 90 tablet 1   sacubitril -valsartan  (ENTRESTO ) 24-26 MG Take 1 tablet by mouth 2 (two) times daily. 180 tablet 3   spironolactone  (ALDACTONE ) 25 MG tablet Take 0.5 tablets (12.5 mg total) by mouth daily. 45 tablet 3   SUMAtriptan  (IMITREX ) 25 MG tablet Take 1 tablet (25 mg total) by mouth every 2 (two) hours as needed for headache. May repeat in 2 hours if headache persists or recurs. 10 tablet 0   torsemide  (DEMADEX ) 20 MG tablet Take 1 tablet (20 mg total) by mouth daily. 90 tablet 3   triamcinolone  (KENALOG ) 0.025 % ointment Apply 1 Application topically 2 (two) times daily. 30 g 0   warfarin (COUMADIN ) 2.5 MG  tablet Take 2 tablets by mouth daily or as directed by coumadin  clinic PLEASE SCHEDULE APPOINTMENT FOR MORE REFILLS (Patient not taking: Reported on 08/07/2024) 60 tablet 0   warfarin (COUMADIN ) 5 MG tablet Take 1 tablet (5 mg total) by mouth daily as directed. (Patient not taking: Reported on 08/07/2024) 5 tablet 0   No current facility-administered medications for this visit.    Allergies  Allergen Reactions   Fentanyl  Itching   Penicillins Hives           Social History   Socioeconomic History   Marital status: Single    Spouse name: Not on file   Number of children: 8   Years of education:  Not on file   Highest education level: Some college, no degree  Occupational History   Not on file  Tobacco Use   Smoking status: Some Days    Current packs/day: 1.00    Types: Cigarettes   Smokeless tobacco: Never   Tobacco comments:    Was smoking regularly prior to surgery in March but since then, has smoked about 1 pack of cigarettes.  Vaping Use   Vaping status: Never Used  Substance and Sexual Activity   Alcohol use: Yes    Alcohol/week: 2.0 standard drinks of alcohol    Types: 2 Shots of liquor per week    Comment: A few shots of liquor on the weekends.   Drug use: Yes    Types: Marijuana    Comment: Occasionally smokes marijuana when anxiety is overwhelming.   Sexual activity: Yes    Birth control/protection: None, Condom  Other Topics Concern   Not on file  Social History Narrative   Lives locally with children.  Has 8 children ranging in age from 16-30.  Does not routine exercise.   Social Drivers of Health   Tobacco Use: High Risk (08/07/2024)   Patient History    Smoking Tobacco Use: Some Days    Smokeless Tobacco Use: Never    Passive Exposure: Not on file  Financial Resource Strain: Medium Risk (09/30/2023)   Overall Financial Resource Strain (CARDIA)    Difficulty of Paying Living Expenses: Somewhat hard  Food Insecurity: No Food Insecurity (04/04/2024)    Epic    Worried About Programme Researcher, Broadcasting/film/video in the Last Year: Never true    Ran Out of Food in the Last Year: Never true  Transportation Needs: Unmet Transportation Needs (04/04/2024)   Epic    Lack of Transportation (Medical): Yes    Lack of Transportation (Non-Medical): No  Physical Activity: Not on file  Stress: Not on file  Social Connections: Moderately Integrated (10/20/2023)   Social Connection and Isolation Panel    Frequency of Communication with Friends and Family: Three times a week    Frequency of Social Gatherings with Friends and Family: Once a week    Attends Religious Services: 1 to 4 times per year    Active Member of [employer] or Organizations: Yes    Attends Banker Meetings: 1 to 4 times per year    Marital Status: Never married  Intimate Partner Violence: Not At Risk (04/04/2024)   Epic    Fear of Current or Ex-Partner: No    Emotionally Abused: No    Physically Abused: No    Sexually Abused: No  Depression (PHQ2-9): High Risk (04/09/2024)   Depression (PHQ2-9)    PHQ-2 Score: 22  Alcohol Screen: Not on file  Housing: Low Risk (04/04/2024)   Epic    Unable to Pay for Housing in the Last Year: No    Number of Times Moved in the Last Year: 0    Homeless in the Last Year: No  Utilities: Not At Risk (04/04/2024)   Epic    Threatened with loss of utilities: No  Health Literacy: Not on file     No family history on file.  Vitals:   08/12/24 1514  BP: 122/83  Pulse: 83  SpO2: 97%  Weight: 225 lb (102.1 kg)   Wt Readings from Last 3 Encounters:  08/12/24 225 lb (102.1 kg)  08/07/24 227 lb (103 kg)  06/26/24 230 lb 6.4 oz (104.5 kg)   Lab Results  Component  Value Date   CREATININE 1.48 (H) 06/26/2024   CREATININE 1.27 (H) 04/09/2024   CREATININE 0.96 04/05/2024     PHYSICAL EXAM: General: Tired appearing obese female. NAD  Cor: No JVD. Regular rhythm, rate. Mechanical s1 Lungs: clear Abdomen: soft, nontender, nondistended. Extremities: no  edema Neuro:. Affect pleasant   ECG:  NSR, prolonged chronic QTc (personally reviewed)    ASSESSMENT & PLAN:  1. Chronic systolic CHF: S/p mechanical MV replacement.  Pre-op echo with EF 45-50%.  Cath pre-op with no significant CAD. Post-op echo this 4/25  EF 25-30%, mild LV dilation, moderate LVH, mild RV enlargement with mildly decreased systolic function, mechanical MV with mean gradient 11 mmHg, ?mild-moderate AI, IVC not dilated. Echo 12/30/23: EF 40-45%, normal RV, mechanical mitral valve functioning well, mild dilation ascending aorta 40mm.  - NYHA III in setting of recurrent AFL - Euvolemic today - Continue Coreg  3.125 mg bid - Resume Jardiance  10 mg daily. Refilled today  - Continue Entresto  24/26 bid.  - Continue spironolactone  12.5 mg daily.  - Continue torsemide  20 mg daily.  - BMET 06/26/24 reviewed: K 4.0, creatinine 1.48, GFR 43, BNP 75.4, Hg 13.1  2. 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  Patient had mechanical MV replacement with Maze and LA appendage occlusion on 10/07/23. - Has been off warfarin for ~ 1 month, says that she needs a new RX. Message sent to C. Furth in cardiology who saw her a month ago so that she can resume warfarin. Patient is aware that she's missed coumadin  clinic appointment. Currently homeless and living in a friends' office space with her 3 kids.  - INR 06/26/24 was 1.0 - Continue ASA 81 daily.  - Endocarditis prophylaxis with dentistry.   3. Atrial fibrillation/flutter: Paroxysmal.  She had Maze procedure with her surgery. - EKG today is NSR - Continue amio 200 bid - DCCV done 04/25 - will need yearly eye exam if possible - TSH 04/09/24 was 0.263. Follows with PCP  4. Morbid obesity - Consider GLP1RA in the future but she's currently unable to afford this - BMI 35.24  5. HLD - continue rosuvastatin  40mg  daily - LDL 09/26/23 was 118   Return in 2 months to see HF MD, sooner if needed.   I spent 40 minutes reviewing records, interviewing/  examing patient and managing plan/ orders.    Ellouise DELENA Class, FNP  08/12/24  "

## 2024-08-12 NOTE — Patient Instructions (Signed)
 Medication Changes:  REFILLED heart medications to [facility] pharmacy    Follow-Up in: Please follow up with the [facility] in 2 months with Dr. Rolan.   Thank you for choosing [facility].    At the [facility], you and your health needs are our priority. We have a designated team specialized in the treatment of Heart Failure. This Care Team includes your primary Heart Failure Specialized Cardiologist (physician), Advanced Practice Providers (APPs- Physician Assistants and Nurse Practitioners), and Pharmacist who all work together to provide you with the care you need, when you need it.   You may see any of the following providers on your designated Care Team at your next follow up:  Dr. Toribio Fuel Dr. Ezra Rolan Dr. Ria Commander Dr. Morene Brownie Ellouise Class, FNP Jaun Bash, RPH-CPP  Please be sure to bring in all your medications bottles to every appointment.   Need to Contact Us :  If you have any questions or concerns before your next appointment please send us  a message through [facility] or call our office at [phone].    TO LEAVE A MESSAGE FOR THE NURSE SELECT OPTION 2, PLEASE LEAVE A MESSAGE INCLUDING: YOUR NAME DATE OF BIRTH CALL BACK NUMBER REASON FOR CALL**this is important as we prioritize the call backs  YOU WILL RECEIVE A CALL BACK THE SAME DAY AS LONG AS YOU CALL BEFORE 4:00 PM

## 2024-08-13 ENCOUNTER — Telehealth: Payer: Self-pay

## 2024-08-13 ENCOUNTER — Other Ambulatory Visit: Payer: Self-pay

## 2024-08-13 ENCOUNTER — Other Ambulatory Visit: Payer: Self-pay | Admitting: Medical

## 2024-08-13 MED FILL — Warfarin Sodium Tab 5 MG: ORAL | 15 days supply | Qty: 15 | Fill #0 | Status: CN

## 2024-08-13 NOTE — Telephone Encounter (Signed)
 Lpmtcb to discuss Coumadin  refill and schedule INR appt in [place].

## 2024-08-14 ENCOUNTER — Other Ambulatory Visit: Payer: Self-pay

## 2024-08-18 ENCOUNTER — Other Ambulatory Visit: Payer: Self-pay

## 2024-08-18 ENCOUNTER — Encounter: Payer: Self-pay | Admitting: Family Medicine

## 2024-08-18 ENCOUNTER — Ambulatory Visit: Payer: MEDICAID | Admitting: Family Medicine

## 2024-08-18 VITALS — BP 125/85 | HR 85 | Temp 97.7°F | Ht 67.0 in | Wt 227.0 lb

## 2024-08-18 DIAGNOSIS — I252 Old myocardial infarction: Secondary | ICD-10-CM

## 2024-08-18 DIAGNOSIS — E785 Hyperlipidemia, unspecified: Secondary | ICD-10-CM | POA: Diagnosis not present

## 2024-08-18 DIAGNOSIS — G4489 Other headache syndrome: Secondary | ICD-10-CM

## 2024-08-18 DIAGNOSIS — G44229 Chronic tension-type headache, not intractable: Secondary | ICD-10-CM

## 2024-08-18 DIAGNOSIS — Z952 Presence of prosthetic heart valve: Secondary | ICD-10-CM

## 2024-08-18 DIAGNOSIS — F321 Major depressive disorder, single episode, moderate: Secondary | ICD-10-CM | POA: Diagnosis not present

## 2024-08-18 DIAGNOSIS — F5104 Psychophysiologic insomnia: Secondary | ICD-10-CM | POA: Diagnosis not present

## 2024-08-18 DIAGNOSIS — I10 Essential (primary) hypertension: Secondary | ICD-10-CM

## 2024-08-18 DIAGNOSIS — Z59819 Housing instability, housed unspecified: Secondary | ICD-10-CM

## 2024-08-18 DIAGNOSIS — I480 Paroxysmal atrial fibrillation: Secondary | ICD-10-CM

## 2024-08-18 DIAGNOSIS — I5022 Chronic systolic (congestive) heart failure: Secondary | ICD-10-CM | POA: Diagnosis not present

## 2024-08-18 DIAGNOSIS — F411 Generalized anxiety disorder: Secondary | ICD-10-CM | POA: Diagnosis not present

## 2024-08-18 MED ORDER — BUSPIRONE HCL 7.5 MG PO TABS: 7.5000 mg | ORAL_TABLET | Freq: Two times a day (BID) | ORAL | 1 refills | Status: AC

## 2024-08-18 MED ORDER — SUMATRIPTAN SUCCINATE 25 MG PO TABS: 25.0000 mg | ORAL_TABLET | ORAL | 2 refills | Status: AC | PRN

## 2024-08-18 MED ORDER — ALPRAZOLAM 0.5 MG PO TABS: 0.5000 mg | ORAL_TABLET | Freq: Two times a day (BID) | ORAL | 2 refills | Status: AC | PRN

## 2024-08-18 MED ORDER — WARFARIN SODIUM 2.5 MG PO TABS: ORAL_TABLET | ORAL | 0 refills | Status: AC

## 2024-08-18 MED ORDER — DULOXETINE HCL 40 MG PO CPEP: 40.0000 mg | ORAL_CAPSULE | Freq: Every day | ORAL | 6 refills | Status: AC

## 2024-08-18 NOTE — Assessment & Plan Note (Signed)
 Major depressive disorder, single episode, moderate Chronic condition with a PHQ-9 score of 23, indicating moderate severity. Recent stressors include housing insecurity and denial of disability benefits. - Continue duloxetine  40 mg daily

## 2024-08-18 NOTE — Progress Notes (Signed)
 "  Established Patient Office Visit  Patient ID: AVERLY Hines, female    DOB: Dec 18, 1970  Age: 54 y.o. MRN: [id number] PCP: Sharma Coyer, MD  Chief Complaint  Patient presents with   Medication Refill    Patient in need of refills for    Referral    Referral sent by PA in [facility] ED for housing insecurity. Checking in with Sandra Hines on status     Subjective:     HPI  Discussed the use of AI scribe software for clinical note transcription with the patient, who gave verbal consent to proceed.  History of Present Illness Sandra Hines is a 54 year old female who presents for medication refills. She is accompanied by her children, including her eleven-year-old daughter Sandra Hines and ten-year-old daughter Sandra Hines.  She is experiencing housing insecurity, currently staying at her brother's club after being evicted from her previous residence. This situation has impacted her ability to maintain a stable environment for her family, including her children. She has been denied disability benefits and is receiving some assistance from a church.  She has chronic headaches and uses Imitrex  25 mg as needed. She has a history of anxiety and is currently taking buspirone  7.5 mg twice daily, duloxetine  40 mg daily, and Xanax  0.5 mg twice a day as needed. Her PHQ-9 score for depression is 23, up from 22, and her GAD-7 score for anxiety is 19, up from 18.  She has a history of atrial fibrillation and is on amiodarone  200 mg twice daily. She has been off warfarin for a month due to a prescription issue. She continues to follow up with cardiology.  She has chronic heart failure with reduced ejection fraction and is on carvedilol  3.125 mg twice daily, Jardiance  10 mg daily, Entresto  24/26 mg twice daily, spironolactone  12.5 mg daily, and torsemide  20 mg daily.  She has hyperlipidemia and is on Crestor  40 mg daily. She also has chronic hypertension and continues carvedilol  and spironolactone  for  management.  She reports issues with her left leg, describing it as feeling numb and lacking energy, which makes it difficult to move. An ultrasound was performed and returned normal results, specifically no DVT given that she was off of her anticoagulation for several weeks prior to presenting with   She has been prescribed nicotine  patches 7 mg daily for tobacco cessation.  She has difficulty accessing her medical records due to a change in her phone number, which has affected her ability to manage appointments and prescriptions.   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 2019 s/p repair ([facility]).   Depression    Heart failure with mid-range ejection fraction (HCC)    a. 09/2023 Echo: EF 55-60%; b. 09/2023 Echo (post-op MVR): EF 25-30%; c. 12/2023 Echo:  EF 40-45%, glob HK, nl RV fxn, nl fxn'ing MV prosthesis, Asc Ao 40mm.   Hypertension    Mitral regurgitation    a. 09/2023 Echo: Severe MR; b. 09/2023 s/p a 29 mm SJM Prosthesis, PVI MAZE w/ occlusion of the LAA with a 50 mm Medtronic clip; b. 12/2023 Echo: No MR.   Morbid obesity (HCC)    NICM (nonischemic cardiomyopathy) (HCC)    a. 09/2023 Echo: EF  55-60%, severe MR; b. 09/2023 Cath: Nl cors; c. 09/2023 Echo (post-op MVR): EF 25-30%; d. 12/2023 Echo: EF 40-45%, glob HK, nl RV fxn, nl fxn'ing MV prosthesis, Asc Ao 40mm.   PAF (paroxysmal atrial fibrillation) (HCC)    a. Dx 09/2023.  CHA2DS2VASc = 3-->warfarin; b. On amio; c. 10/2023 DCCV (150J).   Severe mitral valve regurgitation 09/28/2023   Tobacco abuse       ROS     08/18/2024    1:03 PM 04/09/2024    9:20 AM 01/21/2024    8:17 AM  PHQ9 SCORE ONLY  PHQ-9 Total Score 23 22 20       Data saved with a previous flowsheet row definition      08/18/2024    1:03 PM 04/09/2024    9:20 AM 01/21/2024    8:18 AM 10/30/2023    1:33 PM  GAD 7 : Generalized Anxiety Score  Nervous, Anxious, on Edge 3 3  2  3    Control/stop worrying 3 3  3  3    Worry too much - different things 3 3  2  3    Trouble relaxing 3 3  2  3    Restless 2 2  1  3    Easily annoyed or irritable 3 2  3  3    Afraid - awful might happen 2 2  3  3    Total GAD 7 Score 19 18 16 21   Anxiety Difficulty  Very difficult Very difficult Extremely difficult     Data saved with a previous flowsheet row definition       Objective:     BP 125/85 (BP Location: Right Arm, Patient Position: Sitting, Cuff Size: Normal)   Pulse 85   Temp 97.7 F (36.5 C) (Oral)   Ht 5' 7 (1.702 m)   Wt 227 lb (103 kg)   SpO2 100%   BMI 35.55 kg/m    Physical Exam Vitals reviewed.  Constitutional:      General: She is not in acute distress.    Appearance: Normal appearance. She is normal weight. She is not ill-appearing, toxic-appearing or diaphoretic.     Comments: Well groomed, frequently tearful, tired appearing   Cardiovascular:     Rate and Rhythm: Normal rate and regular rhythm.     Heart sounds: Murmur heard.  Pulmonary:     Effort: Pulmonary effort is normal. No respiratory distress.     Breath sounds: No wheezing, rhonchi or rales.  Abdominal:     General: Bowel sounds are normal.     Palpations: Abdomen is soft.      Tenderness: There is no abdominal tenderness.  Musculoskeletal:     Right lower leg: No edema.     Left lower leg: No edema.  Neurological:     Mental Status: She is alert and oriented to person, place, and time.  Psychiatric:        Attention and Perception: Attention and perception normal. She is attentive. She does not perceive auditory or visual hallucinations.  Mood and Affect: Mood normal. Mood is not anxious. Affect is blunt. Affect is not tearful.        Speech: Speech normal.        Behavior: Behavior normal. Behavior is not agitated, slowed, aggressive, withdrawn, hyperactive or combative. Behavior is cooperative.        Thought Content: Thought content normal. Thought content is not paranoid or delusional. Thought content does not include homicidal or suicidal ideation. Thought content does not include homicidal or suicidal plan.        Judgment: Judgment normal.     Physical Exam     No results found for any visits on 08/18/24.  Last CBC Lab Results  Component Value Date   WBC 8.6 06/26/2024   HGB 13.1 06/26/2024   HCT 41.8 06/26/2024   MCV 92 06/26/2024   MCH 28.8 06/26/2024   RDW 13.7 06/26/2024   PLT 180 06/26/2024   Last metabolic panel Lab Results  Component Value Date   GLUCOSE 136 (H) 06/26/2024   NA 141 06/26/2024   K 4.0 06/26/2024   CL 101 06/26/2024   CO2 23 06/26/2024   BUN 17 06/26/2024   CREATININE 1.48 (H) 06/26/2024   EGFR 43 (L) 06/26/2024   CALCIUM  8.9 06/26/2024   PHOS 3.5 10/22/2023   PROT 7.5 04/09/2024   ALBUMIN  4.1 04/09/2024   LABGLOB 3.4 04/09/2024   BILITOT 0.4 04/09/2024   ALKPHOS 100 04/09/2024   AST 41 (H) 04/09/2024   ALT 58 (H) 04/09/2024   ANIONGAP 9 04/05/2024   Last lipids Lab Results  Component Value Date   CHOL 175 09/26/2023   HDL 41 09/26/2023   LDLCALC 118 (H) 09/26/2023   TRIG 79 09/26/2023   CHOLHDL 4.3 09/26/2023   Last hemoglobin A1c Lab Results  Component Value Date   HGBA1C 5.8 (H)  07/19/2023   Last thyroid  functions Lab Results  Component Value Date   TSH 0.263 (L) 04/09/2024   FREET4 1.82 (H) 04/09/2024      The ASCVD Risk score (Arnett DK, et al., 2019) failed to calculate for the following reasons:   Risk score cannot be calculated because patient has a medical history suggesting prior/existing ASCVD   * - Cholesterol units were assumed  Outpatient Encounter Medications as of 08/18/2024  Medication Sig   acetaminophen  (TYLENOL ) 325 MG tablet Take 2 tablets (650 mg total) by mouth every 4 (four) hours as needed for headache or mild pain (pain score 1-3).   amiodarone  (PACERONE ) 200 MG tablet Take 1 tablet (200 mg total) by mouth 2 (two) times daily.   aspirin  81 MG chewable tablet Chew 1 tablet (81 mg total) by mouth daily.   carvedilol  (COREG ) 3.125 MG tablet Take 1 tablet (3.125 mg total) by mouth 2 (two) times daily with a meal.   cyclobenzaprine  (FLEXERIL ) 5 MG tablet Take 1 tablet (5 mg total) by mouth 3 (three) times daily as needed for muscle spasms.   empagliflozin  (JARDIANCE ) 10 MG TABS tablet Take 1 tablet (10 mg total) by mouth daily before breakfast.   nicotine  (NICODERM CQ  - DOSED IN MG/24 HR) 7 mg/24hr patch Place 1 patch (7 mg total) onto the skin daily.   ondansetron  (ZOFRAN ) 4 MG tablet Take 1 tablet (4 mg total) by mouth every 8 (eight) hours as needed for nausea or vomiting.   polyethylene glycol powder (GLYCOLAX /MIRALAX ) 17 GM/SCOOP powder Take 17 g by mouth daily. Mix as directed.   rosuvastatin  (CRESTOR ) 40 MG tablet Take 1  tablet (40 mg total) by mouth daily.   sacubitril -valsartan  (ENTRESTO ) 24-26 MG Take 1 tablet by mouth 2 (two) times daily.   spironolactone  (ALDACTONE ) 25 MG tablet Take 0.5 tablets (12.5 mg total) by mouth daily.   torsemide  (DEMADEX ) 20 MG tablet Take 1 tablet (20 mg total) by mouth daily.   triamcinolone  (KENALOG ) 0.025 % ointment Apply 1 Application topically 2 (two) times daily.   warfarin (COUMADIN ) 2.5 MG tablet  Take 2 tablets by mouth daily or as directed by coumadin  clinic PLEASE SCHEDULE APPOINTMENT FOR MORE REFILLS   [DISCONTINUED] ALPRAZolam  (XANAX ) 0.5 MG tablet Take 1 tablet (0.5 mg total) by mouth 2 (two) times daily as needed for anxiety.   [DISCONTINUED] busPIRone  (BUSPAR ) 7.5 MG tablet Take 1 tablet (7.5 mg total) by mouth 2 (two) times daily.   [DISCONTINUED] DULoxetine  40 MG CPEP Take 1 capsule (40 mg total) by mouth daily.   [DISCONTINUED] SUMAtriptan  (IMITREX ) 25 MG tablet Take 1 tablet (25 mg total) by mouth every 2 (two) hours as needed for headache. May repeat in 2 hours if headache persists or recurs.   ALPRAZolam  (XANAX ) 0.5 MG tablet Take 1 tablet (0.5 mg total) by mouth 2 (two) times daily as needed for anxiety.   busPIRone  (BUSPAR ) 7.5 MG tablet Take 1 tablet (7.5 mg total) by mouth 2 (two) times daily.   DULoxetine  HCl 40 MG CPEP Take 1 capsule (40 mg total) by mouth daily.   SUMAtriptan  (IMITREX ) 25 MG tablet Take 1 tablet (25 mg total) by mouth every 2 (two) hours as needed for headache. May repeat in 2 hours if headache persists or recurs.   No facility-administered encounter medications on file as of 08/18/2024.       Assessment & Plan:   Problem List Items Addressed This Visit     A-fib (HCC)   Paroxysmal atrial fibrillation Chronic condition managed with amiodarone  and Coumadin . Recent lapse in warfarin due to pharmacy error, now corrected. - Continue amiodarone  200 mg twice daily - Restarted warfarin as prescribed by cardiologist -f/u with cardiology as scheduled       Chronic systolic heart failure (HCC)   Chronic systolic heart failure Chronic condition with reduced ejection fraction. Managed with multiple medications including Entresto , spironolactone , and torsemide . - Continue Entresto  24-26mg  twice daily - Continue spironolactone  12.5 mg daily - Continue torsemide  20 mg daily - continue to follow up with cardiology as scheduled       Chronic tension-type  headache, not intractable   Chronic headache syndrome Chronic condition. - Refilled Imitrex  25 mg as needed       Relevant Medications   DULoxetine  HCl 40 MG CPEP   SUMAtriptan  (IMITREX ) 25 MG tablet   Dyslipidemia   Hyperlipidemia Chronic condition managed with Crestor . - Continue Crestor  40 mg daily      GAD (generalized anxiety disorder)   Generalized anxiety disorder Chronic condition with a GAD-7 score of 19, indicating moderate severity. Recent stressors include housing insecurity and denial of disability benefits. - Refilled buspirone  7.5 mg twice daily - Continue Xanax  0.5 mg twice daily as needed      Relevant Medications   ALPRAZolam  (XANAX ) 0.5 MG tablet   busPIRone  (BUSPAR ) 7.5 MG tablet   DULoxetine  HCl 40 MG CPEP   Other Relevant Orders   AMB Referral [facility] Care Management   History of non-ST elevation myocardial infarction (NSTEMI)   Chronic condition  Continue ASA 81mg  daily  Continue coreg  3.125mg  BID       MDD (  major depressive disorder), single episode, moderate (HCC) - Primary   Major depressive disorder, single episode, moderate Chronic condition with a PHQ-9 score of 23, indicating moderate severity. Recent stressors include housing insecurity and denial of disability benefits. - Continue duloxetine  40 mg daily      Relevant Medications   ALPRAZolam  (XANAX ) 0.5 MG tablet   busPIRone  (BUSPAR ) 7.5 MG tablet   DULoxetine  HCl 40 MG CPEP   Other Relevant Orders   AMB Referral VBCI Care Management   Primary hypertension    Primary hypertension Chronic condition managed with carvedilol  and spironolactone . -Continue carvedilol  3.125 mg twice dailyChronic -continue spironolactone  12.5 mg daily  -continue Entresto  24-26 mg twice daily  - Monitor blood pressure at home       S/P MVR (mitral valve replacement)   Chronic  Stable  Murmur appreciated on exam today  Follow up with cardiology  Continue warfarin as directed by cardiology        Other Visit Diagnoses       Psychophysiological insomnia       Relevant Medications   ALPRAZolam  (XANAX ) 0.5 MG tablet     Other headache syndrome       Relevant Medications   DULoxetine  HCl 40 MG CPEP   SUMAtriptan  (IMITREX ) 25 MG tablet     Housing insecurity           Assessment and Plan Assessment & Plan  Psychophysiologic insomnia Chronic condition. No current medications for this problem    Housing insecurity  Food Insecurity  New problem  Recent eviction and denial of disability benefits. Currently staying with family and receiving some assistance from the . - Submitted urgent referral for community resources with referral [id number]  - Provided food pantry resources today   General health maintenance Due for hepatitis B surface antibody test and tetanus vaccination. Pneumococcal vaccination status unclear. - Ordered hepatitis B surface antibody test - recommended  tetanus vaccine - Pt reports having doses of pneumococcal vaccination, unable to confirm in vaccine records at this time    Return in about 3 months (around 11/16/2024) for Chronic F/U.    Rockie Agent, MD [facility]  "

## 2024-08-18 NOTE — Assessment & Plan Note (Signed)
 Hyperlipidemia Chronic condition managed with Crestor . - Continue Crestor  40 mg daily

## 2024-08-18 NOTE — Assessment & Plan Note (Signed)
 Chronic systolic heart failure Chronic condition with reduced ejection fraction. Managed with multiple medications including Entresto , spironolactone , and torsemide . - Continue Entresto  24-26mg  twice daily - Continue spironolactone  12.5 mg daily - Continue torsemide  20 mg daily - continue to follow up with cardiology as scheduled

## 2024-08-18 NOTE — Patient Instructions (Signed)
 To keep you healthy, please keep in mind the following health maintenance items that you are due for:   Health Maintenance Due  Topic Date Due   COVID-19 Vaccine (1) Never done   Pneumococcal Vaccine: 50+ Years (1 of 2 - PCV) Never done   Hepatitis B Vaccines 19-59 Average Risk (1 of 3 - 19+ 3-dose series) Never done   Zoster Vaccines- Shingrix (1 of 2) Never done   Cervical Cancer Screening (HPV/Pap Cotest)  Never done   Mammogram  Never done   Colonoscopy  Never done   DTaP/Tdap/Td (3 - Td or Tdap) 01/29/2022   Influenza Vaccine  Never done     Best Wishes,   Dr. Lang

## 2024-08-18 NOTE — Assessment & Plan Note (Signed)
 Chronic headache syndrome Chronic condition. - Refilled Imitrex  25 mg as needed

## 2024-08-18 NOTE — Assessment & Plan Note (Signed)
 Paroxysmal atrial fibrillation Chronic condition managed with amiodarone  and Coumadin . Recent lapse in warfarin due to pharmacy error, now corrected. - Continue amiodarone  200 mg twice daily - Restarted warfarin as prescribed by cardiologist -f/u with cardiology as scheduled

## 2024-08-18 NOTE — Assessment & Plan Note (Signed)
 Chronic condition  Continue ASA 81mg  daily  Continue coreg  3.125mg  BID

## 2024-08-18 NOTE — Assessment & Plan Note (Signed)
 Generalized anxiety disorder Chronic condition with a GAD-7 score of 19, indicating moderate severity. Recent stressors include housing insecurity and denial of disability benefits. - Refilled buspirone  7.5 mg twice daily - Continue Xanax  0.5 mg twice daily as needed

## 2024-08-18 NOTE — Assessment & Plan Note (Signed)
 Chronic  Stable  Murmur appreciated on exam today  Follow up with cardiology  Continue warfarin as directed by cardiology

## 2024-08-18 NOTE — Assessment & Plan Note (Signed)
" °  Primary hypertension Chronic condition managed with carvedilol  and spironolactone . -Continue carvedilol  3.125 mg twice dailyChronic -continue spironolactone  12.5 mg daily  -continue Entresto  24-26 mg twice daily  - Monitor blood pressure at home  "

## 2024-08-19 ENCOUNTER — Ambulatory Visit

## 2024-08-19 ENCOUNTER — Telehealth: Payer: Self-pay

## 2024-08-19 NOTE — Progress Notes (Signed)
 Complex Care Management Note  Care Guide Note 08/19/2024 Name: ONITA PFLUGER MRN: [id number] DOB: 1970-08-07  Zada KATHEE Childes is a 54 y.o. year old female who sees Simmons-Robinson, Rockie, MD for primary care. I reached out to Benna B Shane by phone today to offer complex care management services.  Ms. Defilippo was given information about Complex Care Management services today including:   The Complex Care Management services include support from the care team which includes your Nurse Care Manager, Clinical Social Worker, or Pharmacist.  The Complex Care Management team is here to help remove barriers to the health concerns and goals most important to you. Complex Care Management services are voluntary, and the patient may decline or stop services at any time by request to their care team member.   Complex Care Management Consent Status: Patient agreed to services and verbal consent obtained.   Follow up plan:  Telephone appointment with complex care management team member scheduled for:  08/31/24 @ 11 AM  Encounter Outcome:  Patient Scheduled  Leotis Rase [facility] Guide  Direct Dial: [phone]  Fax [phone]

## 2024-08-20 ENCOUNTER — Telehealth (HOSPITAL_COMMUNITY): Payer: Self-pay | Admitting: Licensed Clinical Social Worker

## 2024-08-20 NOTE — Telephone Encounter (Signed)
 CSW attempted to call pt to discuss SDOH concerns- unable to reach- left VM requesting return call  Andriette HILARIO Leech, LCSW Clinical Social Worker [facility] Desk#: [phone] Cell#: [phone]

## 2024-08-26 ENCOUNTER — Other Ambulatory Visit (HOSPITAL_COMMUNITY): Payer: Self-pay

## 2024-08-26 ENCOUNTER — Ambulatory Visit

## 2024-08-31 ENCOUNTER — Telehealth: Admitting: *Deleted

## 2024-09-25 ENCOUNTER — Ambulatory Visit: Admitting: Cardiovascular Disease

## 2024-11-24 ENCOUNTER — Ambulatory Visit: Payer: MEDICAID | Admitting: Family Medicine

## 2024-12-02 ENCOUNTER — Ambulatory Visit: Admitting: Cardiology
